# Patient Record
Sex: Male | Born: 1940
Health system: Southern US, Community
[De-identification: ages and names within clinical notes are randomized; demographics above are authoritative.]

## PROBLEM LIST (undated history)

## (undated) DIAGNOSIS — F419 Anxiety disorder, unspecified: Secondary | ICD-10-CM

## (undated) DIAGNOSIS — Z9889 Other specified postprocedural states: Secondary | ICD-10-CM

## (undated) DIAGNOSIS — C61 Malignant neoplasm of prostate: Secondary | ICD-10-CM

## (undated) DIAGNOSIS — Z8679 Personal history of other diseases of the circulatory system: Secondary | ICD-10-CM

## (undated) DIAGNOSIS — M1990 Unspecified osteoarthritis, unspecified site: Secondary | ICD-10-CM

## (undated) DIAGNOSIS — I509 Heart failure, unspecified: Secondary | ICD-10-CM

## (undated) DIAGNOSIS — E669 Obesity, unspecified: Secondary | ICD-10-CM

## (undated) DIAGNOSIS — I428 Other cardiomyopathies: Secondary | ICD-10-CM

## (undated) DIAGNOSIS — I2510 Atherosclerotic heart disease of native coronary artery without angina pectoris: Secondary | ICD-10-CM

## (undated) DIAGNOSIS — E785 Hyperlipidemia, unspecified: Secondary | ICD-10-CM

## (undated) DIAGNOSIS — I4891 Unspecified atrial fibrillation: Principal | ICD-10-CM

## (undated) DIAGNOSIS — R911 Solitary pulmonary nodule: Secondary | ICD-10-CM

## (undated) DIAGNOSIS — R0602 Shortness of breath: Secondary | ICD-10-CM

## (undated) DIAGNOSIS — I700 Atherosclerosis of aorta: Secondary | ICD-10-CM

## (undated) DIAGNOSIS — I499 Cardiac arrhythmia, unspecified: Secondary | ICD-10-CM

## (undated) DIAGNOSIS — I5042 Chronic combined systolic (congestive) and diastolic (congestive) heart failure: Secondary | ICD-10-CM

## (undated) DIAGNOSIS — I10 Essential (primary) hypertension: Secondary | ICD-10-CM

## (undated) DIAGNOSIS — K219 Gastro-esophageal reflux disease without esophagitis: Secondary | ICD-10-CM

## (undated) DIAGNOSIS — N189 Chronic kidney disease, unspecified: Secondary | ICD-10-CM

## (undated) HISTORY — DX: Unspecified atrial fibrillation: I48.91

## (undated) HISTORY — DX: Solitary pulmonary nodule: R91.1

## (undated) HISTORY — DX: Atherosclerosis of aorta: I70.0

## (undated) HISTORY — PX: MEDIAL PARTIAL KNEE REPLACEMENT: SHX5965

## (undated) HISTORY — DX: Other cardiomyopathies: I42.8

## (undated) HISTORY — DX: Hyperlipidemia, unspecified: E78.5

## (undated) HISTORY — DX: Atherosclerotic heart disease of native coronary artery without angina pectoris: I25.10

## (undated) HISTORY — DX: Chronic combined systolic (congestive) and diastolic (congestive) heart failure: I50.42

---

## 2003-02-11 ENCOUNTER — Encounter: Admission: RE | Admit: 2003-02-11 | Discharge: 2003-05-12 | Payer: Self-pay | Admitting: Family Medicine

## 2004-01-07 ENCOUNTER — Inpatient Hospital Stay (HOSPITAL_COMMUNITY): Admission: RE | Admit: 2004-01-07 | Discharge: 2004-01-09 | Payer: Self-pay | Admitting: Orthopedic Surgery

## 2007-01-27 ENCOUNTER — Emergency Department (HOSPITAL_COMMUNITY): Admission: EM | Admit: 2007-01-27 | Discharge: 2007-01-27 | Payer: Self-pay | Admitting: Emergency Medicine

## 2008-02-28 HISTORY — PX: TRANSURETHRAL RESECTION OF PROSTATE: SHX73

## 2008-03-10 ENCOUNTER — Encounter: Payer: Self-pay | Admitting: Urology

## 2008-03-11 ENCOUNTER — Inpatient Hospital Stay (HOSPITAL_COMMUNITY): Admission: RE | Admit: 2008-03-11 | Discharge: 2008-03-12 | Payer: Self-pay | Admitting: Urology

## 2008-05-30 ENCOUNTER — Emergency Department (HOSPITAL_COMMUNITY): Admission: EM | Admit: 2008-05-30 | Discharge: 2008-05-31 | Payer: Self-pay | Admitting: Emergency Medicine

## 2008-05-31 ENCOUNTER — Emergency Department (HOSPITAL_COMMUNITY): Admission: EM | Admit: 2008-05-31 | Discharge: 2008-05-31 | Payer: Self-pay | Admitting: Emergency Medicine

## 2010-06-08 LAB — URINALYSIS, ROUTINE W REFLEX MICROSCOPIC
Bilirubin Urine: NEGATIVE
Glucose, UA: NEGATIVE mg/dL
Ketones, ur: NEGATIVE mg/dL
Nitrite: NEGATIVE
Protein, ur: NEGATIVE mg/dL
Specific Gravity, Urine: 1.006 (ref 1.005–1.030)
Urobilinogen, UA: 0.2 mg/dL (ref 0.0–1.0)
pH: 6 (ref 5.0–8.0)

## 2010-06-08 LAB — URINE CULTURE
Colony Count: NO GROWTH
Culture: NO GROWTH

## 2010-06-08 LAB — URINE MICROSCOPIC-ADD ON

## 2010-06-13 LAB — BASIC METABOLIC PANEL
BUN: 10 mg/dL (ref 6–23)
BUN: 8 mg/dL (ref 6–23)
CO2: 30 mEq/L (ref 19–32)
CO2: 30 mEq/L (ref 19–32)
Calcium: 8.5 mg/dL (ref 8.4–10.5)
Calcium: 9.4 mg/dL (ref 8.4–10.5)
Chloride: 103 mEq/L (ref 96–112)
Chloride: 99 mEq/L (ref 96–112)
Creatinine, Ser: 0.9 mg/dL (ref 0.4–1.5)
Creatinine, Ser: 1.01 mg/dL (ref 0.4–1.5)
GFR calc Af Amer: >60 mL/min (ref >60)
GFR calc Af Amer: >60 mL/min (ref >60)
GFR calc non Af Amer: >60 mL/min (ref >60)
GFR calc non Af Amer: >60 mL/min (ref >60)
Glucose, Bld: 112 mg/dL — ABNORMAL HIGH (ref 70–99)
Glucose, Bld: 117 mg/dL — ABNORMAL HIGH (ref 70–99)
Potassium: 4.1 mEq/L (ref 3.5–5.1)
Potassium: 4.9 mEq/L (ref 3.5–5.1)
Sodium: 136 mEq/L (ref 135–145)
Sodium: 137 mEq/L (ref 135–145)

## 2010-06-13 LAB — HEMOGLOBIN AND HEMATOCRIT, BLOOD
HCT: 42.7 % (ref 39.0–52.0)
Hemoglobin: 14.9 g/dL (ref 13.0–17.0)

## 2010-07-12 NOTE — Op Note (Signed)
NAME:  Jose Snyder, Jose Snyder                  ACCOUNT NO.:  [id number]   MEDICAL RECORD NO.:  [id number]          PATIENT TYPE:  OIB   LOCATION:  1403                         FACILITY:  [facility]   PHYSICIAN:  Excell Seltzer. Annabell Howells, M.D.    DATE OF BIRTH:  24-Mar-1940   DATE OF PROCEDURE:  03/10/2008  DATE OF DISCHARGE:                               OPERATIVE REPORT   PROCEDURE:  Circumcision and transurethral resection of the prostate.   PREOPERATIVE DIAGNOSIS:  Phimosis and benign prostatic hypertrophy with  bladder obstruction.   POSTOPERATIVE DIAGNOSIS:  Phimosis and benign prostatic hypertrophy with  bladder obstruction.   SURGEON:  Dr. Bjorn Pippin.   ANESTHESIA:  Spinal.   SPECIMEN:  Prostate chips.   BLOOD LOSS:  500 mL.   DRAIN:  22-French three-way Foley catheter.   COMPLICATIONS:  None.   INDICATIONS:  Jose Snyder is a 70 year old white male with BPH with bladder  outlet obstruction who has failed medical therapy.  He also has severe  phimosis.  He is to undergo circumcision and TURP.   FINDINGS AND PROCEDURE:  The patient is given Cipro.  He was taken to  the operating room.  Spinal anesthetic was induced.  He was fitted with  PAS hose and placed in lithotomy position.  His perineum and genitalia  were prepped with Betadine solution.  He was draped in usual sterile  fashion.   A circumcision of was performed initially.  A dorsal slit was created  and the redundant wings of foreskin were excised.  Hemostasis was  achieved with a Bovie and the skin edges were reapproximated with  running 4-0 chromic locked in the quadrants.   Cystoscopy was performed with a 22-French scope.  Examination revealed a  normal urethra.  The external sphincter was intact.  The prostatic  urethra was elongated approximately 4 cm.  There was intravesical  extension.  Middle lobe was not clearly identified.  The bladder wall  had mild to moderate trabeculation.  No tumors or stones were noted.  Ureteral orifices  were unremarkable and well way from the bladder neck.   After completion of cystoscopy, the urethra was calibrated to 32-French  with Sissy Hoff sounds.  A 28-French continuous flow resectoscope sheath  with obturator was placed without difficulty.  This was fitted with  Wandra Scot handle 12 degrees lens and 26th loupe.  Resection of the  prostate was then performed beginning with the bladder neck where the  fibers were exposed from 5 to 7 o'clock.  The floor of the prostate was  then resected out to and alongside the verumontanum.  The left lobe of  the prostate was taken down from bladder neck to apex out to the  capsular fibers and the resection was then swept down to the floor.  This was then repeated on the right.  The chips were evacuated and some  residual apical and anterior tissue was then resected.  Once adequate  resection had been achieved and the bladder was evacuated free of chips,  the ureteral orifices were noted to be intact.  The prostatic  fossa was  inspected and no active bleeding was identified.  The scope was removed.  Pressure on the bladder produced an excellent stream.  A 22-French three-  way Foley catheter was inserted with the aid of a catheter guide.  The  balloon was filled with 30 mL sterile fluid.  The catheter was placed on  manual traction and irrigated by hand until clear.  It was then placed  to continuous irrigation and straight drainage.  Neosporin ointment was  applied to the circumcision site.  The patient had had some glanular  adhesions that require lysis during the procedure and had some raw areas  on the glans.  These were then covered by Xeroform gauze and a light  Kling dressing.  A penile block was then performed with 8 mL of 0.25%  Marcaine.  The patient was taken down from lithotomy position after the  drapes removed.  He was moved to the recovery room in stable condition.  There were no complications.      Excell Seltzer. Annabell Howells, M.D.   Electronically Signed     JJW/MEDQ  D:  03/10/2008  T:  03/10/2008  Job:  [id number]

## 2010-07-15 NOTE — Discharge Summary (Signed)
NAME:  Jose Snyder, Jose Snyder                  ACCOUNT NO.:  [id number]   MEDICAL RECORD NO.:  [id number]          PATIENT TYPE:  INP   LOCATION:  5018                         FACILITY:  [facility]   PHYSICIAN:  Loreta Ave, M.D. DATE OF BIRTH:  09/11/1940   DATE OF ADMISSION:  01/07/2004  DATE OF DISCHARGE:  01/09/2004                                 DISCHARGE SUMMARY   No dictation      RWC/MEDQ  D:  03/28/2004  T:  03/28/2004  Job:  [id number]

## 2010-07-15 NOTE — Op Note (Signed)
NAME:  Jose Snyder, Jose Snyder                  ACCOUNT NO.:  [id number]   MEDICAL RECORD NO.:  [id number]          PATIENT TYPE:  INP   LOCATION:  5018                         FACILITY:  [facility]   PHYSICIAN:  Loreta Ave, M.D. DATE OF BIRTH:  12-Feb-1941   DATE OF PROCEDURE:  01/07/2004  DATE OF DISCHARGE:                                 OPERATIVE REPORT   PREOPERATIVE DIAGNOSIS:  Avascular necrosis medial femoral condyle with  resultant degenerative arthritis, medial aspect, left knee, mild flexion  contracture and varus alignment.   POSTOPERATIVE DIAGNOSIS:  Avascular necrosis medial femoral condyle with  resultant degenerative arthritis, medial aspect, left knee, mild flexion  contracture and varus alignment with the other compartments free of  significant degenerative change.   OPERATIVE PROCEDURE:  Arthrotomy left knee followed by unicompartmental  replacement, medial compartment, Osteonix prosthesis, cemented large left  medial femoral component, cemented 8 mm medium tibial component, soft tissue  balancing with medial release.   SURGEON:  Loreta Ave, M.D.   ASSISTANT:  Genene Churn. Owens, P.A.-C.   ANESTHESIA:  General.   ESTIMATED BLOOD LOSS:  Minimal.   SPECIMENS:  Excised bone and soft tissue.   TOURNIQUET TIME:  2 hours.   DRAINS:  One Hemovac.   COMPLICATIONS:  None.   DRESSINGS:  Soft compressive with knee immobilizer.   PROCEDURE:  The patient was brought to the operating room and after adequate  anesthesia had been obtained, the left knee was examined.  Mild flexion  contracture, varus alignment, stable ligaments, full flexion.  Tourniquet  applied, prepped and draped in the usual sterile fashion.  Exsanguination  with elevation of Esmarch and tourniquet inflated to 350 mmHg.  An incision  from the top of the patella medial aspect down next to the tibial tubercle.  The skin and subcutaneous tissues were divided.  Medial parapatellar  arthrotomy.  Medial  capsule release.  Grade 4 changes with avascular  necrosis and collapsed medial femoral condyle.  Lateral compartment, lateral  meniscus, patellofemoral joint, all looked excellent.  We proceeded with  unicompartmental replacement.  Remnants of menisci were removed.  Medial  capsule release to balance the knee.  Tibial jig, extramedullary guide  placed, resecting 8 mm out medially.  Care taken not to under cut the tibial  eminence.  Sized for a medium component.  Femur was then addressed with the  appropriate femoral jigs.  Sized for a large component going anterior to the  tide mark.  Jigs put in place and the bed for the component prepared with  drills and burs.  When that was complete and all periarticular spurs were  removed, the knee was irrigated.  Posterior cut had been made in the femur  for the component and all posterior spurs were removed.  Trials were put in  place.  Large on the femur, medium on the tibia.  With the 8 mm tibial  insert, I had full extension, full flexion, nicely balanced knee, set at 5  degrees of valgus.  No lift off with flexion.  Tibia was marked for  placement and  reamed for the keel of the component.  Copious irrigation with  the pulse irrigating device.  Cement placed on all components which were  firmly seated.  Excess cement removed.  The knee was reduced.  Once the  cement hardened, the knee was re-examined.  Full extension, full flexion,  good stability, good tracking of all components.  The wound was irrigated.  Arthrotomy closed with #1 Vicryl, skin and subcutaneous tissue with Vicryl  and staples.  Margins of the wound and knee injected with Marcaine.  A  Hemovac had been placed before closure of the wound.  Sterile compressive  dressing applied.  Tourniquet deflated and removed.  Knee immobilizer  applied.  Anesthesia reversed.  Brought to the recovery room.  No  complications.      [name]   DFM/MEDQ  D:  01/07/2004  T:  01/07/2004  Job:   [id number]

## 2010-07-15 NOTE — Discharge Summary (Signed)
NAME:  Jose Snyder, Jose Snyder                  ACCOUNT NO.:  [id number]   MEDICAL RECORD NO.:  [id number]          PATIENT TYPE:  INP   LOCATION:  5018                         FACILITY:  [facility]   PHYSICIAN:  Loreta Ave, M.D. DATE OF BIRTH:  [birth year]   DATE OF ADMISSION:  01/07/2004  DATE OF DISCHARGE:  01/09/2004                                 DISCHARGE SUMMARY   DISCHARGE DIAGNOSES:  1.  Status post left medial knee unicompartmental arthroplasty for medial      compartment avascular necrosis/degenerative joint disease.  2.  Long-term use of anticoagulants.  3.  Hypertension.   LABORATORY DATA AND X-RAY FINDINGS:  Preadmission labs with WBC 6.9,  hemoglobin 15.5, hematocrit 43.7, platelets 345.  PT 13.0, INR 1.0, PTT 38.  Sodium 136, potassium 4.7, chloride 101, CO2 27, glucose 97, BUN 12,  creatinine 1.0, calcium 9.7.  Total protein 7.3, albumin 4.3, AST 23, ALT  28, Alk phos 91, total bilirubin 0.5.  UA negative.   HISTORY OF PRESENT ILLNESS:  This is a 70 year old, white male with a  history of left medial knee bone on bone changes with progressive worsening  of pain presenting to our office.  He had progressive worsening symptoms  with failed response to conservative treatment.  Significant decrease in his  daily activities due to the ongoing complaint.  Preop x-rays showed medial  compartment bone on bone changes with spurring of the lateral and  patellofemoral compartments.   HOSPITAL COURSE:  On January 07, 2004, the patient was taken to the [facility] Operating Room and a left, medial knee, unicompartmental arthroplasty  procedure performed.  Surgeon was Dr. Mckinley Jewel.  Anesthesia was  general.  EBL was minimal.  Tourniquet time was 2 hours.  One Hemovac drain  was placed.  There were no surgical or anesthesia complications.  The  patient was transferred to recovery in stable condition.  On January 08, 2004, the patient had good pain control.  No specific complaints  noted.  Temperature was 97.8, pulse 82, respirations 20, blood pressure 139/77.  Hemoglobin was 12.8, WBC 7.2 and glucose 126.  Dressing was clean, dry and  intact.  Hemovac drain was pulled.  PT consult was ordered.  Discontinued O2  and Foley.  Started pharmacy protocol on Coumadin.  On January 09, 2004,  the patient was doing well with no specific complaints.  Pain was well-  controlled.  Vital signs stable and afebrile.  Dressing was clean, dry and  intact.  The patient was doing well and ready for discharge home.   DISPOSITION:  Discharged to home.   DISCHARGE MEDICATIONS:  1.  Percocet one to two tablets p.o. q.4-6h. p.r.n. pain.  2.  Coumadin per pharmacy protocol.  3.  Resume pervious home medications.   CONDITION ON DISCHARGE:  Good and stable.   SPECIAL INSTRUCTIONS:  The patient will work with home health physical  therapy to improve the range of motion and strengthening.  These staples to  be removed 2 weeks postop.  Will continue Coumadin for DVT prophylaxis x3-4  weeks  postop.  Dressing changes p.r.n.   FOLLOW UP:  Will follow up in the office in 2 weeks for recheck.  Return  sooner if needed.      DFM/MEDQ  D:  04/04/2004  T:  04/04/2004  Job:  [id number]

## 2010-12-06 LAB — URINALYSIS, ROUTINE W REFLEX MICROSCOPIC
Bilirubin Urine: NEGATIVE
Glucose, UA: NEGATIVE
Hgb urine dipstick: NEGATIVE
Ketones, ur: NEGATIVE
Leukocytes, UA: NEGATIVE
Nitrite: NEGATIVE
Protein, ur: 30 — AB
Specific Gravity, Urine: 1.012
Urobilinogen, UA: 0.2
pH: 6.5

## 2010-12-06 LAB — URINE MICROSCOPIC-ADD ON

## 2011-07-19 DIAGNOSIS — C61 Malignant neoplasm of prostate: Secondary | ICD-10-CM | POA: Diagnosis not present

## 2011-07-26 DIAGNOSIS — C61 Malignant neoplasm of prostate: Secondary | ICD-10-CM | POA: Diagnosis not present

## 2011-07-26 DIAGNOSIS — N476 Balanoposthitis: Secondary | ICD-10-CM | POA: Diagnosis not present

## 2011-11-23 DIAGNOSIS — Z23 Encounter for immunization: Secondary | ICD-10-CM | POA: Diagnosis not present

## 2012-01-22 DIAGNOSIS — C61 Malignant neoplasm of prostate: Secondary | ICD-10-CM | POA: Diagnosis not present

## 2012-01-29 DIAGNOSIS — N476 Balanoposthitis: Secondary | ICD-10-CM | POA: Diagnosis not present

## 2012-01-29 DIAGNOSIS — C61 Malignant neoplasm of prostate: Secondary | ICD-10-CM | POA: Diagnosis not present

## 2012-07-29 DIAGNOSIS — C61 Malignant neoplasm of prostate: Secondary | ICD-10-CM | POA: Diagnosis not present

## 2012-08-05 DIAGNOSIS — C61 Malignant neoplasm of prostate: Secondary | ICD-10-CM | POA: Diagnosis not present

## 2012-08-05 DIAGNOSIS — R972 Elevated prostate specific antigen [PSA]: Secondary | ICD-10-CM | POA: Diagnosis not present

## 2012-08-05 DIAGNOSIS — N476 Balanoposthitis: Secondary | ICD-10-CM | POA: Diagnosis not present

## 2012-08-05 DIAGNOSIS — N529 Male erectile dysfunction, unspecified: Secondary | ICD-10-CM | POA: Diagnosis not present

## 2012-12-11 DIAGNOSIS — Z23 Encounter for immunization: Secondary | ICD-10-CM | POA: Diagnosis not present

## 2013-01-01 ENCOUNTER — Ambulatory Visit: Payer: Medicare Other | Admitting: Podiatry

## 2013-01-01 DIAGNOSIS — B351 Tinea unguium: Secondary | ICD-10-CM

## 2013-01-01 DIAGNOSIS — M79609 Pain in unspecified limb: Secondary | ICD-10-CM

## 2013-01-01 NOTE — Progress Notes (Signed)
Subjective:     Patient ID: Jose Snyder, male   DOB: [birth year], 72 y.o.   MRN: [id number]  HPI patient states I have a lot of nail disease they are crumbled thick and they bother me. I will do anything to try to get him better I have tried oral medicine in the past without success   Review of Systems  All other systems reviewed and are negative.       Objective:   Physical Exam  Nursing note and vitals reviewed. Constitutional: He is oriented to person, place, and time.  Cardiovascular: Intact distal pulses.   Musculoskeletal: Normal range of motion.  Neurological: He is oriented to person, place, and time.  Skin: Skin is warm.   patient is found to have thick crumbled nails 1-5 both feet with pain and deformity of the beds noted     Assessment:     Mycotic nail infection with pain 1-5 both feet    Plan:     H&P done in condition reviewed today I debrided the nailbeds and discussed topical treatment with formula 3 dispensed. Do not recommend oral therapy at the time patient will reappoint in 3 months

## 2013-01-01 NOTE — Progress Notes (Signed)
N-THICK, DISCOLORATION L-B/L TOENAILS D-3 YEARS O-SLOWLY C-WORSE A-N/A T-TRIM

## 2013-01-01 NOTE — Progress Notes (Signed)
  Subjective:    Patient ID: Jose Snyder, male    DOB: Sep 01, 1940, 72 y.o.   MRN: [id number]  HPI    Review of Systems  All other systems reviewed and are negative.       Objective:   Physical Exam        Assessment & Plan:

## 2013-01-01 NOTE — Patient Instructions (Signed)
Use topical once a day

## 2013-02-03 DIAGNOSIS — C61 Malignant neoplasm of prostate: Secondary | ICD-10-CM | POA: Diagnosis not present

## 2013-02-10 DIAGNOSIS — N529 Male erectile dysfunction, unspecified: Secondary | ICD-10-CM | POA: Diagnosis not present

## 2013-02-10 DIAGNOSIS — N476 Balanoposthitis: Secondary | ICD-10-CM | POA: Diagnosis not present

## 2013-02-10 DIAGNOSIS — C61 Malignant neoplasm of prostate: Secondary | ICD-10-CM | POA: Diagnosis not present

## 2013-02-22 ENCOUNTER — Emergency Department (HOSPITAL_BASED_OUTPATIENT_CLINIC_OR_DEPARTMENT_OTHER)
Admission: EM | Admit: 2013-02-22 | Discharge: 2013-02-22 | Disposition: A | Discharge status: Home / Self Care | Payer: Medicare Other | Attending: Emergency Medicine | Admitting: Emergency Medicine

## 2013-02-22 ENCOUNTER — Emergency Department (HOSPITAL_BASED_OUTPATIENT_CLINIC_OR_DEPARTMENT_OTHER): Payer: Medicare Other

## 2013-02-22 ENCOUNTER — Encounter (HOSPITAL_BASED_OUTPATIENT_CLINIC_OR_DEPARTMENT_OTHER): Payer: Self-pay | Admitting: Emergency Medicine

## 2013-02-22 DIAGNOSIS — R509 Fever, unspecified: Secondary | ICD-10-CM | POA: Insufficient documentation

## 2013-02-22 DIAGNOSIS — Z8546 Personal history of malignant neoplasm of prostate: Secondary | ICD-10-CM | POA: Insufficient documentation

## 2013-02-22 DIAGNOSIS — J019 Acute sinusitis, unspecified: Secondary | ICD-10-CM | POA: Diagnosis not present

## 2013-02-22 DIAGNOSIS — Z79899 Other long term (current) drug therapy: Secondary | ICD-10-CM | POA: Diagnosis not present

## 2013-02-22 DIAGNOSIS — I10 Essential (primary) hypertension: Secondary | ICD-10-CM | POA: Diagnosis not present

## 2013-02-22 DIAGNOSIS — J069 Acute upper respiratory infection, unspecified: Secondary | ICD-10-CM | POA: Diagnosis not present

## 2013-02-22 DIAGNOSIS — R6883 Chills (without fever): Secondary | ICD-10-CM | POA: Diagnosis not present

## 2013-02-22 DIAGNOSIS — R05 Cough: Secondary | ICD-10-CM | POA: Diagnosis not present

## 2013-02-22 HISTORY — DX: Essential (primary) hypertension: I10

## 2013-02-22 HISTORY — DX: Malignant neoplasm of prostate: C61

## 2013-02-22 MED ORDER — AMOXICILLIN 500 MG PO CAPS: 500.0000 mg | ORAL_CAPSULE | Freq: Three times a day (TID) | ORAL | Status: DC

## 2013-02-22 NOTE — ED Notes (Signed)
Patient here with cough, congestion, general aching and chills x 4 days. No shortness of breath, no extremity swelling.

## 2013-02-22 NOTE — ED Notes (Signed)
MD at bedside. 

## 2013-02-22 NOTE — ED Notes (Signed)
Patient transported to X-ray 

## 2013-02-22 NOTE — ED Provider Notes (Signed)
CSN: [id number]     Arrival date & time 02/22/13  0907 History   First MD Initiated Contact with Patient 02/22/13 [phone]     Chief Complaint  Patient presents with  . Nasal Congestion  . Cough   (Consider location/radiation/quality/duration/timing/severity/associated sxs/prior Treatment) HPI Comments: Patient is a 72 year old male with past medical history of hypertension. Presents today with a five-day history of chest congestion, cough, nasal congestion, and sinus pain. He has felt fevered intermittently. He denies chest pain or shortness of breath.  Patient is a 72 y.o. male presenting with cough. The history is provided by the patient.  Cough Cough characteristics:  Non-productive Severity:  Moderate Onset quality:  Gradual Duration:  5 days Timing:  Constant Progression:  Worsening Chronicity:  New Smoker: no   Relieved by:  Nothing Worsened by:  Nothing tried Ineffective treatments:  None tried   Past Medical History  Diagnosis Date  . Hypertension   . Prostate cancer    History reviewed. No pertinent past surgical history. No family history on file. History  Substance Use Topics  . Smoking status: Never Smoker   . Smokeless tobacco: Not on file  . Alcohol Use: Not on file    Review of Systems  Respiratory: Positive for cough.   All other systems reviewed and are negative.    Allergies  Review of patient's allergies indicates no known allergies.  Home Medications   Current Outpatient Rx  Name  Route  Sig  Dispense  Refill  . amLODipine (NORVASC) 10 MG tablet   Oral   Take 10 mg by mouth daily.         . flunisolide (NASALIDE) 25 MCG/ACT (0.025%) SOLN   Nasal   Place 2 sprays into the nose 2 (two) times daily.         . hydrochlorothiazide (MICROZIDE) 12.5 MG capsule   Oral   Take 12.5 mg by mouth daily.         Marland Kitchen ibuprofen (ADVIL,MOTRIN) 800 MG tablet   Oral   Take 800 mg by mouth every 8 (eight) hours as needed.         Marland Kitchen omeprazole  (PRILOSEC) 20 MG capsule   Oral   Take 20 mg by mouth daily.         Marland Kitchen zolpidem (AMBIEN) 10 MG tablet   Oral   Take 10 mg by mouth at bedtime as needed for sleep.         Marland Kitchen docusate sodium (COLACE) 100 MG capsule   Oral   Take 100 mg by mouth 2 (two) times daily.          BP 102/62  Pulse 108  Temp(Src) 97.5 F (36.4 C) (Oral)  Resp 18  Ht 6\' 2"  (1.88 m)  Wt 240 lb (108.863 kg)  BMI 30.80 kg/m2  SpO2 100% Physical Exam  Nursing note and vitals reviewed. Constitutional: He is oriented to person, place, and time. He appears well-developed and well-nourished. No distress.  HENT:  Head: Normocephalic and atraumatic.  Mouth/Throat: Oropharynx is clear and moist.  Bilateral TMs are clear.  Neck: Normal range of motion. Neck supple.  Cardiovascular: Normal rate, regular rhythm and normal heart sounds.   No murmur heard. Pulmonary/Chest: Effort normal and breath sounds normal. No respiratory distress. He has no wheezes.  Abdominal: Soft. Bowel sounds are normal.  Musculoskeletal: Normal range of motion. He exhibits no edema.  Lymphadenopathy:    He has no cervical adenopathy.  Neurological: He is alert  and oriented to person, place, and time.  Skin: Skin is warm and dry. He is not diaphoretic.    ED Course  Procedures (including critical care time) Labs Review Labs Reviewed - No data to display Imaging Review Dg Chest 2 View  02/22/2013   CLINICAL DATA:  Cough and chills  EXAM: CHEST  2 VIEW  COMPARISON:  January 04, 2004  FINDINGS: The lungs are clear. The heart size and pulmonary vascularity are normal. No adenopathy. There is mild degenerative change in the thoracic spine.  IMPRESSION: No edema or consolidation.   Electronically Signed   By: Bretta Bang M.D.   On: 02/22/2013 09:39    EKG Interpretation   None       MDM  No diagnosis found. Patient reports sinus congestion and is convinced he has a sinus infection. I discussed with him that this is  most likely viral in nature however he is confident he will require an antibiotic. I will prescribe amoxicillin and is to followup as needed for any problems. His chest x-ray is clear and shows no evidence for pneumonia.    Geoffery Lyons, MD 02/22/13 1001

## 2013-04-09 ENCOUNTER — Ambulatory Visit: Payer: Medicare Other | Admitting: Podiatry

## 2013-04-09 ENCOUNTER — Encounter: Payer: Self-pay | Admitting: Podiatry

## 2013-04-09 VITALS — BP 156/96 | HR 108 | Resp 16

## 2013-04-09 DIAGNOSIS — B351 Tinea unguium: Secondary | ICD-10-CM

## 2013-04-09 DIAGNOSIS — M79609 Pain in unspecified limb: Secondary | ICD-10-CM

## 2013-04-11 NOTE — Progress Notes (Signed)
Subjective:     Patient ID: Jose Snyder, male   DOB: [birth year], 73 y.o.   MRN: [id number]  HPI patient states nails are thick 1-5 both feet are responding well to topical antifungal. Still painful and he cannot cut   Review of Systems     Objective:   Physical Exam Mycotic nail infection with pain 1-5 both feet that are somewhat thinner than previous visit    Assessment:     Chronic mycotic infection with pain 1-5 both feet    Plan:     Continue formula 3 and debridement nailbeds 1-5 both feet with no bleeding noted

## 2013-05-12 DIAGNOSIS — I781 Nevus, non-neoplastic: Secondary | ICD-10-CM | POA: Diagnosis not present

## 2013-05-12 DIAGNOSIS — L570 Actinic keratosis: Secondary | ICD-10-CM | POA: Diagnosis not present

## 2013-05-12 DIAGNOSIS — L723 Sebaceous cyst: Secondary | ICD-10-CM | POA: Diagnosis not present

## 2013-06-25 ENCOUNTER — Emergency Department (HOSPITAL_BASED_OUTPATIENT_CLINIC_OR_DEPARTMENT_OTHER): Payer: Medicare Other

## 2013-06-25 ENCOUNTER — Inpatient Hospital Stay (HOSPITAL_BASED_OUTPATIENT_CLINIC_OR_DEPARTMENT_OTHER)
Admission: EM | Admit: 2013-06-25 | Discharge: 2013-07-01 | DRG: 286 | Disposition: A | Discharge status: Home / Self Care | Payer: Medicare Other | Attending: Cardiovascular Disease | Admitting: Cardiovascular Disease

## 2013-06-25 ENCOUNTER — Encounter (HOSPITAL_BASED_OUTPATIENT_CLINIC_OR_DEPARTMENT_OTHER): Payer: Self-pay | Admitting: Emergency Medicine

## 2013-06-25 DIAGNOSIS — K219 Gastro-esophageal reflux disease without esophagitis: Secondary | ICD-10-CM | POA: Diagnosis not present

## 2013-06-25 DIAGNOSIS — I429 Cardiomyopathy, unspecified: Secondary | ICD-10-CM

## 2013-06-25 DIAGNOSIS — I4891 Unspecified atrial fibrillation: Secondary | ICD-10-CM | POA: Diagnosis not present

## 2013-06-25 DIAGNOSIS — I428 Other cardiomyopathies: Secondary | ICD-10-CM | POA: Diagnosis not present

## 2013-06-25 DIAGNOSIS — I10 Essential (primary) hypertension: Secondary | ICD-10-CM | POA: Diagnosis present

## 2013-06-25 DIAGNOSIS — Z6831 Body mass index (BMI) 31.0-31.9, adult: Secondary | ICD-10-CM

## 2013-06-25 DIAGNOSIS — R079 Chest pain, unspecified: Secondary | ICD-10-CM

## 2013-06-25 DIAGNOSIS — N179 Acute kidney failure, unspecified: Secondary | ICD-10-CM | POA: Diagnosis present

## 2013-06-25 DIAGNOSIS — C61 Malignant neoplasm of prostate: Secondary | ICD-10-CM | POA: Diagnosis not present

## 2013-06-25 DIAGNOSIS — I5041 Acute combined systolic (congestive) and diastolic (congestive) heart failure: Secondary | ICD-10-CM | POA: Diagnosis not present

## 2013-06-25 DIAGNOSIS — R7309 Other abnormal glucose: Secondary | ICD-10-CM | POA: Diagnosis present

## 2013-06-25 DIAGNOSIS — E669 Obesity, unspecified: Secondary | ICD-10-CM | POA: Diagnosis not present

## 2013-06-25 DIAGNOSIS — R0609 Other forms of dyspnea: Secondary | ICD-10-CM

## 2013-06-25 DIAGNOSIS — I2510 Atherosclerotic heart disease of native coronary artery without angina pectoris: Secondary | ICD-10-CM | POA: Diagnosis present

## 2013-06-25 DIAGNOSIS — F411 Generalized anxiety disorder: Secondary | ICD-10-CM | POA: Diagnosis not present

## 2013-06-25 DIAGNOSIS — I509 Heart failure, unspecified: Secondary | ICD-10-CM | POA: Diagnosis present

## 2013-06-25 DIAGNOSIS — R0602 Shortness of breath: Secondary | ICD-10-CM | POA: Diagnosis not present

## 2013-06-25 DIAGNOSIS — I059 Rheumatic mitral valve disease, unspecified: Secondary | ICD-10-CM | POA: Diagnosis not present

## 2013-06-25 DIAGNOSIS — E876 Hypokalemia: Secondary | ICD-10-CM | POA: Diagnosis not present

## 2013-06-25 DIAGNOSIS — Z96659 Presence of unspecified artificial knee joint: Secondary | ICD-10-CM | POA: Diagnosis not present

## 2013-06-25 DIAGNOSIS — Z8546 Personal history of malignant neoplasm of prostate: Secondary | ICD-10-CM | POA: Diagnosis not present

## 2013-06-25 DIAGNOSIS — I5042 Chronic combined systolic (congestive) and diastolic (congestive) heart failure: Secondary | ICD-10-CM | POA: Diagnosis present

## 2013-06-25 DIAGNOSIS — R0600 Dyspnea, unspecified: Secondary | ICD-10-CM | POA: Diagnosis present

## 2013-06-25 HISTORY — DX: Obesity, unspecified: E66.9

## 2013-06-25 HISTORY — DX: Gastro-esophageal reflux disease without esophagitis: K21.9

## 2013-06-25 HISTORY — DX: Unspecified atrial fibrillation: I48.91

## 2013-06-25 LAB — CBC WITH DIFFERENTIAL/PLATELET
Basophils Absolute: 0 10*3/uL (ref 0.0–0.1)
Basophils Relative: 0 % (ref 0–1)
Eosinophils Absolute: 0 10*3/uL (ref 0.0–0.7)
Eosinophils Relative: 0 % (ref 0–5)
HCT: 44 % (ref 39.0–52.0)
Hemoglobin: 15.1 g/dL (ref 13.0–17.0)
Lymphocytes Relative: 10 % — ABNORMAL LOW (ref 12–46)
Lymphs Abs: 0.8 10*3/uL (ref 0.7–4.0)
MCH: 32.1 pg (ref 26.0–34.0)
MCHC: 34.3 g/dL (ref 30.0–36.0)
MCV: 93.4 fL (ref 78.0–100.0)
Monocytes Absolute: 0.3 10*3/uL (ref 0.1–1.0)
Monocytes Relative: 4 % (ref 3–12)
Neutro Abs: 7 10*3/uL (ref 1.7–7.7)
Neutrophils Relative %: 85 % — ABNORMAL HIGH (ref 43–77)
Platelets: 263 10*3/uL (ref 150–400)
RBC: 4.71 MIL/uL (ref 4.22–5.81)
RDW: 13.6 % (ref 11.5–15.5)
WBC: 8.2 10*3/uL (ref 4.0–10.5)

## 2013-06-25 LAB — TROPONIN I
Troponin I: <0.3 ng/mL (ref <0.30)
Troponin I: <0.3 ng/mL (ref <0.30)
Troponin I: <0.3 ng/mL (ref <0.30)

## 2013-06-25 LAB — MAGNESIUM: MAGNESIUM: 1.7 mg/dL (ref 1.5–2.5)

## 2013-06-25 LAB — URINALYSIS, ROUTINE W REFLEX MICROSCOPIC
Glucose, UA: NEGATIVE mg/dL
Hgb urine dipstick: NEGATIVE
Ketones, ur: NEGATIVE mg/dL
Leukocytes, UA: NEGATIVE
Nitrite: NEGATIVE
Protein, ur: NEGATIVE mg/dL
Specific Gravity, Urine: 1.025 (ref 1.005–1.030)
Urobilinogen, UA: 0.2 mg/dL (ref 0.0–1.0)
pH: 5 (ref 5.0–8.0)

## 2013-06-25 LAB — HEPARIN LEVEL (UNFRACTIONATED): Heparin Unfractionated: 0.1 IU/mL — ABNORMAL LOW (ref 0.30–0.70)

## 2013-06-25 LAB — PROTIME-INR
INR: 1.32 (ref 0.00–1.49)
Prothrombin Time: 16.1 seconds — ABNORMAL HIGH (ref 11.6–15.2)

## 2013-06-25 LAB — BASIC METABOLIC PANEL
BUN: 22 mg/dL (ref 6–23)
CO2: 24 mEq/L (ref 19–32)
Calcium: 9.5 mg/dL (ref 8.4–10.5)
Chloride: 98 mEq/L (ref 96–112)
Creatinine, Ser: 1.4 mg/dL — ABNORMAL HIGH (ref 0.50–1.35)
GFR calc Af Amer: 56 mL/min — ABNORMAL LOW (ref >90)
GFR calc non Af Amer: 49 mL/min — ABNORMAL LOW (ref >90)
Glucose, Bld: 211 mg/dL — ABNORMAL HIGH (ref 70–99)
Potassium: 4.3 mEq/L (ref 3.7–5.3)
Sodium: 136 mEq/L — ABNORMAL LOW (ref 137–147)

## 2013-06-25 LAB — TSH: TSH: 1.59 u[IU]/mL (ref 0.350–4.500)

## 2013-06-25 LAB — PRO B NATRIURETIC PEPTIDE
Pro B Natriuretic peptide (BNP): 2668 pg/mL — ABNORMAL HIGH (ref 0–125)
Pro B Natriuretic peptide (BNP): 2184 pg/mL — ABNORMAL HIGH (ref 0–125)

## 2013-06-25 MED ORDER — DILTIAZEM HCL 100 MG IV SOLR
10.0000 mg/h | INTRAVENOUS | Status: DC
  Administered 2013-06-25: 5 mg/h via INTRAVENOUS
  Administered 2013-06-25 – 2013-06-26 (×3): 10 mg/h via INTRAVENOUS
  Filled 2013-06-25 (×3): qty 100

## 2013-06-25 MED ORDER — ASPIRIN 81 MG PO CHEW
324.0000 mg | CHEWABLE_TABLET | Freq: Once | ORAL | Status: AC
  Administered 2013-06-25: 324 mg via ORAL

## 2013-06-25 MED ORDER — ASPIRIN 81 MG PO CHEW
CHEWABLE_TABLET | ORAL | Status: AC
  Administered 2013-06-25: 324 mg via ORAL
  Filled 2013-06-25: qty 4

## 2013-06-25 MED ORDER — HEPARIN (PORCINE) IN NACL 100-0.45 UNIT/ML-% IJ SOLN
1800.0000 [IU]/h | INTRAMUSCULAR | Status: DC
  Administered 2013-06-25: 1150 [IU]/h via INTRAVENOUS
  Administered 2013-06-26: 1600 [IU]/h via INTRAVENOUS
  Filled 2013-06-25 (×7): qty 250

## 2013-06-25 MED ORDER — ZOLPIDEM TARTRATE 5 MG PO TABS
5.0000 mg | ORAL_TABLET | Freq: Every evening | ORAL | Status: DC | PRN
  Administered 2013-06-25: 5 mg via ORAL
  Filled 2013-06-25: qty 1

## 2013-06-25 MED ORDER — DOCUSATE SODIUM 100 MG PO CAPS
100.0000 mg | ORAL_CAPSULE | Freq: Two times a day (BID) | ORAL | Status: DC
  Administered 2013-06-25 – 2013-07-01 (×11): 100 mg via ORAL
  Filled 2013-06-25 (×13): qty 1

## 2013-06-25 MED ORDER — PANTOPRAZOLE SODIUM 40 MG PO TBEC
40.0000 mg | DELAYED_RELEASE_TABLET | Freq: Every day | ORAL | Status: DC
  Administered 2013-06-26 – 2013-07-01 (×6): 40 mg via ORAL
  Filled 2013-06-25 (×6): qty 1

## 2013-06-25 MED ORDER — FLUTICASONE PROPIONATE 50 MCG/ACT NA SUSP
2.0000 | Freq: Every day | NASAL | Status: DC
  Administered 2013-06-25 – 2013-06-30 (×6): 2 via NASAL
  Filled 2013-06-25: qty 16

## 2013-06-25 MED ORDER — FLUNISOLIDE 25 MCG/ACT (0.025%) NA SOLN
2.0000 | Freq: Two times a day (BID) | NASAL | Status: DC
  Filled 2013-06-25: qty 25

## 2013-06-25 MED ORDER — NITROGLYCERIN 2 % TD OINT
TOPICAL_OINTMENT | TRANSDERMAL | Status: AC
  Administered 2013-06-25: 1 [in_us]
  Filled 2013-06-25: qty 1

## 2013-06-25 MED ORDER — REGADENOSON 0.4 MG/5ML IV SOLN
0.4000 mg | Freq: Once | INTRAVENOUS | Status: AC
  Administered 2013-06-26: 0.4 mg via INTRAVENOUS
  Filled 2013-06-25: qty 5

## 2013-06-25 MED ORDER — HEPARIN BOLUS VIA INFUSION
4000.0000 [IU] | Freq: Once | INTRAVENOUS | Status: AC
  Administered 2013-06-25: 4000 [IU] via INTRAVENOUS
  Filled 2013-06-25: qty 4000

## 2013-06-25 MED ORDER — AMLODIPINE BESYLATE 10 MG PO TABS: 10.0000 mg | ORAL_TABLET | Freq: Every day | ORAL | Status: DC

## 2013-06-25 MED ORDER — HYDROCHLOROTHIAZIDE 12.5 MG PO CAPS: 12.5000 mg | ORAL_CAPSULE | Freq: Every day | ORAL | Status: DC

## 2013-06-25 NOTE — H&P (Signed)
Patient ID: Jose Snyder MRN: [id number], DOB/AGE: 72-Jan-1942   Admit date: 06/25/2013   Primary Physician: Verline Lema, MD - at the [facility] Primary Cardiologist: New  Pt. Profile:  Jose Snyder is a 73 y.o. male with a history of hypertension, obesity, prostate cancer and no past cardiac history who presented to [place] today complaining of worsening DOE and chest tightness x 1 week. He was found to be in atrial fibrillation with RVR (HR 150s), started on a diltiazem drip, and transferred to [facility] for further care.  Jose Snyder is a very active man. He mows his lawn and does yard work routinely. He reports a one-week history of worsening dyspnea on exertion; on Saturday it got to the point that he couldn't go to his mailbox without stopping to catch his breath. He waited to be seen because he thought it was due to allergies and congestion. He has associated exertional chest discomfort which he describes as a tightness across his chest associated with SOB and relieved with rest. He has no radiation or diaphoresis. Currently he is chest pain-free.  He denies a history of heart disease or a family history of heart disease. He denies any palpitations, lightheadedness, n/v or lower extremity edema. He denies orthopnea but does report symptoms consistent with PND. He has never smoked and drinks occasionally. No history of bleeding. No previous TIAs or CVAs. No recent fevers, chills. He has lost 3 lbs in the last month.     Problem List  Past Medical History  Diagnosis Date  . Hypertension   . Prostate cancer     Past Surgical History  Procedure Laterality Date  . Medial partial knee replacement    . Transurethral resection of prostate       Allergies  No Known Allergies  Home Medications  Prior to Admission medications   Medication Sig Start Date End Date Taking? Authorizing Provider  amLODipine (NORVASC) 10 MG tablet Take 10 mg by mouth daily.    Historical Provider,  MD  amoxicillin (AMOXIL) 500 MG capsule Take 1 capsule (500 mg total) by mouth 3 (three) times daily. 02/22/13   Veryl Speak, MD  docusate sodium (COLACE) 100 MG capsule Take 100 mg by mouth 2 (two) times daily.    Historical Provider, MD  flunisolide (NASALIDE) 25 MCG/ACT (0.025%) SOLN Place 2 sprays into the nose 2 (two) times daily.    Historical Provider, MD  hydrochlorothiazide (MICROZIDE) 12.5 MG capsule Take 12.5 mg by mouth daily.    Historical Provider, MD  ibuprofen (ADVIL,MOTRIN) 800 MG tablet Take 800 mg by mouth every 8 (eight) hours as needed.    Historical Provider, MD  omeprazole (PRILOSEC) 20 MG capsule Take 20 mg by mouth daily.    Historical Provider, MD  zolpidem (AMBIEN) 10 MG tablet Take 10 mg by mouth at bedtime as needed for sleep.    Historical Provider, MD    Family History  No family history on file.  Social History  History   Social History  . Marital Status: Married    Spouse Name: N/A    Number of Children: N/A  . Years of Education: N/A   Occupational History  . Not on file.   Social History Main Topics  . Smoking status: Never Smoker   . Smokeless tobacco: Not on file  . Alcohol Use: 0.6 oz/week    1 Glasses of wine per week     Comment: 1 glass wine every other  day, more recently  . Drug Use: No  . Sexual Activity: Not on file   Other Topics Concern  . Not on file   Social History Narrative  . No narrative on file     Review of Systems General:  No chills, fever, night sweats or weight changes.  Cardiovascular:  No chest pain, dyspnea on exertion, edema, orthopnea, palpitations, paroxysmal nocturnal dyspnea. Dermatological: No rash, lesions/masses Respiratory: No cough, dyspnea Urologic: No hematuria, dysuria Abdominal:   No nausea, vomiting, diarrhea, bright red blood per rectum, melena, or hematemesis Neurologic:  No visual changes, wkns, changes in mental status. All other systems reviewed and are otherwise negative except as  noted above.  Physical Exam  Blood pressure 126/67, pulse 115, temperature 97.9 F (36.6 C), temperature source Oral, resp. rate 18, height 6\' 2"  (1.88 m), weight 247 lb (112.038 kg), SpO2 99.00%.  General: Pleasant, NAD Psych: Normal affect. Neuro: Alert and oriented X 3. Moves all extremities spontaneously. HEENT: Normal  Neck: Supple without bruits or JVD. Lungs:  Resp regular and unlabored, CTA. Heart: tachycardic irreg irreg no s3, s4, or murmurs. Abdomen: Soft, non-tender, non-distended, BS + x 4. protuberant Extremities: No clubbing, cyanosis or edema. DP/PT/Radials 2+ and equal bilaterally.  Labs   Recent Labs  06/25/13 0850  TROPONINI <0.30   Lab Results  Component Value Date   WBC 8.2 06/25/2013   HGB 15.1 06/25/2013   HCT 44.0 06/25/2013   MCV 93.4 06/25/2013   PLT 263 06/25/2013    Recent Labs Lab 06/25/13 0850  NA 136*  K 4.3  CL 98  CO2 24  BUN 22  CREATININE 1.40*  CALCIUM 9.5  GLUCOSE 211*    Radiology/Studies  Dg Chest Portable 1 View  06/25/2013   CLINICAL DATA:  Shortness of breath, fatigue.  EXAM: PORTABLE CHEST - 1 VIEW  COMPARISON:  02/22/2013  FINDINGS: Heart is upper limits normal in size. No confluent airspace opacities or effusions. No overt edema. No acute bony abnormality.  IMPRESSION: Borderline heart size.  No acute findings.     ECG  Atrial fibrillation with RVR HR 145  ASSESSMENT AND PLAN Jose Snyder is a 73 y.o. male with a history of hypertension, obesity, prostate cancer and no past cardiac history who presented to [place] today complaining of worsening DOE and chest tightness x 1 week. He was found to be in atrial fibrillation with RVR (HR 150s), started on a diltiazem drip, and transferred to [facility] for further care.  Atrial fibrillation with RVR- new onset. CHADvaSc score of 2 (HTN and age)  -- He was placed on a dilt gtt and nitro paste with symptomatic relief. On 5 will increase to 10 mg/hr. -- Heparin gtt -  will likely need long term anticoagulation -- ECHO to assess LV function pending -- TSH pending  -- HgA1c for risk stratification for anticoagulation -- Slightly elevated BNP- 2668, CXR clear  Chest pain- likely d/t atrial fibrillation but he has RFs for CAD.  --Troponin x1 negative, ECG with no acute ST or TW changes --Will admit to telemtry for observation -- Cycle Troponin and serial ECGs -- If rules out, NPO after midnight for tentative Lexiscan myovue in the morning for further risk stratification    AKI- creat 1.4, continue to monitor. Hold HCTZ  HTN- hold on Norvasc and HCTZ  Hyperglycemia- 211. Will check a HgA1c   Signed, Perry Mount, PA-C 06/25/2013, 12:32 PM  Pager [phone]  I have personally seen and  examined this patient with Perry Mount, PA-C. I agree with the assessment and plan as outlined above. New onset atrial fibrillation. He has likely been in atrial fib for last few weeks based on symptoms. He is not aware of an irregularity of his heart rhythm even now. Will continue Diltiazem drip (increase to 10mg /hour), start heparin, cycle cardiac markers. Echo later today or in am. He has also had chest pressure. Risk factors for CAD include age, HTN, obesity. Chest pain is likely due to atrial fib with RVR but will plan stress myoview in am to exclude ischemia. NPO at midnight.   Burnell Blanks 06/25/2013 1:29 PM

## 2013-06-25 NOTE — ED Notes (Signed)
MD at bedside. 

## 2013-06-25 NOTE — ED Provider Notes (Addendum)
CSN: [id number]     Arrival date & time 06/25/13  7782 History   First MD Initiated Contact with Patient 06/25/13 [phone]     Chief Complaint  Patient presents with  . Shortness of Breath     (Consider location/radiation/quality/duration/timing/severity/associated sxs/prior Treatment) HPI  This is a 73 year old male with a history of hypertension who presents with shortness of breath. Patient reports a one-week history of worsening dyspnea on exertion. He states that he has to sit down to catch his breath. He reports associated chest discomfort which he describes as a squeezing. Currently his chest discomfort is 2/10.  It is anterior and nonradiating.  Worse with ambulation. He denies a history of heart disease. He denies any palpitations. He denies any lower extremity swelling. He is not currently a smoker and has never smoked.  Denies history of atrial fibrillation.  Past Medical History  Diagnosis Date  . Hypertension   . Prostate cancer    Past Surgical History  Procedure Laterality Date  . Medial partial knee replacement    . Transurethral resection of prostate     No family history on file. History  Substance Use Topics  . Smoking status: Never Smoker   . Smokeless tobacco: Not on file  . Alcohol Use: 0.6 oz/week    1 Glasses of wine per week     Comment: 1 glass wine every other day, more recently    Review of Systems  Constitutional: Negative.  Negative for fever and chills.  Respiratory: Positive for chest tightness and shortness of breath. Negative for cough and wheezing.   Cardiovascular: Negative.  Negative for chest pain, palpitations and leg swelling.  Gastrointestinal: Negative.  Negative for nausea and abdominal pain.  Genitourinary: Negative.  Negative for dysuria.  Neurological: Negative for headaches.  All other systems reviewed and are negative.     Allergies  Review of patient's allergies indicates no known allergies.  Home Medications   Prior to  Admission medications   Medication Sig Start Date End Date Taking? Authorizing Provider  amLODipine (NORVASC) 10 MG tablet Take 10 mg by mouth daily.    Historical Provider, MD  amoxicillin (AMOXIL) 500 MG capsule Take 1 capsule (500 mg total) by mouth 3 (three) times daily. 02/22/13   Veryl Speak, MD  docusate sodium (COLACE) 100 MG capsule Take 100 mg by mouth 2 (two) times daily.    Historical Provider, MD  flunisolide (NASALIDE) 25 MCG/ACT (0.025%) SOLN Place 2 sprays into the nose 2 (two) times daily.    Historical Provider, MD  hydrochlorothiazide (MICROZIDE) 12.5 MG capsule Take 12.5 mg by mouth daily.    Historical Provider, MD  ibuprofen (ADVIL,MOTRIN) 800 MG tablet Take 800 mg by mouth every 8 (eight) hours as needed.    Historical Provider, MD  omeprazole (PRILOSEC) 20 MG capsule Take 20 mg by mouth daily.    Historical Provider, MD  zolpidem (AMBIEN) 10 MG tablet Take 10 mg by mouth at bedtime as needed for sleep.    Historical Provider, MD   BP 110/72  Pulse 117  Temp(Src) 97.8 F (36.6 C) (Oral)  Resp 20  Ht 6\' 2"  (1.88 m)  Wt 247 lb (112.038 kg)  BMI 31.70 kg/m2  SpO2 98% Physical Exam  Nursing note and vitals reviewed. Constitutional: He is oriented to person, place, and time. No distress.  HENT:  Head: Normocephalic and atraumatic.  Mucous membranes moist  Eyes: Pupils are equal, round, and reactive to light.  Neck: Neck supple. No  JVD present.  Cardiovascular: Normal heart sounds.   No murmur heard. Tachycardia, irregular rhythm  Pulmonary/Chest: Effort normal and breath sounds normal. No respiratory distress. He has no wheezes. He has no rales.  Abdominal: Soft. Bowel sounds are normal. There is no tenderness. There is no rebound.  Musculoskeletal: He exhibits no edema.  Lymphadenopathy:    He has no cervical adenopathy.  Neurological: He is alert and oriented to person, place, and time.  Skin: Skin is warm and dry.  Psychiatric: He has a normal mood and  affect.    ED Course  Procedures (including critical care time)  CRITICAL CARE Performed by: Merryl Hacker   Total critical care time: 35 min  Critical care time was exclusive of separately billable procedures and treating other patients.  Critical care was necessary to treat or prevent imminent or life-threatening deterioration.  Critical care was time spent personally by me on the following activities: development of treatment plan with patient and/or surrogate as well as nursing, discussions with consultants, evaluation of patient's response to treatment, examination of patient, obtaining history from patient or surrogate, ordering and performing treatments and interventions, ordering and review of laboratory studies, ordering and review of radiographic studies, pulse oximetry and re-evaluation of patient's condition.  Labs Review Labs Reviewed  CBC WITH DIFFERENTIAL - Abnormal; Notable for the following:    Neutrophils Relative % 85 (*)    Lymphocytes Relative 10 (*)    All other components within normal limits  BASIC METABOLIC PANEL - Abnormal; Notable for the following:    Sodium 136 (*)    Glucose, Bld 211 (*)    Creatinine, Ser 1.40 (*)    GFR calc non Af Amer 49 (*)    GFR calc Af Amer 56 (*)    All other components within normal limits  TROPONIN I  URINALYSIS, ROUTINE W REFLEX MICROSCOPIC  PRO B NATRIURETIC PEPTIDE    Imaging Review Dg Chest Portable 1 View  06/25/2013   CLINICAL DATA:  Shortness of breath, fatigue.  EXAM: PORTABLE CHEST - 1 VIEW  COMPARISON:  02/22/2013  FINDINGS: Heart is upper limits normal in size. No confluent airspace opacities or effusions. No overt edema. No acute bony abnormality.  IMPRESSION: Borderline heart size.  No acute findings.   Electronically Signed   By: Rolm Baptise M.D.   On: 06/25/2013 09:16     EKG Interpretation   Date/Time:  Wednesday June 25 2013 08:45:20 EDT Ventricular Rate:  145 PR Interval:    QRS Duration:  90 QT Interval:  320 QTC Calculation: 497 R Axis:   -131 Text Interpretation:  Atrial fibrillation with rapid ventricular response  Right superior axis deviation Pulmonary disease pattern Abnormal ECG Now  in atrial fibrillation when compared to prior Confirmed by HORTON  MD,  [place] ([id number]) on 06/25/2013 8:49:32 AM      MDM   Final diagnoses:  Atrial fibrillation with RVR    Patient presents with progressive dyspnea on exertion. Also reports squeezing chest pain. Risk factors for ACS including hypertension. Noted to be in atrial fibrillation with RVR with a rate of 150. Symptoms may be rate related. Patient was given aspirin. Nitroglycerin paste was placed and patient was started on a diltiazem drip. Chest x-ray shows borderline cardiomegaly. EKG is otherwise nonischemic. Troponin is negative. Discussed with Dr. Angelena Form.  Patient to be transferred to [facility] for further cardiology evaluation. On diltiazem drip, patient heart rate in the 110s. He is currently asymptomatic.  Merryl Hacker, MD 06/25/13 [place], MD 06/25/13 [phone]

## 2013-06-25 NOTE — ED Notes (Signed)
NTG paste removed- BP low.  EDP informed.  Pt reports breathing easier now.

## 2013-06-25 NOTE — ED Notes (Signed)
SHOB on exertion x 2 weeks and fatigue.

## 2013-06-25 NOTE — Progress Notes (Signed)
*  PRELIMINARY RESULTS* Echocardiogram 2D Echocardiogram has been performed.  Elvia Collum 06/25/2013, 4:18 PM

## 2013-06-26 ENCOUNTER — Inpatient Hospital Stay (HOSPITAL_COMMUNITY): Payer: Medicare Other

## 2013-06-26 DIAGNOSIS — R0609 Other forms of dyspnea: Secondary | ICD-10-CM

## 2013-06-26 DIAGNOSIS — R0989 Other specified symptoms and signs involving the circulatory and respiratory systems: Secondary | ICD-10-CM

## 2013-06-26 DIAGNOSIS — I5041 Acute combined systolic (congestive) and diastolic (congestive) heart failure: Secondary | ICD-10-CM

## 2013-06-26 DIAGNOSIS — I5042 Chronic combined systolic (congestive) and diastolic (congestive) heart failure: Secondary | ICD-10-CM | POA: Diagnosis present

## 2013-06-26 DIAGNOSIS — Z8546 Personal history of malignant neoplasm of prostate: Secondary | ICD-10-CM

## 2013-06-26 DIAGNOSIS — I428 Other cardiomyopathies: Secondary | ICD-10-CM

## 2013-06-26 DIAGNOSIS — R079 Chest pain, unspecified: Secondary | ICD-10-CM

## 2013-06-26 DIAGNOSIS — R0600 Dyspnea, unspecified: Secondary | ICD-10-CM | POA: Diagnosis present

## 2013-06-26 HISTORY — DX: Other cardiomyopathies: I42.8

## 2013-06-26 HISTORY — DX: Chronic combined systolic (congestive) and diastolic (congestive) heart failure: I50.42

## 2013-06-26 LAB — TROPONIN I

## 2013-06-26 LAB — BASIC METABOLIC PANEL
BUN: 23 mg/dL (ref 6–23)
CO2: 19 meq/L (ref 19–32)
CREATININE: 1.27 mg/dL (ref 0.50–1.35)
Calcium: 8.8 mg/dL (ref 8.4–10.5)
Chloride: 100 mEq/L (ref 96–112)
GFR calc non Af Amer: 55 mL/min — ABNORMAL LOW (ref >90)
GFR, EST AFRICAN AMERICAN: 63 mL/min — AB (ref >90)
Glucose, Bld: 134 mg/dL — ABNORMAL HIGH (ref 70–99)
POTASSIUM: 4 meq/L (ref 3.7–5.3)
SODIUM: 135 meq/L — AB (ref 137–147)

## 2013-06-26 LAB — LIPID PANEL
CHOL/HDL RATIO: 4.3 ratio
Cholesterol: 142 mg/dL (ref 0–200)
HDL: 33 mg/dL — AB (ref >39)
LDL CALC: 81 mg/dL (ref 0–99)
Triglycerides: 139 mg/dL (ref <150)
VLDL: 28 mg/dL (ref 0–40)

## 2013-06-26 LAB — PROTIME-INR
INR: 1.19 (ref 0.00–1.49)
PROTHROMBIN TIME: 14.8 s (ref 11.6–15.2)

## 2013-06-26 LAB — HEPARIN LEVEL (UNFRACTIONATED): HEPARIN UNFRACTIONATED: 0.28 [IU]/mL — AB (ref 0.30–0.70)

## 2013-06-26 LAB — CBC
HCT: 42 % (ref 39.0–52.0)
Hemoglobin: 14.5 g/dL (ref 13.0–17.0)
MCH: 31.7 pg (ref 26.0–34.0)
MCHC: 34.5 g/dL (ref 30.0–36.0)
MCV: 91.9 fL (ref 78.0–100.0)
PLATELETS: 226 10*3/uL (ref 150–400)
RBC: 4.57 MIL/uL (ref 4.22–5.81)
RDW: 13.8 % (ref 11.5–15.5)
WBC: 6.2 10*3/uL (ref 4.0–10.5)

## 2013-06-26 LAB — HEMOGLOBIN A1C
Hgb A1c MFr Bld: 6.2 % — ABNORMAL HIGH (ref <5.7)
Mean Plasma Glucose: 131 mg/dL — ABNORMAL HIGH (ref <117)

## 2013-06-26 MED ORDER — FUROSEMIDE 10 MG/ML IJ SOLN
40.0000 mg | Freq: Two times a day (BID) | INTRAMUSCULAR | Status: DC
  Administered 2013-06-26 – 2013-06-27 (×2): 40 mg via INTRAVENOUS
  Filled 2013-06-26 (×4): qty 4

## 2013-06-26 MED ORDER — AMIODARONE HCL IN DEXTROSE 360-4.14 MG/200ML-% IV SOLN
30.0000 mg/h | INTRAVENOUS | Status: DC
  Administered 2013-06-27 – 2013-06-30 (×7): 30 mg/h via INTRAVENOUS
  Filled 2013-06-26 (×23): qty 200

## 2013-06-26 MED ORDER — METOPROLOL TARTRATE 25 MG PO TABS
25.0000 mg | ORAL_TABLET | Freq: Two times a day (BID) | ORAL | Status: DC
  Administered 2013-06-26 – 2013-07-01 (×10): 25 mg via ORAL
  Filled 2013-06-26 (×12): qty 1

## 2013-06-26 MED ORDER — LISINOPRIL 2.5 MG PO TABS
2.5000 mg | ORAL_TABLET | Freq: Every day | ORAL | Status: DC
  Administered 2013-06-26 – 2013-06-28 (×3): 2.5 mg via ORAL
  Filled 2013-06-26 (×4): qty 1

## 2013-06-26 MED ORDER — AMIODARONE LOAD VIA INFUSION
150.0000 mg | Freq: Once | INTRAVENOUS | Status: AC
  Administered 2013-06-26: 150 mg via INTRAVENOUS
  Filled 2013-06-26 (×2): qty 83.34

## 2013-06-26 MED ORDER — TECHNETIUM TC 99M SESTAMIBI GENERIC - CARDIOLITE
10.0000 | Freq: Once | INTRAVENOUS | Status: AC | PRN
  Administered 2013-06-26: 10 via INTRAVENOUS

## 2013-06-26 MED ORDER — AMIODARONE HCL IN DEXTROSE 360-4.14 MG/200ML-% IV SOLN
60.0000 mg/h | INTRAVENOUS | Status: AC
  Administered 2013-06-26: 60 mg/h via INTRAVENOUS
  Filled 2013-06-26: qty 200

## 2013-06-26 MED ORDER — HEPARIN BOLUS VIA INFUSION
3000.0000 [IU] | Freq: Once | INTRAVENOUS | Status: AC
  Administered 2013-06-26: 3000 [IU] via INTRAVENOUS
  Filled 2013-06-26: qty 3000

## 2013-06-26 MED ORDER — TECHNETIUM TC 99M SESTAMIBI GENERIC - CARDIOLITE
30.0000 | Freq: Once | INTRAVENOUS | Status: AC | PRN
  Administered 2013-06-26: 30 via INTRAVENOUS

## 2013-06-26 MED ORDER — REGADENOSON 0.4 MG/5ML IV SOLN
INTRAVENOUS | Status: AC
  Filled 2013-06-26: qty 5

## 2013-06-26 NOTE — Care Management Note (Unsigned)
    Page 1 of 1   06/30/2013     3:36:40 PM CARE MANAGEMENT NOTE 06/30/2013  Patient:  Jose Snyder, Jose Snyder   Account Number:  [id number]  Date Initiated:  06/26/2013  Documentation initiated by:  Aries Kasa  Subjective/Objective Assessment:   Pt adm on 06/25/13 with Afib with RVR.  PTA, pt independent, lives alone.     Action/Plan:   Will follow for dc needs as pt progresses.   Anticipated DC Date:  07/01/2013   Anticipated DC Plan:  [place]  CM consult  Medication Assistance      Choice offered to / List presented to:             Status of service:  In process, will continue to follow Medicare Important Message given?  YES (If response is "NO", the following Medicare IM given date fields will be blank) Date Medicare IM given:  06/26/2013 Date Additional Medicare IM given:    Discharge Disposition:    Per UR Regulation:  Reviewed for med. necessity/level of care/duration of stay  If discussed at [place] of Stay Meetings, dates discussed:    Comments:  06/30/13 Ellan Lambert, RN, BSN [phone] Pt concerned about getting amiodarone and Elquis at dc, as he gets his meds from the [place].  Pt given 30 day free trial card for Eliquis, so will get 1st Rx of Eliquis for free at retail pharmacy. Will need separate Rx for this.  Pt states he will make an appt asap with his [place] PCP when he is discharged, and then she will write RX for all dc medications to be filled at [place].   Would recommend writing Rx for 10-14 day supply of amiodarone to be filled at retail pharmacy before pt sees his [place].  This way he will not have to pay for full 30 day RX.  Pt is agreeable to this plan.

## 2013-06-26 NOTE — Progress Notes (Signed)
ANTICOAGULATION CONSULT NOTE - Follow Up Consult  Pharmacy Consult for heparin Indication: chest pain/ACS and atrial fibrillation  Labs:  Recent Labs  06/25/13 0850 06/25/13 1540 06/25/13 2007 06/25/13 2336  HGB 15.1  --   --   --   HCT 44.0  --   --   --   PLT 263  --   --   --   LABPROT  --  16.1*  --   --   INR  --  1.32  --   --   HEPARINUNFRC  --   --   --  0.10*  CREATININE 1.40*  --   --   --   TROPONINI <0.30 <0.30 <0.30  --     Assessment: 73yo male subtherapeutic on heparin with initial dosing for CP and new Afib.  Goal of Therapy:  Heparin level 0.3-0.7 units/ml   Plan:  Will rebolus with heparin 3000 units and increase gtt by 4 units/kg/hr to 1600 units/hr and check level in 6hr.  Wynona Neat, PharmD, BCPS  06/26/2013,12:29 AM

## 2013-06-26 NOTE — Progress Notes (Addendum)
ANTICOAGULATION CONSULT NOTE - Follow Up Consult  Pharmacy Consult for heparin Indication: chest pain/ACS and atrial fibrillation  Labs:  Recent Labs  06/25/13 0850 06/25/13 1540 06/25/13 2007 06/25/13 2336 06/26/13 0050 06/26/13 0520 06/26/13 1035  HGB 15.1  --   --   --   --  14.5  --   HCT 44.0  --   --   --   --  42.0  --   PLT 263  --   --   --   --  226  --   LABPROT  --  16.1*  --   --   --  14.8  --   INR  --  1.32  --   --   --  1.19  --   HEPARINUNFRC  --   --   --  0.10*  --   --  0.28*  CREATININE 1.40*  --   --   --   --  1.27  --   TROPONINI <0.30 <0.30 <0.30  --  <0.30  --   --     Assessment: 73yom admitted with week Hx DOE, chest tightness.  He was fouond to have Afib RVR rate improved with diltiazem> changing to amiodarone.   ECHO EF 20%, He had stress test today showed inferior defect - plan to cath pt.   Currently on heparin drip 1600 uts/hr HL 0.28, CBC stable.    Goal of Therapy:  Heparin level 0.3-0.7 units/ml   Plan:  Increase heparin drip 1800 uts/hr  Daily HL, CBC  Bonnita Nasuti Pharm.D. CPP, BCPS Clinical Pharmacist [phone] 06/26/2013 2:51 PM

## 2013-06-26 NOTE — Progress Notes (Signed)
A very active 73 year old dominant with a history of obesity, hypertension, but no past history of coronary disease, or significant family history of coronary disease. He mows his lawn routinely. However he noted about week history of worsening exertional dyspnea to the point where he had to stop to catch his breath just walking to the mailbox. He noted some tightness across his chest no radiation. He was found to be nature fibrillation with RVR heart rates in the 150s, he was transferred to the [facility] from [facility].  Subjective:  Some chest tightness last night; says he feels worse since admission.  Objective:  Vital Signs in the last 24 hours: Temp:  [97.7 F (36.5 C)-98 F (36.7 C)] 97.7 F (36.5 C) (04/30 1334) Pulse Rate:  [81-136] 97 (04/30 1334) Resp:  [17-18] 18 (04/30 1334) BP: (116-129)/(76-95) 124/76 mmHg (04/30 1334) SpO2:  [93 %-100 %] 100 % (04/30 1334) Weight:  [248 lb 3.8 oz (112.6 kg)] 248 lb 3.8 oz (112.6 kg) (04/30 0500)  Intake/Output from previous day:  Intake/Output Summary (Last 24 hours) at 06/26/13 1443 Last data filed at 06/26/13 1230  Gross per 24 hour  Intake 437.15 ml  Output      0 ml  Net 437.15 ml    Physical Exam: General appearance: alert, cooperative, no distress and mildly obese Lungs: clear to auscultation bilaterally; normal effort Heart: irregularly irregular rhythm; rapid rate. No M./R./G. heard. Abdomen: Soft, and protuberant but nondistended; NABS Extremities: mild edema, Neuro: Grossly intact    Rate: 90-140  Rhythm: atrial fibrillation  Lab Results:  Recent Labs  06/25/13 0850 06/26/13 0520  WBC 8.2 6.2  HGB 15.1 14.5  PLT 263 226    Recent Labs  06/25/13 0850 06/26/13 0520  NA 136* 135*  K 4.3 4.0  CL 98 100  CO2 24 19  GLUCOSE 211* 134*  BUN 22 23  CREATININE 1.40* 1.27    Recent Labs  06/25/13 2007 06/26/13 0050  TROPONINI <0.30 <0.30    Recent Labs  06/26/13 0520  INR 1.19     Imaging: Imaging results have been reviewed  Cardiac Studies:  1. Myoview: Pending 2. 2-D echocardiogram: 06/25/2012  Normal LV size with severe dysfunction, EF 20-25% diffuse hypokinesis. Restrictive diastolic physiology  Moderate severely dilated left and right atrium with mildly dilated right ventricle.   Mild MR  Assessment/Plan:   Principal Problem:   Chest pain with moderate risk of acute coronary syndrome Active Problems:   Atrial fibrillation with rapid ventricular response   Acute combined systolic and diastolic heart failure, NYHA class 3; complicated by A. fib   Cardiomyopathy - EF 20-25% by echo   HTN (hypertension)   Dyspnea on exertion   History of prostate cancer   PLAN: Myoview today. ? TEE/CV in am.  Kerin Ransom PA-C [place] [phone] ,     I have seen and evaluated the patient this PM along with Kerin Ransom, PA-C. I agree with his findings, examination.  2D Echo read in interim -- very concerning:  EF 20-25% with global hypokinesis; also noted is significant restrictive physiology for diastolic compliance. Bilateral atria moderate-severely dilated. and mildly dilated right ventricle.  Myoview performed, not yet read. There does potentially appear to be some inferior defect. However despite that with significant reduction in EF as well as chest pain, he definitely need right left heart catheterization, would wait until he is either elevate her fibrillation or has better rate control. Probably Monday.  Was  not placed on Lasix. We'll start low-dose IV Lasix for diuresis based on his PND and orthopnea symptoms of weight gain.  Atrial fibrillation rate is borderline adequately controlled. Currently read as 97 but has been in the 120s..  With low EF, we'll convert from IV diltiazem to amiodarone. He is on IV heparin for anticoagulation. We'll continue with IV heparin until cardiac catheterization. If he continues in A. fib over the weekend, would potentially  consider cardioversion depending what the catheterization shows.  With blood pressures in the 120s, we'll initiate low-dose ACE inhibitor for after the reduction. Will also start low-dose beta blocker for rate control and CHF.  Leonie Man, M.D., M.S. Interventional Cardiologist  [place] Pager # [phone] 06/26/2013

## 2013-06-26 NOTE — Progress Notes (Signed)
Utilization review completed. Jamie Hafford, RN, BSN. 

## 2013-06-27 ENCOUNTER — Encounter (HOSPITAL_COMMUNITY)
Admission: EM | Disposition: A | Discharge status: Home / Self Care | Payer: Self-pay | Source: Home / Self Care | Attending: Cardiovascular Disease

## 2013-06-27 DIAGNOSIS — I2510 Atherosclerotic heart disease of native coronary artery without angina pectoris: Secondary | ICD-10-CM

## 2013-06-27 HISTORY — PX: LEFT AND RIGHT HEART CATHETERIZATION WITH CORONARY ANGIOGRAM: SHX5449

## 2013-06-27 HISTORY — PX: CARDIAC CATHETERIZATION: SHX172

## 2013-06-27 LAB — POCT I-STAT 3, VENOUS BLOOD GAS (G3P V)
ACID-BASE DEFICIT: 1 mmol/L (ref 0.0–2.0)
ACID-BASE DEFICIT: 2 mmol/L (ref 0.0–2.0)
Bicarbonate: 21.9 mEq/L (ref 20.0–24.0)
Bicarbonate: 23.5 mEq/L (ref 20.0–24.0)
O2 SAT: 62 %
O2 SAT: 91 %
TCO2: 25 mmol/L (ref 0–100)
TCO2: 23 mmol/L (ref 0–100)
pCO2, Ven: 34.3 mmHg — ABNORMAL LOW (ref 45.0–50.0)
pCO2, Ven: 37.4 mmHg — ABNORMAL LOW (ref 45.0–50.0)
pH, Ven: 7.406 — ABNORMAL HIGH (ref 7.250–7.300)
pH, Ven: 7.412 — ABNORMAL HIGH (ref 7.250–7.300)
pO2, Ven: 32 mmHg (ref 30.0–45.0)
pO2, Ven: 59 mmHg — ABNORMAL HIGH (ref 30.0–45.0)

## 2013-06-27 LAB — BASIC METABOLIC PANEL
BUN: 26 mg/dL — ABNORMAL HIGH (ref 6–23)
CALCIUM: 9.2 mg/dL (ref 8.4–10.5)
CO2: 23 meq/L (ref 19–32)
CREATININE: 1.52 mg/dL — AB (ref 0.50–1.35)
Chloride: 99 mEq/L (ref 96–112)
GFR calc Af Amer: 51 mL/min — ABNORMAL LOW (ref >90)
GFR, EST NON AFRICAN AMERICAN: 44 mL/min — AB (ref >90)
Glucose, Bld: 129 mg/dL — ABNORMAL HIGH (ref 70–99)
Potassium: 4.1 mEq/L (ref 3.7–5.3)
Sodium: 138 mEq/L (ref 137–147)

## 2013-06-27 LAB — HEPARIN LEVEL (UNFRACTIONATED): Heparin Unfractionated: 0.48 IU/mL (ref 0.30–0.70)

## 2013-06-27 LAB — POCT ACTIVATED CLOTTING TIME: ACTIVATED CLOTTING TIME: 171 s

## 2013-06-27 SURGERY — LEFT AND RIGHT HEART CATHETERIZATION WITH CORONARY ANGIOGRAM
Anesthesia: LOCAL

## 2013-06-27 MED ORDER — HEPARIN SODIUM (PORCINE) 1000 UNIT/ML IJ SOLN
INTRAMUSCULAR | Status: AC
  Filled 2013-06-27: qty 1

## 2013-06-27 MED ORDER — SODIUM CHLORIDE 0.9 % IJ SOLN: 3.0000 mL | INTRAMUSCULAR | Status: DC | PRN

## 2013-06-27 MED ORDER — APIXABAN 5 MG PO TABS
5.0000 mg | ORAL_TABLET | Freq: Two times a day (BID) | ORAL | Status: DC
  Administered 2013-06-27 – 2013-07-01 (×8): 5 mg via ORAL
  Filled 2013-06-27 (×9): qty 1

## 2013-06-27 MED ORDER — SODIUM CHLORIDE 0.9 % IV SOLN
INTRAVENOUS | Status: DC
  Administered 2013-06-27: 14:00:00 via INTRAVENOUS

## 2013-06-27 MED ORDER — DIAZEPAM 2 MG PO TABS
2.0000 mg | ORAL_TABLET | ORAL | Status: AC
  Administered 2013-06-27: 2 mg via ORAL
  Filled 2013-06-27: qty 1

## 2013-06-27 MED ORDER — SODIUM CHLORIDE 0.9 % IJ SOLN: 3.0000 mL | Freq: Two times a day (BID) | INTRAMUSCULAR | Status: DC

## 2013-06-27 MED ORDER — FUROSEMIDE 10 MG/ML IJ SOLN
40.0000 mg | Freq: Once | INTRAMUSCULAR | Status: AC
  Administered 2013-06-27: 40 mg via INTRAVENOUS
  Filled 2013-06-27: qty 4

## 2013-06-27 MED ORDER — FUROSEMIDE 10 MG/ML IJ SOLN
80.0000 mg | Freq: Two times a day (BID) | INTRAMUSCULAR | Status: DC
  Administered 2013-06-27 – 2013-06-29 (×4): 80 mg via INTRAVENOUS
  Filled 2013-06-27 (×5): qty 8

## 2013-06-27 MED ORDER — SODIUM CHLORIDE 0.9 % IV SOLN: 250.0000 mL | INTRAVENOUS | Status: DC | PRN

## 2013-06-27 MED ORDER — ALPRAZOLAM 0.5 MG PO TABS
0.5000 mg | ORAL_TABLET | Freq: Three times a day (TID) | ORAL | Status: DC | PRN
  Administered 2013-06-27 – 2013-06-30 (×5): 0.5 mg via ORAL
  Filled 2013-06-27 (×5): qty 1

## 2013-06-27 MED ORDER — HEPARIN (PORCINE) IN NACL 2-0.9 UNIT/ML-% IJ SOLN
INTRAMUSCULAR | Status: AC
  Filled 2013-06-27: qty 1500

## 2013-06-27 MED ORDER — NITROGLYCERIN 0.2 MG/ML ON CALL CATH LAB
INTRAVENOUS | Status: AC
  Filled 2013-06-27: qty 1

## 2013-06-27 MED ORDER — MIDAZOLAM HCL 2 MG/2ML IJ SOLN
INTRAMUSCULAR | Status: AC
  Filled 2013-06-27: qty 2

## 2013-06-27 MED ORDER — DIGOXIN 0.25 MG/ML IJ SOLN
0.5000 mg | Freq: Once | INTRAMUSCULAR | Status: AC
  Administered 2013-06-27: 0.5 mg via INTRAVENOUS
  Filled 2013-06-27: qty 2

## 2013-06-27 MED ORDER — LIDOCAINE HCL (PF) 1 % IJ SOLN
INTRAMUSCULAR | Status: AC
  Filled 2013-06-27: qty 30

## 2013-06-27 MED ORDER — ASPIRIN 81 MG PO CHEW
81.0000 mg | CHEWABLE_TABLET | ORAL | Status: AC
  Administered 2013-06-27: 81 mg via ORAL
  Filled 2013-06-27: qty 1

## 2013-06-27 MED ORDER — FENTANYL CITRATE 0.05 MG/ML IJ SOLN
INTRAMUSCULAR | Status: AC
  Filled 2013-06-27: qty 2

## 2013-06-27 MED ORDER — SODIUM CHLORIDE 0.9 % IV SOLN
INTRAVENOUS | Status: DC
  Administered 2013-06-27 – 2013-06-30 (×2): via INTRAVENOUS

## 2013-06-27 MED ORDER — VERAPAMIL HCL 2.5 MG/ML IV SOLN
INTRAVENOUS | Status: AC
  Filled 2013-06-27: qty 2

## 2013-06-27 MED ORDER — HEPARIN (PORCINE) IN NACL 100-0.45 UNIT/ML-% IJ SOLN
1800.0000 [IU]/h | INTRAMUSCULAR | Status: DC
  Filled 2013-06-27: qty 250

## 2013-06-27 NOTE — Progress Notes (Signed)
Patient ID: Jose Snyder, male   DOB: August 28, 1940, 73 y.o.   MRN: [id number]  A very active 73 year old dominant with a history of obesity, hypertension, but no past history of coronary disease, or significant family history of coronary disease. He mows his lawn routinely. However he noted about week history of worsening exertional dyspnea to the point where he had to stop to catch his breath just walking to the mailbox. He noted some tightness across his chest no radiation. He was found to be in atrial fibrillation with RVR- 150s, he was transferred to the [facility] from [facility]  Subjective:  Some chest tightness last night; says he feels worse since admission. Very difficult night, and sweats did not sleep well; anxious  Objective:  Vital Signs in the last 24 hours: Temp:  [97.5 F (36.4 C)-97.8 F (36.6 C)] 97.8 F (36.6 C) (05/01 0412) Pulse Rate:  [72-136] 93 (05/01 0412) Resp:  [18] 18 (05/01 0412) BP: (93-128)/(60-94) 93/67 mmHg (05/01 0412) SpO2:  [94 %-100 %] 94 % (05/01 0412) Weight:  [248 lb 10.9 oz (112.8 kg)] 248 lb 10.9 oz (112.8 kg) (05/01 0412)  Intake/Output from previous day:  Intake/Output Summary (Last 24 hours) at 06/27/13 0853 Last data filed at 06/27/13 0413  Gross per 24 hour  Intake    360 ml  Output    400 ml  Net    -40 ml    Physical Exam: General appearance: alert, cooperative, no distress and mildly obese Lungs: clear to auscultation bilaterally; normal effort Heart: irregularly irregular rhythm; rapid rate. No M./R./G. heard. Abdomen: Soft, and protuberant but nondistended; NABS Extremities: mild edema, Neuro: Grossly intact    Rate: 80-110  Rhythm: atrial fibrillation  Lab Results:  Recent Labs  06/25/13 0850 06/26/13 0520  WBC 8.2 6.2  HGB 15.1 14.5  PLT 263 226    Recent Labs  06/25/13 0850 06/26/13 0520  NA 136* 135*  K 4.3 4.0  CL 98 100  CO2 24 19  GLUCOSE 211* 134*  BUN 22 23  CREATININE 1.40* 1.27     Recent Labs  06/25/13 2007 06/26/13 0050  TROPONINI <0.30 <0.30    Recent Labs  06/26/13 0520  INR 1.19    Imaging: Imaging results have been reviewed  Cardiac Studies:  1. Myoview: Abnormal: Small apical perfusion defect consistent with scar. No reversible ischemia noted but EF was 31% with apical hypokinesis. 2. 2-D echocardiogram: 06/25/2012  Normal LV size with severe dysfunction, EF 20-25% diffuse hypokinesis. Restrictive diastolic physiology  Moderate severely dilated left and right atrium with mildly dilated right ventricle.   Mild MR  Assessment/Plan:   Principal Problem:   Chest pain with moderate risk of acute coronary syndrome Active Problems:   Atrial fibrillation with rapid ventricular response   Acute combined systolic and diastolic heart failure, NYHA class 3; complicated by A. fib   Cardiomyopathy - EF 20-25% by echo   HTN (hypertension)   Dyspnea on exertion   History of prostate cancer  With new onset cardiomyopathy, EF in the 20s to 30s depending on the study, with chest discomfort he would warrant cardiac catheterization.  Was not placed on Lasix. We'll start low-dose IV Lasix for diuresis based on his PND and orthopnea symptoms of weight gain.  Will increase his diuretic dosing to 80 twice a day and give additional dose now,   Atrial fibrillation rate is better controlled on amiodarone, however he still remains in A. Fib; will convert  to by mouth amiodarone tonight after 24-hour load.  Pending results of cardiac catheterization, would consider TEE guided cardioversion if he remains in A. fib over the weekend.  We'll try a loading dose of digoxin for additional rate control; he with his low EF he would potentially benefit from being on digoxin  Low-dose ACE inhibitor added last night. No room for additional titration based on blood pressures in the 90s.  As pressures tolerate would consider adding spironolactone for additional diuretic effect  or  Antianxiety. I will add when necessary Xanax. Premedicate for cath with Valium  Leonie Man, M.D., M.S. Interventional Cardiologist  [place] Pager # [phone] 06/27/2013

## 2013-06-27 NOTE — Interval H&P Note (Signed)
Cath Lab Visit (complete for each Cath Lab visit)  Clinical Evaluation Leading to the Procedure:   ACS: yes  Non-ACS:    Anginal Classification: CCS IV  Anti-ischemic medical therapy: Minimal Therapy (1 class of medications)  Non-Invasive Test Results: High risk findings due to low EF.  Prior CABG: No previous CABG      History and Physical Interval Note:  06/27/2013 2:57 PM  Jose Snyder  has presented today for surgery, with the diagnosis of cp  The various methods of treatment have been discussed with the patient and family. After consideration of risks, benefits and other options for treatment, the patient has consented to  Procedure(s): LEFT AND RIGHT HEART CATHETERIZATION WITH CORONARY ANGIOGRAM (N/A) as a surgical intervention .  The patient's history has been reviewed, patient examined, no change in status, stable for surgery.  I have reviewed the patient's chart and labs.  Questions were answered to the patient's satisfaction.     Jose Snyder

## 2013-06-27 NOTE — Progress Notes (Signed)
ANTICOAGULATION CONSULT NOTE - Initial Consult  Pharmacy Consult for apixaban Indication: atrial fibrillation  No Known Allergies  Patient Measurements: Height: 6\' 2"  (188 cm) Weight: 248 lb 10.9 oz (112.8 kg) IBW/kg (Calculated) : 82.2 Heparin Dosing Weight:   Vital Signs: Temp: 98 F (36.7 C) (05/01 1408) Temp src: Oral (05/01 1408) BP: 95/67 mmHg (05/01 1408) Pulse Rate: 85 (05/01 1408)  Labs:  Recent Labs  06/25/13 0850 06/25/13 1540 06/25/13 2007 06/25/13 2336 06/26/13 0050 06/26/13 0520 06/26/13 1035 06/27/13 0420 06/27/13 0930  HGB 15.1  --   --   --   --  14.5  --   --   --   HCT 44.0  --   --   --   --  42.0  --   --   --   PLT 263  --   --   --   --  226  --   --   --   LABPROT  --  16.1*  --   --   --  14.8  --   --   --   INR  --  1.32  --   --   --  1.19  --   --   --   HEPARINUNFRC  --   --   --  0.10*  --   --  0.28* 0.48  --   CREATININE 1.40*  --   --   --   --  1.27  --   --  1.52*  TROPONINI <0.30 <0.30 <0.30  --  <0.30  --   --   --   --     Estimated Creatinine Clearance: 58.7 ml/min (by C-G formula based on Cr of 1.52).   Medical History: Past Medical History  Diagnosis Date  . Hypertension   . Obesity   . Prostate cancer   . GERD (gastroesophageal reflux disease)     Medications:  Scheduled:  . docusate sodium  100 mg Oral BID  . fluticasone  2 spray Each Nare Daily  . furosemide  80 mg Intravenous BID  . lisinopril  2.5 mg Oral Daily  . metoprolol tartrate  25 mg Oral BID  . pantoprazole  40 mg Oral Daily  . sodium chloride  3 mL Intravenous Q12H   Infusions:  . [START ON 06/28/2013] sodium chloride 75 mL/hr at 06/27/13 1334  . sodium chloride    . amiodarone 30 mg/hr (06/27/13 0856)    Assessment: 73 yo male s/p cath will be started on apixaban.  Heparin is discontinued.  Wt 112.8 kg; SCr 1.52 and 73 yo. Goal of Therapy:   Monitor platelets by anticoagulation protocol: Yes   Plan:  1) Start apixaban 5mg  po bid at  2000 tonight 2) monitor CBC  Tsz-Yin Warren Lindahl 06/27/2013,4:05 PM

## 2013-06-27 NOTE — CV Procedure (Signed)
       PROCEDURE:  Left heart catheterization with selective coronary angiography, right heart catheterization.  INDICATIONS:  CHF  The risks, benefits, and details of the procedure were explained to the patient.  The patient verbalized understanding and wanted to proceed.  Informed written consent was obtained.  PROCEDURE TECHNIQUE:  After Xylocaine anesthesia a 53F slender sheath was placed in the right radial artery with a single anterior needle wall stick.   Right coronary angiography was done using a Judkins R4 guide catheter.  Left coronary angiography was done using a Judkins L3.5 guide catheter.  Left ventriculography was done using a pigtail catheter.  A TR band was used for hemostasis.   CONTRAST:  Total of 50 cc.  COMPLICATIONS:  None.    HEMODYNAMICS:  Aortic pressure was 108/73; LV pressure was 107/10; LVEDP 24.  There was no gradient between the left ventricle and aorta.  Right atrial pressure 16/17, mean gradient or pressure 15 mmHg. RV pressure 35/12, RVEDP 15 mmHg. PA pressure 39/24, mean PA pressure 29 mmHg. Pulmonic capillary wedge pressure 25/26, mean pulmonary capillary wedge pressure 24 mmHg. Pulmonary artery saturation, 62%. Aortic saturation 91%. Cardiac output 5.5 L per minute. Cardiac index 2.3.  ANGIOGRAPHIC DATA:   The left main coronary artery is widely patent.  The left anterior descending artery is a medium size vessel which does reach the apex. There is mild to moderate diffuse disease throughout the vessel. In the mid vessel, there is a focal 40% stenosis.  There is a large first diagonal. There is a focal 75% stenosis in the midportion of this diagonal.  The left circumflex artery is a large dominant vessel. In the mid vessel, there is a 50-60% stenosis. The first obtuse marginal is small. Is patent. Second obtuse marginal is medium size and appears widely patent. The third obtuse marginal is patent as well.  The right coronary artery is medium sized  proximally. It is a  nondominant vessel . There is a 50% lesion in the mid vessel.  The distal vessel is very small.  LEFT VENTRICULOGRAM:  Left ventricular angiogram was  not done due to renal insufficiency. By echocardiogram,  ejection fraction of 20%.  LVEDP was 24 mmHg.  IMPRESSIONS:  1. Normal left main coronary artery. 2. Mild to moderate disease in the  left anterior descending artery and its branches.  There is a 75% first diagonal lesion.  3. Mild to moderate disease in the  left circumflex artery and its branches. 4. Mild disease in the nondominant  right coronary artery. 5. LVEDP 24  mmHg.  Ejection fraction 20% by echocardiogram. 6.  Mean pulmonary capillary wedge pressure 24 mmHg. Pulmonary artery saturation, 62%. Aortic saturation 91%. Cardiac output 5.5 L per minute. Cardiac index 2.3.  RECOMMENDATION:  Cardiomyopathy is well out of proportion to his degree of coronary artery disease. No revascularization is indicated at this time.  Would reassess him clinically for ischemia after his LV function improves with medical therapy. Continue medical therapy for her LV systolic dysfunction. Will start anticoagulation at about 8:00 tonight for his atrial fibrillation. Per Dr. Ellyn Hack, plan will be for cardioversion on Monday.  Long-term followup with Dr. Angelena Form.

## 2013-06-27 NOTE — Progress Notes (Signed)
Pharmacy Note-Anticoagulation  Pharmacy Consult :  73 y.o. male is currently on Heparin for atrial fibrillation w/RVR .   Latest Labs : Hematology :  Recent Labs  06/25/13 0850 06/25/13 1540 06/25/13 2336 06/26/13 0520 06/26/13 1035 06/27/13 0420  HGB 15.1  --   --  14.5  --   --   HCT 44.0  --   --  42.0  --   --   PLT 263  --   --  226  --   --   LABPROT  --  16.1*  --  14.8  --   --   INR  --  1.32  --  1.19  --   --   HEPARINUNFRC  --   --  0.10*  --  0.28* 0.48  CREATININE 1.40*  --   --  1.27  --   --    Current Medication[s] Include: Infusion[s]: Infusions:  . amiodarone 30 mg/hr (06/27/13 0856)  . heparin 1,800 Units/hr (06/26/13 1511)    Assessment :    Heparin level 0.48 units/ml on 1800 units/hr.  No evidence of bleeding complications observed.  Goal :  Heparin level 0.3-0.7 units/ml.  Plan : 1. Heparin will be continued at the same rate.   The next Heparin Level due with AM labs. 2. Daily Heparin level, INR, CBC, and Monitor for bleeding complications.  Follow [place] counts.  Estelle June, Pharm.D. 06/27/2013  10:17 AM

## 2013-06-27 NOTE — Progress Notes (Signed)
TR band removed following protocol. No complications. No bleeding noted. Level 0. Dressing applied. VS stable. Roxan Hockey

## 2013-06-27 NOTE — H&P (View-Only) (Signed)
Patient ID: Jose Snyder, male   DOB: August 28, 1940, 73 y.o.   MRN: [id number]  A very active 73 year old dominant with a history of obesity, hypertension, but no past history of coronary disease, or significant family history of coronary disease. He mows his lawn routinely. However he noted about week history of worsening exertional dyspnea to the point where he had to stop to catch his breath just walking to the mailbox. He noted some tightness across his chest no radiation. He was found to be in atrial fibrillation with RVR- 150s, he was transferred to the [facility] from [facility]  Subjective:  Some chest tightness last night; says he feels worse since admission. Very difficult night, and sweats did not sleep well; anxious  Objective:  Vital Signs in the last 24 hours: Temp:  [97.5 F (36.4 C)-97.8 F (36.6 C)] 97.8 F (36.6 C) (05/01 0412) Pulse Rate:  [72-136] 93 (05/01 0412) Resp:  [18] 18 (05/01 0412) BP: (93-128)/(60-94) 93/67 mmHg (05/01 0412) SpO2:  [94 %-100 %] 94 % (05/01 0412) Weight:  [248 lb 10.9 oz (112.8 kg)] 248 lb 10.9 oz (112.8 kg) (05/01 0412)  Intake/Output from previous day:  Intake/Output Summary (Last 24 hours) at 06/27/13 0853 Last data filed at 06/27/13 0413  Gross per 24 hour  Intake    360 ml  Output    400 ml  Net    -40 ml    Physical Exam: General appearance: alert, cooperative, no distress and mildly obese Lungs: clear to auscultation bilaterally; normal effort Heart: irregularly irregular rhythm; rapid rate. No M./R./G. heard. Abdomen: Soft, and protuberant but nondistended; NABS Extremities: mild edema, Neuro: Grossly intact    Rate: 80-110  Rhythm: atrial fibrillation  Lab Results:  Recent Labs  06/25/13 0850 06/26/13 0520  WBC 8.2 6.2  HGB 15.1 14.5  PLT 263 226    Recent Labs  06/25/13 0850 06/26/13 0520  NA 136* 135*  K 4.3 4.0  CL 98 100  CO2 24 19  GLUCOSE 211* 134*  BUN 22 23  CREATININE 1.40* 1.27     Recent Labs  06/25/13 2007 06/26/13 0050  TROPONINI <0.30 <0.30    Recent Labs  06/26/13 0520  INR 1.19    Imaging: Imaging results have been reviewed  Cardiac Studies:  1. Myoview: Abnormal: Small apical perfusion defect consistent with scar. No reversible ischemia noted but EF was 31% with apical hypokinesis. 2. 2-D echocardiogram: 06/25/2012  Normal LV size with severe dysfunction, EF 20-25% diffuse hypokinesis. Restrictive diastolic physiology  Moderate severely dilated left and right atrium with mildly dilated right ventricle.   Mild MR  Assessment/Plan:   Principal Problem:   Chest pain with moderate risk of acute coronary syndrome Active Problems:   Atrial fibrillation with rapid ventricular response   Acute combined systolic and diastolic heart failure, NYHA class 3; complicated by A. fib   Cardiomyopathy - EF 20-25% by echo   HTN (hypertension)   Dyspnea on exertion   History of prostate cancer  With new onset cardiomyopathy, EF in the 20s to 30s depending on the study, with chest discomfort he would warrant cardiac catheterization.  Was not placed on Lasix. We'll start low-dose IV Lasix for diuresis based on his PND and orthopnea symptoms of weight gain.  Will increase his diuretic dosing to 80 twice a day and give additional dose now,   Atrial fibrillation rate is better controlled on amiodarone, however he still remains in A. Fib; will convert  to by mouth amiodarone tonight after 24-hour load.  Pending results of cardiac catheterization, would consider TEE guided cardioversion if he remains in A. fib over the weekend.  We'll try a loading dose of digoxin for additional rate control; he with his low EF he would potentially benefit from being on digoxin  Low-dose ACE inhibitor added last night. No room for additional titration based on blood pressures in the 90s.  As pressures tolerate would consider adding spironolactone for additional diuretic effect  or  Antianxiety. I will add when necessary Xanax. Premedicate for cath with Valium  Leonie Man, M.D., M.S. Interventional Cardiologist  [place] Pager # [phone] 06/27/2013

## 2013-06-28 DIAGNOSIS — I2510 Atherosclerotic heart disease of native coronary artery without angina pectoris: Secondary | ICD-10-CM

## 2013-06-28 HISTORY — DX: Atherosclerotic heart disease of native coronary artery without angina pectoris: I25.10

## 2013-06-28 LAB — BASIC METABOLIC PANEL
BUN: 27 mg/dL — AB (ref 6–23)
CALCIUM: 8.9 mg/dL (ref 8.4–10.5)
CO2: 21 meq/L (ref 19–32)
CREATININE: 1.57 mg/dL — AB (ref 0.50–1.35)
Chloride: 102 mEq/L (ref 96–112)
GFR calc Af Amer: 49 mL/min — ABNORMAL LOW (ref >90)
GFR, EST NON AFRICAN AMERICAN: 42 mL/min — AB (ref >90)
GLUCOSE: 109 mg/dL — AB (ref 70–99)
Potassium: 4.2 mEq/L (ref 3.7–5.3)
Sodium: 138 mEq/L (ref 137–147)

## 2013-06-28 MED ORDER — DIGOXIN 0.25 MG/ML IJ SOLN
0.1250 mg | Freq: Every day | INTRAMUSCULAR | Status: DC
  Administered 2013-06-28: 0.125 mg via INTRAVENOUS
  Filled 2013-06-28 (×2): qty 0.5

## 2013-06-28 NOTE — Progress Notes (Addendum)
SUBJECTIVE:  Feels SOB this am  OBJECTIVE:   Vitals:   Filed Vitals:   06/27/13 1730 06/27/13 1900 06/27/13 2132 06/28/13 0525  BP: 144/92 115/54 100/65 132/73  Pulse: 74 75 81 102  Temp:   97.2 F (36.2 C) 97.8 F (36.6 C)  TempSrc:   Oral Oral  Resp: 18  18 18   Height:      Weight:    242 lb 1 oz (109.8 kg)  SpO2:   97% 97%   I&O's:   Intake/Output Summary (Last 24 hours) at 06/28/13 0657 Last data filed at 06/28/13 3329  Gross per 24 hour  Intake  800.4 ml  Output   2000 ml  Net -1199.6 ml   TELEMETRY: Reviewed telemetry pt in atrial fibrillation with RVR:     PHYSICAL EXAM General: Well developed, well nourished, in no acute distress Head: Eyes PERRLA, No xanthomas.   Normal cephalic and atramatic  Lungs:   Crackles at bases bilaterally Heart:   Irregularly irregular tachyS1 S2 Pulses are 2+ & equal. Abdomen: Bowel sounds are positive, abdomen soft and non-tender without masses  Extremities:   No clubbing, cyanosis or edema.  DP +1 Neuro: Alert and oriented X 3. Psych:  Good affect, responds appropriately   LABS: Basic Metabolic Panel:  Recent Labs  06/25/13 0850 06/25/13 1540  06/27/13 0930 06/28/13 0412  NA 136*  --   < > 138 138  K 4.3  --   < > 4.1 4.2  CL 98  --   < > 99 102  CO2 24  --   < > 23 21  GLUCOSE 211*  --   < > 129* 109*  BUN 22  --   < > 26* 27*  CREATININE 1.40*  --   < > 1.52* 1.57*  CALCIUM 9.5  --   < > 9.2 8.9  MG  --  1.7  --   --   --   < > = values in this interval not displayed. Liver Function Tests: No results found for this basename: AST, ALT, ALKPHOS, BILITOT, PROT, ALBUMIN,  in the last 72 hours No results found for this basename: LIPASE, AMYLASE,  in the last 72 hours CBC:  Recent Labs  06/25/13 0850 06/26/13 0520  WBC 8.2 6.2  NEUTROABS 7.0  --   HGB 15.1 14.5  HCT 44.0 42.0  MCV 93.4 91.9  PLT 263 226   Cardiac Enzymes:  Recent Labs  06/25/13 1540 06/25/13 2007 06/26/13 0050  TROPONINI <0.30  <0.30 <0.30   BNP: No components found with this basename: POCBNP,  D-Dimer: No results found for this basename: DDIMER,  in the last 72 hours Hemoglobin A1C:  Recent Labs  06/25/13 1540  HGBA1C 6.2*   Fasting Lipid Panel:  Recent Labs  06/26/13 0520  CHOL 142  HDL 33*  LDLCALC 81  TRIG 139  CHOLHDL 4.3   Thyroid Function Tests:  Recent Labs  06/25/13 1540  TSH 1.590   Anemia Panel: No results found for this basename: VITAMINB12, FOLATE, FERRITIN, TIBC, IRON, RETICCTPCT,  in the last 72 hours Coag Panel:   Lab Results  Component Value Date   INR 1.19 06/26/2013   INR 1.32 06/25/2013    RADIOLOGY: Nm Myocar Single W/spect W/wall Motion And Ef  06/26/2013   EXAM: MYOCARDIAL IMAGING WITH SPECT (REST AND PHARMACOLOGIC-STRESS)  GATED LEFT VENTRICULAR WALL MOTION STUDY  LEFT VENTRICULAR EJECTION FRACTION  TECHNIQUE: Standard myocardial SPECT imaging was performed after  resting intravenous injection of 10 mCi Tc-98m sestamibi. Subsequently, intravenous infusion of Lexiscan was performed under the supervision of the Cardiology staff. At peak effect of the drug, 30 mCi Tc-88m sestamibi was injected intravenously and standard myocardial SPECT imaging was performed. Quantitative gated imaging was also performed to evaluate left ventricular wall motion, and estimate left ventricular ejection fraction.  COMPARISON:  None.  FINDINGS: The patient underwent Lexiscan Myoview stress test under the supervision of the [facility] heart care staff. The patient tolerated the procedure well. EKG at rest shows atrial fibrillation with controlled ventricular response. With stress his heart rate went to 140. The EKG at rest shows poor R-wave progression V1 through V3. With stress there are no new ischemic changes.  The quality of the images is satisfactory. Perfusion imaging shows a small fixed apical defect consistent with old scar. No reversible ischemia is seen. Left ventricular end diastolic volume is 263  ml. The end systolic volume is 98 mL. The left ventricular ejection fraction is 31%. Wall motion analysis shows apical hypokinesis  IMPRESSION: Abnormal Lexiscan Myoview stress test. Small apical perfusion defect on rest and stress images consistent with old scar. No reversible ischemia is seen. There is left ventricular dysfunction with ejection fraction 31% and apical hypokinesis.   Electronically Signed   By: Darlin Coco M.D.   On: 06/26/2013 14:33   Dg Chest Portable 1 View  06/25/2013   CLINICAL DATA:  Shortness of breath, fatigue.  EXAM: PORTABLE CHEST - 1 VIEW  COMPARISON:  02/22/2013  FINDINGS: Heart is upper limits normal in size. No confluent airspace opacities or effusions. No overt edema. No acute bony abnormality.  IMPRESSION: Borderline heart size.  No acute findings.   Electronically Signed   By: Rolm Baptise M.D.   On: 06/25/2013 09:16   Assessment/Plan:  Principal Problem:  Chest pain with moderate risk of acute coronary syndrome  Active Problems:  Atrial fibrillation with rapid ventricular response  Acute combined systolic and diastolic heart failure, NYHA class 3; complicated by A. fib - he is -1.1L out Cardiomyopathy - EF 20-25% by echo -EF 20% by cath - cath showed 75% first diagonal, mild to moderate disease of the left circ and LAD and nondominant RCA.  HTN (hypertension)  Dyspnea on exertion  History of prostate cancer   - Continue his diuretic dosing at 80 twice a day IV and follow renal function closely.  His creatinine is slowly trending up - Atrial fibrillation rate is better controlled on amiodarone, however he still remains in A. Fib with elevated HR. Continue Epixaban/Metoprolo and IV Amiol. TEE guided cardioversion Monday if he remains in A. fib over the weekend.  - As pressures tolerate would consider adding spironolactone for additional diuretic effect  - continue low dose ACEI and metoprolol for DCM as BP tolerates - add daily digoxin for better rate  control  [name], MD  06/28/2013  6:57 AM

## 2013-06-29 LAB — BASIC METABOLIC PANEL
BUN: 33 mg/dL — ABNORMAL HIGH (ref 6–23)
CHLORIDE: 97 meq/L (ref 96–112)
CO2: 28 mEq/L (ref 19–32)
Calcium: 9 mg/dL (ref 8.4–10.5)
Creatinine, Ser: 1.72 mg/dL — ABNORMAL HIGH (ref 0.50–1.35)
GFR calc non Af Amer: 38 mL/min — ABNORMAL LOW (ref >90)
GFR, EST AFRICAN AMERICAN: 44 mL/min — AB (ref >90)
Glucose, Bld: 118 mg/dL — ABNORMAL HIGH (ref 70–99)
POTASSIUM: 3.6 meq/L — AB (ref 3.7–5.3)
SODIUM: 138 meq/L (ref 137–147)

## 2013-06-29 MED ORDER — DIGOXIN 0.25 MG/ML IJ SOLN
0.1250 mg | Freq: Every day | INTRAMUSCULAR | Status: DC
  Filled 2013-06-29: qty 0.5

## 2013-06-29 MED ORDER — POTASSIUM CHLORIDE CRYS ER 20 MEQ PO TBCR
20.0000 meq | EXTENDED_RELEASE_TABLET | Freq: Every day | ORAL | Status: DC
  Administered 2013-06-29 – 2013-07-01 (×3): 20 meq via ORAL
  Filled 2013-06-29 (×3): qty 1

## 2013-06-29 MED ORDER — FUROSEMIDE 10 MG/ML IJ SOLN
40.0000 mg | Freq: Every day | INTRAMUSCULAR | Status: DC
Start: 2013-06-30 — End: 2013-07-01
  Administered 2013-06-30: 40 mg via INTRAVENOUS
  Filled 2013-06-29 (×2): qty 4

## 2013-06-29 MED ORDER — DIGOXIN 0.25 MG/ML IJ SOLN
0.1250 mg | Freq: Every day | INTRAMUSCULAR | Status: AC
  Administered 2013-06-29: 0.125 mg via INTRAVENOUS

## 2013-06-29 NOTE — Progress Notes (Signed)
SUBJECTIVE:  No complaints  OBJECTIVE:   Vitals:   Filed Vitals:   06/28/13 1100 06/28/13 1348 06/28/13 2026 06/29/13 0559  BP:  93/53 95/58 100/71  Pulse: 103 85 85 92  Temp:  98.3 F (36.8 C) 97.5 F (36.4 C) 97.6 F (36.4 C)  TempSrc:  Oral Oral Oral  Resp:  18 17   Height:      Weight:    240 lb 4.8 oz (109 kg)  SpO2:  99% 97% 99%   I&O's:   Intake/Output Summary (Last 24 hours) at 06/29/13 0738 Last data filed at 06/29/13 0600  Gross per 24 hour  Intake    960 ml  Output   3375 ml  Net  -2415 ml   TELEMETRY: Reviewed telemetry pt in atrial fibrillation with RVR in 110's:     PHYSICAL EXAM General: Well developed, well nourished, in no acute distress Head: Eyes PERRLA, No xanthomas.   Normal cephalic and atramatic  Lungs:   Clear bilaterally to auscultation and percussion. Heart:   Irregularly irregular and tachy  S1 S2 Pulses are 2+ & equal. Abdomen: Bowel sounds are positive, abdomen soft and non-tender without masse Extremities:   No clubbing, cyanosis or edema.  DP +1 Neuro: Alert and oriented X 3. Psych:  Good affect, responds appropriately   LABS: Basic Metabolic Panel:  Recent Labs  06/28/13 0412 06/29/13 0500  NA 138 138  K 4.2 3.6*  CL 102 97  CO2 21 28  GLUCOSE 109* 118*  BUN 27* 33*  CREATININE 1.57* 1.72*  CALCIUM 8.9 9.0   Liver Function Tests: No results found for this basename: AST, ALT, ALKPHOS, BILITOT, PROT, ALBUMIN,  in the last 72 hours No results found for this basename: LIPASE, AMYLASE,  in the last 72 hours CBC: No results found for this basename: WBC, NEUTROABS, HGB, HCT, MCV, PLT,  in the last 72 hours Cardiac Enzymes: No results found for this basename: CKTOTAL, CKMB, CKMBINDEX, TROPONINI,  in the last 72 hours BNP: No components found with this basename: POCBNP,  D-Dimer: No results found for this basename: DDIMER,  in the last 72 hours Hemoglobin A1C: No results found for this basename: HGBA1C,  in the last 72  hours Fasting Lipid Panel: No results found for this basename: CHOL, HDL, LDLCALC, TRIG, CHOLHDL, LDLDIRECT,  in the last 72 hours Thyroid Function Tests: No results found for this basename: TSH, T4TOTAL, FREET3, T3FREE, THYROIDAB,  in the last 72 hours Anemia Panel: No results found for this basename: VITAMINB12, FOLATE, FERRITIN, TIBC, IRON, RETICCTPCT,  in the last 72 hours Coag Panel:   Lab Results  Component Value Date   INR 1.19 06/26/2013   INR 1.32 06/25/2013    RADIOLOGY: Nm Myocar Single W/spect W/wall Motion And Ef  06/26/2013   EXAM: MYOCARDIAL IMAGING WITH SPECT (REST AND PHARMACOLOGIC-STRESS)  GATED LEFT VENTRICULAR WALL MOTION STUDY  LEFT VENTRICULAR EJECTION FRACTION  TECHNIQUE: Standard myocardial SPECT imaging was performed after resting intravenous injection of 10 mCi Tc-64m sestamibi. Subsequently, intravenous infusion of Lexiscan was performed under the supervision of the Cardiology staff. At peak effect of the drug, 30 mCi Tc-16m sestamibi was injected intravenously and standard myocardial SPECT imaging was performed. Quantitative gated imaging was also performed to evaluate left ventricular wall motion, and estimate left ventricular ejection fraction.  COMPARISON:  None.  FINDINGS: The patient underwent Lexiscan Myoview stress test under the supervision of the [facility] heart care staff. The patient tolerated the procedure well. EKG at  rest shows atrial fibrillation with controlled ventricular response. With stress his heart rate went to 140. The EKG at rest shows poor R-wave progression V1 through V3. With stress there are no new ischemic changes.  The quality of the images is satisfactory. Perfusion imaging shows a small fixed apical defect consistent with old scar. No reversible ischemia is seen. Left ventricular end diastolic volume is 341 ml. The end systolic volume is 98 mL. The left ventricular ejection fraction is 31%. Wall motion analysis shows apical hypokinesis  IMPRESSION:  Abnormal Lexiscan Myoview stress test. Small apical perfusion defect on rest and stress images consistent with old scar. No reversible ischemia is seen. There is left ventricular dysfunction with ejection fraction 31% and apical hypokinesis.   Electronically Signed   By: Darlin Coco M.D.   On: 06/26/2013 14:33   Dg Chest Portable 1 View  06/25/2013   CLINICAL DATA:  Shortness of breath, fatigue.  EXAM: PORTABLE CHEST - 1 VIEW  COMPARISON:  02/22/2013  FINDINGS: Heart is upper limits normal in size. No confluent airspace opacities or effusions. No overt edema. No acute bony abnormality.  IMPRESSION: Borderline heart size.  No acute findings.   Electronically Signed   By: Rolm Baptise M.D.   On: 06/25/2013 09:16   Assessment/Plan:  Principal Problem:  Chest pain with moderate risk of acute coronary syndrome   Active Problems:  Atrial fibrillation with rapid ventricular response. Atrial fibrillation rate is better controlled on amiodarone, however he still remains in A. Fib with elevated HR.  Dig added yesterday for better rate control.   Continue Epixaban/Metoprolol and IV Amil. TEE guided cardioversion Monday     Acute combined systolic and diastolic heart failure, NYHA class 3; complicated by A. fib - he is net neg 3L on I&O's and lungs are clear with no LE edema.  He is down 2 pounds from yesterday.  His creatinine is starting to increase so will decrease lasix to 40mg  IV daily.  Continue low dose metoprolol for DCM and rate control.  Will hold ACEI for now due to borderline low BP  Cardiomyopathy - EF 20-25% by echo -EF 20% by cath - cath showed 75% first diagonal, mild to moderate disease of the left circ and LAD and nondominant RCA.   HTN (hypertension)   Hypokalemia - replete   [name], MD  06/29/2013  7:38 AM

## 2013-06-29 NOTE — Discharge Instructions (Signed)
Information on my medicine - ELIQUIS® (apixaban) ° °This medication education was reviewed with me or my healthcare representative as part of my discharge preparation.  The pharmacist that spoke with me during my hospital stay was: °Why was Eliquis® prescribed for you? °Eliquis® was prescribed for you to reduce the risk of a blood clot forming that can cause a stroke if you have a medical condition called atrial fibrillation (a type of irregular heartbeat). ° °What do You need to know about Eliquis® ? °Take your Eliquis® 5mg TWICE DAILY - one tablet in the morning and one tablet in the evening with or without food. If you have difficulty swallowing the tablet whole please discuss with your pharmacist how to take the medication safely. ° °Take Eliquis® exactly as prescribed by your doctor and DO NOT stop taking Eliquis® without talking to the doctor who prescribed the medication.  Stopping may increase your risk of developing a stroke.  Refill your prescription before you run out. ° °After discharge, you should have regular check-up appointments with your healthcare provider that is prescribing your Eliquis®.  In the future your dose may need to be changed if your kidney function or weight changes by a significant amount or as you get older. ° °What do you do if you miss a dose? °If you miss a dose, take it as soon as you remember on the same day and resume taking twice daily.  Do not take more than one dose of ELIQUIS at the same time to make up a missed dose. ° °Important Safety Information °A possible side effect of Eliquis® is bleeding. You should call your healthcare provider right away if you experience any of the following: °  Bleeding from an injury or your nose that does not stop. °  Unusual colored urine (red or dark brown) or unusual colored stools (red or black). °  Unusual bruising for unknown reasons. °  A serious fall or if you hit your head (even if there is no bleeding). ° °Some medicines may  interact with Eliquis® and might increase your risk of bleeding or clotting while on Eliquis®. To help avoid this, consult your healthcare provider or pharmacist prior to using any new prescription or non-prescription medications, including herbals, vitamins, non-steroidal anti-inflammatory drugs (NSAIDs) and supplements. ° °This website has more information on Eliquis® (apixaban): www.Eliquis.com. ° °

## 2013-06-30 ENCOUNTER — Encounter (HOSPITAL_COMMUNITY): Payer: Self-pay | Admitting: *Deleted

## 2013-06-30 ENCOUNTER — Encounter (HOSPITAL_COMMUNITY): Payer: Medicare Other | Admitting: Anesthesiology

## 2013-06-30 ENCOUNTER — Inpatient Hospital Stay (HOSPITAL_COMMUNITY): Payer: Medicare Other | Admitting: Anesthesiology

## 2013-06-30 ENCOUNTER — Encounter (HOSPITAL_COMMUNITY)
Admission: EM | Disposition: A | Discharge status: Home / Self Care | Payer: Self-pay | Source: Home / Self Care | Attending: Cardiovascular Disease

## 2013-06-30 DIAGNOSIS — I059 Rheumatic mitral valve disease, unspecified: Secondary | ICD-10-CM

## 2013-06-30 DIAGNOSIS — I4891 Unspecified atrial fibrillation: Secondary | ICD-10-CM

## 2013-06-30 HISTORY — PX: TEE WITHOUT CARDIOVERSION: SHX5443

## 2013-06-30 HISTORY — PX: CARDIOVERSION: SHX1299

## 2013-06-30 LAB — BASIC METABOLIC PANEL
BUN: 33 mg/dL — ABNORMAL HIGH (ref 6–23)
CALCIUM: 9.1 mg/dL (ref 8.4–10.5)
CHLORIDE: 99 meq/L (ref 96–112)
CO2: 27 meq/L (ref 19–32)
CREATININE: 1.43 mg/dL — AB (ref 0.50–1.35)
GFR calc non Af Amer: 47 mL/min — ABNORMAL LOW (ref >90)
GFR, EST AFRICAN AMERICAN: 55 mL/min — AB (ref >90)
Glucose, Bld: 124 mg/dL — ABNORMAL HIGH (ref 70–99)
Potassium: 3.8 mEq/L (ref 3.7–5.3)
SODIUM: 139 meq/L (ref 137–147)

## 2013-06-30 SURGERY — ECHOCARDIOGRAM, TRANSESOPHAGEAL
Anesthesia: General

## 2013-06-30 SURGERY — ECHOCARDIOGRAM, TRANSESOPHAGEAL
Anesthesia: Moderate Sedation

## 2013-06-30 MED ORDER — FUROSEMIDE 40 MG PO TABS
40.0000 mg | ORAL_TABLET | Freq: Every day | ORAL | Status: DC
Start: 2013-07-01 — End: 2013-07-01
  Administered 2013-07-01: 40 mg via ORAL
  Filled 2013-06-30: qty 1

## 2013-06-30 MED ORDER — LIDOCAINE HCL (CARDIAC) 20 MG/ML IV SOLN
INTRAVENOUS | Status: DC | PRN
  Administered 2013-06-30: 40 mg via INTRAVENOUS
  Administered 2013-06-30: 80 mg via INTRAVENOUS

## 2013-06-30 MED ORDER — FENTANYL CITRATE 0.05 MG/ML IJ SOLN
INTRAMUSCULAR | Status: AC
  Filled 2013-06-30: qty 2

## 2013-06-30 MED ORDER — LACTATED RINGERS IV SOLN
INTRAVENOUS | Status: DC | PRN
  Administered 2013-06-30: 14:00:00 via INTRAVENOUS

## 2013-06-30 MED ORDER — PROPOFOL 10 MG/ML IV BOLUS
INTRAVENOUS | Status: DC | PRN
  Administered 2013-06-30 (×3): 20 mg via INTRAVENOUS
  Administered 2013-06-30: 40 mg via INTRAVENOUS
  Administered 2013-06-30: 20 mg via INTRAVENOUS

## 2013-06-30 MED ORDER — LIDOCAINE VISCOUS 2 % MT SOLN
OROMUCOSAL | Status: AC
  Filled 2013-06-30: qty 15

## 2013-06-30 MED ORDER — MIDAZOLAM HCL 5 MG/ML IJ SOLN
INTRAMUSCULAR | Status: AC
  Filled 2013-06-30: qty 2

## 2013-06-30 MED ORDER — SODIUM CHLORIDE 0.9 % IV SOLN: INTRAVENOUS | Status: DC

## 2013-06-30 NOTE — CV Procedure (Signed)
       PROCEDURE NOTE  Procedure:  Transesophageal echocardiogram/DCCV Operator:  Fransico Him, MD Indications:atrial fibrillation IV Meds: Propofol 140mg  IV, Lidocaine 80mg  IV Complications:patient has some problems with cough and increased respiratory secretions on initial intubation and probe removed and patient placed on his left side and reintubated with TEE probe without difficulty.    Results:   Severe LV dysfunction EF 25% Mildly dilated RV which is poorly visualized Severely dilated RA with spontaneous echo contrast Severely dilated LA with spontaneous echo contrast Normal small LAA with no evidence of thrombus.  Emptying velocity was reduced. Normal trileaflet AV with trivial AR Normal MV with mild MR Normal PV Normal TV Normal interatrial septum by colorflow doppler Normal thoracic and ascending aorta  After TEE confirmed no presence of Intracardic thrombus that patient was sedated further with propofol and a 150 watt synchronized biphasic shock was delivered which converted the patient to NSR.  With in a minute that patient converted back to atrial fibrillation. A second shock was delivered at 200 watts which again converted the patient to NSR but then reverted back to atrial fibrillation.  Assessment: 1.  Severe LV dysfunction 2.  Severely dilated RA/LA with no evidence of thrombus by TEE 3.  Initial successful DCCV to NSR but reverted back to atrial fibrillation after both shocks at 150watts and 200watts.  Plan: 1.  Discussed with Dr. Tamala Julian and recommend 3 week load on PO amio prior to attempting repeat DCCV.  The patient was transferred back to their room in stable condition.

## 2013-06-30 NOTE — Anesthesia Postprocedure Evaluation (Signed)
  Anesthesia Post-op Note  Patient: Jose Snyder  Procedure(s) Performed: Procedure(s): TRANSESOPHAGEAL ECHOCARDIOGRAM (TEE)  (N/A) CARDIOVERSION (N/A)  Patient Location: Endoscopy Unit  Anesthesia Type:General  Level of Consciousness: awake, alert , oriented and patient cooperative  Airway and Oxygen Therapy: Patient Spontanous Breathing and Patient connected to nasal cannula oxygen  Post-op Pain: none  Post-op Assessment: Post-op Vital signs reviewed, Patient's Cardiovascular Status Stable, Respiratory Function Stable, Patent Airway and No signs of Nausea or vomiting  Post-op Vital Signs: Reviewed and stable  Last Vitals:  Filed Vitals:   06/30/13 1344  BP: 132/87  Pulse: 100  Temp: 36.8 C  Resp: 18    Complications: No apparent anesthesia complications

## 2013-06-30 NOTE — Progress Notes (Signed)
Echocardiogram Echocardiogram Transesophageal has been performed.  Jose Snyder Jose Snyder 06/30/2013, 3:06 PM

## 2013-06-30 NOTE — Transfer of Care (Signed)
Immediate Anesthesia Transfer of Care Note  Patient: Jose Snyder  Procedure(s) Performed: Procedure(s): TRANSESOPHAGEAL ECHOCARDIOGRAM (TEE)  (N/A) CARDIOVERSION (N/A)  Patient Location: Endoscopy Unit  Anesthesia Type:General  Level of Consciousness: awake, alert , oriented and patient cooperative  Airway & Oxygen Therapy: Patient Spontanous Breathing and Patient connected to nasal cannula oxygen  Post-op Assessment: Report given to PACU RN, Post -op Vital signs reviewed and stable and Patient moving all extremities X 4  Post vital signs: Reviewed and stable  Complications: No apparent anesthesia complications

## 2013-06-30 NOTE — Anesthesia Postprocedure Evaluation (Signed)
  Anesthesia Post-op Note  Patient: Jose Snyder  Procedure(s) Performed: Procedure(s): TRANSESOPHAGEAL ECHOCARDIOGRAM (TEE)  (N/A) CARDIOVERSION (N/A)  Patient Location: PACU and Endoscopy Unit  Anesthesia Type:General  Level of Consciousness: awake  Airway and Oxygen Therapy: Patient Spontanous Breathing  Post-op Pain: mild  Post-op Assessment: Post-op Vital signs reviewed, Patient's Cardiovascular Status Stable, Respiratory Function Stable, Patent Airway, No signs of Nausea or vomiting and Pain level controlled  Post-op Vital Signs: Reviewed and stable  Last Vitals:  Filed Vitals:   06/30/13 1505  BP: 106/69  Pulse: 89  Temp:   Resp: 21    Complications: No apparent anesthesia complications

## 2013-06-30 NOTE — Interval H&P Note (Signed)
History and Physical Interval Note:  06/30/2013 2:18 PM  Jose Snyder  has presented today for surgery, with the diagnosis of A FIB   The various methods of treatment have been discussed with the patient and family. After consideration of risks, benefits and other options for treatment, the patient has consented to  Procedure(s): TRANSESOPHAGEAL ECHOCARDIOGRAM (TEE)  (N/A) CARDIOVERSION (N/A) as a surgical intervention .  The patient's history has been reviewed, patient examined, no change in status, stable for surgery.  I have reviewed the patient's chart and labs.  Questions were answered to the patient's satisfaction.     [name]

## 2013-06-30 NOTE — Progress Notes (Addendum)
Awaiting cardioversion. He is much better than on admission. He will be switched to oral diuretics starting in AM. He will be switched to oral amio this PM or in AM and is a potential discharge in next 1-2 days.

## 2013-06-30 NOTE — Anesthesia Preprocedure Evaluation (Addendum)
Anesthesia Evaluation  Patient identified by MRN, date of birth, ID band Patient awake    Reviewed: Allergy & Precautions, H&P , NPO status , Patient's Chart, lab work & pertinent test results  Airway Mallampati: II TM Distance: >3 FB Neck ROM: Full    Dental  (+) Edentulous Upper, Poor Dentition, Dental Advidsory Given   Pulmonary shortness of breath,  + rhonchi         Cardiovascular hypertension, On Medications + CAD + dysrhythmias Atrial Fibrillation Rhythm:Irregular Rate:Normal  EF 20%   Neuro/Psych    GI/Hepatic GERD-  ,  Endo/Other    Renal/GU      Musculoskeletal   Abdominal   Peds  Hematology   Anesthesia Other Findings Prostate ca  Reproductive/Obstetrics                          Anesthesia Physical Anesthesia Plan  ASA: III  Anesthesia Plan: General   Post-op Pain Management:    Induction: Intravenous  Airway Management Planned: Mask  Additional Equipment:   Intra-op Plan:   Post-operative Plan:   Informed Consent: I have reviewed the patients History and Physical, chart, labs and discussed the procedure including the risks, benefits and alternatives for the proposed anesthesia with the patient or authorized representative who has indicated his/her understanding and acceptance.   Dental Advisory Given  Plan Discussed with: CRNA and Surgeon  Anesthesia Plan Comments:        Anesthesia Quick Evaluation

## 2013-06-30 NOTE — Progress Notes (Signed)
Patient Name: Jose Snyder Date of Encounter: 06/30/2013     Principal Problem:   Chest pain with moderate risk of acute coronary syndrome Active Problems:   Atrial fibrillation with rapid ventricular response   HTN (hypertension)   Dyspnea on exertion   History of prostate cancer   Acute combined systolic and diastolic heart failure, NYHA class 3; complicated by A. fib   Cardiomyopathy - EF 20-25% by echo   CAD (coronary artery disease), native coronary artery    SUBJECTIVE  Feeling great. Hungry, awaiting DCCV today.   CURRENT MEDS . apixaban  5 mg Oral BID  . [START ON 07/01/2013] digoxin  0.125 mg Intravenous Daily  . docusate sodium  100 mg Oral BID  . fluticasone  2 spray Each Nare Daily  . furosemide  40 mg Intravenous Daily  . metoprolol tartrate  25 mg Oral BID  . pantoprazole  40 mg Oral Daily  . potassium chloride  20 mEq Oral Daily    OBJECTIVE  Filed Vitals:   06/29/13 1014 06/29/13 1316 06/29/13 2121 06/30/13 0520  BP: 122/70 110/80 111/64 140/84  Pulse:  70 77 77  Temp:  98 F (36.7 C) 97.7 F (36.5 C) 97.7 F (36.5 C)  TempSrc:  Oral Oral Oral  Resp:  18  18  Height:      Weight:    242 lb 8.1 oz (110 kg)  SpO2:  97%  96%    Intake/Output Summary (Last 24 hours) at 06/30/13 0846 Last data filed at 06/30/13 0646  Gross per 24 hour  Intake 1130.86 ml  Output   1550 ml  Net -419.14 ml   Filed Weights   06/28/13 0525 06/29/13 0559 06/30/13 0520  Weight: 242 lb 1 oz (109.8 kg) 240 lb 4.8 oz (109 kg) 242 lb 8.1 oz (110 kg)    PHYSICAL EXAM  General: Well developed, well nourished, in no acute distress  Head: Eyes PERRLA, No xanthomas. Normal cephalic and atramatic  Lungs: Clear bilaterally to auscultation and percussion.  Heart: Irregularly irregular and tachy S1 S2 Pulses are 2+ & equal.  Abdomen: Bowel sounds are positive, abdomen soft and non-tender without masse  Extremities: No clubbing, cyanosis or edema. DP +1  Neuro: Alert and  oriented X 3.  Psych: Good affect, responds appropriately   Accessory Clinical Findings  Basic Metabolic Panel  Recent Labs  06/29/13 0500 06/30/13 0544  NA 138 139  K 3.6* 3.8  CL 97 99  CO2 28 27  GLUCOSE 118* 124*  BUN 33* 33*  CREATININE 1.72* 1.43*  CALCIUM 9.0 9.1    TELE  afib with controlled rate in 80s  Radiology/Studies  Nm Myocar Single W/spect W/wall Motion And Ef  06/26/2013   EXAM: MYOCARDIAL IMAGING WITH SPECT (REST AND PHARMACOLOGIC-STRESS)  GATED LEFT VENTRICULAR WALL MOTION STUDY  LEFT VENTRICULAR EJECTION FRACTION  TECHNIQUE: Standard myocardial SPECT imaging was performed after resting intravenous injection of 10 mCi Tc-21m sestamibi. Subsequently, intravenous infusion of Lexiscan was performed under the supervision of the Cardiology staff. At peak effect of the drug, 30 mCi Tc-63m sestamibi was injected intravenously and standard myocardial SPECT imaging was performed. Quantitative gated imaging was also performed to evaluate left ventricular wall motion, and estimate left ventricular ejection fraction.  COMPARISON:  None.  FINDINGS: The patient underwent Lexiscan Myoview stress test under the supervision of the [facility] heart care staff. The patient tolerated the procedure well. EKG at rest shows atrial fibrillation with controlled ventricular response. With  stress his heart rate went to 140. The EKG at rest shows poor R-wave progression V1 through V3. With stress there are no new ischemic changes.  The quality of the images is satisfactory. Perfusion imaging shows a small fixed apical defect consistent with old scar. No reversible ischemia is seen. Left ventricular end diastolic volume is 564 ml. The end systolic volume is 98 mL. The left ventricular ejection fraction is 31%. Wall motion analysis shows apical hypokinesis  IMPRESSION: Abnormal Lexiscan Myoview stress test. Small apical perfusion defect on rest and stress images consistent with old scar. No reversible  ischemia is seen. There is left ventricular dysfunction with ejection fraction 31% and apical hypokinesis.     Dg Chest Portable 1 View  06/25/2013   CLINICAL DATA:  Shortness of breath, fatigue.  EXAM: PORTABLE CHEST - 1 VIEW  COMPARISON:  02/22/2013  FINDINGS: Heart is upper limits normal in size. No confluent airspace opacities or effusions. No overt edema. No acute bony abnormality.  IMPRESSION: Borderline heart size.  No acute findings.      PROCEDURE: Left heart catheterization with selective coronary angiography, right heart catheterization.  INDICATIONS: CHF  The risks, benefits, and details of the procedure were explained to the patient. The patient verbalized understanding and wanted to proceed. Informed written consent was obtained.  PROCEDURE TECHNIQUE: After Xylocaine anesthesia a 59F slender sheath was placed in the right radial artery with a single anterior needle wall stick. Right coronary angiography was done using a Judkins R4 guide catheter. Left coronary angiography was done using a Judkins L3.5 guide catheter. Left ventriculography was done using a pigtail catheter. A TR band was used for hemostasis.  CONTRAST: Total of 50 cc.  COMPLICATIONS: None.  HEMODYNAMICS: Aortic pressure was 108/73; LV pressure was 107/10; LVEDP 24. There was no gradient between the left ventricle and aorta. Right atrial pressure 16/17, mean gradient or pressure 15 mmHg. RV pressure 35/12, RVEDP 15 mmHg. PA pressure 39/24, mean PA pressure 29 mmHg. Pulmonic capillary wedge pressure 25/26, mean pulmonary capillary wedge pressure 24 mmHg. Pulmonary artery saturation, 62%. Aortic saturation 91%. Cardiac output 5.5 L per minute. Cardiac index 2.3.  ANGIOGRAPHIC DATA: The left main coronary artery is widely patent.  The left anterior descending artery is a medium size vessel which does reach the apex. There is mild to moderate diffuse disease throughout the vessel. In the mid vessel, there is a focal 40%  stenosis. There is a large first diagonal. There is a focal 75% stenosis in the midportion of this diagonal.  The left circumflex artery is a large dominant vessel. In the mid vessel, there is a 50-60% stenosis. The first obtuse marginal is small. Is patent. Second obtuse marginal is medium size and appears widely patent. The third obtuse marginal is patent as well.  The right coronary artery is medium sized proximally. It is a nondominant vessel . There is a 50% lesion in the mid vessel. The distal vessel is very small.  LEFT VENTRICULOGRAM: Left ventricular angiogram was not done due to renal insufficiency. By echocardiogram, ejection fraction of 20%. LVEDP was 24 mmHg.  IMPRESSIONS:  1. Normal left main coronary artery. 2. Mild to moderate disease in the left anterior descending artery and its branches. There is a 75% first diagonal lesion.  3. Mild to moderate disease in the left circumflex artery and its branches. 4. Mild disease in the nondominant right coronary artery. 5. LVEDP 24 mmHg. Ejection fraction 20% by echocardiogram. 6. Mean pulmonary capillary wedge  pressure 24 mmHg. Pulmonary artery saturation, 62%. Aortic saturation 91%. Cardiac output 5.5 L per minute. Cardiac index 2.3.  RECOMMENDATION: Cardiomyopathy is well out of proportion to his degree of coronary artery disease. No revascularization is indicated at this time. Would reassess him clinically for ischemia after his LV function improves with medical therapy. Continue medical therapy for her LV systolic dysfunction. Will start anticoagulation at about 8:00 tonight for his atrial fibrillation. Per Dr. Ellyn Hack, plan will be for cardioversion on Monday. Long-term followup with Dr. Angelena Form.   2D ECHO: 06/25/2013 LV EF: 20% - 25% ------------------------------------------------------------ Study Conclusions - Left ventricle: The cavity size was normal. Wall thickness was normal. Systolic function was severely reduced.  The estimated ejection fraction was in the range of 20% to 25%. Diffuse hypokinesis. Doppler parameters are consistent with restrictive physiology, indicative of decreased left ventricular diastolic compliance and/or increased left atrial pressure. - Mitral valve: Mild regurgitation. - Left atrium: The atrium was moderately to severely dilated. - Right ventricle: The cavity size was mildly dilated. Systolic function was mildly reduced. - Right atrium: The atrium was moderately to severely dilated.   ASSESSMENT AND PLAN  Rainn Bullinger is a 73 y.o. male with a history of hypertension, obesity, prostate cancer and no past cardiac history who presented to [place] on 06/25/13 complaining of worsening DOE and chest tightness x 1 week. He was found to be in atrial fibrillation with RVR (HR 150s), started on a diltiazem drip, and transferred to [facility] for further care.  Atrial fibrillation with RVR. -- Atrial fibrillation rate is better controlled on amiodarone and digoxin. HR is 80s. He is feeling much improved. -- Continue Apixaban/Metoprolol and IV Amio. TEE guided cardioversion for today.  Acute combined systolic and diastolic heart failure- complicated by A. Fib. ECHO 06/25/13 with EF 20-25% and diffuse hypokinesis. Restrictive physiology on doppler. MR, LA dilation, RV dilation and RA dilation. -- Neg 4 L, weight reported as up 2lbs.  -- His creatinine is started to increase and lasix decreaed to 40mg  IV qd yesterday. Creat improved 1.72-->1.43. -- Continue low dose metoprolol for DCM and rate control.  -- ACEI held yesterday d/t to borderline low BP. BP now normal. Consider adding back.   Dilated Cardiomyopathy - EF 20-25% by echo -EF 20% by cath - cath showed 75% first diagonal, mild to moderate disease of the left circ and LAD and nondominant RCA.   HTN (hypertension) - ACE held yesterday d/t soft BPs. Normal now. Consider adding back in the setting of severe LV  dysfunction.  Hypokalemia - resolved. Continue to monitor   Signed, Perry Mount PA-C  Pager [phone]

## 2013-07-01 ENCOUNTER — Encounter (HOSPITAL_COMMUNITY): Payer: Self-pay | Admitting: Cardiology

## 2013-07-01 LAB — BASIC METABOLIC PANEL
BUN: 28 mg/dL — ABNORMAL HIGH (ref 6–23)
CALCIUM: 8.9 mg/dL (ref 8.4–10.5)
CO2: 23 meq/L (ref 19–32)
Chloride: 99 mEq/L (ref 96–112)
Creatinine, Ser: 1.34 mg/dL (ref 0.50–1.35)
GFR calc non Af Amer: 51 mL/min — ABNORMAL LOW (ref >90)
GFR, EST AFRICAN AMERICAN: 59 mL/min — AB (ref >90)
Glucose, Bld: 141 mg/dL — ABNORMAL HIGH (ref 70–99)
Potassium: 4.2 mEq/L (ref 3.7–5.3)
SODIUM: 136 meq/L — AB (ref 137–147)

## 2013-07-01 MED ORDER — AMIODARONE HCL 200 MG PO TABS
400.0000 mg | ORAL_TABLET | Freq: Two times a day (BID) | ORAL | Status: DC
  Administered 2013-07-01: 400 mg via ORAL
  Filled 2013-07-01 (×2): qty 2

## 2013-07-01 MED ORDER — DIGOXIN 125 MCG PO TABS
0.1250 mg | ORAL_TABLET | Freq: Every day | ORAL | Status: DC
  Administered 2013-07-01: 0.125 mg via ORAL
  Filled 2013-07-01: qty 1

## 2013-07-01 MED ORDER — AMIODARONE HCL 400 MG PO TABS: ORAL_TABLET | ORAL | Status: DC

## 2013-07-01 MED ORDER — FUROSEMIDE 40 MG PO TABS: 40.0000 mg | ORAL_TABLET | Freq: Every day | ORAL | Status: DC

## 2013-07-01 MED ORDER — APIXABAN 5 MG PO TABS: 5.0000 mg | ORAL_TABLET | Freq: Two times a day (BID) | ORAL | Status: DC

## 2013-07-01 MED ORDER — POTASSIUM CHLORIDE CRYS ER 20 MEQ PO TBCR: 20.0000 meq | EXTENDED_RELEASE_TABLET | Freq: Every day | ORAL | Status: DC

## 2013-07-01 MED ORDER — METOPROLOL TARTRATE 25 MG PO TABS: 25.0000 mg | ORAL_TABLET | Freq: Two times a day (BID) | ORAL | Status: DC

## 2013-07-01 NOTE — Progress Notes (Addendum)
The patient had failed electrical cardioversion and will continue amio load as OP. Needs f/u with Dr. Julianne Handler in 1 week. Amio 400 mg BID x 5 days then 200 mg BID thereafter. Digoxin stopped. Lasix, beta blocker and potassium will be continued. I did med recon for discharge. Patient wants 2 copies of d/c summary at discharge.

## 2013-07-01 NOTE — Discharge Summary (Signed)
Physician Discharge Summary  Patient ID: Jose Snyder MRN: [id number] DOB/AGE: 07-18-1940 73 y.o.  Admit date: 06/25/2013 Discharge date: 07/01/2013  Admission Diagnoses:  Atrial Fibrillation w/ RVR  Discharge Diagnoses:  Principal Problem:   Chest pain with moderate risk of acute coronary syndrome Active Problems:   Atrial fibrillation with rapid ventricular response   HTN (hypertension)   Dyspnea on exertion   History of prostate cancer   Acute combined systolic and diastolic heart failure, NYHA class 3; complicated by A. Fib - discharge weight 2 41 lbs. 14 oz.   Cardiomyopathy - EF 20-25% by echo   CAD (coronary artery disease), native coronary artery   Discharged Condition: Good  Hospital Course: Jose Snyder is a 73 y.o. male with a history of hypertension, obesity, prostate cancer and no past cardiac history who presented to [place] today complaining of worsening DOE and chest tightness x 1 week. He was found to be in atrial fibrillation with RVR (HR 150s), started on a diltiazem drip, and transferred to [facility] for further care.  His chest pain was managed medically with aspirin, heparin, and a beta blocker. He was not on a statin. His cardiac enzymes were negative for MI. He had a stress test that was abnormal. It showed a small apical perfusion defect consistent with old scar but no reversible ischemia. His EF was 31% with apical hypokinesis. An echocardiogram was performed that showed an EF of 20%. Because of the abnormal stress test and left ventricular dysfunction, cardiac catheterization was scheduled. This was performed on 05/01, results below. He had nonobstructive coronary artery disease and his mean pulmonary capillary wedge pressure was 24. The films were reviewed and he was felt to have left ventricular dysfunction out of proportion to his coronary artery disease. Medical therapy was recommended.  He was volume overloaded on admission, and diuresed with IV  Lasix. The Lasix was up-titrated to 80 mg IV twice a day for better diuresis. He lost 7 pounds during his hospital stay and his discharge weight is 241 pounds, 14 ounces. His volume status and renal function was monitored closely during his hospital stay and his potassium was supplemented as well.  He was initially on IV Cardizem for rate control of his atrial fibrillation. It was felt that he would do better in sinus rhythm, and he was scheduled for a TEE cardioversion which was performed on 05/04. There was no LA a thrombus on TEE but the cardioversion was unsuccessful. He was started on amiodarone and it was recommended that he load with amiodarone for 3 weeks prior to attempting cardioversion again. He was started on Eliquis for anticoagulation and is tolerating this well.  On 05/05, Jose Snyder was seen by Dr. Tamala Julian. All data were reviewed. No further inpatient workup is indicated and Jose Snyder is considered stable for discharge, with close outpatient followup arranged.   Consults: None  Significant Diagnostic Studies:   [facility] 4/29 LEFT VENTRICULOGRAM: Left ventricular angiogram was not done due to renal insufficiency. By echocardiogram, ejection fraction of 20%. LVEDP was 24 mmHg.  IMPRESSIONS:  1. Normal left main coronary artery. 2. Mild to moderate disease in the left anterior descending artery and its branches. There is a 75% first diagonal lesion.  3. Mild to moderate disease in the left circumflex artery and its branches. 4. Mild disease in the nondominant right coronary artery. 5. LVEDP 24 mmHg. Ejection fraction 20% by echocardiogram. 6. Mean pulmonary capillary wedge pressure 24 mmHg. Pulmonary artery saturation, 62%.  Aortic saturation 91%. Cardiac output 5.5 L per minute. Cardiac index 2.3.  RECOMMENDATION: Cardiomyopathy is well out of proportion to his degree of coronary artery disease. No revascularization is indicated at this time. Would reassess him clinically for ischemia after his  LV function improves with medical therapy. Continue medical therapy for her LV systolic dysfunction. Will start anticoagulation at about 8:00 tonight for his atrial fibrillation. Per Dr. Ellyn Hack, plan will be for cardioversion on Monday. Long-term followup with Dr. Angelena Form.  TEE/DCCV: 06/30/2013 Results:  Severe LV dysfunction EF 25%  Mildly dilated RV which is poorly visualized  Severely dilated RA with spontaneous echo contrast  Severely dilated LA with spontaneous echo contrast  Normal small LAA with no evidence of thrombus. Emptying velocity was reduced.  Normal trileaflet AV with trivial AR  Normal MV with mild MR  Normal PV  Normal TV  Normal interatrial septum by colorflow doppler  Normal thoracic and ascending aorta  After TEE confirmed no presence of Intracardic thrombus that patient was sedated further with propofol and a 150 watt synchronized biphasic shock was delivered which converted the patient to NSR. With in a minute that patient converted back to atrial fibrillation. A second shock was delivered at 200 watts which again converted the patient to NSR but then reverted back to atrial fibrillation.  Assessment:  1. Severe LV dysfunction  2. Severely dilated RA/LA with no evidence of thrombus by TEE 3. Initial successful DCCV to NSR but reverted back to atrial fibrillation after both shocks at 150watts and 200watts.  Plan:  1. Discussed with Dr. Tamala Julian and recommend 3 week load on PO amio prior to attempting repeat DCCV.  Treatments: See Hospital Course  Discharge Exam: Blood pressure 104/79, pulse 103, temperature 97.8 F (36.6 C), temperature source Oral, resp. rate 18, height 6\' 2"  (1.88 m), weight 241 lb 14.4 oz (109.725 kg), SpO2 98.00%.  Disposition: 01-Home or Self Care       Future Appointments Provider Department Dept Phone   07/09/2013 9:45 AM Wallene Huh, [place] [facility] [place]   07/15/2013 12:10 PM Liliane Shi, PA-C [place] Office [phone]       Medication List    STOP taking these medications       amLODipine 10 MG tablet  Commonly known as:  NORVASC     ibuprofen 800 MG tablet  Commonly known as:  ADVIL,MOTRIN     lisinopril-hydrochlorothiazide 10-12.5 MG per tablet  Commonly known as:  PRINZIDE,ZESTORETIC      TAKE these medications       amiodarone 400 MG tablet  Commonly known as:  PACERONE  Take 2 tablets twice daily for 5 days then 1 tablets twice daily thereafter.     apixaban 5 MG Tabs tablet  Commonly known as:  ELIQUIS  Take 1 tablet (5 mg total) by mouth 2 (two) times daily.     docusate sodium 100 MG capsule  Commonly known as:  COLACE  Take 100 mg by mouth 2 (two) times daily as needed for mild constipation.     furosemide 40 MG tablet  Commonly known as:  LASIX  Take 1 tablet (40 mg total) by mouth daily.     metoprolol tartrate 25 MG tablet  Commonly known as:  LOPRESSOR  Take 1 tablet (25 mg total) by mouth 2 (two) times daily.     omeprazole 20 MG capsule  Commonly known as:  PRILOSEC  Take 20 mg by  mouth daily.     potassium chloride SA 20 MEQ tablet  Commonly known as:  K-DUR,KLOR-CON  Take 1 tablet (20 mEq total) by mouth daily.     Vitamin D 2000 UNITS tablet  Take 2,000 Units by mouth daily.     zolpidem 10 MG tablet  Commonly known as:  AMBIEN  Take 10 mg by mouth at bedtime.       Follow-up Information   Follow up with Richardson Dopp, PA-C On 07/15/2013. (12:00 pm)    Specialty:  Physician Assistant   Contact information:   [address] [place] [address] [phone]      TIME SPENT ON DISCHARGE, INCLUDING PHYSICIAN TIME: >30 MINUTES  Signed: Lonn Georgia 07/01/2013, 5:30 PM

## 2013-07-01 NOTE — Progress Notes (Signed)
Discharge education and prescriptions given to and reviewed with pt, pt stated understanding and medication specifics were reviewed carefully, IV and tele removed, pt getting dressed and awaiting ride Rickard Rhymes, RN

## 2013-07-01 NOTE — Progress Notes (Signed)
Subjective: No complaints. Ready to go home.   Objective: Vital signs in last 24 hours: Temp:  [97.8 F (36.6 C)-98.3 F (36.8 C)] 97.8 F (36.6 C) (05/05 0341) Pulse Rate:  [83-109] 109 (05/05 0823) Resp:  [18-21] 18 (05/05 0341) BP: (104-132)/(65-87) 104/79 mmHg (05/05 0823) SpO2:  [97 %-99 %] 98 % (05/05 0341) Weight:  [241 lb 14.4 oz (109.725 kg)] 241 lb 14.4 oz (109.725 kg) (05/05 0500) Last BM Date: 06/30/13  Intake/Output from previous day: 05/04 0701 - 05/05 0700 In: 500 [I.V.:500] Out: 775 [Urine:775] Intake/Output this shift: Total I/O In: -  Out: 150 [Urine:150]  Medications Current Facility-Administered Medications  Medication Dose Route Frequency Provider Last Rate Last Dose  . 0.9 %  sodium chloride infusion   Intravenous Continuous Jettie Booze, MD 10 mL/hr at 06/30/13 2004    . 0.9 %  sodium chloride infusion   Intravenous Continuous [name], MD      . ALPRAZolam Duanne Moron) tablet 0.5 mg  0.5 mg Oral TID PRN Leonie Man, MD   0.5 mg at 06/30/13 2101  . amiodarone IV infusion  30 mg/hr Intravenous Continuous Leonie Man, MD 16.7 mL/hr at 06/30/13 2132 30 mg/hr at 06/30/13 2132  . apixaban (ELIQUIS) tablet 5 mg  5 mg Oral BID Burnell Blanks, MD   5 mg at 06/30/13 2101  . digoxin (LANOXIN) 0.25 MG/ML injection 0.125 mg  0.125 mg Intravenous Daily Burnell Blanks, MD      . docusate sodium (COLACE) capsule 100 mg  100 mg Oral BID Perry Mount, PA-C   100 mg at 06/30/13 2101  . fluticasone (FLONASE) 50 MCG/ACT nasal spray 2 spray  2 spray Each Nare Daily Burnell Blanks, MD   2 spray at 06/30/13 1015  . furosemide (LASIX) injection 40 mg  40 mg Intravenous Daily [name], MD   40 mg at 06/30/13 1015  . furosemide (LASIX) tablet 40 mg  40 mg Oral Daily Belva Crome III, MD      . metoprolol tartrate (LOPRESSOR) tablet 25 mg  25 mg Oral BID Leonie Man, MD   25 mg at 06/30/13 2132  . pantoprazole (PROTONIX) EC  tablet 40 mg  40 mg Oral Daily Perry Mount, PA-C   40 mg at 06/30/13 1014  . potassium chloride SA (K-DUR,KLOR-CON) CR tablet 20 mEq  20 mEq Oral Daily [name], MD   20 mEq at 06/30/13 1014  . zolpidem (AMBIEN) tablet 5 mg  5 mg Oral QHS PRN Perry Mount, PA-C   5 mg at 06/25/13 2348    PE: General appearance: alert, cooperative and no distress Lungs: clear to auscultation bilaterally Heart: irregularly irregular rhythm Extremities: no LEE Pulses: 2+ and symmetric Skin: warm and dry Neurologic: Grossly normal  Lab Results:  No results found for this basename: WBC, HGB, HCT, PLT,  in the last 72 hours BMET  Recent Labs  06/29/13 0500 06/30/13 0544 07/01/13 0530  NA 138 139 136*  K 3.6* 3.8 4.2  CL 97 99 99  CO2 28 27 23   GLUCOSE 118* 124* 141*  BUN 33* 33* 28*  CREATININE 1.72* 1.43* 1.34  CALCIUM 9.0 9.1 8.9     Assessment/Plan  Principal Problem:   Chest pain with moderate risk of acute coronary syndrome Active Problems:   Atrial fibrillation with rapid ventricular response   HTN (hypertension)   Dyspnea on exertion   History of prostate cancer  Acute combined systolic and diastolic heart failure, NYHA class 3; complicated by A. fib   Cardiomyopathy - EF 20-25% by echo   CAD (coronary artery disease), native coronary artery  1. Atrial Fibrillation -  Initial successful DCCV to NSR but reverted back to atrial fibrillation after both shocks at 150watts and 200watts. Still in Afib, but ventricular rate is better controlled, in the 80s, after addition of amiodarone. Continue Amiodarone load with conversion to PO today and plan to repeat DCCV in several weeks.   2. Chronic Oral Anticoagulation - Continue Eliquis BID  3. Cardiomyopathy - EF 20-30%. LHC revealed mild to moderate LAD, left circumflex and RCA. However, cardiomyopathy is well out of proportion to his degree of CAD. No revascularization was indicated. Continue medical therapy. Lasix, Metoprolol,  digoxin. As BP improves, he will benefit from an ACE/ARB   LOS: 6 days    Kalyn Hofstra M. Rosita Fire, PA-C 07/01/2013 8:44 AM

## 2013-07-03 NOTE — Discharge Summary (Signed)
The patient had failed electrical cardioversion and will continue amio load as OP. Needs f/u with Dr. Julianne Handler in 1 week. Amio 400 mg BID x 5 days then 200 mg BID thereafter. Digoxin stopped. Lasix, beta blocker and potassium will be continued. I did med recon for discharge. Patient wants 2 copies of d/c summary at discharge. The note as written in his accurate.

## 2013-07-09 ENCOUNTER — Encounter: Payer: Self-pay | Admitting: Podiatry

## 2013-07-09 ENCOUNTER — Ambulatory Visit (INDEPENDENT_AMBULATORY_CARE_PROVIDER_SITE_OTHER): Payer: Medicare Other | Admitting: Podiatry

## 2013-07-09 VITALS — BP 108/68 | HR 83 | Resp 16

## 2013-07-09 DIAGNOSIS — B351 Tinea unguium: Secondary | ICD-10-CM | POA: Diagnosis not present

## 2013-07-09 DIAGNOSIS — M79609 Pain in unspecified limb: Secondary | ICD-10-CM | POA: Diagnosis not present

## 2013-07-09 NOTE — Progress Notes (Signed)
Subjective:     Patient ID: Jose Snyder, male   DOB: [birth year], 73 y.o.   MRN: [id number]  HPI patient has thick painful nail bed 1-5 both feet he cannot cut   Review of Systems     Objective:   Physical Exam Neurovascular status intact history changes with thick dystrophic nailbeds b1 to 5 both feet and yellow in appearance and painful    Assessment:     Mycotic nail infection with pain 1-5 both feet    Plan:     Debridement painful nail bed 1-5 both feet with no bleeding noted

## 2013-07-10 ENCOUNTER — Encounter: Payer: Self-pay | Admitting: *Deleted

## 2013-07-15 ENCOUNTER — Ambulatory Visit (INDEPENDENT_AMBULATORY_CARE_PROVIDER_SITE_OTHER): Payer: Medicare Other | Admitting: Physician Assistant

## 2013-07-15 ENCOUNTER — Telehealth: Payer: Self-pay | Admitting: *Deleted

## 2013-07-15 ENCOUNTER — Encounter: Payer: Self-pay | Admitting: Physician Assistant

## 2013-07-15 VITALS — BP 100/60 | HR 87 | Ht 74.0 in | Wt 245.0 lb

## 2013-07-15 DIAGNOSIS — I4891 Unspecified atrial fibrillation: Secondary | ICD-10-CM

## 2013-07-15 DIAGNOSIS — I5042 Chronic combined systolic (congestive) and diastolic (congestive) heart failure: Secondary | ICD-10-CM | POA: Diagnosis not present

## 2013-07-15 DIAGNOSIS — I2510 Atherosclerotic heart disease of native coronary artery without angina pectoris: Secondary | ICD-10-CM

## 2013-07-15 DIAGNOSIS — E785 Hyperlipidemia, unspecified: Secondary | ICD-10-CM | POA: Insufficient documentation

## 2013-07-15 DIAGNOSIS — I428 Other cardiomyopathies: Secondary | ICD-10-CM | POA: Diagnosis not present

## 2013-07-15 DIAGNOSIS — I10 Essential (primary) hypertension: Secondary | ICD-10-CM

## 2013-07-15 HISTORY — DX: Hyperlipidemia, unspecified: E78.5

## 2013-07-15 LAB — BASIC METABOLIC PANEL
BUN: 21 mg/dL (ref 6–23)
CO2: 26 mEq/L (ref 19–32)
Calcium: 9.3 mg/dL (ref 8.4–10.5)
Chloride: 99 mEq/L (ref 96–112)
Creatinine, Ser: 1.7 mg/dL — ABNORMAL HIGH (ref 0.4–1.5)
GFR: 42.01 mL/min — AB (ref >60.00)
Glucose, Bld: 101 mg/dL — ABNORMAL HIGH (ref 70–99)
POTASSIUM: 4.8 meq/L (ref 3.5–5.1)
SODIUM: 135 meq/L (ref 135–145)

## 2013-07-15 MED ORDER — AMIODARONE HCL 200 MG PO TABS: 200.0000 mg | ORAL_TABLET | Freq: Two times a day (BID) | ORAL | Status: DC

## 2013-07-15 MED ORDER — ATORVASTATIN CALCIUM 20 MG PO TABS: 20.0000 mg | ORAL_TABLET | Freq: Every day | ORAL | Status: DC

## 2013-07-15 NOTE — Progress Notes (Signed)
[place], [place], [place]  [address] Phone: [phone] Fax:  [phone]  Date:  07/15/2013   ID:  Jose Snyder, DOB 04-08-1940, MRN [id number]  PCP:  Verline Lema, MD  Cardiologist:  Dr. Lauree Chandler => Dr. Kirk Ruths     History of Present Illness: Jose Snyder is a 73 y.o. male with a hx of HTN, obesity, prostate CA.  He was admitted 4/29-5/5 with AFib with RVR.  CEs were negative.  Inpatient Myoview was abnormal with low EF and apical scar.  Echo confirmed EF 20-25%.  LHC demonstrated mod non-obs CAD.  Most significant lesion was a mid D1 with 75% stenosis.  DCM was felt to be out of proportion to CAD (NICM).  He was diuresed with IV Lasix.  He was placed on amiodarone, metoprolol, Apixaban.  He underwent TEE-DCCV on 06/30/13.  This was unsuccessful.  Therefore, it was felt that he should remain on Amiodarone for 3 weeks before re-attempt at [place].  He returns for follow up.  He is feeling much better. He describes NYHA class 2b symptoms. He denies orthopnea, PND or edema. He denies chest discomfort. He denies syncope.   Studies:  - LHC (06/27/13):  Mid LAD 40%, mid D1 75%, mid CFX 50-60%, mid RCA 50%, EF 20%, LVEDP 24 mmHg.  - Echo (06/25/13):  EF 20-25%, diff HK, restrictive physio, mild MR, mod to severe LAE, mild RVE, mildly reduced RVSF, mod to severe RAE.    - Nuclear (06/26/13):  Apical scar, no ischemia, EF 31%, apical HK    Recent Labs: 06/25/2013: Pro B Natriuretic peptide (BNP) 2184.0*; TSH 1.590  06/26/2013: HDL Cholesterol by NMR 33*; Hemoglobin 14.5; LDL (calc) 81  07/01/2013: Creatinine 1.34; Potassium 4.2   Wt Readings from Last 3 Encounters:  07/15/13 245 lb (111.131 kg)  07/01/13 241 lb 14.4 oz (109.725 kg)  07/01/13 241 lb 14.4 oz (109.725 kg)     Past Medical History  Diagnosis Date  . Hypertension   . Obesity   . Prostate cancer   . GERD (gastroesophageal reflux disease)   . Chronic combined systolic and diastolic heart failure  0/10/3816    a.  Echo (06/25/13):  EF 20-25%, diff HK, restrictive physio, mild MR, mod to severe LAE, mild RVE, mildly reduced RVSF, mod to severe RAE  . NICM (nonischemic cardiomyopathy) with EF 20-25% 06/26/2013  . Atrial Fibrillation  06/25/2013  . Dyslipidemia 07/15/2013  . CAD (coronary artery disease), native coronary artery 06/28/2013    a. LHC (06/27/13):  Mid LAD 40%, mid D1 75%, mid CFX 50-60%, mid RCA 50%, EF 20%, LVEDP 24 mmHg.    Current Outpatient Prescriptions  Medication Sig Dispense Refill  . amiodarone (PACERONE) 400 MG tablet Take 2 tablets twice daily for 5 days then 1 tablets twice daily thereafter.  66 tablet  10  . apixaban (ELIQUIS) 5 MG TABS tablet Take 1 tablet (5 mg total) by mouth 2 (two) times daily.  60 tablet  11  . Cholecalciferol (VITAMIN D) 2000 UNITS tablet Take 2,000 Units by mouth daily.      Marland Kitchen docusate sodium (COLACE) 100 MG capsule Take 100 mg by mouth 2 (two) times daily as needed for mild constipation.       . furosemide (LASIX) 40 MG tablet Take 1 tablet (40 mg total) by mouth daily.  30 tablet  11  . metoprolol tartrate (LOPRESSOR) 25 MG tablet Take 1 tablet (25 mg total) by mouth 2 (two)  times daily.  60 tablet  11  . omeprazole (PRILOSEC) 20 MG capsule Take 20 mg by mouth daily.      . potassium chloride SA (K-DUR,KLOR-CON) 20 MEQ tablet Take 1 tablet (20 mEq total) by mouth daily.  30 tablet  11  . zolpidem (AMBIEN) 10 MG tablet Take 10 mg by mouth at bedtime.        No current facility-administered medications for this visit.    Allergies:   Review of patient's allergies indicates no known allergies.   Social History:  The patient  reports that he has never smoked. He has never used smokeless tobacco. He reports that he drinks about 8.4 ounces of alcohol per week. He reports that he does not use illicit drugs.   Family History:  The patient's family history is not on file.   ROS:  Please see the history of present illness.      All other systems  reviewed and negative.   PHYSICAL EXAM: VS:  BP 100/60  Pulse 87  Ht 6\' 2"  (1.88 m)  Wt 245 lb (111.131 kg)  BMI 31.44 kg/m2 Well nourished, well developed, in no acute distress HEENT: normal Neck: no JVD Cardiac:  normal S1, S2; irregularly irregular rhythm; no murmur Lungs:  clear to auscultation bilaterally, no wheezing, rhonchi or rales Abd: soft, nontender, no hepatomegaly Ext: no edemaright wrist without hematoma or mass  Skin: warm and dry Neuro:  CNs 2-12 intact, no focal abnormalities noted  EKG:  Coarse atrial fibrillation versus atrial flutter, HR 87     ASSESSMENT AND PLAN:  1. Atrial Fibrillation: He remains in atrial fibrillation with controlled ventricular response. It has been about 2 weeks since he was discharged from the hospital. I have recommended that he remain on Eliquis and his current dose of amiodarone for at least 2 more weeks. We will plan on follow up at that time. If he remains in atrial fibrillation, proceed with repeat cardioversion.  CHADS2-VASc=4.  He will require long-term anticoagulation.  He does have some daytime hypersomnolence. He lives alone, therefore he is not sure if he snores. He likely has sleep apnea. I will arrange a sleep study. 2. Chronic combined systolic and diastolic heart failure: Volume stable. Continue current therapy. Check a basic metabolic panel today. 3. NICM (nonischemic cardiomyopathy) with EF 20-25%: Continue beta blocker. Blood pressure is too soft to add ACE inhibitor at this time. He likely has a tachycardia induced cardiomyopathy. His ejection fraction will need to be reassessed after restoration of NSR. 4. CAD (coronary artery disease), native coronary artery: No angina. He is not on aspirin as he is on Eliquis. Start statin. Lipitor 20 mg daily. 5. HTN (hypertension): Controlled. 6. Dyslipidemia: Start Lipitor 20 mg daily. Check lipids and LFTs in 8 weeks. 7. Disposition: He prefers to follow up in [facility]. I will  arrange follow up with Dr. Stanford Breed in 2-3 weeks.   Signed, Richardson Dopp, PA-C  07/15/2013 12:20 PM

## 2013-07-15 NOTE — Patient Instructions (Signed)
Your physician has recommended you make the following change in your medication: START LIPITOR 20 MG DAILY; RX SENT IN TODAY  LAB WORK TODAY; BMET ONLY  YOU WILL NEED A FASTING CHOLESTEROL PANEL TO BE DONE IN 8 WEEKS; [facility] WOULD LIKE TO [facility] PANEL THERE; IF SO PLEASE HAVE RESULTS FAXED TO [place], [place]  Your physician has recommended that you have a sleep study. This test records several body functions during sleep, including: brain activity, eye movement, oxygen and carbon dioxide blood levels, heart rate and rhythm, breathing rate and rhythm, the flow of air through your mouth and nose, snoring, body muscle movements, and chest and belly movement.  Your physician recommends that you schedule a follow-up appointment in: 2-3 [place] DR. CRENSHAW IN THE [place] OFFICE

## 2013-07-15 NOTE — Telephone Encounter (Signed)
lmptcb to go over lab results and recommendations. I will try again tomorrow.

## 2013-07-16 NOTE — Telephone Encounter (Signed)
pt notified about his lab results. Pt verbalized understanding. Pt asked if he could have his repeat BMET in [place] at [facility]. I said that would be fine. Bmet 07/25/13. Rx faxed over to [place] at the [facility] fax # [phone] with instructions to fax results results to [place], [place].

## 2013-07-16 NOTE — Telephone Encounter (Signed)
Follow Up ° °Pt returned call for results. Please call  °

## 2013-07-22 ENCOUNTER — Telehealth: Payer: Self-pay | Admitting: *Deleted

## 2013-07-22 DIAGNOSIS — I4891 Unspecified atrial fibrillation: Secondary | ICD-10-CM

## 2013-07-22 MED ORDER — AMIODARONE HCL 200 MG PO TABS: 200.0000 mg | ORAL_TABLET | Freq: Every day | ORAL | Status: DC

## 2013-07-22 NOTE — Telephone Encounter (Signed)
Spoke with pt, he reports upset stomach and diarrhea. Advised pt to decrease amiodarone to 200 mg once daily Patient voiced understanding

## 2013-07-25 ENCOUNTER — Telehealth: Payer: Self-pay | Admitting: *Deleted

## 2013-07-25 NOTE — Telephone Encounter (Addendum)
Spoke with pt, he cont to have bouts of diarrhea after reducing the amiodarone to 200 mg once daily. He is also having a lot of fatigue, by 6 pm he is exhausted.  He feels it is related to his meds. He does get a little SOB with exertion but it is greatly improved. He has been doing research and is really interested in ablation if that is something he can get. Will forward for dr Stanford Breed review

## 2013-07-25 NOTE — Telephone Encounter (Signed)
Spoke with pt, Aware of dr crenshaw's recommendations.  °

## 2013-07-25 NOTE — Telephone Encounter (Signed)
FU as scheduled Jose Snyder

## 2013-07-30 ENCOUNTER — Encounter: Payer: Self-pay | Admitting: *Deleted

## 2013-07-30 ENCOUNTER — Other Ambulatory Visit: Payer: Self-pay | Admitting: Cardiology

## 2013-07-30 ENCOUNTER — Encounter: Payer: Self-pay | Admitting: Cardiology

## 2013-07-30 ENCOUNTER — Ambulatory Visit (INDEPENDENT_AMBULATORY_CARE_PROVIDER_SITE_OTHER): Payer: Medicare Other | Admitting: Cardiology

## 2013-07-30 VITALS — BP 130/62 | HR 128 | Ht 74.0 in | Wt 237.1 lb

## 2013-07-30 DIAGNOSIS — I5042 Chronic combined systolic (congestive) and diastolic (congestive) heart failure: Secondary | ICD-10-CM | POA: Diagnosis not present

## 2013-07-30 DIAGNOSIS — I4891 Unspecified atrial fibrillation: Secondary | ICD-10-CM

## 2013-07-30 DIAGNOSIS — I428 Other cardiomyopathies: Secondary | ICD-10-CM | POA: Diagnosis not present

## 2013-07-30 DIAGNOSIS — I10 Essential (primary) hypertension: Secondary | ICD-10-CM | POA: Diagnosis not present

## 2013-07-30 DIAGNOSIS — I2510 Atherosclerotic heart disease of native coronary artery without angina pectoris: Secondary | ICD-10-CM | POA: Diagnosis not present

## 2013-07-30 DIAGNOSIS — I4892 Unspecified atrial flutter: Secondary | ICD-10-CM

## 2013-07-30 LAB — CBC
HEMATOCRIT: 41.4 % (ref 39.0–52.0)
HEMOGLOBIN: 13.9 g/dL (ref 13.0–17.0)
MCH: 29.7 pg (ref 26.0–34.0)
MCHC: 33.6 g/dL (ref 30.0–36.0)
MCV: 88.5 fL (ref 78.0–100.0)
Platelets: 355 10*3/uL (ref 150–400)
RBC: 4.68 MIL/uL (ref 4.22–5.81)
RDW: 14 % (ref 11.5–15.5)
WBC: 8.8 10*3/uL (ref 4.0–10.5)

## 2013-07-30 LAB — BASIC METABOLIC PANEL WITH GFR
BUN: 24 mg/dL — ABNORMAL HIGH (ref 6–23)
CHLORIDE: 101 meq/L (ref 96–112)
CO2: 24 meq/L (ref 19–32)
CREATININE: 1.4 mg/dL — AB (ref 0.50–1.35)
Calcium: 9.1 mg/dL (ref 8.4–10.5)
GFR, Est African American: 58 mL/min — ABNORMAL LOW
GFR, Est Non African American: 50 mL/min — ABNORMAL LOW
Glucose, Bld: 172 mg/dL — ABNORMAL HIGH (ref 70–99)
POTASSIUM: 5.1 meq/L (ref 3.5–5.3)
SODIUM: 136 meq/L (ref 135–145)

## 2013-07-30 NOTE — Patient Instructions (Signed)
Your physician recommends that you schedule a follow-up appointment in: [place] recommends that YOU HAVE LAB WORK TODAY  Your physician has recommended that you have a Cardioversion (DCCV). Electrical Cardioversion uses a jolt of electricity to your heart either through paddles or wired patches attached to your chest. This is a controlled, usually prescheduled, procedure. Defibrillation is done under light anesthesia in the hospital, and you usually go home the day of the procedure. This is done to get your heart back into a normal rhythm. You are not awake for the procedure. Please see the instruction sheet given to you today.

## 2013-07-30 NOTE — Assessment & Plan Note (Signed)
Previous electrocardiograms reviewed and noted to be atrial fibrillation. He is now in atrial flutter. He is extremely symptomatic with dyspnea on exertion and fatigue. He did not hold sinus rhythm with previous cardioversion and amiodarone was initiated. He is having some side effects including nausea and tremor. His dose was decreased to 200 mg daily and his symptoms are somewhat improved. Plan to continue amiodarone at present dose. Continue metoprolol. Continue apixaban. Plan to proceed with cardioversion tomorrow as he is very symptomatic. Hopefully he will hold sinus rhythm. If not we will need to consider referral to Dr. Rayann Heman for ablation. If he continues to have nausea and tremor despite lower dose of amiodarone we may need to discontinue this medication. He is also having fatigue which is most likely related to his atrial fibrillation. If this persists despite reestablishing sinus rhythm we will need to consider discontinuing beta blocker.

## 2013-07-30 NOTE — Assessment & Plan Note (Signed)
Continue statin. Not on aspirinGiven need for anti-coagulation.

## 2013-07-30 NOTE — Assessment & Plan Note (Signed)
Cardiomyopathy is most likely tachycardia mediated. Proceed with cardioversion.Continue beta blocker for now. I will consider adding an ACE inhibitor later but recent creatinine 1.7 and we will need to make sure his renal function is stable first. Check hemoglobin and renal function.

## 2013-07-30 NOTE — Progress Notes (Signed)
HPI: FU atrial fibrillation. He was admitted 4/15 with AFib with RVR. CEs were negative. Inpatient Myoview was abnormal with low EF and apical scar. Echo confirmed EF 20-25%. LHC demonstrated mod non-obs CAD. Most significant lesion was a mid D1 with 75% stenosis. DCM was felt to be out of proportion to CAD (NICM). He was diuresed with IV Lasix. He was placed on amiodarone, metoprolol, Apixaban. He underwent TEE-DCCV on 06/30/13. This was unsuccessful. Therefore, it was felt that he should remain on Amiodarone for 3 weeks before re-attempt at [place]. Studies:  - LHC (06/27/13): Mid LAD 40%, mid D1 75%, mid CFX 50-60%, mid RCA 50%, EF 20%, LVEDP 24 mmHg.  - Echo (06/25/13): EF 20-25%, diff HK, restrictive physio, mild MR, mod to severe LAE, mild RVE, mildly reduced RVSF, mod to severe RAE.  - Nuclear (06/26/13): Apical scar, no ischemia, EF 31%, apical HK  Since last seen, He does have continued dyspnea on exertion but no orthopnea, PND, pedal edema, palpitations, syncope or exertional chest pain. He does describe nausea which has improved with lower dose of amiodarone. He also has a mild tremor.     Current Outpatient Prescriptions  Medication Sig Dispense Refill  . amiodarone (PACERONE) 200 MG tablet Take 1 tablet (200 mg total) by mouth daily.      Marland Kitchen apixaban (ELIQUIS) 5 MG TABS tablet Take 1 tablet (5 mg total) by mouth 2 (two) times daily.  60 tablet  11  . docusate sodium (COLACE) 100 MG capsule Take 100 mg by mouth 2 (two) times daily as needed for mild constipation.       . furosemide (LASIX) 40 MG tablet Take 1 tablet (40 mg total) by mouth daily.  30 tablet  11  . metoprolol tartrate (LOPRESSOR) 25 MG tablet Take 1 tablet (25 mg total) by mouth 2 (two) times daily.  60 tablet  11  . omeprazole (PRILOSEC) 20 MG capsule Take 20 mg by mouth daily.      . potassium chloride SA (K-DUR,KLOR-CON) 20 MEQ tablet Take 1 tablet (20 mEq total) by mouth daily.  30 tablet  11  . zolpidem (AMBIEN) 10  MG tablet Take 10 mg by mouth as needed.       Marland Kitchen atorvastatin (LIPITOR) 20 MG tablet Take 1 tablet (20 mg total) by mouth daily.  30 tablet  11  . Cholecalciferol (VITAMIN D) 2000 UNITS tablet Take 2,000 Units by mouth daily.       No current facility-administered medications for this visit.     Past Medical History  Diagnosis Date  . Hypertension   . Obesity   . Prostate cancer   . GERD (gastroesophageal reflux disease)   . Chronic combined systolic and diastolic heart failure 05/12/4006    a.  Echo (06/25/13):  EF 20-25%, diff HK, restrictive physio, mild MR, mod to severe LAE, mild RVE, mildly reduced RVSF, mod to severe RAE  . NICM (nonischemic cardiomyopathy) with EF 20-25% 06/26/2013  . Atrial Fibrillation  06/25/2013  . Dyslipidemia 07/15/2013  . CAD (coronary artery disease), native coronary artery 06/28/2013    a. LHC (06/27/13):  Mid LAD 40%, mid D1 75%, mid CFX 50-60%, mid RCA 50%, EF 20%, LVEDP 24 mmHg.    Past Surgical History  Procedure Laterality Date  . Medial partial knee replacement Left 1990's?  . Transurethral resection of prostate  2010  . Tee without cardioversion N/A 06/30/2013    Procedure: TRANSESOPHAGEAL ECHOCARDIOGRAM (TEE) ;  Surgeon:  [name], MD;  Location: [facility];  Service: Cardiovascular;  Laterality: N/A;  . Cardioversion N/A 06/30/2013    Procedure: CARDIOVERSION;  Surgeon: [name], MD;  Location: [facility];  Service: Cardiovascular;  Laterality: N/A;    History   Social History  . Marital Status: Married    Spouse Name: N/A    Number of Children: N/A  . Years of Education: N/A   Occupational History  . Not on file.   Social History Main Topics  . Smoking status: Never Smoker   . Smokeless tobacco: Never Used     Comment: 06/25/2013 "smoked very light; years and years ago"  . Alcohol Use: 8.4 oz/week    7 Glasses of wine, 7 Shots of liquor per week  . Drug Use: No  . Sexual Activity: Not Currently   Other Topics Concern    . Not on file   Social History Narrative   He lives in [place]. He is widowed. Wife passed of breast cancer last year. No children.     ROS: no fevers or chills, productive cough, hemoptysis, dysphasia, odynophagia, melena, hematochezia, dysuria, hematuria, rash, seizure activity, orthopnea, PND, pedal edema, claudication. Remaining systems are negative.  Physical Exam: Well-developed well-nourished in no acute distress.  Skin is warm and dry.  HEENT is normal.  Neck is supple.  Chest is clear to auscultation with normal expansion.  Cardiovascular exam is Tachycardic and irregular. Abdominal exam nontender or distended. No masses palpated. Extremities show no edema. neuro grossly intact  ECG Atrial flutter at a rate of 128. Cannot rule out prior septal infarct. Right axis deviation.

## 2013-07-30 NOTE — Assessment & Plan Note (Signed)
Continue present dose of Lasix. Check potassium and renal function. 

## 2013-07-30 NOTE — Assessment & Plan Note (Signed)
Blood pressure controlled. Continue present medications. 

## 2013-07-31 ENCOUNTER — Ambulatory Visit (HOSPITAL_COMMUNITY)
Admission: RE | Admit: 2013-07-31 | Discharge: 2013-07-31 | Disposition: A | Discharge status: Home / Self Care | Payer: Medicare Other | Source: Ambulatory Visit | Attending: Cardiovascular Disease | Admitting: Cardiovascular Disease

## 2013-07-31 ENCOUNTER — Encounter (HOSPITAL_COMMUNITY)
Admission: RE | Disposition: A | Discharge status: Home / Self Care | Payer: Medicare Other | Source: Ambulatory Visit | Attending: Cardiovascular Disease

## 2013-07-31 ENCOUNTER — Encounter (HOSPITAL_COMMUNITY): Payer: Medicare Other | Admitting: Certified Registered"

## 2013-07-31 ENCOUNTER — Ambulatory Visit (HOSPITAL_COMMUNITY): Payer: Medicare Other | Admitting: Certified Registered"

## 2013-07-31 ENCOUNTER — Encounter (HOSPITAL_COMMUNITY): Payer: Self-pay | Admitting: *Deleted

## 2013-07-31 DIAGNOSIS — I2510 Atherosclerotic heart disease of native coronary artery without angina pectoris: Secondary | ICD-10-CM | POA: Insufficient documentation

## 2013-07-31 DIAGNOSIS — I428 Other cardiomyopathies: Secondary | ICD-10-CM | POA: Insufficient documentation

## 2013-07-31 DIAGNOSIS — I509 Heart failure, unspecified: Secondary | ICD-10-CM | POA: Insufficient documentation

## 2013-07-31 DIAGNOSIS — I4892 Unspecified atrial flutter: Secondary | ICD-10-CM

## 2013-07-31 DIAGNOSIS — E669 Obesity, unspecified: Secondary | ICD-10-CM | POA: Insufficient documentation

## 2013-07-31 DIAGNOSIS — I10 Essential (primary) hypertension: Secondary | ICD-10-CM | POA: Diagnosis not present

## 2013-07-31 DIAGNOSIS — Z79899 Other long term (current) drug therapy: Secondary | ICD-10-CM | POA: Diagnosis not present

## 2013-07-31 DIAGNOSIS — K219 Gastro-esophageal reflux disease without esophagitis: Secondary | ICD-10-CM | POA: Diagnosis not present

## 2013-07-31 DIAGNOSIS — I5042 Chronic combined systolic (congestive) and diastolic (congestive) heart failure: Secondary | ICD-10-CM | POA: Insufficient documentation

## 2013-07-31 DIAGNOSIS — E785 Hyperlipidemia, unspecified: Secondary | ICD-10-CM | POA: Insufficient documentation

## 2013-07-31 DIAGNOSIS — I4891 Unspecified atrial fibrillation: Secondary | ICD-10-CM | POA: Diagnosis not present

## 2013-07-31 DIAGNOSIS — Z8546 Personal history of malignant neoplasm of prostate: Secondary | ICD-10-CM | POA: Diagnosis not present

## 2013-07-31 HISTORY — PX: CARDIOVERSION: SHX1299

## 2013-07-31 SURGERY — CARDIOVERSION
Anesthesia: Monitor Anesthesia Care

## 2013-07-31 MED ORDER — SODIUM CHLORIDE 0.9 % IV SOLN
250.0000 mL | INTRAVENOUS | Status: DC
  Administered 2013-07-31: 250 mL via INTRAVENOUS

## 2013-07-31 MED ORDER — SODIUM CHLORIDE 0.9 % IJ SOLN: 3.0000 mL | Freq: Two times a day (BID) | INTRAMUSCULAR | Status: DC

## 2013-07-31 MED ORDER — PROPOFOL 10 MG/ML IV BOLUS
INTRAVENOUS | Status: DC | PRN
  Administered 2013-07-31: 50 mg via INTRAVENOUS
  Administered 2013-07-31: 40 mg via INTRAVENOUS

## 2013-07-31 MED ORDER — SODIUM CHLORIDE 0.9 % IJ SOLN: 3.0000 mL | INTRAMUSCULAR | Status: DC | PRN

## 2013-07-31 NOTE — Anesthesia Postprocedure Evaluation (Signed)
Anesthesia Post Note  Patient: Jose Snyder  Procedure(s) Performed: Procedure(s) (LRB): CARDIOVERSION (N/A)  Anesthesia type: General  Patient location: PACU  Post pain: Pain level controlled and Adequate analgesia  Post assessment: Post-op Vital signs reviewed, Patient's Cardiovascular Status Stable, Respiratory Function Stable, Patent Airway and Pain level controlled  Last Vitals:  Filed Vitals:   07/31/13 1130  BP: 101/71  Pulse: 96  Temp:   Resp: 28    Post vital signs: Reviewed and stable  Level of consciousness: awake, alert  and oriented  Complications: No apparent anesthesia complications

## 2013-07-31 NOTE — H&P (View-Only) (Signed)
HPI: FU atrial fibrillation. He was admitted 4/15 with AFib with RVR. CEs were negative. Inpatient Myoview was abnormal with low EF and apical scar. Echo confirmed EF 20-25%. LHC demonstrated mod non-obs CAD. Most significant lesion was a mid D1 with 75% stenosis. DCM was felt to be out of proportion to CAD (NICM). He was diuresed with IV Lasix. He was placed on amiodarone, metoprolol, Apixaban. He underwent TEE-DCCV on 06/30/13. This was unsuccessful. Therefore, it was felt that he should remain on Amiodarone for 3 weeks before re-attempt at [place]. Studies:  - LHC (06/27/13): Mid LAD 40%, mid D1 75%, mid CFX 50-60%, mid RCA 50%, EF 20%, LVEDP 24 mmHg.  - Echo (06/25/13): EF 20-25%, diff HK, restrictive physio, mild MR, mod to severe LAE, mild RVE, mildly reduced RVSF, mod to severe RAE.  - Nuclear (06/26/13): Apical scar, no ischemia, EF 31%, apical HK  Since last seen, He does have continued dyspnea on exertion but no orthopnea, PND, pedal edema, palpitations, syncope or exertional chest pain. He does describe nausea which has improved with lower dose of amiodarone. He also has a mild tremor.     Current Outpatient Prescriptions  Medication Sig Dispense Refill  . amiodarone (PACERONE) 200 MG tablet Take 1 tablet (200 mg total) by mouth daily.      Marland Kitchen apixaban (ELIQUIS) 5 MG TABS tablet Take 1 tablet (5 mg total) by mouth 2 (two) times daily.  60 tablet  11  . docusate sodium (COLACE) 100 MG capsule Take 100 mg by mouth 2 (two) times daily as needed for mild constipation.       . furosemide (LASIX) 40 MG tablet Take 1 tablet (40 mg total) by mouth daily.  30 tablet  11  . metoprolol tartrate (LOPRESSOR) 25 MG tablet Take 1 tablet (25 mg total) by mouth 2 (two) times daily.  60 tablet  11  . omeprazole (PRILOSEC) 20 MG capsule Take 20 mg by mouth daily.      . potassium chloride SA (K-DUR,KLOR-CON) 20 MEQ tablet Take 1 tablet (20 mEq total) by mouth daily.  30 tablet  11  . zolpidem (AMBIEN) 10  MG tablet Take 10 mg by mouth as needed.       Marland Kitchen atorvastatin (LIPITOR) 20 MG tablet Take 1 tablet (20 mg total) by mouth daily.  30 tablet  11  . Cholecalciferol (VITAMIN D) 2000 UNITS tablet Take 2,000 Units by mouth daily.       No current facility-administered medications for this visit.     Past Medical History  Diagnosis Date  . Hypertension   . Obesity   . Prostate cancer   . GERD (gastroesophageal reflux disease)   . Chronic combined systolic and diastolic heart failure 08/16/5091    a.  Echo (06/25/13):  EF 20-25%, diff HK, restrictive physio, mild MR, mod to severe LAE, mild RVE, mildly reduced RVSF, mod to severe RAE  . NICM (nonischemic cardiomyopathy) with EF 20-25% 06/26/2013  . Atrial Fibrillation  06/25/2013  . Dyslipidemia 07/15/2013  . CAD (coronary artery disease), native coronary artery 06/28/2013    a. LHC (06/27/13):  Mid LAD 40%, mid D1 75%, mid CFX 50-60%, mid RCA 50%, EF 20%, LVEDP 24 mmHg.    Past Surgical History  Procedure Laterality Date  . Medial partial knee replacement Left 1990's?  . Transurethral resection of prostate  2010  . Tee without cardioversion N/A 06/30/2013    Procedure: TRANSESOPHAGEAL ECHOCARDIOGRAM (TEE) ;  Surgeon:  [name], MD;  Location: [facility];  Service: Cardiovascular;  Laterality: N/A;  . Cardioversion N/A 06/30/2013    Procedure: CARDIOVERSION;  Surgeon: [name], MD;  Location: [facility];  Service: Cardiovascular;  Laterality: N/A;    History   Social History  . Marital Status: Married    Spouse Name: N/A    Number of Children: N/A  . Years of Education: N/A   Occupational History  . Not on file.   Social History Main Topics  . Smoking status: Never Smoker   . Smokeless tobacco: Never Used     Comment: 06/25/2013 "smoked very light; years and years ago"  . Alcohol Use: 8.4 oz/week    7 Glasses of wine, 7 Shots of liquor per week  . Drug Use: No  . Sexual Activity: Not Currently   Other Topics Concern    . Not on file   Social History Narrative   He lives in [place]. He is widowed. Wife passed of breast cancer last year. No children.     ROS: no fevers or chills, productive cough, hemoptysis, dysphasia, odynophagia, melena, hematochezia, dysuria, hematuria, rash, seizure activity, orthopnea, PND, pedal edema, claudication. Remaining systems are negative.  Physical Exam: Well-developed well-nourished in no acute distress.  Skin is warm and dry.  HEENT is normal.  Neck is supple.  Chest is clear to auscultation with normal expansion.  Cardiovascular exam is Tachycardic and irregular. Abdominal exam nontender or distended. No masses palpated. Extremities show no edema. neuro grossly intact  ECG Atrial flutter at a rate of 128. Cannot rule out prior septal infarct. Right axis deviation.

## 2013-07-31 NOTE — Transfer of Care (Signed)
Immediate Anesthesia Transfer of Care Note  Patient: Jose Snyder  Procedure(s) Performed: Procedure(s): CARDIOVERSION (N/A)  Patient Location: Short Stay  Anesthesia Type:General  Level of Consciousness: awake, alert  and oriented  Airway & Oxygen Therapy: Patient Spontanous Breathing and Patient connected to nasal cannula oxygen  Post-op Assessment: Report given to PACU RN, Post -op Vital signs reviewed and stable and Patient moving all extremities X 4  Post vital signs: Reviewed and stable  Complications: No apparent anesthesia complications

## 2013-07-31 NOTE — Discharge Instructions (Signed)
Electrical Cardioversion °Electrical cardioversion is the delivery of a jolt of electricity to change the rhythm of the heart. Sticky patches or metal paddles are placed on the chest to deliver the electricity from a device. This is done to restore a normal rhythm. A rhythm that is too fast or not regular keeps the heart from pumping well. °Electrical cardioversion is done in an emergency if:  °· There is low or no blood pressure as a result of the heart rhythm.   °· Normal rhythm must be restored as fast as possible to protect the brain and heart from further damage.   °· It may save a life. °Cardioversion may be done for heart rhythms that are not immediately life-threatening, such as atrial fibrillation or flutter, in which:  °· The heart is beating too fast or is not regular.   °· Medicine to change the rhythm has not worked.   °· It is safe to wait in order to allow time for preparation. °· Symptoms of the abnormal rhythm are bothersome. °· The risk of stroke and other serious complications can be reduced. °LET YOUR CAREGIVER KNOW ABOUT:  °· All medicines you are taking, including vitamins, herbs, eye drops, creams, and over-the-counter medicines.   °· Previous problems you or members of your family have had with the use of anesthetics.   °· Any blood disorders you have.   °· Previous surgeries you have had.   °· Medical conditions you have. °RISKS AND COMPLICATIONS  °Generally, this is a safe procedure. However, as with any procedure, complications can occur. Possible complications include:  °· Breathing problems related to the anesthetic used. °· Cardiac arrest This risk is rare. °· A blood clot that breaks free and travels to other parts of your body. This could cause a stroke or other problems. The risk of this is lowered by use of blood thinning medicine (anticoagulant) prior to the procedure. °BEFORE THE PROCEDURE  °· You may have tests to detect blood clots in your heart and evaluate heart  function.  °· You may start taking anticoagulants so your blood does not clot as easily.   °· Medicines may be given to help stabilize your heart rate and rhythm. °PROCEDURE °· You will be given medicine through an IV tube to reduce discomfort and make you sleepy (sedative).   °· An electrical shock will be delivered. °AFTER THE PROCEDURE °Your heart rhythm will be watched to make sure it does not change. You may be able to go home within a few hours.  °Document Released: 02/03/2002 Document Revised: 12/04/2012 Document Reviewed: 08/28/2012 °Exit[employer]® Patient Information ©2014 [employer]. ° °

## 2013-07-31 NOTE — Interval H&P Note (Signed)
History and Physical Interval Note:  07/31/2013 10:41 AM  Jose Snyder  has presented today for surgery, with the diagnosis of AFIB  The various methods of treatment have been discussed with the patient and family. After consideration of risks, benefits and other options for treatment, the patient has consented to  Procedure(s): CARDIOVERSION (N/A) as a surgical intervention .  The patient's history has been reviewed, patient examined, no change in status, stable for surgery.  I have reviewed the patient's chart and labs.  Questions were answered to the patient's satisfaction.     Wonda Cheng Anthonia Monger

## 2013-07-31 NOTE — CV Procedure (Signed)
    Cardioversion Note  Marte Celani [id number] 1940/12/23  Procedure: DC Cardioversion Indications: atrial fib  Procedure Details Consent: Obtained Time Out: Verified patient identification, verified procedure, site/side was marked, verified correct patient position, special equipment/implants available, Radiology Safety Procedures followed,  medications/allergies/relevent history reviewed, required imaging and test results available.  Performed  The patient has been on adequate anticoagulation.  The patient received IV Propofol 90mg   for sedation.  Synchronous cardioversion was performed at 120 joules.  The cardioversion was successful.    Complications: No apparent complications Patient did tolerate procedure well.   Thayer Headings, Brooke Bonito., MD, [facility] 07/31/2013, 10:48 AM

## 2013-07-31 NOTE — Anesthesia Preprocedure Evaluation (Addendum)
Anesthesia Evaluation  Patient identified by MRN, date of birth, ID band Patient awake    Reviewed: Allergy & Precautions, H&P , NPO status , Patient's Chart, lab work & pertinent test results  Airway Mallampati: II TM Distance: >3 FB Neck ROM: full    Dental  (+) Edentulous Upper   Pulmonary shortness of breath,          Cardiovascular hypertension, + CAD and +CHF + dysrhythmias Atrial Fibrillation     Neuro/Psych    GI/Hepatic GERD-  ,  Endo/Other  obese  Renal/GU Renal InsufficiencyRenal disease     Musculoskeletal   Abdominal   Peds  Hematology   Anesthesia Other Findings   Reproductive/Obstetrics                         Anesthesia Physical Anesthesia Plan  ASA: III  Anesthesia Plan: MAC   Post-op Pain Management:    Induction: Intravenous  Airway Management Planned: Mask  Additional Equipment:   Intra-op Plan:   Post-operative Plan:   Informed Consent: I have reviewed the patients History and Physical, chart, labs and discussed the procedure including the risks, benefits and alternatives for the proposed anesthesia with the patient or authorized representative who has indicated his/her understanding and acceptance.     Plan Discussed with: CRNA, Anesthesiologist and Surgeon  Anesthesia Plan Comments:         Anesthesia Quick Evaluation

## 2013-08-01 ENCOUNTER — Encounter (HOSPITAL_COMMUNITY): Payer: Self-pay | Admitting: Cardiovascular Disease

## 2013-08-13 ENCOUNTER — Encounter: Payer: Self-pay | Admitting: Cardiology

## 2013-08-13 ENCOUNTER — Ambulatory Visit (INDEPENDENT_AMBULATORY_CARE_PROVIDER_SITE_OTHER): Payer: Medicare Other | Admitting: Cardiology

## 2013-08-13 ENCOUNTER — Other Ambulatory Visit: Payer: Self-pay | Admitting: *Deleted

## 2013-08-13 VITALS — BP 130/70 | HR 72 | Ht 74.0 in | Wt 227.1 lb

## 2013-08-13 DIAGNOSIS — I4891 Unspecified atrial fibrillation: Secondary | ICD-10-CM | POA: Diagnosis not present

## 2013-08-13 DIAGNOSIS — I2510 Atherosclerotic heart disease of native coronary artery without angina pectoris: Secondary | ICD-10-CM

## 2013-08-13 DIAGNOSIS — E7800 Pure hypercholesterolemia, unspecified: Secondary | ICD-10-CM | POA: Diagnosis not present

## 2013-08-13 DIAGNOSIS — E785 Hyperlipidemia, unspecified: Secondary | ICD-10-CM

## 2013-08-13 MED ORDER — METOPROLOL SUCCINATE ER 25 MG PO TB24: 25.0000 mg | ORAL_TABLET | Freq: Every day | ORAL | Status: DC

## 2013-08-13 NOTE — Progress Notes (Signed)
HPI: FU atrial fibrillation. He was admitted 4/15 with AFib with RVR. CEs were negative. Inpatient Myoview was abnormal with low EF and apical scar. Echo confirmed EF 20-25%. LHC demonstrated mod non-obs CAD. Most significant lesion was a mid D1 with 75% stenosis. DCM was felt to be out of proportion to CAD (NICM). He was diuresed with IV Lasix. He was placed on amiodarone, metoprolol, Apixaban. He underwent TEE-DCCV on 06/30/13. This was unsuccessful. Therefore, it was felt that he should remain on Amiodarone for 3 weeks before re-attempt at [place]. Subsequently had successful DCCV 07/31/13.  Studies:  - LHC (06/27/13): Mid LAD 40%, mid D1 75%, mid CFX 50-60%, mid RCA 50%, EF 20%, LVEDP 24 mmHg.  - Echo (06/25/13): EF 20-25%, diff HK, restrictive physio, mild MR, mod to severe LAE, mild RVE, mildly reduced RVSF, mod to severe RAE.  - Nuclear (06/26/13): Apical scar, no ischemia, EF 31%, apical HK  Since his DCCV, He feels much better. His dyspnea on exertion has resolved. His energy has improved. No chest pain, orthopnea, syncope. He continues to have mild nausea. History murmur has resolved.  Current Outpatient Prescriptions  Medication Sig Dispense Refill  . amiodarone (PACERONE) 200 MG tablet Take 1 tablet (200 mg total) by mouth daily.      Marland Kitchen apixaban (ELIQUIS) 5 MG TABS tablet Take 1 tablet (5 mg total) by mouth 2 (two) times daily.  60 tablet  11  . atorvastatin (LIPITOR) 20 MG tablet Take 1 tablet (20 mg total) by mouth daily.  30 tablet  11  . Cholecalciferol (VITAMIN D) 2000 UNITS tablet Take 2,000 Units by mouth daily.      . furosemide (LASIX) 40 MG tablet Take 1 tablet (40 mg total) by mouth daily.  30 tablet  11  . metoprolol tartrate (LOPRESSOR) 25 MG tablet Take 1 tablet (25 mg total) by mouth 2 (two) times daily.  60 tablet  11  . omeprazole (PRILOSEC) 20 MG capsule Take 20 mg by mouth daily.      . potassium chloride SA (K-DUR,KLOR-CON) 20 MEQ tablet Take 1 tablet (20 mEq total) by  mouth daily.  30 tablet  11  . zolpidem (AMBIEN) 10 MG tablet Take 10 mg by mouth as needed for sleep.        No current facility-administered medications for this visit.     Past Medical History  Diagnosis Date  . Hypertension   . Obesity   . Prostate cancer   . GERD (gastroesophageal reflux disease)   . Chronic combined systolic and diastolic heart failure 6/64/4034    a.  Echo (06/25/13):  EF 20-25%, diff HK, restrictive physio, mild MR, mod to severe LAE, mild RVE, mildly reduced RVSF, mod to severe RAE  . NICM (nonischemic cardiomyopathy) with EF 20-25% 06/26/2013  . Atrial Fibrillation  06/25/2013  . Dyslipidemia 07/15/2013  . CAD (coronary artery disease), native coronary artery 06/28/2013    a. LHC (06/27/13):  Mid LAD 40%, mid D1 75%, mid CFX 50-60%, mid RCA 50%, EF 20%, LVEDP 24 mmHg.    Past Surgical History  Procedure Laterality Date  . Medial partial knee replacement Left 1990's?  . Transurethral resection of prostate  2010  . Tee without cardioversion N/A 06/30/2013    Procedure: TRANSESOPHAGEAL ECHOCARDIOGRAM (TEE) ;  Surgeon: [name], MD;  Location: [facility];  Service: Cardiovascular;  Laterality: N/A;  . Cardioversion N/A 06/30/2013    Procedure: CARDIOVERSION;  Surgeon: [name], MD;  Location: [facility];  Service: Cardiovascular;  Laterality: N/A;  . Cardioversion N/A 07/31/2013    Procedure: CARDIOVERSION;  Surgeon: Thayer Headings, MD;  Location: [facility];  Service: Cardiovascular;  Laterality: N/A;    History   Social History  . Marital Status: Married    Spouse Name: N/A    Number of Children: N/A  . Years of Education: N/A   Occupational History  . Not on file.   Social History Main Topics  . Smoking status: Never Smoker   . Smokeless tobacco: Never Used     Comment: 06/25/2013 "smoked very light; years and years ago"  . Alcohol Use: 8.4 oz/week    7 Glasses of wine, 7 Shots of liquor per week  . Drug Use: No  . Sexual Activity: Not  Currently   Other Topics Concern  . Not on file   Social History Narrative   He lives in [place]. He is widowed. Wife passed of breast cancer last year. No children.     ROS: no fevers or chills, productive cough, hemoptysis, dysphasia, odynophagia, melena, hematochezia, dysuria, hematuria, rash, seizure activity, orthopnea, PND, pedal edema, claudication. Remaining systems are negative.  Physical Exam: Well-developed well-nourished in no acute distress.  Skin is warm and dry.  HEENT is normal.  Neck is supple.  Chest is clear to auscultation with normal expansion.  Cardiovascular exam is regular rate and rhythm.  Abdominal exam nontender or distended. No masses palpated. Extremities show no edema. neuro grossly intact  ECGSinus rhythm at a rate of 72. First-degree AV block. Cannot rule out prior septal infarct. Prolonged QT interval. Right axis deviation.

## 2013-08-13 NOTE — Assessment & Plan Note (Signed)
Begin Lipitor 20 mg daily. Check lipids and liver in 6 weeks.

## 2013-08-13 NOTE — Assessment & Plan Note (Signed)
Blood pressure controlled. Note he is having some fatigue. I will discontinue metoprolol and begin Toprol 25 mg each bedtime to see if this will improve his fatigue during the day.

## 2013-08-13 NOTE — Assessment & Plan Note (Signed)
Patient remains in sinus rhythm. Continue amiodarone for now. He is having some nausea and I wonder whether this may be related to amiodarone. If it persists we will consider discontinuing realizing his risk of recurrent atrial fibrillation will be increased. Continue anticoagulation. In 4 weeks we will check TSH and liver functions.

## 2013-08-13 NOTE — Patient Instructions (Signed)
Your physician recommends that you schedule a follow-up appointment in: 3 MONTHS WITH DR CRENSHAW  STOP METOPROLOL TARTRATE  START METOPROLOL SUCC ER 25 MG ONCE DAILY AT BEDTIME  Your physician has requested that you have an echocardiogram. Echocardiography is a painless test that uses sound waves to create images of your heart. It provides your doctor with information about the size and shape of your heart and how well your heart's chambers and valves are working. This procedure takes approximately one hour. There are no restrictions for this procedure.IN [place] physician recommends that you return for lab work in: [place]  LIPITOR= DO NOT EAT PRIOR TO LAB WORK

## 2013-08-13 NOTE — Assessment & Plan Note (Signed)
Add statin. No aspirin given need for anticoagulation.

## 2013-08-13 NOTE — Assessment & Plan Note (Signed)
Euvolemic on examination.Continue present dose of Lasix. 

## 2013-08-13 NOTE — Assessment & Plan Note (Signed)
This is felt most likely tachycardia mediated. Plan to continue beta blocker. I have not added an ACE inhibitor because of his renal insufficiency and the fact that he feels he is having multiple side effects from medications. I will plan to repeat his echocardiogram in 8 weeks. Hopefully his LV function will have normalized following restoration of sinus rhythm. If not we will add an ACE inhibitor at that point.

## 2013-09-08 ENCOUNTER — Telehealth: Payer: Self-pay | Admitting: Cardiology

## 2013-09-08 NOTE — Telephone Encounter (Signed)
States his heart is out of rhythm for @ a week now.  Heart rates running low 90's to 104.  He increased his Lasix to 40mg  bid on Thursday due to mild wheezing, coughing arms & abdominal swelling - no complaints of SOB.  States he has felt better since increasing the Lasix.  Appointment scheduled with Cecilie Kicks, NP 7/27 for follow-up.  Message sent to Dr. Stanford Breed for advise on meds.

## 2013-09-08 NOTE — Telephone Encounter (Signed)
Ablation may not be possible given severe biatrial enlargement; discuss alternative therapies with Dr Rayann Heman. Jose Snyder

## 2013-09-08 NOTE — Telephone Encounter (Signed)
New Message  Pt called states that his heart has gone out of rythem again. Requests a call back from the nurse.

## 2013-09-08 NOTE — Telephone Encounter (Signed)
Will most likely need to see [name] for consideration of ablation Jose Snyder

## 2013-09-08 NOTE — Telephone Encounter (Signed)
Per Dr. Stanford Breed set up with Dr. Rayann Heman for possible Ablation.

## 2013-09-22 ENCOUNTER — Ambulatory Visit (INDEPENDENT_AMBULATORY_CARE_PROVIDER_SITE_OTHER): Payer: Medicare Other | Admitting: Internal Medicine

## 2013-09-22 ENCOUNTER — Ambulatory Visit: Payer: Medicare Other | Admitting: Cardiology

## 2013-09-22 ENCOUNTER — Encounter: Payer: Self-pay | Admitting: Internal Medicine

## 2013-09-22 VITALS — BP 126/82 | HR 108 | Ht 74.0 in | Wt 240.0 lb

## 2013-09-22 DIAGNOSIS — I4891 Unspecified atrial fibrillation: Secondary | ICD-10-CM

## 2013-09-22 DIAGNOSIS — I2510 Atherosclerotic heart disease of native coronary artery without angina pectoris: Secondary | ICD-10-CM

## 2013-09-22 DIAGNOSIS — E7800 Pure hypercholesterolemia, unspecified: Secondary | ICD-10-CM

## 2013-09-22 DIAGNOSIS — I428 Other cardiomyopathies: Secondary | ICD-10-CM

## 2013-09-22 DIAGNOSIS — I5042 Chronic combined systolic (congestive) and diastolic (congestive) heart failure: Secondary | ICD-10-CM | POA: Diagnosis not present

## 2013-09-22 DIAGNOSIS — I10 Essential (primary) hypertension: Secondary | ICD-10-CM

## 2013-09-22 MED ORDER — METOPROLOL SUCCINATE ER 50 MG PO TB24: 50.0000 mg | ORAL_TABLET | Freq: Every day | ORAL | Status: DC

## 2013-09-22 NOTE — Progress Notes (Signed)
Primary Care Physician: Verline Lema, MD Primary Cardiologist: Dr. Kirk Ruths Primary Electrophysiologist: Dr. Thompson Grayer Referring Physician: Dr. Vern Claude Pavey is a 73 y.o. male with a h/o persistent atrial fibrillation who presents for consultation in the [facility].  The patient was initially diagnosed with atrial fibrillation 4/15 after presenting with symptoms of dyspnea and found to be in new onset afib with RVR associated with  heart failure symptoms with an EF of 20-25% . Pt was diuresed with improvement of symptoms and underwent LHC with mod obstructive disease not requiring intervention, felt to be out of proportion to his cardiomyopathy. First attempt at cardioversion was unsuccessful. He was then loaded on amiodarone and successfully underwent DCCV 3 weeks later, but maintined SR for only 2-3 weeks.. He has had nausea with amiodarone, improved with dose reduction, but has new hand tremors. He believes he may be going in and out of AFIB as he has noticed some of his dyspnea has returned. He is pending a sleep study. He has lost 20 lbs recently.  Today, he denies symptoms of palpitations, chest pain, shortness of breath, orthopnea, PND, lower extremity edema, dizziness, presyncope, syncope, snoring, daytime somnolence, bleeding, or neurologic sequela. The patient is tolerating medications without difficulties and is otherwise without complaint today.    Atrial Fibrillation Risk Factors:  he does have symptoms or diagnosis of sleep apnea. he  Is pending sleep study  he does not have a history of rheumatic fever.  he does have a history of alcohol use.  he has a BMI of Body mass index is 30.8 kg/(m^2).Marland Kitchen  LA size: 48   Atrial Fibrillation Management history:  Previous antiarrhythmic drugs: amiodarone  Previous cardioversions: 4/15 and 6/4  Previous ablations: none  CHADS2VASC score: at least 4.  Anticoagulation  history: on apixaban.   Past Medical History  Diagnosis Date  . Hypertension   . Obesity   . Prostate cancer   . GERD (gastroesophageal reflux disease)   . Chronic combined systolic and diastolic heart failure 5/62/1308    a.  Echo (06/25/13):  EF 20-25%, diff HK, restrictive physio, mild MR, mod to severe LAE, mild RVE, mildly reduced RVSF, mod to severe RAE  . NICM (nonischemic cardiomyopathy) with EF 20-25% 06/26/2013  . Atrial Fibrillation  06/25/2013  . Dyslipidemia 07/15/2013  . CAD (coronary artery disease), native coronary artery 06/28/2013    a. LHC (06/27/13):  Mid LAD 40%, mid D1 75%, mid CFX 50-60%, mid RCA 50%, EF 20%, LVEDP 24 mmHg.   Past Surgical History  Procedure Laterality Date  . Medial partial knee replacement Left 1990's?  . Transurethral resection of prostate  2010  . Tee without cardioversion N/A 06/30/2013    Procedure: TRANSESOPHAGEAL ECHOCARDIOGRAM (TEE) ;  Surgeon: [name], MD;  Location: [facility];  Service: Cardiovascular;  Laterality: N/A;  . Cardioversion N/A 06/30/2013    Procedure: CARDIOVERSION;  Surgeon: [name], MD;  Location: [place] [facility];  Service: Cardiovascular;  Laterality: N/A;  . Cardioversion N/A 07/31/2013    Procedure: CARDIOVERSION;  Surgeon: Thayer Headings, MD;  Location[facility] ENDOSCOPY;  Service: Cardiovascular;  Laterality: N/A;    Current Outpatient Prescriptions  Medication Sig Dispense Refill  . amiodarone (PACERONE) 200 MG tablet Take 1 tablet (200 mg total) by mouth daily.      Marland Kitchen apixaban (ELIQUIS) 5 MG TABS tablet Take 1 tablet (5 mg total) by mouth 2 (two) times daily.  60 tablet  11  . atorvastatin (LIPITOR) 20 MG tablet Take 1 tablet (20 mg total) by mouth daily.  30 tablet  11  . Cholecalciferol (VITAMIN D) 2000 UNITS tablet Take 2,000 Units by mouth daily.      . furosemide (LASIX) 40 MG tablet Take 1 tablet (40 mg total) by mouth daily.  30 tablet  11  . metoprolol succinate (TOPROL XL) 25 MG 24 hr tablet Take 1  tablet (25 mg total) by mouth daily.  30 tablet  12  . omeprazole (PRILOSEC) 20 MG capsule Take 20 mg by mouth daily.      . potassium chloride SA (K-DUR,KLOR-CON) 20 MEQ tablet Take 1 tablet (20 mEq total) by mouth daily.  30 tablet  11  . zolpidem (AMBIEN) 10 MG tablet Take 10 mg by mouth as needed for sleep.        No current facility-administered medications for this visit.    No Known Allergies  History   Social History  . Marital Status: Married    Spouse Name: N/A    Number of Children: N/A  . Years of Education: N/A   Occupational History  . Not on file.   Social History Main Topics  . Smoking status: Never Smoker   . Smokeless tobacco: Never Used     Comment: 06/25/2013 "smoked very light; years and years ago"  . Alcohol Use: 8.4 oz/week    7 Glasses of wine, 7 Shots of liquor per week  . Drug Use: No  . Sexual Activity: Not Currently   Other Topics Concern  . Not on file   Social History Narrative   He lives in [place]. He is widowed. Wife passed of breast cancer last year. No children.     No family history on file. The patient does not  have a history of early familial atrial fibrillation or other arrhythmias.  ROS- All systems are reviewed and negative except as per the HPI above.  Physical Exam: BP 126/82, HR 108   GEN- The patient is well appearing, alert and oriented x 3 today.   Head- normocephalic, atraumatic Eyes-  Sclera clear, conjunctiva pink Ears- hearing intact Oropharynx- clear Neck- supple, no JVP Lymph- no cervical lymphadenopathy Lungs- Clear to ausculation bilaterally, normal work of breathing Heart- Rapid, irregular rate and rhythm, no murmurs, rubs or gallops, PMI not laterally displaced GI- soft, NT, ND, + BS Extremities- no clubbing, cyanosis, or edema MS- no significant deformity or atrophy Skin- no rash or lesion Psych- euthymic mood, full affect Neuro- strength and sensation are intact  EKG Coarse AFIB at 108 bpm.  Right axis deviation, cannot r/o old septal MI. Echo  EF 20-25% with diffuse hypokinesis. Left atrium at 48, moderately to severely enlarged. Rt atrium also mod to sev dilated with rt ventricle mildly dilated. Epic records are reviewed at length today  Assessment and Plan:  1. Atrial fibrillation The patient has symptomatic  persistent atrial fibrillation.  The patients CHAD2VASC score is  At least 4.  he is  appropriately anticoagulated at this time. The patient is not adequately rate controlled with Metoprolol. Antiarrhythmic therapy to date includes amiodarone not maintaining SR.  The patients left atrial size is 49 +mm.  Additional echo findings include EF 20-25%. A long discussion with the patient was had today regarding therapeutic strategies.  Extensive discussion of lifestyle modification including weight loss, continuing  with plans for sleep study, and limiting alcohol, treating BP and cholesterol to goal.  Presently, our recommendations include :  1.Therapeutic strategies for afib including medicine and ablation were discussed in detail with the patient today. Risk, benefits, and alternatives to EP study and radiofrequency ablation for afib were also discussed in detail today.  Patient does not appear a likely candidate for PVI, due to atrial size and  failure to maintain NSR,  despite   cardioversion x2 and antiarrhythmics. He may be a better candidate for maze procedure. He will be scheduled to see Dr. Roxy Manns to further discuss this procedure.  2. Mobesity As above, lifestyle modification was discussed at length including regular exercise and weight reduction.  3. Obstructive sleep apnea The importance of adequate treatment of sleep apnea was discussed today in order to improve our ability to maintain sinus rhythm long term. Encouraged to follow through with scheduled study.  4. Continue with amiodarone for now, but increase metoprolol succinate to 50 mg a day for better rate  control.  5. Follow up in AF clinic in 4 weeks.     Roderic Palau NP  Nurse Practitioner, [facility] 09/22/2013 8:37 AM  I have seen, examined the patient, and reviewed the above assessment and plan with Roderic Palau NP.  Changes to above are made where necessary.   Will refer to Dr Roxy Manns for consideration of a surgical maze procedure.  Co Sign: Thompson Grayer, MD 09/23/2013 8:02 PM

## 2013-09-22 NOTE — Patient Instructions (Addendum)
You have been referred to Dr Roxy Manns   Your physician has recommended you make the following change in your medication:  1) Increase Metoprolol to 50mg  daily   Your physician recommends that you schedule a follow-up appointment in: 4 weeks with Roderic Palau, NP/ Dr Rayann Heman

## 2013-09-24 ENCOUNTER — Institutional Professional Consult (permissible substitution) (INDEPENDENT_AMBULATORY_CARE_PROVIDER_SITE_OTHER): Payer: Medicare Other | Admitting: Thoracic Surgery (Cardiothoracic Vascular Surgery)

## 2013-09-24 ENCOUNTER — Encounter: Payer: Self-pay | Admitting: Thoracic Surgery (Cardiothoracic Vascular Surgery)

## 2013-09-24 ENCOUNTER — Other Ambulatory Visit: Payer: Self-pay | Admitting: *Deleted

## 2013-09-24 VITALS — BP 124/75 | HR 60 | Resp 20 | Ht 74.0 in | Wt 233.0 lb

## 2013-09-24 DIAGNOSIS — I2510 Atherosclerotic heart disease of native coronary artery without angina pectoris: Secondary | ICD-10-CM | POA: Diagnosis not present

## 2013-09-24 DIAGNOSIS — I4891 Unspecified atrial fibrillation: Secondary | ICD-10-CM

## 2013-09-24 DIAGNOSIS — I482 Chronic atrial fibrillation, unspecified: Secondary | ICD-10-CM | POA: Insufficient documentation

## 2013-09-24 NOTE — Progress Notes (Signed)
[address]       [place] [address]             [facility] REPORT  Referring Provider is Thompson Grayer, MD Primary Cardiologist is Stanford Breed Denice Bors, MD PCP is Verline Lema, MD  Chief Complaint  Patient presents with  . Atrial Fibrillation    Surgical eval for possible MAZE procedure    HPI:  Patient is a 73 year old retired white male from [employer] with history of hypertension, nonischemic cardiomyopathy, chronic combined systolic and diastolic congestive heart failure, obesity who is been referred for possible surgical treatment of recurrent persistent atrial fibrillation. The patient states that he first began to experience symptoms of exertional shortness of breath some time last year. He reportedly had some pulmonary function tests performed at the [facility] medical clinic in [place] last fall, and subsequently symptoms were attributed to seasonal allergies. Beginning this spring the patient began to experience significant progression of symptoms of exertional shortness of breath. Ultimately he was hospitalized in April when he was diagnosed with rapid atrial fibrillation and acute exacerbation of chronic combined systolic and diastolic congestive heart failure.  Transthoracic echocardiogram performed at that time demonstrated ejection fraction 20-25%. Diagnostic cardiac catheterization was performed notable for moderate nonobstructive coronary artery disease. The patient's nonischemic cardiomyopathy was felt most likely to be related to tachycardia. He underwent DC cardioversion during that admission but promptly returned into atrial fibrillation. He was loaded with amiodarone and anticoagulated using Eliquis.  Since hospital discharge she has been seen in followup on several occasions by Dr. Stanford Breed, and he underwent repeat DC cardioversion 07/31/2013.  He initially did well and remained in sinus rhythm when he was  seen in followup on June 17. However, the patient subsequently has developed recurrent persistent atrial fibrillation. He was referred to Dr. Rayann Heman to consider EP studay and radiofrequency Afib ablation, Please felt to be relatively poor candidate because of enlarged left atrial size and other comorbid medical problems. The patient has been referred for surgical consultation to consider elective Maze procedure.  The patient is a widower and lives alone in [place]. 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 He has remained physically active all of his life. He enjoys doing yard work and has several other hobbies. He complains of recent progression of symptoms of dyspnea with exertion and progressive fatigue.  He states that this really began to get worse in early April. He reports that he does feel better currently than he did at that time he was hospitalized in April, but he still complains of shortness of breath with moderately strenuous exertion. This does not limit his ordinary day-to-day activities around the house, but it limits his lifestyle considerably. He has some palpitations. He has not had any chest discomfort either with activity or at rest. He denies any history of resting shortness of breath, PND, orthopnea, or lower extremity edema.   Past Medical History  Diagnosis Date  . Hypertension   . Obesity   . Prostate cancer   . GERD (gastroesophageal reflux disease)   . Chronic combined systolic and diastolic heart failure 9/92/4268    a.  Echo (06/25/13):  EF 20-25%, diff HK, restrictive physio, mild MR, mod to severe LAE, mild RVE, mildly reduced RVSF, mod to severe RAE  . NICM (nonischemic cardiomyopathy) with EF 20-25% 06/26/2013  .  Atrial Fibrillation  06/25/2013  . Dyslipidemia 07/15/2013  . CAD (coronary artery disease), native coronary artery 06/28/2013    a. LHC (06/27/13):  Mid LAD 40%, mid D1  75%, mid CFX 50-60%, mid RCA 50%, EF 20%, LVEDP 24 mmHg.    Past Surgical History  Procedure Laterality Date  . Medial partial knee replacement Left 1990's?  . Transurethral resection of prostate  2010  . Tee without cardioversion N/A 06/30/2013    Procedure: TRANSESOPHAGEAL ECHOCARDIOGRAM (TEE) ;  Surgeon: [name], MD;  Location: [facility];  Service: Cardiovascular;  Laterality: N/A;  . Cardioversion N/A 06/30/2013    Procedure: CARDIOVERSION;  Surgeon: [name], MD;  Location: [facility];  Service: Cardiovascular;  Laterality: N/A;  . Cardioversion N/A 07/31/2013    Procedure: CARDIOVERSION;  Surgeon: Thayer Headings, MD;  Location: [facility];  Service: Cardiovascular;  Laterality: N/A;    History reviewed. No pertinent family history.  History   Social History  . Marital Status: Married    Spouse Name: N/A    Number of Children: N/A  . Years of Education: N/A   Occupational History  . Not on file.   Social History Main Topics  . Smoking status: Never Smoker   . Smokeless tobacco: Never Used     Comment: 06/25/2013 "smoked very light; years and years ago"  . Alcohol Use: 8.4 oz/week    7 Glasses of wine, 7 Shots of liquor per week  . Drug Use: No  . Sexual Activity: Not Currently   Other Topics Concern  . Not on file   Social History Narrative   He lives in [place]. He is widowed. Wife passed of breast cancer last year. No children.     Current Outpatient Prescriptions  Medication Sig Dispense Refill  . amiodarone (PACERONE) 200 MG tablet Take 1 tablet (200 mg total) by mouth daily.      Marland Kitchen apixaban (ELIQUIS) 5 MG TABS tablet Take 1 tablet (5 mg total) by mouth 2 (two) times daily.  60 tablet  11  . atorvastatin (LIPITOR) 20 MG tablet Take 1 tablet (20 mg total) by mouth daily.  30 tablet  11  . Cholecalciferol (VITAMIN D) 2000 UNITS tablet Take 2,000 Units by mouth daily.      . furosemide (LASIX) 40 MG tablet Take 1 tablet (40 mg total) by mouth  daily.  30 tablet  11  . lisinopril (PRINIVIL,ZESTRIL) 10 MG tablet Take 5 mg by mouth daily.      . metoprolol succinate (TOPROL XL) 50 MG 24 hr tablet Take 1 tablet (50 mg total) by mouth daily.  30 tablet  12  . omeprazole (PRILOSEC) 20 MG capsule Take 20 mg by mouth daily.      . potassium chloride SA (K-DUR,KLOR-CON) 20 MEQ tablet Take 1 tablet (20 mEq total) by mouth daily.  30 tablet  11  . temazepam (RESTORIL) 15 MG capsule Take 15 mg by mouth at bedtime.       No current facility-administered medications for this visit.    No Known Allergies    Review of Systems:   General:  normal appetite, decreased energy, no weight gain, no weight loss, no fever  Cardiac:  no chest pain with exertion, no chest pain at rest, + SOB with moderate exertion, no resting SOB, no PND, no orthopnea, + palpitations, + arrhythmia, + atrial fibrillation, no LE edema, no dizzy spells, no syncope  Respiratory:  + exertional shortness of breath, no  home oxygen, no productive cough, no dry cough, no bronchitis, no wheezing, no hemoptysis, no asthma, no pain with inspiration or cough, no sleep apnea, no CPAP at night  GI:   no difficulty swallowing, no reflux, no frequent heartburn, no hiatal hernia, no abdominal pain, no constipation, no diarrhea, no hematochezia, no hematemesis, no melena  GU:   no dysuria,  no frequency, no urinary tract infection, no hematuria, no enlarged prostate, no kidney stones, no kidney disease  Vascular:  no pain suggestive of claudication, no pain in feet, no leg cramps, no varicose veins, no DVT, no non-healing foot ulcer  Neuro:   no stroke, no TIA's, no seizures, no headaches, no temporary blindness one eye,  no slurred speech, no peripheral neuropathy, no chronic pain, no instability of gait, no memory/cognitive dysfunction  Musculoskeletal: Very mild arthritis in right hand, no joint swelling, no myalgias, no difficulty walking, normal mobility   Skin:   no rash, no itching, no  skin infections, no pressure sores or ulcerations  Psych:   no anxiety, no depression, no nervousness, no unusual recent stress  Eyes:   no blurry vision, no floaters, no recent vision changes, does not wear glasses or contacts  ENT:   no hearing loss, no loose or painful teeth, + upper dentures, last saw dentist several years ago  Hematologic:   easy bruising, no abnormal bleeding, no clotting disorder, no frequent epistaxis  Endocrine:  no diabetes, does not check CBG's at home     Physical Exam:   BP 124/75  Pulse 60  Resp 20  Ht 6\' 2"  (1.88 m)  Wt 105.688 kg (233 lb)  BMI 29.90 kg/m2  SpO2 98%  General:  Moderately obese but o/w well-appearing  HEENT:  Unremarkable   Neck:   no JVD, no bruits, no adenopathy   Chest:   clear to auscultation, symmetrical breath sounds, no wheezes, no rhonchi   CV:   Irregular rate and rhythm, no murmur   Abdomen:  soft, non-tender, no masses   Extremities:  warm, well-perfused, pulses palpable, no LE edema  Rectal/GU  Deferred  Neuro:   Grossly non-focal and symmetrical throughout  Skin:   Clean and dry, no rashes, no breakdown   Diagnostic Tests:  Transthoracic Echocardiography  Patient: Jose Snyder, Jose Snyder MR #: [id number] Study Date: 06/25/2013 Gender: M Age: 64 Height: 188cm Weight: 112.3kg BSA: 2.44m^2 Pt. Status: Room: 2W05C  SONOGRAPHER Leavy Cella Areta Haber PERFORMING [facility], Inpatient cc:  ------------------------------------------------------------ LV EF: 20% - 25%  ------------------------------------------------------------ Indications: Atrial flutter 427.32.  ------------------------------------------------------------ History: PMH: Atrial fibrillation. Atrial flutter. Angina pectoris. Risk factors: Hypertension.  ------------------------------------------------------------ Study Conclusions  - Left ventricle: The cavity size was normal. Wall thickness was normal. Systolic function was severely  reduced. The estimated ejection fraction was in the range of 20% to 25%. Diffuse hypokinesis. Doppler parameters are consistent with restrictive physiology, indicative of decreased left ventricular diastolic compliance and/or increased left atrial pressure. - Mitral valve: Mild regurgitation. - Left atrium: The atrium was moderately to severely dilated. - Right ventricle: The cavity size was mildly dilated. Systolic function was mildly reduced. - Right atrium: The atrium was moderately to severely dilated. Echocardiography. M-mode, complete 2D, spectral Doppler, and color Doppler. Height: Height: 188cm. Height: 74in. Weight: Weight: 112.3kg. Weight: 247lb. Body mass index: BMI: 31.8kg/m^2. Body surface area: BSA: 2.64m^2. Blood pressure: 126/67. Patient status: Inpatient.  ------------------------------------------------------------  ------------------------------------------------------------ Left ventricle: The cavity size was normal. Wall thickness was normal. Systolic function was severely reduced.  The estimated ejection fraction was in the range of 20% to 25%. Diffuse hypokinesis. Doppler parameters are consistent with restrictive physiology, indicative of decreased left ventricular diastolic compliance and/or increased left atrial pressure.  ------------------------------------------------------------ Aortic valve: Structurally normal valve. Cusp separation was normal. Doppler: Transvalvular velocity was within the normal range. There was no stenosis. No regurgitation.  ------------------------------------------------------------ Aorta: Aortic root: The aortic root was normal in size. Ascending aorta: The ascending aorta was mildly dilated.  ------------------------------------------------------------ Mitral valve: Structurally normal valve. Leaflet separation was normal. Doppler: Transvalvular velocity was within the normal range. There was no evidence for  stenosis. Mild regurgitation. Peak gradient: 73mm Hg (D).  ------------------------------------------------------------ Left atrium: The atrium was moderately to severely dilated.  ------------------------------------------------------------ Right ventricle: The cavity size was mildly dilated. Systolic function was mildly reduced.  ------------------------------------------------------------ Tricuspid valve: Structurally normal valve. Leaflet separation was normal. Doppler: Transvalvular velocity was within the normal range. No regurgitation.  ------------------------------------------------------------ Right atrium: The atrium was moderately to severely dilated.  ------------------------------------------------------------ Pericardium: There was no pericardial effusion.  ------------------------------------------------------------ Systemic veins: Inferior vena cava: The vessel was dilated; the respirophasic diameter changes were blunted (< 50%); findings are consistent with elevated central venous pressure.  ------------------------------------------------------------  2D measurements Normal Doppler measurements Normal Left ventricle Mitral valve LVID ED, 46.6 mm 43-52 Peak E vel 102 cm/s ------ chord, Deceleration 130 ms 150-23 PLAX time 0 LVID ES, 37.4 mm 23-38 Peak 4 mm ------ chord, gradient, D Hg PLAX FS, chord, 20 % >29 PLAX LVPW, ED 12 mm ------ IVS/LVPW 0.9 <1.3 ratio, ED Ventricular septum IVS, ED 10.8 mm ------ Aorta Root diam, 38 mm ------ ED Left atrium AP dim 48 mm ------ AP dim 1.96 cm/m^2 <2.2 index  ------------------------------------------------------------ Prepared and Electronically Authenticated by  [employer], Mihai 2015-04-29T18:02:[id number]    Transesophageal Echocardiography  Patient: Arsh, Feutz MR #: [id number] Study Date: 06/30/2013 Gender: M Age: 71 Height: 188cm Weight: 110kg BSA: 2.24m^2 Pt. Status: Room:  0J62E  Alyse Low, MD PERFORMING Fransico Him, MD REFERRING Fransico Him, MD SONOGRAPHER Florentina Jenny, RDCS ADMITTING Lauree Chandler ATTENDING Lauree Chandler cc:  ------------------------------------------------------------ LV EF: 20% - 25%  ------------------------------------------------------------ Indications: Atrial fibrillation - 427.31.  ------------------------------------------------------------ Study Conclusions  - Left ventricle: Systolic function was severely reduced. The estimated ejection fraction was in the range of 20% to 25%. Diffuse hypokinesis. - Aortic valve: No evidence of vegetation. Trivial regurgitation. - Mitral valve: No evidence of vegetation. Mild regurgitation. - Left atrium: The atrium was severely dilated. No evidence of thrombus in the atrial cavity or appendage. There was moderatecontinuous spontaneous echo contrast ("smoke") in the cavity. The appendage was well visualized, morphologically a left appendage, and of normal size. Emptying velocity was reduced. - Right ventricle: The cavity size was mildly dilated. Wall thickness was normal. - Right atrium: The atrium was severely dilated. No evidence of thrombus in the atrial cavity or appendage. There was severecontinuous spontaneous echo contrast ("smoke") in the cavity. - Atrial septum: No defect or patent foramen ovale was identified. - Tricuspid valve: No evidence of vegetation. - Pulmonic valve: No evidence of vegetation. Transesophageal echocardiography. 2D and color Doppler. Height: Height: 188cm. Height: 74in. Weight: Weight: 110kg. Weight: 242lb. Body mass index: BMI: 31.1kg/m^2. Body surface area: BSA: 2.84m^2. Blood pressure: 132/87. Patient status: Inpatient. Location: Endoscopy.  ------------------------------------------------------------  ------------------------------------------------------------ Left ventricle: Systolic function was severely  reduced. The estimated ejection fraction was in the range of 20% to 25%. Diffuse hypokinesis.  ------------------------------------------------------------ Aortic valve: Structurally normal valve. Trileaflet; normal thickness leaflets. Cusp  separation was normal. No evidence of vegetation. Doppler: Trivial regurgitation.  ------------------------------------------------------------ Aorta: The aorta was normal, not dilated, and non-diseased. There was no atheroma. There was no evidence for dissection. Aortic root: The aortic root was not dilated. Ascending aorta: The ascending aorta was normal in size. Aortic arch: The aortic arch was normal in size. Descending aorta: The descending aorta was normal in size.  ------------------------------------------------------------ Mitral valve: Structurally normal valve. Leaflet separation was normal. No evidence of vegetation. Doppler: Mild regurgitation.  ------------------------------------------------------------ Left atrium: The atrium was severely dilated. No evidence of thrombus in the atrial cavity or appendage. There was moderatecontinuous spontaneous echo contrast ("smoke") in the cavity. The appendage was well visualized, morphologically a left appendage, and of normal size. Emptying velocity was reduced.  ------------------------------------------------------------ Atrial septum: No defect or patent foramen ovale was identified.  ------------------------------------------------------------ Right ventricle: Poorly visualized. The cavity size was mildly dilated. Wall thickness was normal. Systolic function was normal.  ------------------------------------------------------------ Pulmonic valve: Structurally normal valve. Cusp separation was normal. No evidence of vegetation.  ------------------------------------------------------------ Tricuspid valve: Poorly visualized. Structurally normal valve. Leaflet separation was  normal. No evidence of vegetation. Doppler: No regurgitation.  ------------------------------------------------------------ Pulmonary artery: The main pulmonary artery was normal-sized.  ------------------------------------------------------------ Right atrium: The atrium was severely dilated. No evidence of thrombus in the atrial cavity or appendage. There was severecontinuous spontaneous echo contrast ("smoke") in the cavity. The appendage was morphologically a right appendage.  ------------------------------------------------------------ Pericardium: The pericardium was normal in appearance. There was no pericardial effusion.  ------------------------------------------------------------ Post procedure conclusions Ascending Aorta:  - The aorta was normal, not dilated, and non-diseased.  ------------------------------------------------------------ Prepared and Electronically Authenticated by  Fransico Him, MD 2015-05-04T17:16:20     CARDIAC CATHETERIZATION  PROCEDURE: Left heart catheterization with selective coronary angiography, right heart catheterization.  INDICATIONS: CHF  The risks, benefits, and details of the procedure were explained to the patient. The patient verbalized understanding and wanted to proceed. Informed written consent was obtained.  PROCEDURE TECHNIQUE: After Xylocaine anesthesia a 61F slender sheath was placed in the right radial artery with a single anterior needle wall stick. Right coronary angiography was done using a Judkins R4 guide catheter. Left coronary angiography was done using a Judkins L3.5 guide catheter. Left ventriculography was done using a pigtail catheter. A TR band was used for hemostasis.  CONTRAST: Total of 50 cc.  COMPLICATIONS: None.  HEMODYNAMICS: Aortic pressure was 108/73; LV pressure was 107/10; LVEDP 24. There was no gradient between the left ventricle and aorta. Right atrial pressure 16/17, mean gradient or pressure 15 mmHg.  RV pressure 35/12, RVEDP 15 mmHg. PA pressure 39/24, mean PA pressure 29 mmHg. Pulmonic capillary wedge pressure 25/26, mean pulmonary capillary wedge pressure 24 mmHg. Pulmonary artery saturation, 62%. Aortic saturation 91%. Cardiac output 5.5 L per minute. Cardiac index 2.3.  ANGIOGRAPHIC DATA: The left main coronary artery is widely patent.  The left anterior descending artery is a medium size vessel which does reach the apex. There is mild to moderate diffuse disease throughout the vessel. In the mid vessel, there is a focal 40% stenosis. There is a large first diagonal. There is a focal 75% stenosis in the midportion of this diagonal.  The left circumflex artery is a large dominant vessel. In the mid vessel, there is a 50-60% stenosis. The first obtuse marginal is small. Is patent. Second obtuse marginal is medium size and appears widely patent. The third obtuse marginal is patent as well.  The right coronary artery is medium sized proximally. It  is a nondominant vessel . There is a 50% lesion in the mid vessel. The distal vessel is very small.  LEFT VENTRICULOGRAM: Left ventricular angiogram was not done due to renal insufficiency. By echocardiogram, ejection fraction of 20%. LVEDP was 24 mmHg.  IMPRESSIONS:  1. Normal left main coronary artery. 2. Mild to moderate disease in the left anterior descending artery and its branches. There is a 75% first diagonal lesion.  3. Mild to moderate disease in the left circumflex artery and its branches. 4. Mild disease in the nondominant right coronary artery. 5. LVEDP 24 mmHg. Ejection fraction 20% by echocardiogram. 6. Mean pulmonary capillary wedge pressure 24 mmHg. Pulmonary artery saturation, 62%. Aortic saturation 91%. Cardiac output 5.5 L per minute. Cardiac index 2.3.  RECOMMENDATION: Cardiomyopathy is well out of proportion to his degree of coronary artery disease. No revascularization is indicated at this time. Would reassess him clinically for  ischemia after his LV function improves with medical therapy. Continue medical therapy for her LV systolic dysfunction. Will start anticoagulation at about 8:00 tonight for his atrial fibrillation. Per Dr. Ellyn Hack, plan will be for cardioversion on Monday. Long-term followup with Dr. Angelena Form.     Impression:  The patient has recurrent persistent atrial fibrillation which has failed DC cardioversion on 2 occasions, most recently on amiodarone therapy. He remains symptomatic with complaints of exertional shortness of breath, fatigue, and palpitations. At the time of his original presentation in April transthoracic echocardiogram demonstrated significant left ventricular systolic dysfunction with ejection fraction estimated 20-25%. Catheterization was notable for moderate nonobstructive coronary artery disease, and the patient's cardiomyopathy has been attributed to tachycardia. Followup transthoracic echocardiogram has been scheduled for next month. Although the patient reports feeling better than he did at that time he was hospitalized last April, he continues to complain of significant exertional shortness of breath and fatigue. I have personally reviewed the patient's previous transthoracic and transesophageal echocardiograms, as well as his diagnostic cardiac catheterization. I agree that the long-term success of an attempt at catheter-based radiofrequency ablation for atrial fibrillation might be somewhat attenuated by the presence of significant left atrial enlargement.  Other options include long-term medical therapy with anticoagulation and rate control versus surgical maze procedure.   Plan:  I discussed treatment options at length with the patient in the office today.  We discussed prognosis with conventional medical therapy as well as the potential long-term benefits of surgical maze procedure including an estimated 75-80% likelihood of freedom from symptomatic atrial fibrillation  postoperatively. The patient understands that this would not likely improve his long-term survival, but it may decrease his long-term risk of stroke.  In addition, successful maze procedure offers the possibility that he might be able to eventually be able to stop long-term anticoagulation therapy.  Risks associated with a surgical maze procedure were discussed at length including a variety of alternative surgical approaches. All of his questions been addressed. At this point I recommend continued medical therapy in the short-term with plans to have the patient return in 4-6 weeks after a followup echocardiogram has been performed to reassess whether or not left ventricular function has recovered.  If the patient remains symptomatic despite showing evidence of improved left ventricular systolic function on medical therapy, it may be reasonable to consider proceeding with elective Maze procedure.  During the interval period time we will additionally obtained formal pulmonary function tests.   I spent in excess of 90 minutes during the conduct of this office consultation and >50% of this time involved  direct face-to-face encounter with the patient for counseling and/or coordination of their care.   Valentina Gu. Roxy Manns, MD 09/24/2013 5:55 PM

## 2013-09-29 DIAGNOSIS — C61 Malignant neoplasm of prostate: Secondary | ICD-10-CM | POA: Diagnosis not present

## 2013-10-06 DIAGNOSIS — C61 Malignant neoplasm of prostate: Secondary | ICD-10-CM | POA: Diagnosis not present

## 2013-10-06 DIAGNOSIS — N476 Balanoposthitis: Secondary | ICD-10-CM | POA: Diagnosis not present

## 2013-10-06 DIAGNOSIS — N529 Male erectile dysfunction, unspecified: Secondary | ICD-10-CM | POA: Diagnosis not present

## 2013-10-08 ENCOUNTER — Ambulatory Visit (HOSPITAL_COMMUNITY)
Admission: RE | Admit: 2013-10-08 | Discharge: 2013-10-08 | Disposition: A | Discharge status: Home / Self Care | Payer: Medicare Other | Source: Ambulatory Visit | Attending: Thoracic Surgery (Cardiothoracic Vascular Surgery) | Admitting: Thoracic Surgery (Cardiothoracic Vascular Surgery)

## 2013-10-08 ENCOUNTER — Ambulatory Visit (HOSPITAL_BASED_OUTPATIENT_CLINIC_OR_DEPARTMENT_OTHER): Payer: Medicare Other | Admitting: Radiology

## 2013-10-08 DIAGNOSIS — R079 Chest pain, unspecified: Secondary | ICD-10-CM | POA: Insufficient documentation

## 2013-10-08 DIAGNOSIS — I4891 Unspecified atrial fibrillation: Secondary | ICD-10-CM

## 2013-10-08 DIAGNOSIS — E785 Hyperlipidemia, unspecified: Secondary | ICD-10-CM | POA: Insufficient documentation

## 2013-10-08 DIAGNOSIS — R05 Cough: Secondary | ICD-10-CM | POA: Insufficient documentation

## 2013-10-08 DIAGNOSIS — E7800 Pure hypercholesterolemia, unspecified: Secondary | ICD-10-CM

## 2013-10-08 DIAGNOSIS — R0609 Other forms of dyspnea: Secondary | ICD-10-CM | POA: Diagnosis not present

## 2013-10-08 DIAGNOSIS — R0989 Other specified symptoms and signs involving the circulatory and respiratory systems: Principal | ICD-10-CM | POA: Insufficient documentation

## 2013-10-08 DIAGNOSIS — R062 Wheezing: Secondary | ICD-10-CM | POA: Diagnosis not present

## 2013-10-08 DIAGNOSIS — I10 Essential (primary) hypertension: Secondary | ICD-10-CM | POA: Insufficient documentation

## 2013-10-08 DIAGNOSIS — Z87891 Personal history of nicotine dependence: Secondary | ICD-10-CM | POA: Insufficient documentation

## 2013-10-08 DIAGNOSIS — I482 Chronic atrial fibrillation, unspecified: Secondary | ICD-10-CM

## 2013-10-08 DIAGNOSIS — R059 Cough, unspecified: Secondary | ICD-10-CM | POA: Insufficient documentation

## 2013-10-08 DIAGNOSIS — I428 Other cardiomyopathies: Secondary | ICD-10-CM | POA: Insufficient documentation

## 2013-10-08 DIAGNOSIS — Z8546 Personal history of malignant neoplasm of prostate: Secondary | ICD-10-CM | POA: Insufficient documentation

## 2013-10-08 MED ORDER — ALBUTEROL SULFATE (2.5 MG/3ML) 0.083% IN NEBU
2.5000 mg | INHALATION_SOLUTION | Freq: Once | RESPIRATORY_TRACT | Status: AC
  Administered 2013-10-08: 2.5 mg via RESPIRATORY_TRACT

## 2013-10-08 NOTE — Progress Notes (Signed)
Echocardiogram performed.  

## 2013-10-09 LAB — PULMONARY FUNCTION TEST
DL/VA % pred: 68 %
DL/VA: 3.31 ml/min/mmHg/L
DLCO cor % pred: 46 %
DLCO cor: 17.59 ml/min/mmHg
DLCO unc % pred: 46 %
DLCO unc: 17.59 ml/min/mmHg
FEF 25-75 Post: 3.05 L/sec
FEF 25-75 Pre: 3.32 L/sec
FEF2575-%Change-Post: -8 %
FEF2575-%Pred-Post: 111 %
FEF2575-%Pred-Pre: 121 %
FEV1-%Change-Post: 0 %
FEV1-%Pred-Post: 73 %
FEV1-%Pred-Pre: 74 %
FEV1-Post: 2.72 L
FEV1-Pre: 2.74 L
FEV1FVC-%Change-Post: -3 %
FEV1FVC-%Pred-Pre: 113 %
FEV6-%Change-Post: 4 %
FEV6-%Pred-Post: 71 %
FEV6-%Pred-Pre: 68 %
FEV6-Post: 3.39 L
FEV6-Pre: 3.25 L
FEV6FVC-%Pred-Post: 105 %
FEV6FVC-%Pred-Pre: 105 %
FVC-%Change-Post: 2 %
FVC-%Pred-Post: 67 %
FVC-%Pred-Pre: 65 %
FVC-Post: 3.39 L
FVC-Pre: 3.3 L
Post FEV1/FVC ratio: 80 %
Post FEV6/FVC ratio: 100 %
Pre FEV1/FVC ratio: 83 %
Pre FEV6/FVC Ratio: 100 %
RV % pred: 72 %
RV: 1.98 L
TLC % pred: 70 %
TLC: 5.56 L

## 2013-10-13 ENCOUNTER — Ambulatory Visit (INDEPENDENT_AMBULATORY_CARE_PROVIDER_SITE_OTHER): Payer: Medicare Other | Admitting: Thoracic Surgery (Cardiothoracic Vascular Surgery)

## 2013-10-13 ENCOUNTER — Other Ambulatory Visit: Payer: Self-pay

## 2013-10-13 ENCOUNTER — Ambulatory Visit (INDEPENDENT_AMBULATORY_CARE_PROVIDER_SITE_OTHER): Payer: Medicare Other | Admitting: Podiatry

## 2013-10-13 ENCOUNTER — Other Ambulatory Visit: Payer: Self-pay | Admitting: *Deleted

## 2013-10-13 ENCOUNTER — Encounter: Payer: Self-pay | Admitting: Thoracic Surgery (Cardiothoracic Vascular Surgery)

## 2013-10-13 VITALS — BP 136/88 | HR 75 | Ht 74.0 in | Wt 233.0 lb

## 2013-10-13 DIAGNOSIS — B351 Tinea unguium: Secondary | ICD-10-CM

## 2013-10-13 DIAGNOSIS — I5042 Chronic combined systolic (congestive) and diastolic (congestive) heart failure: Secondary | ICD-10-CM | POA: Diagnosis not present

## 2013-10-13 DIAGNOSIS — I4891 Unspecified atrial fibrillation: Secondary | ICD-10-CM

## 2013-10-13 DIAGNOSIS — M79609 Pain in unspecified limb: Secondary | ICD-10-CM | POA: Diagnosis not present

## 2013-10-13 DIAGNOSIS — I482 Chronic atrial fibrillation, unspecified: Secondary | ICD-10-CM

## 2013-10-13 DIAGNOSIS — M79673 Pain in unspecified foot: Secondary | ICD-10-CM

## 2013-10-13 DIAGNOSIS — I428 Other cardiomyopathies: Secondary | ICD-10-CM | POA: Diagnosis not present

## 2013-10-13 DIAGNOSIS — R0609 Other forms of dyspnea: Secondary | ICD-10-CM | POA: Diagnosis not present

## 2013-10-13 DIAGNOSIS — R5383 Other fatigue: Secondary | ICD-10-CM | POA: Diagnosis not present

## 2013-10-13 DIAGNOSIS — I4819 Other persistent atrial fibrillation: Secondary | ICD-10-CM

## 2013-10-13 DIAGNOSIS — I2510 Atherosclerotic heart disease of native coronary artery without angina pectoris: Secondary | ICD-10-CM | POA: Diagnosis not present

## 2013-10-13 DIAGNOSIS — R5381 Other malaise: Secondary | ICD-10-CM | POA: Diagnosis not present

## 2013-10-13 DIAGNOSIS — Z8546 Personal history of malignant neoplasm of prostate: Secondary | ICD-10-CM

## 2013-10-13 LAB — BASIC METABOLIC PANEL
BUN: 23 mg/dL (ref 6–23)
CHLORIDE: 104 meq/L (ref 96–112)
CO2: 25 mEq/L (ref 19–32)
Calcium: 9.7 mg/dL (ref 8.4–10.5)
Creat: 1.3 mg/dL (ref 0.50–1.35)
Glucose, Bld: 108 mg/dL — ABNORMAL HIGH (ref 70–99)
Potassium: 4.5 mEq/L (ref 3.5–5.3)
SODIUM: 142 meq/L (ref 135–145)

## 2013-10-13 NOTE — Patient Instructions (Addendum)
Stop taking Eliquis (apixaban - blood thinner) after you take it on August 26th in anticipation of surgery September 2nd.  Continue taking all other medications without change through the day before surgery.

## 2013-10-13 NOTE — Progress Notes (Signed)
   Subjective:    Patient ID: Jose Snyder, male    DOB: 1940/12/31, 73 y.o.   MRN: [id number]  HPI  Pt presents for nail debridment   Review of Systems     Objective:   Physical Exam        Assessment & Plan:

## 2013-10-13 NOTE — Progress Notes (Signed)
Subjective:     Patient ID: Jose Snyder, male   DOB: 11/20/1940, 73 y.o.   MRN: [id number]  HPI patient presents with thick brittle nails that he cannot cut   Review of Systems     Objective:   Physical Exam Neurovascular status intact with muscle strength adequate and thick yellow brittle nailbeds 1-5 of both feet    Assessment:     Mycotic nail infection is with pain 1-5 both feet    Plan:     Debris painful nailbeds 1-5 both feet with no iatrogenic bleeding noted      

## 2013-10-13 NOTE — Progress Notes (Signed)
[address]       [place] [address]             [phone]     CARDIOTHORACIC SURGERY OFFICE NOTE  Referring Provider is Thompson Grayer, MD Primary Cardiologist is Lelon Perla, MD PCP is Verline Lema, MD   HPI:  Patient returns to the office today for followup of symptomatic chronic persistent atrial fibrillation with chronic combined systolic and diastolic congestive heart failure and nonischemic cardiomyopathy. He was originally seen in consultation on 09/24/2013. Since then he underwent a followup echocardiogram and pulmonary function tests. Over the past month he reports no significant change. He continues to experience significant exertional shortness of breath and fatigue. He has palpitations and occasional dizzy spells without syncope. His review of systems is unchanged.   Current Outpatient Prescriptions  Medication Sig Dispense Refill  . amiodarone (PACERONE) 200 MG tablet Take 1 tablet (200 mg total) by mouth daily.      Marland Kitchen apixaban (ELIQUIS) 5 MG TABS tablet Take 1 tablet (5 mg total) by mouth 2 (two) times daily.  60 tablet  11  . atorvastatin (LIPITOR) 20 MG tablet Take 1 tablet (20 mg total) by mouth daily.  30 tablet  11  . Cholecalciferol (VITAMIN D) 2000 UNITS tablet Take 2,000 Units by mouth daily.      . furosemide (LASIX) 40 MG tablet Take 1 tablet (40 mg total) by mouth daily.  30 tablet  11  . lisinopril (PRINIVIL,ZESTRIL) 10 MG tablet Take 5 mg by mouth daily.      . metoprolol succinate (TOPROL XL) 50 MG 24 hr tablet Take 1 tablet (50 mg total) by mouth daily.  30 tablet  12  . omeprazole (PRILOSEC) 20 MG capsule Take 20 mg by mouth daily.      . potassium chloride SA (K-DUR,KLOR-CON) 20 MEQ tablet Take 1 tablet (20 mEq total) by mouth daily.  30 tablet  11  . temazepam (RESTORIL) 15 MG capsule Take 15 mg by mouth at bedtime.       No current facility-administered medications for this visit.      Physical Exam:   BP  136/88  Pulse 75  Ht 6\' 2"  (1.88 m)  Wt 233 lb (105.688 kg)  BMI 29.90 kg/m2  SpO2 97%  General:  Moderately obese but well-appearing  Chest:   Clear to auscultation  CV:   Irregular rate and rhythm without murmur  Incisions:  n/a  Abdomen:  Soft and nontender  Extremities:  Warm and well-perfused  Diagnostic Tests:  Transthoracic Echocardiography  Patient: Jose Snyder, Jose Snyder MR #: [id number] Study Date: 10/08/2013 Gender: M Age: 72 Height: 188 cm Weight: 104.3 kg BSA: 2.35 m^2 Pt. Status: Room:  ATTENDING Jenkins Rouge, M.D. [facility] SONOGRAPHER Cindy Hazy, RDCS PERFORMING [facility], Outpatient  cc:  ------------------------------------------------------------------- LV EF: 25%  ------------------------------------------------------------------- Indications: Atrial fibrillation - 427.31.  ------------------------------------------------------------------- History: PMH: Chronic Atrial Fibrillaiton. Nonischemic Cardiomyopathy. Chest Pain. History of Prostate Cancer. Risk factors: Hypertension. Dyslipidemia.  ------------------------------------------------------------------- Study Conclusions  - Left ventricle: The cavity size was moderately dilated. The estimated ejection fraction was 25%. Diffuse hypokinesis. - Mitral valve: There was mild regurgitation. - Left atrium: The atrium was severely dilated. - Atrial septum: No defect or patent foramen ovale was identified.  Transthoracic echocardiography. M-mode, complete 2D, spectral Doppler, and color Doppler. Birthdate: Patient birthdate: February 19, 1941. Age: Patient is 73 yr old. Sex: Gender: male. BMI: 29.5 kg/m^2. Blood pressure:  126/82 Patient status: Outpatient. Study date: Study date: 10/08/2013. Study time: 09:42 AM. Location: [facility]  3  -------------------------------------------------------------------  ------------------------------------------------------------------- Left ventricle: The cavity size was moderately dilated. The estimated ejection fraction was 25%. Diffuse hypokinesis.  ------------------------------------------------------------------- Aortic valve: Mildly calcified leaflets. Doppler: There was no stenosis.  ------------------------------------------------------------------- Aorta: The aorta was normal, not dilated, and non-diseased.  ------------------------------------------------------------------- Mitral valve: Doppler: There was mild regurgitation. Peak gradient (D): 6 mm Hg.  ------------------------------------------------------------------- Left atrium: The atrium was severely dilated.  ------------------------------------------------------------------- Atrial septum: No defect or patent foramen ovale was identified.  ------------------------------------------------------------------- Right ventricle: The cavity size was normal. Wall thickness was normal. Systolic function was normal.  ------------------------------------------------------------------- Pulmonic valve: Structurally normal valve. Cusp separation was normal. Doppler: Transvalvular velocity was within the normal range. There was trivial regurgitation.  ------------------------------------------------------------------- Tricuspid valve: Doppler: There was trivial regurgitation.  ------------------------------------------------------------------- Right atrium: The atrium was normal in size.  ------------------------------------------------------------------- Pericardium: The pericardium was normal in appearance.  ------------------------------------------------------------------- Post procedure conclusions Ascending Aorta:  - The aorta was normal, not dilated, and  non-diseased.  ------------------------------------------------------------------- Measurements  Left ventricle Value Reference LV ID, ED, PLAX chordal 52 mm 43 - 52 LV ID, ES, PLAX chordal 38 mm 23 - 38 LV fx shortening, PLAX chordal (L) 27 % >=29 LV PW thickness, ED 10 mm --------- IVS/LV PW ratio, ED 1 <=1.3 Stroke volume, 2D 43 ml --------- Stroke volume/bsa, 2D 18 ml/m^2 ---------  Ventricular septum Value Reference IVS thickness, ED 10 mm ---------  LVOT Value Reference LVOT ID, S 23 mm --------- LVOT area 4.15 cm^2 --------- LVOT VTI, S 10.3 cm ---------  Aorta Value Reference Aortic root ID, ED 37 mm ---------  Left atrium Value Reference LA ID, A-P, ES 51 mm --------- LA ID/bsa, A-P 2.17 cm/m^2 <=2.2  Mitral valve Value Reference Mitral E-wave peak velocity 126 cm/s --------- Mitral deceleration time (L) 120 ms 150 - 230 Mitral peak gradient, D 6 mm Hg ---------  Tricuspid valve Value Reference Tricuspid regurg peak velocity 215 cm/s --------- Tricuspid peak RV-RA gradient 18 mm Hg --------- Tricuspid maximal regurg velocity, 215 cm/s --------- PISA  Right ventricle Value Reference RV s&', lateral, S 10 cm/s ---------  Legend: (L) and (H) mark values outside specified reference range.  ------------------------------------------------------------------- Prepared and Electronically Authenticated by  Jenkins Rouge, M.D. 2015-08-12T12:32:52    Pulmonary Function Tests  Baseline      Post-bronchodilator  FVC  3.30 L  (65% predicted) FVC  3.39 L  (67% predicted) FEV1  2.74 L  (74% predicted) FEV1  2.72 L  (73% predicted) FEF25-75 3.32 L  (121% predicted) FEF25-75 3.05 L  (111% predicted)  TLC  5.56 L  (70% predicted) RV  1.98 L  (72% predicted) DLCO  46% predicted      Impression:  The patient has recurrent persistent atrial fibrillation which has failed DC cardioversion on 2 occasions, most recently on amiodarone therapy. He remains  symptomatic with complaints of exertional shortness of breath, fatigue, and palpitations.  Followup echocardiogram demonstrates no significant change in left ventricular function with moderate to severe global systolic dysfunction and ejection fraction estimated 25%.  Options include long-term medical therapy with anticoagulation rate control versus an attempt at catheter-based radiofrequency ablation for atrial fibrillation versus surgical maze procedure.   Plan:  I have again reviewed options at length with the patient in the office today. He hopes to proceed with minimally invasive Maze procedure in the near future as the most potentially definitive means to treat  his atrial fibrillation. All of his questions have been addressed. We will arrange for CT angiogram of the chest abdomen and pelvis to evaluate the potential feasibility of peripheral access for cannulation for surgery. This will require administration of IV contrast. Renal function will need to be checked prior to CT angiogram. The patient will return for followup on Monday, 10/27/2013 with tentative plans to proceed with surgery on Wednesday, 10/29/2013. The patient has been instructed to stop taking Eliquis one week before surgery.  Under the circumstances I do not feel that the bridging him with Lovenox or heparin are necessary.  I spent in excess of 15 minutes during the conduct of this office consultation and >50% of this time involved direct face-to-face encounter with the patient for counseling and/or coordination of their care.  Valentina Gu. Roxy Manns, MD 10/13/2013 11:23 AM

## 2013-10-13 NOTE — Progress Notes (Signed)
Subjective:     Patient ID: Jose Snyder, male   DOB: August 27, 1940, 73 y.o.   MRN: [id number]  HPI   Review of Systems     Objective:   Physical Exam     Assessment:         Plan:

## 2013-10-16 ENCOUNTER — Encounter (HOSPITAL_COMMUNITY): Payer: Self-pay | Admitting: Pharmacy Technician

## 2013-10-20 ENCOUNTER — Encounter: Payer: Self-pay | Admitting: Internal Medicine

## 2013-10-20 ENCOUNTER — Ambulatory Visit (INDEPENDENT_AMBULATORY_CARE_PROVIDER_SITE_OTHER): Payer: Medicare Other | Admitting: Internal Medicine

## 2013-10-20 VITALS — BP 120/87 | HR 99 | Ht 74.0 in | Wt 245.6 lb

## 2013-10-20 DIAGNOSIS — I428 Other cardiomyopathies: Secondary | ICD-10-CM | POA: Diagnosis not present

## 2013-10-20 DIAGNOSIS — I5042 Chronic combined systolic (congestive) and diastolic (congestive) heart failure: Secondary | ICD-10-CM | POA: Diagnosis not present

## 2013-10-20 DIAGNOSIS — I4891 Unspecified atrial fibrillation: Secondary | ICD-10-CM | POA: Diagnosis not present

## 2013-10-20 DIAGNOSIS — I10 Essential (primary) hypertension: Secondary | ICD-10-CM

## 2013-10-20 DIAGNOSIS — I2510 Atherosclerotic heart disease of native coronary artery without angina pectoris: Secondary | ICD-10-CM

## 2013-10-20 NOTE — Progress Notes (Signed)
Primary Care Physician: Verline Lema, MD Primary Cardiologist: Dr. Kirk Ruths Primary Electrophysiologist: Dr. Thompson Grayer Referring Physician: Dr. Vern Claude Pozo is a 73 y.o. male with a h/o persistent atrial fibrillation who presents for follow up in the  [facility].  The patient was initially diagnosed with atrial fibrillation 4/15 after presenting with symptoms of dyspnea and found to be in new onset afib with RVR associated with  heart failure symptoms with an EF of 20-25% . Pt was diuresed with improvement of symptoms and underwent LHC with mod obstructive disease not requiring intervention, felt to be out of proportion to his cardiomyopathy. First attempt at cardioversion was unsuccessful. He was then loaded on amiodarone and successfully underwent DCCV 3 weeks later, but maintined SR for only 2-3 weeks.. He has had nausea with amiodarone, improved with dose reduction, but has noticed a few hand tremors. He was referred to Dr. Roxy Manns for a maze procedure, thought not to be an optimal pt for PVI. He is scheduled to have this done 10/29/13. He also feels like he is retaining more fluid with his weight up 6-7 pounds with more exertional dyspnea. Lasix last increased 2-3 weeks ago with  40 mg daily  increased to 40 bid. Despite this, weight continues to go up. Denies PND/Orthopnea. No issues with apixaban but will hold drug on the 26th in preparation for maze.    Atrial Fibrillation Risk Factors:  he does have symptoms or diagnosis of sleep apnea. he  Is pending sleep study  he does not have a history of rheumatic fever.  he does have a history of alcohol use.  he has a BMI of Body mass index is 31.52 kg/(m^2).Marland Kitchen  LA size: 48   Atrial Fibrillation Management history:  Previous antiarrhythmic drugs: amiodarone  Previous cardioversions: 4/15 and 6/4  Previous ablations: none  CHADS2VASC score: at least 4.  Anticoagulation history: on  apixaban.   Past Medical History  Diagnosis Date  . Hypertension   . Obesity   . Prostate cancer   . GERD (gastroesophageal reflux disease)   . Chronic combined systolic and diastolic heart failure 1/93/7902    a.  Echo (06/25/13):  EF 20-25%, diff HK, restrictive physio, mild MR, mod to severe LAE, mild RVE, mildly reduced RVSF, mod to severe RAE  . NICM (nonischemic cardiomyopathy) with EF 20-25% 06/26/2013  . Atrial Fibrillation  06/25/2013  . Dyslipidemia 07/15/2013  . CAD (coronary artery disease), native coronary artery 06/28/2013    a. LHC (06/27/13):  Mid LAD 40%, mid D1 75%, mid CFX 50-60%, mid RCA 50%, EF 20%, LVEDP 24 mmHg.   Past Surgical History  Procedure Laterality Date  . Medial partial knee replacement Left 1990's?  . Transurethral resection of prostate  2010  . Tee without cardioversion N/A 06/30/2013    Procedure: TRANSESOPHAGEAL ECHOCARDIOGRAM (TEE) ;  Surgeon: [name], MD;  Location: [facility];  Service: Cardiovascular;  Laterality: N/A;  . Cardioversion N/A 06/30/2013    Procedure: CARDIOVERSION;  Surgeon: [name], MD;  Location: [facility];  Service: Cardiovascular;  Laterality: N/A;  . Cardioversion N/A 07/31/2013    Procedure: CARDIOVERSION;  Surgeon: Thayer Headings, MD;  Location: [facility] ENDOSCOPY;  Service: Cardiovascular;  Laterality: N/A;    Current Outpatient Prescriptions  Medication Sig Dispense Refill  . amiodarone (PACERONE) 200 MG tablet Take 1 tablet (200 mg total) by mouth daily.      Marland Kitchen apixaban (ELIQUIS) 5 MG  TABS tablet Take 1 tablet (5 mg total) by mouth 2 (two) times daily.  60 tablet  11  . atorvastatin (LIPITOR) 20 MG tablet Take 1 tablet (20 mg total) by mouth daily.  30 tablet  11  . Cholecalciferol (VITAMIN D) 2000 UNITS tablet Take 2,000 Units by mouth daily.      . furosemide (LASIX) 40 MG tablet Take 40 mg by mouth 2 (two) times daily.      Marland Kitchen lisinopril (PRINIVIL,ZESTRIL) 10 MG tablet Take 5 mg by mouth daily.      . Melatonin 10  MG CAPS Take 10 mg by mouth at bedtime as needed (sleep).      . metoprolol succinate (TOPROL XL) 50 MG 24 hr tablet Take 1 tablet (50 mg total) by mouth daily.  30 tablet  12  . omeprazole (PRILOSEC) 20 MG capsule Take 20 mg by mouth daily.      . potassium chloride SA (K-DUR,KLOR-CON) 20 MEQ tablet Take 1 tablet (20 mEq total) by mouth daily.  30 tablet  11  . temazepam (RESTORIL) 15 MG capsule Take 15 mg by mouth at bedtime as needed.        No current facility-administered medications for this visit.    No Known Allergies  History   Social History  . Marital Status: Widowed    Spouse Name: N/A    Number of Children: N/A  . Years of Education: N/A   Occupational History  . Not on file.   Social History Main Topics  . Smoking status: Never Smoker   . Smokeless tobacco: Never Used     Comment: 06/25/2013 "smoked very light; years and years ago"  . Alcohol Use: 8.4 oz/week    7 Glasses of wine, 7 Shots of liquor per week  . Drug Use: No  . Sexual Activity: Not Currently   Other Topics Concern  . Not on file   Social History Narrative   He lives in [place]. He is widowed. Wife passed of breast cancer last year. No children.     No family history on file. The patient does not  have a history of early familial atrial fibrillation or other arrhythmias.  ROS- All systems are reviewed and negative except as per the HPI above.  Physical Exam: BP 120/87 HR 99 irreg Wt 245 lbs., 111 kg   GEN- The patient is well appearing, alert and oriented x 3 today.   Head- normocephalic, atraumatic Eyes-  Sclera clear, conjunctiva pink Ears- hearing intact Oropharynx- clear Neck- supple, no JVP Lymph- no cervical lymphadenopathy Lungs- Clear to ausculation bilaterally, normal work of breathing Heart- Irregular rate and rhythm, no murmurs, rubs or gallops, PMI not laterally displaced GI- soft, NT, ND, + BS Extremities- no clubbing, cyanosis, or edema MS- no significant  deformity or atrophy Skin- no rash or lesion Psych- euthymic mood, full affect Neuro- strength and sensation are intact  EKG Aflutter atypical at 94 bpm.  Echo  EF 20-25% with diffuse hypokinesis. Left atrium at 48, moderately to severely enlarged. Rt atrium also mod to sev dilated with rt ventricle mildly dilated.   Assessment and Plan:  1. Atrial fibrillation The patient has symptomatic  persistent atrial fibrillation.  The patients CHAD2VASC score is  At least 4.  he is  appropriately anticoagulated at this time. The patient is adequately rate controlled with Metoprolol. Antiarrhythmic therapy to date includes amiodarone not maintaining SR.  The patients left atrial size is 49 +mm.  Additional echo  findings include EF 20-25%.  Not an optimal pt for PVI. Evaluated for maze procedure and is pending 9-2.   Presently, our recommendations include :  1.Patient does not appear a likely candidate for PVI, due to atrial size and  failure to maintain NSR,  despite   cardioversion x2 and antiarrhythmics. He has been evaluated for maze procedure which is pending with Dr. Roxy Manns 9/2.  2. Mobesity As above, lifestyle modification was discussed at length including regular exercise and weight reduction.  3. Obstructive sleep apnea The importance of adequate treatment of sleep apnea was discussed today in order to improve our ability to maintain sinus rhythm long term. Encouraged to follow through with scheduled study.  4. Continue with amiodarone for now as well as metoprolol succinate  50 mg a day for  rate control.  5. Acute on chronic systolic heart failure with recent fluid weight gain and increased exertional dyspnea. Increase lasix to 80 mg bid x 3 days as well as potassium 20 mg bid x 3 days only then resume usual dose.  5. Follow up in AF clinic in 3 months.     Roderic Palau NP  Nurse Practitioner, [facility] 10/20/2013 3:09 PM  I have seen,  examined the patient, and reviewed the above assessment and plan with Roderic Palau NP.  Changes to above are made where necessary.  MAZE planned.  We will see 3 months post procedure in the AF clinic  Co Sign:Kennie Snedden MDMD 10/20/2013 3:09 PM

## 2013-10-20 NOTE — Patient Instructions (Signed)
Your physician recommends that you schedule a follow-up appointment in: 3 months with Roderic Palau, NP in [place] clinic   Your physician has recommended you make the following change in your medication:  1) Increase your Furosemide to 80mg  twice daily for 3 days then back to normal dose 2) Increase Potassium to twice daily for 3 days then back to normal dose

## 2013-10-22 ENCOUNTER — Ambulatory Visit
Admission: RE | Admit: 2013-10-22 | Discharge: 2013-10-22 | Disposition: A | Discharge status: Home / Self Care | Payer: Medicare Other | Source: Ambulatory Visit | Attending: Thoracic Surgery (Cardiothoracic Vascular Surgery) | Admitting: Thoracic Surgery (Cardiothoracic Vascular Surgery)

## 2013-10-22 DIAGNOSIS — I700 Atherosclerosis of aorta: Secondary | ICD-10-CM

## 2013-10-22 DIAGNOSIS — I719 Aortic aneurysm of unspecified site, without rupture: Secondary | ICD-10-CM | POA: Insufficient documentation

## 2013-10-22 DIAGNOSIS — Z8546 Personal history of malignant neoplasm of prostate: Secondary | ICD-10-CM

## 2013-10-22 DIAGNOSIS — I4891 Unspecified atrial fibrillation: Secondary | ICD-10-CM

## 2013-10-22 HISTORY — DX: Aortic aneurysm of unspecified site, without rupture: I71.9

## 2013-10-22 MED ORDER — IOHEXOL 350 MG/ML SOLN
80.0000 mL | Freq: Once | INTRAVENOUS | Status: AC | PRN
  Administered 2013-10-22: 80 mL via INTRAVENOUS

## 2013-10-27 ENCOUNTER — Ambulatory Visit (HOSPITAL_COMMUNITY)
Admission: RE | Admit: 2013-10-27 | Discharge: 2013-10-27 | Disposition: A | Discharge status: Home / Self Care | Payer: Medicare Other | Source: Ambulatory Visit | Attending: Thoracic Surgery (Cardiothoracic Vascular Surgery) | Admitting: Thoracic Surgery (Cardiothoracic Vascular Surgery)

## 2013-10-27 ENCOUNTER — Encounter (HOSPITAL_COMMUNITY)
Admission: RE | Admit: 2013-10-27 | Discharge: 2013-10-27 | Disposition: A | Discharge status: Home / Self Care | Payer: Medicare Other | Source: Ambulatory Visit | Attending: Thoracic Surgery (Cardiothoracic Vascular Surgery) | Admitting: Thoracic Surgery (Cardiothoracic Vascular Surgery)

## 2013-10-27 ENCOUNTER — Ambulatory Visit (INDEPENDENT_AMBULATORY_CARE_PROVIDER_SITE_OTHER): Payer: Medicare Other | Admitting: Thoracic Surgery (Cardiothoracic Vascular Surgery)

## 2013-10-27 ENCOUNTER — Encounter: Payer: Self-pay | Admitting: Thoracic Surgery (Cardiothoracic Vascular Surgery)

## 2013-10-27 ENCOUNTER — Encounter (HOSPITAL_COMMUNITY): Payer: Self-pay

## 2013-10-27 VITALS — BP 123/78 | HR 83 | Ht 74.0 in | Wt 245.0 lb

## 2013-10-27 VITALS — BP 114/85 | HR 106 | Temp 98.3°F | Resp 18 | Wt 241.4 lb

## 2013-10-27 DIAGNOSIS — I2510 Atherosclerotic heart disease of native coronary artery without angina pectoris: Secondary | ICD-10-CM

## 2013-10-27 DIAGNOSIS — I428 Other cardiomyopathies: Secondary | ICD-10-CM | POA: Insufficient documentation

## 2013-10-27 DIAGNOSIS — I700 Atherosclerosis of aorta: Secondary | ICD-10-CM

## 2013-10-27 DIAGNOSIS — I719 Aortic aneurysm of unspecified site, without rupture: Secondary | ICD-10-CM

## 2013-10-27 DIAGNOSIS — I5042 Chronic combined systolic (congestive) and diastolic (congestive) heart failure: Secondary | ICD-10-CM | POA: Insufficient documentation

## 2013-10-27 DIAGNOSIS — E785 Hyperlipidemia, unspecified: Secondary | ICD-10-CM | POA: Insufficient documentation

## 2013-10-27 DIAGNOSIS — I4819 Other persistent atrial fibrillation: Secondary | ICD-10-CM

## 2013-10-27 DIAGNOSIS — I4891 Unspecified atrial fibrillation: Secondary | ICD-10-CM | POA: Diagnosis not present

## 2013-10-27 DIAGNOSIS — Z01810 Encounter for preprocedural cardiovascular examination: Secondary | ICD-10-CM | POA: Insufficient documentation

## 2013-10-27 DIAGNOSIS — I509 Heart failure, unspecified: Secondary | ICD-10-CM | POA: Insufficient documentation

## 2013-10-27 DIAGNOSIS — I6529 Occlusion and stenosis of unspecified carotid artery: Secondary | ICD-10-CM | POA: Insufficient documentation

## 2013-10-27 DIAGNOSIS — I129 Hypertensive chronic kidney disease with stage 1 through stage 4 chronic kidney disease, or unspecified chronic kidney disease: Secondary | ICD-10-CM | POA: Insufficient documentation

## 2013-10-27 DIAGNOSIS — N189 Chronic kidney disease, unspecified: Secondary | ICD-10-CM | POA: Insufficient documentation

## 2013-10-27 DIAGNOSIS — Z01818 Encounter for other preprocedural examination: Secondary | ICD-10-CM | POA: Diagnosis not present

## 2013-10-27 DIAGNOSIS — I658 Occlusion and stenosis of other precerebral arteries: Secondary | ICD-10-CM | POA: Insufficient documentation

## 2013-10-27 HISTORY — DX: Shortness of breath: R06.02

## 2013-10-27 HISTORY — DX: Heart failure, unspecified: I50.9

## 2013-10-27 HISTORY — DX: Cardiac arrhythmia, unspecified: I49.9

## 2013-10-27 HISTORY — DX: Anxiety disorder, unspecified: F41.9

## 2013-10-27 HISTORY — DX: Unspecified osteoarthritis, unspecified site: M19.90

## 2013-10-27 LAB — COMPREHENSIVE METABOLIC PANEL
ALBUMIN: 4.1 g/dL (ref 3.5–5.2)
ALK PHOS: 83 U/L (ref 39–117)
ALT: 15 U/L (ref 0–53)
AST: 16 U/L (ref 0–37)
Anion gap: 19 — ABNORMAL HIGH (ref 5–15)
BUN: 28 mg/dL — ABNORMAL HIGH (ref 6–23)
CHLORIDE: 101 meq/L (ref 96–112)
CO2: 18 mEq/L — ABNORMAL LOW (ref 19–32)
Calcium: 9.4 mg/dL (ref 8.4–10.5)
Creatinine, Ser: 1.64 mg/dL — ABNORMAL HIGH (ref 0.50–1.35)
GFR calc Af Amer: 47 mL/min — ABNORMAL LOW (ref >90)
GFR, EST NON AFRICAN AMERICAN: 40 mL/min — AB (ref >90)
GLUCOSE: 134 mg/dL — AB (ref 70–99)
POTASSIUM: 4.9 meq/L (ref 3.7–5.3)
Sodium: 138 mEq/L (ref 137–147)
Total Bilirubin: 0.4 mg/dL (ref 0.3–1.2)
Total Protein: 7.7 g/dL (ref 6.0–8.3)

## 2013-10-27 LAB — CBC
HEMATOCRIT: 45.4 % (ref 39.0–52.0)
HEMOGLOBIN: 15.1 g/dL (ref 13.0–17.0)
MCH: 30.9 pg (ref 26.0–34.0)
MCHC: 33.3 g/dL (ref 30.0–36.0)
MCV: 92.8 fL (ref 78.0–100.0)
Platelets: 266 10*3/uL (ref 150–400)
RBC: 4.89 MIL/uL (ref 4.22–5.81)
RDW: 15.8 % — AB (ref 11.5–15.5)
WBC: 11.4 10*3/uL — AB (ref 4.0–10.5)

## 2013-10-27 LAB — BLOOD GAS, ARTERIAL
ACID-BASE DEFICIT: 3.3 mmol/L — AB (ref 0.0–2.0)
BICARBONATE: 20.8 meq/L (ref 20.0–24.0)
Drawn by: 206361
FIO2: 0.21 %
O2 Saturation: 98.1 %
PCO2 ART: 34.7 mmHg — AB (ref 35.0–45.0)
Patient temperature: 98.6
TCO2: 21.8 mmol/L (ref 0–100)
pH, Arterial: 7.395 (ref 7.350–7.450)
pO2, Arterial: 102 mmHg — ABNORMAL HIGH (ref 80.0–100.0)

## 2013-10-27 LAB — URINALYSIS, ROUTINE W REFLEX MICROSCOPIC
Bilirubin Urine: NEGATIVE
Glucose, UA: NEGATIVE mg/dL
Hgb urine dipstick: NEGATIVE
KETONES UR: NEGATIVE mg/dL
LEUKOCYTES UA: NEGATIVE
NITRITE: NEGATIVE
PROTEIN: NEGATIVE mg/dL
Specific Gravity, Urine: 1.018 (ref 1.005–1.030)
Urobilinogen, UA: 0.2 mg/dL (ref 0.0–1.0)
pH: 5 (ref 5.0–8.0)

## 2013-10-27 LAB — HEMOGLOBIN A1C
Hgb A1c MFr Bld: 6.3 % — ABNORMAL HIGH (ref <5.7)
Mean Plasma Glucose: 134 mg/dL — ABNORMAL HIGH (ref <117)

## 2013-10-27 LAB — PROTIME-INR
INR: 1.1 (ref 0.00–1.49)
Prothrombin Time: 14.2 seconds (ref 11.6–15.2)

## 2013-10-27 LAB — TYPE AND SCREEN
ABO/RH(D): A POS
Antibody Screen: NEGATIVE

## 2013-10-27 LAB — APTT: aPTT: 31 seconds (ref 24–37)

## 2013-10-27 LAB — ABO/RH: ABO/RH(D): A POS

## 2013-10-27 LAB — SURGICAL PCR SCREEN
MRSA, PCR: POSITIVE — AB
Staphylococcus aureus: POSITIVE — AB

## 2013-10-27 NOTE — Pre-Procedure Instructions (Signed)
Jose Snyder  10/27/2013   Your procedure is scheduled on: Wednesday, October 29, 2013   Report to [facility] Admitting at 6:30 AM.  Call this number if you have problems the morning of surgery: [phone]   Remember:   Do not eat food or drink liquids after midnight Tuesday, October 28, 2013   Take these medicines the morning of surgery with A SIP OF WATER: amiodarone (PACERONE),  metoprolol succinate (TOPROL XL), omeprazole (PRILOSEC). Stop taking Multivitamins and herbal medications (Melatonin ). Do not take any NSAIDs ie: Ibuprofen, Advil, Naproxen or etc.  Do not wear jewelry, make-up or nail polish.  Do not wear lotions, powders, or perfumes. You may  not wear deodorant.  Do not shave 48 hours prior to surgery. Men may shave face and neck.  Do not bring valuables to the hospital.  [facility] is not responsible for any belongings or valuables.               Contacts, dentures or bridgework may not be worn into surgery.  Leave suitcase in the car. After surgery it may be brought to your room.  For patients admitted to the hospital, discharge time is determined by your treatment team.               Patients discharged the day of surgery will not be allowed to drive home.  Name and phone number of your driver: /w family  Special Instructions: Shower using CHG the night before surgery and the morning of surgery.   Please read over the following fact sheets that you were given: Pain Booklet, Coughing and Deep Breathing, Blood Transfusion Information, Open Heart Packet, MRSA Information and Surgical Site Infection Prevention

## 2013-10-27 NOTE — Progress Notes (Signed)
Pre-op Cardiac Surgery  Carotid Findings:  Findings suggest 1-39% internal carotid artery stenosis bilaterally. Vertebral arteries are patent with antegrade flow.  Upper Extremity Right Left  Brachial Pressures 100-Triphasic 103-Triphasic  Radial Waveforms Triphasic Triphasic  Ulnar Waveforms Triphasic Triphasic  Palmar Arch (Allen's Test) Signal is unaffected with radial compression, obliterates with ulnar compression. Signal obliterates with radial compression, is unaffected with ulnar compression.   10/27/2013 3:27 PM Maudry Mayhew, RVT, RDCS, [place], [place]

## 2013-10-27 NOTE — Progress Notes (Signed)
[address]       [place] [address]             [phone]     CARDIOTHORACIC SURGERY OFFICE NOTE  Referring Provider is Thompson Grayer, MD PCP is Verline Lema, MD   HPI:  Patient returns to the office today for followup of symptomatic chronic persistent atrial fibrillation with chronic combined systolic and diastolic congestive heart failure and nonischemic cardiomyopathy. He was originally seen in consultation on 09/24/2013 and he was seen more recently on 10/13/2013 at which time we made plans to proceed with maze procedure later this week.  Since then he has been seen in followup by Dr. Rayann Heman who adjusted his diuretic dose up a bit. Since then he has continued to do well, and he states that his breathing has improved with increased dose of diuretic. He reports no new problems or complaints and he is looking forward to proceeding with surgery as previously planned.  He stopped taking Xarelto last week in anticipation of surgery.  He is accompanied to the office today by his niece who also has been made his medical power of attorney.    Current Outpatient Prescriptions  Medication Sig Dispense Refill  . amiodarone (PACERONE) 200 MG tablet Take 1 tablet (200 mg total) by mouth daily.      Marland Kitchen apixaban (ELIQUIS) 5 MG TABS tablet Take 1 tablet (5 mg total) by mouth 2 (two) times daily.  60 tablet  11  . atorvastatin (LIPITOR) 20 MG tablet Take 1 tablet (20 mg total) by mouth daily.  30 tablet  11  . Cholecalciferol (VITAMIN D) 2000 UNITS tablet Take 2,000 Units by mouth daily.      . furosemide (LASIX) 40 MG tablet Take 40 mg by mouth 2 (two) times daily.      Marland Kitchen lisinopril (PRINIVIL,ZESTRIL) 10 MG tablet Take 5 mg by mouth daily.      . Melatonin 10 MG CAPS Take 10 mg by mouth at bedtime as needed (sleep).      . metoprolol succinate (TOPROL XL) 50 MG 24 hr tablet Take 1 tablet (50 mg total) by mouth daily.  30 tablet  12  . omeprazole (PRILOSEC) 20 MG capsule  Take 20 mg by mouth daily.      . potassium chloride SA (K-DUR,KLOR-CON) 20 MEQ tablet Take 1 tablet (20 mEq total) by mouth daily.  30 tablet  11  . temazepam (RESTORIL) 15 MG capsule Take 15 mg by mouth at bedtime as needed.        No current facility-administered medications for this visit.      Physical Exam:   BP 123/78  Pulse 83  Ht 6\' 2"  (1.88 m)  Wt 245 lb (111.131 kg)  BMI 31.44 kg/m2  SpO2 98%  General:  Well-appearing  Chest:   Clear  CV:   Irregular rate and rhythm  Incisions:  n/a  Abdomen:  Soft and nontender  Extremities:  Warm and well-perfused  Diagnostic Tests:  CT ANGIOGRAPHY CHEST, ABDOMEN AND PELVIS  TECHNIQUE:  Multidetector CT imaging through the chest, abdomen and pelvis was  performed using the standard protocol during bolus administration of  intravenous contrast. Multiplanar reconstructed images and MIPs were  obtained and reviewed to evaluate the vascular anatomy.  CONTRAST: 51mL OMNIPAQUE IOHEXOL 350 MG/ML SOLN  COMPARISON: None.  FINDINGS:  CTA CHEST FINDINGS  The lungs are well aerated bilaterally and demonstrate emphysematous  changes in both lungs  particularly in the apices. A small  right-sided pleural effusion is noted. No focal infiltrate or  pneumothorax is seen.  The thoracic inlet is within normal limits. The origins of the  brachiocephalic vessels are unremarkable. A truncus anomaly is  noted. Mild aortic calcifications are noted. No evidence of  aneurysmal dilatation is seen. Some irregular aortic plaque is noted  in the descending aorta. A small area of intimal irregularity is  noted in the descending aorta (image 85 of series 4) which may  represent a small penetrating ulcer or focal dissection. This  measures approximately 9 mm in greatest dimension. No significant  hilar or mediastinal adenopathy is noted. The pulmonary artery as  visualized is within normal limits.  The left atrium is well visualized without filling  defect. A left  atrial appendage is noted. Normal superior and inferior pulmonary  venous anatomy is noted bilaterally. The left main coronary artery  courses mildly posteriorly prior to its more normal course behind  the main pulmonary outflow trunk. Moderate coronary calcifications  are noted particularly in the left anterior descending coronary  artery. Degenerative changes of the thoracic spine are seen.  Review of the MIP images confirms the above findings.  CTA ABDOMEN AND PELVIS FINDINGS  Vascular: The abdominal aorta demonstrates diffuse mild  calcification without evidence of aneurysmal dilatation. Mild  calcifications are noted in the iliac vessels although no focal  aneurysmal dilatation or focal areas of stenosis are identified. The  inferior mesenteric artery, superior mesenteric artery and celiac  axis are widely patent. Dual renal arteries are noted on the right.  Three renal arteries are noted on the left.  Nonvascular:  The liver, spleen, adrenal glands, gallbladder and pancreas are  within normal limits. A few small accessory splenules are noted. The  kidneys demonstrate a normal enhancement pattern bilaterally. No  renal calculi or urinary tract obstructive changes are seen. Mild  diverticular change of the colon is noted. The appendix is well  visualized and within normal limits. The bladder is incompletely  distended. No prostatic enlargement is seen. No pelvic sidewall mass  or adenopathy is seen.  Review of the MIP images confirms the above findings.  IMPRESSION:  Irregular atherosclerotic plaque in the descending thoracic aorta  with an area of irregularity which may represent a small focal  dissection or penetrating ulcer.  No thoracic aortic aneurysm is identified. Normal pulmonary venous  anatomy is noted.  Multiple renal arteries bilaterally. The visceral vessels are  otherwise within normal limits.  No evidence of abdominal aortic aneurysm. Mild  atherosclerotic  calcifications are seen although no focal stenoses are noted.  No other focal abnormality is noted.  Electronically Signed  By: Inez Catalina M.D.  On: 10/22/2013 11:17     Impression:  The patient has recurrent persistent atrial fibrillation which has failed DC cardioversion on 2 occasions, most recently on amiodarone therapy. He remains symptomatic with complaints of exertional shortness of breath, fatigue, and palpitations. Followup echocardiogram demonstrates no significant change in left ventricular function with moderate to severe global systolic dysfunction and ejection fraction estimated 25%. Options include long-term medical therapy with anticoagulation rate control versus an attempt at catheter-based radiofrequency ablation for atrial fibrillation versus surgical maze procedure.  CT angiography of the aorta demonstrates mild atherosclerotic plaque with a small penetrating ulcer but no significant flow limiting disease or other complicating features which might preclude the use of femoral cannulation for surgery.   Plan:  I have again reviewed options at length  with the patient and his niece in the office today. We discussed prognosis with conventional medical therapy as well as the potential long-term benefits of surgical maze procedure including an estimated 75-80% likelihood of freedom from symptomatic atrial fibrillation postoperatively. The patient understands that this would not likely improve his long-term survival, but it may improve him symptomatically to some degree and decrease his long-term risk of stroke.  They understand and accept all potential risks of surgery including but not limited to risk of death, stroke or other neurologic complication, myocardial infarction, congestive heart failure, respiratory failure, renal failure, bleeding requiring transfusion and/or reexploration, bradycardia requiring permanent pacemaker placement, infection or other wound  complications, pneumonia, pleural and/or pericardial effusion, pulmonary embolus, aortic dissection or other major vascular complication, or delayed complications related to maze procedure including but not limited to the late recurrence of atrial fibrillation or atrial flutter.  Specific risks potentially related to the minimally-invasive approach were discussed at length, including but not limited to risk of conversion to full or partial sternotomy, aortic dissection or other major vascular complication, unilateral acute lung injury or pulmonary edema, phrenic nerve dysfunction or paralysis, rib fracture, chronic pain, lung hernia, or lymphocele.  All of their questions have been answered.    I spent in excess of 15 minutes during the conduct of this office consultation and >50% of this time involved direct face-to-face encounter with the patient for counseling and/or coordination of their care.   Valentina Gu. Roxy Manns, MD 10/27/2013 12:58 PM

## 2013-10-27 NOTE — Patient Instructions (Signed)
Nothing to eat or drink after midnight the night before surgery.  On the morning of surgery take only Prilosec and Toprol XL with a sip of water

## 2013-10-28 MED ORDER — METOPROLOL TARTRATE 12.5 MG HALF TABLET: 12.5000 mg | ORAL_TABLET | Freq: Once | ORAL | Status: DC

## 2013-10-28 MED ORDER — NITROGLYCERIN IN D5W 200-5 MCG/ML-% IV SOLN
2.0000 ug/min | INTRAVENOUS | Status: AC
  Administered 2013-10-29: 5 ug/min via INTRAVENOUS
  Filled 2013-10-28: qty 250

## 2013-10-28 MED ORDER — NOREPINEPHRINE BITARTRATE 1 MG/ML IV SOLN
0.0000 ug/min | INTRAVENOUS | Status: DC
  Filled 2013-10-28: qty 8

## 2013-10-28 MED ORDER — DEXTROSE 5 % IV SOLN
1.5000 g | INTRAVENOUS | Status: AC
  Administered 2013-10-29: .75 g via INTRAVENOUS
  Administered 2013-10-29: 1.5 g via INTRAVENOUS
  Filled 2013-10-28: qty 1.5

## 2013-10-28 MED ORDER — SODIUM CHLORIDE 0.9 % IV SOLN
INTRAVENOUS | Status: AC
  Administered 2013-10-29: 1 [IU]/h via INTRAVENOUS
  Filled 2013-10-28: qty 2.5

## 2013-10-28 MED ORDER — POTASSIUM CHLORIDE 2 MEQ/ML IV SOLN
80.0000 meq | INTRAVENOUS | Status: DC
  Filled 2013-10-28: qty 40

## 2013-10-28 MED ORDER — DEXMEDETOMIDINE HCL IN NACL 400 MCG/100ML IV SOLN
0.1000 ug/kg/h | INTRAVENOUS | Status: AC
  Administered 2013-10-29: 0.3 ug/kg/h via INTRAVENOUS
  Filled 2013-10-28: qty 100

## 2013-10-28 MED ORDER — EPINEPHRINE HCL 1 MG/ML IJ SOLN
0.5000 ug/min | INTRAVENOUS | Status: AC
  Administered 2013-10-29: 3 ug/min via INTRAVENOUS
  Filled 2013-10-28: qty 4

## 2013-10-28 MED ORDER — VANCOMYCIN HCL 10 G IV SOLR
1250.0000 mg | INTRAVENOUS | Status: AC
  Administered 2013-10-29: 1250 mg via INTRAVENOUS
  Filled 2013-10-28: qty 1250

## 2013-10-28 MED ORDER — MAGNESIUM SULFATE 50 % IJ SOLN
40.0000 meq | INTRAMUSCULAR | Status: DC
  Filled 2013-10-28: qty 10

## 2013-10-28 MED ORDER — DOPAMINE-DEXTROSE 3.2-5 MG/ML-% IV SOLN
2.0000 ug/kg/min | INTRAVENOUS | Status: AC
  Administered 2013-10-29: 3 ug/kg/min via INTRAVENOUS
  Filled 2013-10-28: qty 250

## 2013-10-28 MED ORDER — SODIUM CHLORIDE 0.9 % IV SOLN
INTRAVENOUS | Status: DC
  Filled 2013-10-28: qty 30

## 2013-10-28 MED ORDER — PHENYLEPHRINE HCL 10 MG/ML IJ SOLN
30.0000 ug/min | INTRAMUSCULAR | Status: DC
  Filled 2013-10-28: qty 2

## 2013-10-28 NOTE — H&P (Addendum)
[address]       [place] [address]             [phone]          CARDIOTHORACIC SURGERY HISTORY AND PHYSICAL EXAM  Referring Provider is Thompson Grayer, MD Primary Cardiologist is Stanford Breed, Denice Bors, MD PCP is Verline Lema, MD    Chief Complaint   Patient presents with   .  Atrial Fibrillation       Surgical eval for possible MAZE procedure     HPI:  Patient is a 73 year old retired white male from [employer] with history of hypertension, nonischemic cardiomyopathy, chronic combined systolic and diastolic congestive heart failure, obesity who is been referred for possible surgical treatment of recurrent persistent atrial fibrillation. The patient states that he first began to experience symptoms of exertional shortness of breath some time last year. He reportedly had some pulmonary function tests performed at the [facility] medical clinic in [place] last fall, and subsequently symptoms were attributed to seasonal allergies. Beginning this spring the patient began to experience significant progression of symptoms of exertional shortness of breath. Ultimately he was hospitalized in April when he was diagnosed with rapid atrial fibrillation and acute exacerbation of chronic combined systolic and diastolic congestive heart failure.  Transthoracic echocardiogram performed at that time demonstrated ejection fraction 20-25%. Diagnostic cardiac catheterization was performed notable for moderate nonobstructive coronary artery disease. The patient's nonischemic cardiomyopathy was felt most likely to be related to tachycardia. He underwent DC cardioversion during that admission but promptly returned into atrial fibrillation. He was loaded with amiodarone and anticoagulated using Eliquis.  Since hospital discharge she has been seen in followup on several occasions by Dr. Stanford Breed, and he underwent repeat DC cardioversion 07/31/2013.  He initially did well and remained in sinus  rhythm when he was seen in followup on June 17. However, the patient subsequently has developed recurrent persistent atrial fibrillation. He was referred to Dr. Rayann Heman to consider EP studay and radiofrequency Afib ablation, Please felt to be relatively poor candidate because of enlarged left atrial size and other comorbid medical problems. The patient was referred for surgical consultation to consider elective Maze procedure.  He was originally seen in consultation on 09/24/2013 and he was seen more recently on 10/13/2013 at which time we made plans to proceed with maze procedure later this week.  Since then he has been seen in followup by Dr. Rayann Heman who adjusted his diuretic dose up a bit. Since then he has continued to do well, and he states that his breathing has improved with increased dose of diuretic. He reports no new problems or complaints and he is looking forward to proceeding with surgery as previously planned.  He stopped taking Xarelto last week in anticipation of surgery.  He is accompanied to the office today by his niece who also has been made his medical power of attorney.  The patient is a widower and lives alone in [place]. 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 He has remained physically active all of his life. He enjoys doing yard work and has several other hobbies. He complains of recent progression of symptoms of dyspnea with exertion and progressive fatigue.  He states that this really began to get worse in early April. He reports that he does feel better currently than he did at that time he was hospitalized in  April, but he still complains of shortness of breath with moderately strenuous exertion. This does not limit his ordinary day-to-day activities around the house, but it limits his lifestyle considerably. He has some palpitations. He has not had any chest discomfort either with activity or  at rest. He denies any history of resting shortness of breath, PND, orthopnea, or lower extremity edema.       Past Medical History  Diagnosis Date  . Hypertension   . Obesity   . GERD (gastroesophageal reflux disease)   . Chronic combined systolic and diastolic heart failure 03/17/1476    a.  Echo (06/25/13):  EF 20-25%, diff HK, restrictive physio, mild MR, mod to severe LAE, mild RVE, mildly reduced RVSF, mod to severe RAE  . NICM (nonischemic cardiomyopathy) with EF 20-25% 06/26/2013  . Atrial Fibrillation  06/25/2013  . Dyslipidemia 07/15/2013  . CAD (coronary artery disease), native coronary artery 06/28/2013    a. LHC (06/27/13):  Mid LAD 40%, mid D1 75%, mid CFX 50-60%, mid RCA 50%, EF 20%, LVEDP 24 mmHg.  Marland Kitchen Penetrating atherosclerotic ulcer of aorta 10/22/2013    Small penetrating ulcer of descending thoracic aorta discovered on routine CTA  . Shortness of breath     seen by Crenshaw, & low energy   . CHF (congestive heart failure)   . Dysrhythmia     a-fib  . Anxiety   . Arthritis     R hand- OA  . Prostate cancer     watchful waiting, no treatment so far    Past Surgical History  Procedure Laterality Date  . Medial partial knee replacement Left 1990's?  . Transurethral resection of prostate  2010  . Tee without cardioversion N/A 06/30/2013    Procedure: TRANSESOPHAGEAL ECHOCARDIOGRAM (TEE) ;  Surgeon: [name], MD;  Location: [facility];  Service: Cardiovascular;  Laterality: N/A;  . Cardioversion N/A 06/30/2013    Procedure: CARDIOVERSION;  Surgeon: [name], MD;  Location: [facility];  Service: Cardiovascular;  Laterality: N/A;  . Cardioversion N/A 07/31/2013    Procedure: CARDIOVERSION;  Surgeon: Thayer Headings, MD;  Location: [facility] ENDOSCOPY;  Service: Cardiovascular;  Laterality: N/A;  . Cardiac catheterization  06/2013    No family history on file.  Social History History  Substance Use Topics  . Smoking status: Never Smoker   . Smokeless tobacco: Never  Used     Comment: 06/25/2013 "smoked very light; years and years ago"  . Alcohol Use: 8.4 oz/week    7 Glasses of wine, 7 Shots of liquor per week    Prior to Admission medications   Medication Sig Start Date End Date Taking? Authorizing Provider  apixaban (ELIQUIS) 5 MG TABS tablet Take 1 tablet (5 mg total) by mouth 2 (two) times daily. 07/01/13  Yes Belva Crome III, MD  Cholecalciferol (VITAMIN D) 2000 UNITS tablet Take 2,000 Units by mouth daily.   Yes Historical Provider, MD  lisinopril (PRINIVIL,ZESTRIL) 10 MG tablet Take 5 mg by mouth daily with breakfast.    Yes Historical Provider, MD  Melatonin 10 MG CAPS Take 10 mg by mouth at bedtime as needed (sleep).   Yes Historical Provider, MD  omeprazole (PRILOSEC) 20 MG capsule Take 20 mg by mouth daily.   Yes Historical Provider, MD  potassium chloride SA (K-DUR,KLOR-CON) 20 MEQ tablet Take 1 tablet (20 mEq total) by mouth daily. 07/01/13  Yes Belva Crome III, MD  temazepam (RESTORIL) 15 MG capsule Take 15 mg by mouth  at bedtime as needed.    Yes Historical Provider, MD  amiodarone (PACERONE) 200 MG tablet Take 200 mg by mouth daily with supper. 07/22/13   Lelon Perla, MD  atorvastatin (LIPITOR) 20 MG tablet Take 20 mg by mouth daily with breakfast. 07/15/13   Liliane Shi, PA-C  furosemide (LASIX) 40 MG tablet Take 40 mg by mouth daily with breakfast. If swelling in wrist takes an extra dose 07/01/13   Belva Crome III, MD  metoprolol succinate (TOPROL-XL) 50 MG 24 hr tablet Take 25 mg by mouth 2 (two) times daily with breakfast and lunch. 09/22/13   Thompson Grayer, MD    No Known Allergies    Review of Systems:              General:                      normal appetite, decreased energy, no weight gain, no weight loss, no fever             Cardiac:                      no chest pain with exertion, no chest pain at rest, + SOB with moderate exertion, no resting SOB, no PND, no orthopnea, + palpitations, + arrhythmia, + atrial  fibrillation, no LE edema, no dizzy spells, no syncope             Respiratory:                + exertional shortness of breath, no home oxygen, no productive cough, no dry cough, no bronchitis, no wheezing, no hemoptysis, no asthma, no pain with inspiration or cough, no sleep apnea, no CPAP at night             GI:                                no difficulty swallowing, no reflux, no frequent heartburn, no hiatal hernia, no abdominal pain, no constipation, no diarrhea, no hematochezia, no hematemesis, no melena             GU:                              no dysuria,  no frequency, no urinary tract infection, no hematuria, no enlarged prostate, no kidney stones, no kidney disease             Vascular:                     no pain suggestive of claudication, no pain in feet, no leg cramps, no varicose veins, no DVT, no non-healing foot ulcer             Neuro:                         no stroke, no TIA's, no seizures, no headaches, no temporary blindness one eye,  no slurred speech, no peripheral neuropathy, no chronic pain, no instability of gait, no memory/cognitive dysfunction             Musculoskeletal:         Very mild arthritis in right hand, no joint swelling, no myalgias, no difficulty walking, normal mobility  Skin:                            no rash, no itching, no skin infections, no pressure sores or ulcerations             Psych:                         no anxiety, no depression, no nervousness, no unusual recent stress             Eyes:                           no blurry vision, no floaters, no recent vision changes, does not wear glasses or contacts             ENT:                            no hearing loss, no loose or painful teeth, + upper dentures, last saw dentist several years ago             Hematologic:                easy bruising, no abnormal bleeding, no clotting disorder, no frequent epistaxis             Endocrine:                   no diabetes, does not check  CBG's at home                           Physical Exam:              BP 124/75  Pulse 60  Resp 20  Ht 6\' 2"  (1.88 m)  Wt 105.688 kg (233 lb)  BMI 29.90 kg/m2  SpO2 98%             General:                      Moderately obese but o/w well-appearing             HEENT:                       Unremarkable               Neck:                           no JVD, no bruits, no adenopathy               Chest:                         clear to auscultation, symmetrical breath sounds, no wheezes, no rhonchi               CV:                              Irregular rate and rhythm, no murmur               Abdomen:  soft, non-tender, no masses               Extremities:                 warm, well-perfused, pulses palpable, no LE edema             Rectal/GU                   Deferred             Neuro:                         Grossly non-focal and symmetrical throughout             Skin:                            Clean and dry, no rashes, no breakdown   Diagnostic Tests:  Transthoracic Echocardiography  Patient: Dametri, Ozburn MR #: [id number] Study Date: 06/25/2013 Gender: M Age: 32 Height: 188cm Weight: 112.3kg BSA: 2.97m^2 Pt. Status: Room: 2W05C  SONOGRAPHER Leavy Cella Areta Haber PERFORMING [facility], Inpatient cc:  ------------------------------------------------------------ LV EF: 20% - 25%  ------------------------------------------------------------ Indications: Atrial flutter 427.32.  ------------------------------------------------------------ History: PMH: Atrial fibrillation. Atrial flutter. Angina pectoris. Risk factors: Hypertension.  ------------------------------------------------------------ Study Conclusions  - Left ventricle: The cavity size was normal. Wall thickness was normal. Systolic function was severely reduced. The estimated ejection fraction was in the range of 20% to 25%. Diffuse hypokinesis. Doppler parameters  are consistent with restrictive physiology, indicative of decreased left ventricular diastolic compliance and/or increased left atrial pressure. - Mitral valve: Mild regurgitation. - Left atrium: The atrium was moderately to severely dilated. - Right ventricle: The cavity size was mildly dilated. Systolic function was mildly reduced. - Right atrium: The atrium was moderately to severely dilated. Echocardiography. M-mode, complete 2D, spectral Doppler, and color Doppler. Height: Height: 188cm. Height: 74in. Weight: Weight: 112.3kg. Weight: 247lb. Body mass index: BMI: 31.8kg/m^2. Body surface area: BSA: 2.88m^2. Blood pressure: 126/67. Patient status: Inpatient.  ------------------------------------------------------------  ------------------------------------------------------------ Left ventricle: The cavity size was normal. Wall thickness was normal. Systolic function was severely reduced. The estimated ejection fraction was in the range of 20% to 25%. Diffuse hypokinesis. Doppler parameters are consistent with restrictive physiology, indicative of decreased left ventricular diastolic compliance and/or increased left atrial pressure.  ------------------------------------------------------------ Aortic valve: Structurally normal valve. Cusp separation was normal. Doppler: Transvalvular velocity was within the normal range. There was no stenosis. No regurgitation.  ------------------------------------------------------------ Aorta: Aortic root: The aortic root was normal in size. Ascending aorta: The ascending aorta was mildly dilated.  ------------------------------------------------------------ Mitral valve: Structurally normal valve. Leaflet separation was normal. Doppler: Transvalvular velocity was within the normal range. There was no evidence for stenosis. Mild regurgitation. Peak gradient: 29mm Hg  (D).  ------------------------------------------------------------ Left atrium: The atrium was moderately to severely dilated.  ------------------------------------------------------------ Right ventricle: The cavity size was mildly dilated. Systolic function was mildly reduced.  ------------------------------------------------------------ Tricuspid valve: Structurally normal valve. Leaflet separation was normal. Doppler: Transvalvular velocity was within the normal range. No regurgitation.  ------------------------------------------------------------ Right atrium: The atrium was moderately to severely dilated.  ------------------------------------------------------------ Pericardium: There was no pericardial effusion.  ------------------------------------------------------------ Systemic veins: Inferior vena cava: The vessel was dilated; the respirophasic diameter changes were blunted (< 50%); findings are consistent with elevated central venous pressure.  ------------------------------------------------------------  2D measurements Normal Doppler measurements Normal Left ventricle Mitral valve LVID ED,  46.6 mm 43-52 Peak E vel 102 cm/s ------ chord, Deceleration 130 ms 150-23 PLAX time 0 LVID ES, 37.4 mm 23-38 Peak 4 mm ------ chord, gradient, D Hg PLAX FS, chord, 20 % >29 PLAX LVPW, ED 12 mm ------ IVS/LVPW 0.9 <1.3 ratio, ED Ventricular septum IVS, ED 10.8 mm ------ Aorta Root diam, 38 mm ------ ED Left atrium AP dim 48 mm ------ AP dim 1.96 cm/m^2 <2.2 index  ------------------------------------------------------------ Prepared and Electronically Authenticated by  [employer], Mihai 2015-04-29T18:02:[id number]    Transesophageal Echocardiography  Patient: Oswin, Griffith MR #: [id number] Study Date: 06/30/2013 Gender: M Age: 62 Height: 188cm Weight: 110kg BSA: 2.49m^2 Pt. Status: Room: 7E93Y  Alyse Low, MD PERFORMING Fransico Him,  MD REFERRING Fransico Him, MD SONOGRAPHER Florentina Jenny, RDCS ADMITTING Lauree Chandler ATTENDING Lauree Chandler cc:  ------------------------------------------------------------ LV EF: 20% - 25%  ------------------------------------------------------------ Indications: Atrial fibrillation - 427.31.  ------------------------------------------------------------ Study Conclusions  - Left ventricle: Systolic function was severely reduced. The estimated ejection fraction was in the range of 20% to 25%. Diffuse hypokinesis. - Aortic valve: No evidence of vegetation. Trivial regurgitation. - Mitral valve: No evidence of vegetation. Mild regurgitation. - Left atrium: The atrium was severely dilated. No evidence of thrombus in the atrial cavity or appendage. There was moderatecontinuous spontaneous echo contrast ("smoke") in the cavity. The appendage was well visualized, morphologically a left appendage, and of normal size. Emptying velocity was reduced. - Right ventricle: The cavity size was mildly dilated. Wall thickness was normal. - Right atrium: The atrium was severely dilated. No evidence of thrombus in the atrial cavity or appendage. There was severecontinuous spontaneous echo contrast ("smoke") in the cavity. - Atrial septum: No defect or patent foramen ovale was identified. - Tricuspid valve: No evidence of vegetation. - Pulmonic valve: No evidence of vegetation. Transesophageal echocardiography. 2D and color Doppler. Height: Height: 188cm. Height: 74in. Weight: Weight: 110kg. Weight: 242lb. Body mass index: BMI: 31.1kg/m^2. Body surface area: BSA: 2.26m^2. Blood pressure: 132/87. Patient status: Inpatient. Location: Endoscopy.  ------------------------------------------------------------  ------------------------------------------------------------ Left ventricle: Systolic function was severely reduced. The estimated ejection fraction was in the range  of 20% to 25%. Diffuse hypokinesis.  ------------------------------------------------------------ Aortic valve: Structurally normal valve. Trileaflet; normal thickness leaflets. Cusp separation was normal. No evidence of vegetation. Doppler: Trivial regurgitation.  ------------------------------------------------------------ Aorta: The aorta was normal, not dilated, and non-diseased. There was no atheroma. There was no evidence for dissection. Aortic root: The aortic root was not dilated. Ascending aorta: The ascending aorta was normal in size. Aortic arch: The aortic arch was normal in size. Descending aorta: The descending aorta was normal in size.  ------------------------------------------------------------ Mitral valve: Structurally normal valve. Leaflet separation was normal. No evidence of vegetation. Doppler: Mild regurgitation.  ------------------------------------------------------------ Left atrium: The atrium was severely dilated. No evidence of thrombus in the atrial cavity or appendage. There was moderatecontinuous spontaneous echo contrast ("smoke") in the cavity. The appendage was well visualized, morphologically a left appendage, and of normal size. Emptying velocity was reduced.  ------------------------------------------------------------ Atrial septum: No defect or patent foramen ovale was identified.  ------------------------------------------------------------ Right ventricle: Poorly visualized. The cavity size was mildly dilated. Wall thickness was normal. Systolic function was normal.  ------------------------------------------------------------ Pulmonic valve: Structurally normal valve. Cusp separation was normal. No evidence of vegetation.  ------------------------------------------------------------ Tricuspid valve: Poorly visualized. Structurally normal valve. Leaflet separation was normal. No evidence of vegetation. Doppler: No  regurgitation.  ------------------------------------------------------------ Pulmonary artery: The main pulmonary artery was normal-sized.  ------------------------------------------------------------ Right atrium: The atrium was severely dilated. No evidence  of thrombus in the atrial cavity or appendage. There was severecontinuous spontaneous echo contrast ("smoke") in the cavity. The appendage was morphologically a right appendage.  ------------------------------------------------------------ Pericardium: The pericardium was normal in appearance. There was no pericardial effusion.  ------------------------------------------------------------ Post procedure conclusions Ascending Aorta:  - The aorta was normal, not dilated, and non-diseased.  ------------------------------------------------------------ Prepared and Electronically Authenticated by  Fransico Him, MD 2015-05-04T17:16:20     CARDIAC CATHETERIZATION  PROCEDURE: Left heart catheterization with selective coronary angiography, right heart catheterization.   INDICATIONS: CHF   The risks, benefits, and details of the procedure were explained to the patient. The patient verbalized understanding and wanted to proceed. Informed written consent was obtained.   PROCEDURE TECHNIQUE: After Xylocaine anesthesia a 66F slender sheath was placed in the right radial artery with a single anterior needle wall stick. Right coronary angiography was done using a Judkins R4 guide catheter. Left coronary angiography was done using a Judkins L3.5 guide catheter. Left ventriculography was done using a pigtail catheter. A TR band was used for hemostasis.   CONTRAST: Total of 50 cc.   COMPLICATIONS: None.   HEMODYNAMICS: Aortic pressure was 108/73; LV pressure was 107/10; LVEDP 24. There was no gradient between the left ventricle and aorta. Right atrial pressure 16/17, mean gradient or pressure 15 mmHg. RV pressure 35/12, RVEDP 15 mmHg. PA  pressure 39/24, mean PA pressure 29 mmHg. Pulmonic capillary wedge pressure 25/26, mean pulmonary capillary wedge pressure 24 mmHg. Pulmonary artery saturation, 62%. Aortic saturation 91%. Cardiac output 5.5 L per minute. Cardiac index 2.3.   ANGIOGRAPHIC DATA: The left main coronary artery is widely patent.  The left anterior descending artery is a medium size vessel which does reach the apex. There is mild to moderate diffuse disease throughout the vessel. In the mid vessel, there is a focal 40% stenosis. There is a large first diagonal. There is a focal 75% stenosis in the midportion of this diagonal.   The left circumflex artery is a large dominant vessel. In the mid vessel, there is a 50-60% stenosis. The first obtuse marginal is small. Is patent. Second obtuse marginal is medium size and appears widely patent. The third obtuse marginal is patent as well.   The right coronary artery is medium sized proximally. It is a nondominant vessel . There is a 50% lesion in the mid vessel. The distal vessel is very small.   LEFT VENTRICULOGRAM: Left ventricular angiogram was not done due to renal insufficiency. By echocardiogram, ejection fraction of 20%. LVEDP was 24 mmHg.   IMPRESSIONS:   1. Normal left main coronary artery. 2. Mild to moderate disease in the left anterior descending artery and its branches. There is a 75% first diagonal lesion.   3. Mild to moderate disease in the left circumflex artery and its branches. 4. Mild disease in the nondominant right coronary artery. 5. LVEDP 24 mmHg. Ejection fraction 20% by echocardiogram. 6. Mean pulmonary capillary wedge pressure 24 mmHg. Pulmonary artery saturation, 62%. Aortic saturation 91%. Cardiac output 5.5 L per minute. Cardiac index 2.3.   RECOMMENDATION: Cardiomyopathy is well out of proportion to his degree of coronary artery disease. No revascularization is indicated at this time. Would reassess him clinically for ischemia after his LV function  improves with medical therapy. Continue medical therapy for her LV systolic dysfunction. Will start anticoagulation at about 8:00 tonight for his atrial fibrillation. Per Dr. Ellyn Hack, plan will be for cardioversion on Monday. Long-term followup with Dr. Angelena Form.     Transthoracic  Echocardiography  Patient: Cahlil, Sattar MR #: [id number] Study Date: 10/08/2013 Gender: M Age: 55 Height: 188 cm Weight: 104.3 kg BSA: 2.35 m^2 Pt. Status: Room:  ATTENDING Jenkins Rouge, M.D. Morrill Crenshaw SONOGRAPHER Cindy Hazy, RDCS PERFORMING [facility], Outpatient  cc:  ------------------------------------------------------------------- LV EF: 25%  ------------------------------------------------------------------- Indications: Atrial fibrillation - 427.31.  ------------------------------------------------------------------- History: PMH: Chronic Atrial Fibrillaiton. Nonischemic Cardiomyopathy. Chest Pain. History of Prostate Cancer. Risk factors: Hypertension. Dyslipidemia.  ------------------------------------------------------------------- Study Conclusions  - Left ventricle: The cavity size was moderately dilated. The estimated ejection fraction was 25%. Diffuse hypokinesis. - Mitral valve: There was mild regurgitation. - Left atrium: The atrium was severely dilated. - Atrial septum: No defect or patent foramen ovale was identified.  Transthoracic echocardiography. M-mode, complete 2D, spectral Doppler, and color Doppler. Birthdate: Patient birthdate: 07-20-1940. Age: Patient is 73 yr old. Sex: Gender: male. BMI: 29.5 kg/m^2. Blood pressure: 126/82 Patient status: Outpatient. Study date: Study date: 10/08/2013. Study time: 09:42 AM. Location: [facility]  -------------------------------------------------------------------  ------------------------------------------------------------------- Left ventricle: The cavity size was moderately  dilated. The estimated ejection fraction was 25%. Diffuse hypokinesis.  ------------------------------------------------------------------- Aortic valve: Mildly calcified leaflets. Doppler: There was no stenosis.  ------------------------------------------------------------------- Aorta: The aorta was normal, not dilated, and non-diseased.  ------------------------------------------------------------------- Mitral valve: Doppler: There was mild regurgitation. Peak gradient (D): 6 mm Hg.  ------------------------------------------------------------------- Left atrium: The atrium was severely dilated.  ------------------------------------------------------------------- Atrial septum: No defect or patent foramen ovale was identified.  ------------------------------------------------------------------- Right ventricle: The cavity size was normal. Wall thickness was normal. Systolic function was normal.  ------------------------------------------------------------------- Pulmonic valve: Structurally normal valve. Cusp separation was normal. Doppler: Transvalvular velocity was within the normal range. There was trivial regurgitation.  ------------------------------------------------------------------- Tricuspid valve: Doppler: There was trivial regurgitation.  ------------------------------------------------------------------- Right atrium: The atrium was normal in size.  ------------------------------------------------------------------- Pericardium: The pericardium was normal in appearance.  ------------------------------------------------------------------- Post procedure conclusions Ascending Aorta:  - The aorta was normal, not dilated, and non-diseased.  ------------------------------------------------------------------- Measurements  Left ventricle Value Reference LV ID, ED, PLAX chordal 52 mm 43 - 52 LV ID, ES, PLAX chordal 38 mm 23 - 38 LV fx shortening, PLAX  chordal (L) 27 % >=29 LV PW thickness, ED 10 mm --------- IVS/LV PW ratio, ED 1 <=1.3 Stroke volume, 2D 43 ml --------- Stroke volume/bsa, 2D 18 ml/m^2 ---------  Ventricular septum Value Reference IVS thickness, ED 10 mm ---------  LVOT Value Reference LVOT ID, S 23 mm --------- LVOT area 4.15 cm^2 --------- LVOT VTI, S 10.3 cm ---------  Aorta Value Reference Aortic root ID, ED 37 mm ---------  Left atrium Value Reference LA ID, A-P, ES 51 mm --------- LA ID/bsa, A-P 2.17 cm/m^2 <=2.2  Mitral valve Value Reference Mitral E-wave peak velocity 126 cm/s --------- Mitral deceleration time (L) 120 ms 150 - 230 Mitral peak gradient, D 6 mm Hg ---------  Tricuspid valve Value Reference Tricuspid regurg peak velocity 215 cm/s --------- Tricuspid peak RV-RA gradient 18 mm Hg --------- Tricuspid maximal regurg velocity, 215 cm/s --------- PISA  Right ventricle Value Reference RV s&', lateral, S 10 cm/s ---------  Legend: (L) and (H) mark values outside specified reference range.  ------------------------------------------------------------------- Prepared and Electronically Authenticated by  Jenkins Rouge, M.D. 2015-08-12T12:32:52   CT ANGIOGRAPHY CHEST, ABDOMEN AND PELVIS   TECHNIQUE:   Multidetector CT imaging through the chest, abdomen and pelvis was   performed using the standard protocol during bolus administration of   intravenous contrast. Multiplanar reconstructed images and MIPs were  obtained and reviewed to evaluate the vascular anatomy.   CONTRAST: 53mL OMNIPAQUE IOHEXOL 350 MG/ML SOLN   COMPARISON: None.   FINDINGS:   CTA CHEST FINDINGS   The lungs are well aerated bilaterally and demonstrate emphysematous   changes in both lungs particularly in the apices. A small   right-sided pleural effusion is noted. No focal infiltrate or   pneumothorax is seen.   The thoracic inlet is within normal limits. The origins of the   brachiocephalic vessels are  unremarkable. A truncus anomaly is   noted. Mild aortic calcifications are noted. No evidence of   aneurysmal dilatation is seen. Some irregular aortic plaque is noted   in the descending aorta. A small area of intimal irregularity is   noted in the descending aorta (image 85 of series 4) which may   represent a small penetrating ulcer or focal dissection. This   measures approximately 9 mm in greatest dimension. No significant   hilar or mediastinal adenopathy is noted. The pulmonary artery as   visualized is within normal limits.   The left atrium is well visualized without filling defect. A left   atrial appendage is noted. Normal superior and inferior pulmonary   venous anatomy is noted bilaterally. The left main coronary artery   courses mildly posteriorly prior to its more normal course behind   the main pulmonary outflow trunk. Moderate coronary calcifications   are noted particularly in the left anterior descending coronary   artery. Degenerative changes of the thoracic spine are seen.   Review of the MIP images confirms the above findings.   CTA ABDOMEN AND PELVIS FINDINGS   Vascular: The abdominal aorta demonstrates diffuse mild   calcification without evidence of aneurysmal dilatation. Mild   calcifications are noted in the iliac vessels although no focal   aneurysmal dilatation or focal areas of stenosis are identified. The   inferior mesenteric artery, superior mesenteric artery and celiac   axis are widely patent. Dual renal arteries are noted on the right.   Three renal arteries are noted on the left.   Nonvascular:   The liver, spleen, adrenal glands, gallbladder and pancreas are   within normal limits. A few small accessory splenules are noted. The   kidneys demonstrate a normal enhancement pattern bilaterally. No   renal calculi or urinary tract obstructive changes are seen. Mild   diverticular change of the colon is noted. The appendix is well   visualized and within  normal limits. The bladder is incompletely   distended. No prostatic enlargement is seen. No pelvic sidewall mass   or adenopathy is seen.   Review of the MIP images confirms the above findings.   IMPRESSION:   Irregular atherosclerotic plaque in the descending thoracic aorta   with an area of irregularity which may represent a small focal   dissection or penetrating ulcer.   No thoracic aortic aneurysm is identified. Normal pulmonary venous   anatomy is noted.   Multiple renal arteries bilaterally. The visceral vessels are   otherwise within normal limits.   No evidence of abdominal aortic aneurysm. Mild atherosclerotic   calcifications are seen although no focal stenoses are noted.   No other focal abnormality is noted.   Electronically Signed   By: Inez Catalina M.D.   On: 10/22/2013 11:17      Pulmonary Function Tests  Baseline  Post-bronchodilator  FVC                 3.30 L  (65% predicted)          FVC                 3.39 L  (67% predicted) FEV1               2.74 L  (74% predicted)          FEV1               2.72 L  (73% predicted) FEF25-75        3.32 L  (121% predicted)        FEF25-75        3.05 L  (111% predicted)  TLC                 5.56 L  (70% predicted) RV                   1.98 L  (72% predicted) DLCO              46% predicted     Impression:  The patient has recurrent persistent atrial fibrillation which has failed DC cardioversion on 2 occasions, most recently on amiodarone therapy. He remains symptomatic with complaints of exertional shortness of breath, fatigue, and palpitations. At the time of his original presentation in April transthoracic echocardiogram demonstrated significant left ventricular systolic dysfunction with ejection fraction estimated 20-25%. Catheterization was notable for moderate nonobstructive coronary artery disease, and the patient's cardiomyopathy has been  attributed to tachycardia.  Followup echocardiogram performed recently demonstrates no significant change in left ventricular function with moderate to severe global systolic dysfunction and ejection fraction estimated 25%.  Although the patient reports feeling better than he did at that time he was hospitalized last April, he continues to complain of significant exertional shortness of breath and fatigue.  Options include long-term medical therapy with anticoagulation and rate control versus an attempt at catheter-based radiofrequency ablation for atrial fibrillation versus surgical maze procedure.  I have personally reviewed the patient's transthoracic and transesophageal echocardiograms, as well as his diagnostic cardiac catheterization. I agree that the long-term success of an attempt at catheter-based radiofrequency ablation for atrial fibrillation might be somewhat attenuated by the presence of significant left atrial enlargement.  Other options include long-term medical therapy with anticoagulation and rate control versus surgical maze procedure.  CT angiography of the aorta demonstrates mild atherosclerotic plaque with a small penetrating ulcer but no significant flow limiting disease nor other complicating features which might preclude the use of femoral cannulation for surgery.    Plan:  I have again reviewed options at length with the patient and his niece in the office today. We discussed prognosis with conventional medical therapy as well as the potential long-term benefits of surgical maze procedure including an estimated 75-80% likelihood of freedom from symptomatic atrial fibrillation postoperatively. The patient understands that this would not likely improve his long-term survival, but it may improve him symptomatically to some degree and decrease his long-term risk of stroke.  They understand and accept all potential risks of surgery including but not limited to risk of death, stroke or other  neurologic complication, myocardial infarction, congestive heart failure, respiratory failure, renal failure, bleeding requiring transfusion and/or reexploration, bradycardia requiring permanent pacemaker placement, infection or other wound complications, pneumonia, pleural and/or pericardial effusion, pulmonary embolus, aortic dissection or other major vascular complication, or  delayed complications related to maze procedure including but not limited to the late recurrence of atrial fibrillation or atrial flutter.  Specific risks potentially related to the minimally-invasive approach were discussed at length, including but not limited to risk of conversion to full or partial sternotomy, aortic dissection or other major vascular complication, unilateral acute lung injury or pulmonary edema, phrenic nerve dysfunction or paralysis, rib fracture, chronic pain, lung hernia, or lymphocele.  All of their questions have been answered.    Valentina Gu. Roxy Manns, MD 10/27/2013 12:58 PM

## 2013-10-28 NOTE — Progress Notes (Signed)
Anesthesia Chart Review: Patient is a 73 year old male scheduled for minimally invasive Maze procedure on 10/29/13 by Dr. Roxy Manns.  History includes persistent afib s/p cardioversion 07/31/13 and 06/30/13, non-smoker, HTN, chronic combined CHF, NICM, dyslipidemia, CAD (moderate non-obstructive by 06/2013 cath), SOB, anxiety, arthritis, prostate cancer, TURP.  09/2013 CTA showed an irregular atherosclerotic plaque in the descending thoracic aorta with an area of irregularity which may represent a small focal dissection or penetrating ulcer. Labs over the past few months suggest he also has a degree of chronic renal insufficiency. BMI is consistent with mild obesity. Primary cardiologist is Dr. Kirk Ruths.  EP cardiologist is Dr. Thompson Grayer.  PCP is Dr. Dell Ponto.  Notes indicate that Dr. Roxy Manns asked patient to hold Eliquis for one week prior to surgery.  EKG on 10/19/13 showed: a-flutter, v rate of 94 bpm, RAD. HR was 106 bpm at PAT.  It was not rechecked.  He is on amiodarone and Toprol which he has been instructed to take on the morning of surgery.  Echo on 10/08/13 showed:  - Left ventricle: The cavity size was moderately dilated. The estimated ejection fraction was 25%. Diffuse hypokinesis. - Mitral valve: There was mild regurgitation. - Left atrium: The atrium was severely dilated. - Atrial septum: No defect or patent foramen ovale was identified.  Cardiac cath on 06/27/13 showed:  1. Normal left main coronary artery. 2. Mild to moderate disease in the left anterior descending artery and its branches. 40% mid LAD. There is a 75% first diagonal lesion.  3. Mild to moderate disease in the left circumflex artery and its branches. 50-60% mid CX.  4. Mild disease in the nondominant right coronary artery. 50% mid RCA.  5. LVEDP 24 mmHg. Ejection fraction 20% by echocardiogram. 6. Mean pulmonary capillary wedge pressure 24 mmHg. Pulmonary artery saturation, 62%. Aortic saturation 91%. Cardiac output 5.5 L  per minute. Cardiac index 2.3. RECOMMENDATION: Cardiomyopathy is well out of proportion to his degree of coronary artery disease. No revascularization is indicated at this time. Would reassess him clinically for ischemia after his LV function improves with medical therapy. Continue medical therapy for her LV systolic dysfunction.   Carotid duplex on 10/27/13 showed: Findings suggest 1-39% internal carotid artery stenosis bilaterally. Vertebral arteries are patent with antegrade flow.  CXR on 10/27/13 showed: No active cardiopulmonary disease.  Chest CTA on 10/22/13 showed: IMPRESSION: Irregular atherosclerotic plaque in the descending thoracic aorta with an area of irregularity which may represent a small focal dissection or penetrating ulcer. No thoracic aortic aneurysm is identified. Normal pulmonary venous anatomy is noted.  Multiple renal arteries bilaterally. The visceral vessels are otherwise within normal limits. No evidence of abdominal aortic aneurysm. Mild atherosclerotic calcifications are seen although no focal stenoses are noted. No other focal abnormality is noted.  Pulmonary Function Tests 10/08/2013 Baseline Post-bronchodilator  FVC 3.30 L (65% predicted) FVC 3.39 L (67% predicted)  FEV1 2.74 L (74% predicted) FEV1 2.72 L (73% predicted)  FEF25-75 3.32 L (121% predicted) FEF25-75 3.05 L (111% predicted)  TLC 5.56 L (70% predicted)  RV 1.98 L (72% predicted)  DLCO 46% predicted  Preoperative PFTs and labs noted.  Cr 1.64 (previously 1.34 -1.7 since 06/27/13), so appear to be within his baseline. Labs are marked as reviewed in Epic by Dr. Roxy Manns.  Further evaluation by Dr. Roxy Manns and his assigned anesthesiologist on the day of surgery.  If no acute changes then I would anticipate that he could proceed.  Dr. Roxy Manns can  follow renal function post-opereratively.  George Hugh [facility] Stay [facility]/Anesthesiology Phone [phone] 10/28/2013 1:28 PM

## 2013-10-29 ENCOUNTER — Encounter (HOSPITAL_COMMUNITY)
Admission: RE | Disposition: A | Discharge status: Skilled Nursing Facility | Payer: Medicare Other | Source: Ambulatory Visit | Attending: Thoracic Surgery (Cardiothoracic Vascular Surgery)

## 2013-10-29 ENCOUNTER — Inpatient Hospital Stay (HOSPITAL_COMMUNITY): Payer: Medicare Other | Admitting: Anesthesiology

## 2013-10-29 ENCOUNTER — Encounter (HOSPITAL_COMMUNITY): Payer: Medicare Other | Admitting: Anesthesiology

## 2013-10-29 ENCOUNTER — Encounter (HOSPITAL_COMMUNITY): Payer: Self-pay | Admitting: Anesthesiology

## 2013-10-29 ENCOUNTER — Inpatient Hospital Stay (HOSPITAL_COMMUNITY)
Admission: RE | Admit: 2013-10-29 | Discharge: 2013-11-04 | DRG: 229 | Disposition: A | Discharge status: Skilled Nursing Facility | Payer: Medicare Other | Source: Ambulatory Visit | Attending: Thoracic Surgery (Cardiothoracic Vascular Surgery) | Admitting: Thoracic Surgery (Cardiothoracic Vascular Surgery)

## 2013-10-29 ENCOUNTER — Inpatient Hospital Stay (HOSPITAL_COMMUNITY): Payer: Medicare Other

## 2013-10-29 DIAGNOSIS — J9819 Other pulmonary collapse: Secondary | ICD-10-CM | POA: Diagnosis not present

## 2013-10-29 DIAGNOSIS — F1010 Alcohol abuse, uncomplicated: Secondary | ICD-10-CM | POA: Diagnosis present

## 2013-10-29 DIAGNOSIS — Z8679 Personal history of other diseases of the circulatory system: Secondary | ICD-10-CM

## 2013-10-29 DIAGNOSIS — I10 Essential (primary) hypertension: Secondary | ICD-10-CM | POA: Diagnosis present

## 2013-10-29 DIAGNOSIS — K219 Gastro-esophageal reflux disease without esophagitis: Secondary | ICD-10-CM | POA: Diagnosis present

## 2013-10-29 DIAGNOSIS — E872 Acidosis, unspecified: Secondary | ICD-10-CM | POA: Diagnosis present

## 2013-10-29 DIAGNOSIS — R0602 Shortness of breath: Secondary | ICD-10-CM | POA: Diagnosis present

## 2013-10-29 DIAGNOSIS — J988 Other specified respiratory disorders: Secondary | ICD-10-CM | POA: Diagnosis not present

## 2013-10-29 DIAGNOSIS — I428 Other cardiomyopathies: Secondary | ICD-10-CM | POA: Diagnosis present

## 2013-10-29 DIAGNOSIS — I4891 Unspecified atrial fibrillation: Secondary | ICD-10-CM | POA: Diagnosis not present

## 2013-10-29 DIAGNOSIS — E785 Hyperlipidemia, unspecified: Secondary | ICD-10-CM | POA: Diagnosis present

## 2013-10-29 DIAGNOSIS — R091 Pleurisy: Secondary | ICD-10-CM | POA: Diagnosis not present

## 2013-10-29 DIAGNOSIS — Z7901 Long term (current) use of anticoagulants: Secondary | ICD-10-CM

## 2013-10-29 DIAGNOSIS — I5042 Chronic combined systolic (congestive) and diastolic (congestive) heart failure: Secondary | ICD-10-CM | POA: Diagnosis not present

## 2013-10-29 DIAGNOSIS — I129 Hypertensive chronic kidney disease with stage 1 through stage 4 chronic kidney disease, or unspecified chronic kidney disease: Secondary | ICD-10-CM | POA: Diagnosis present

## 2013-10-29 DIAGNOSIS — Z96659 Presence of unspecified artificial knee joint: Secondary | ICD-10-CM | POA: Diagnosis not present

## 2013-10-29 DIAGNOSIS — Z4682 Encounter for fitting and adjustment of non-vascular catheter: Secondary | ICD-10-CM | POA: Diagnosis not present

## 2013-10-29 DIAGNOSIS — I509 Heart failure, unspecified: Secondary | ICD-10-CM | POA: Diagnosis not present

## 2013-10-29 DIAGNOSIS — C61 Malignant neoplasm of prostate: Secondary | ICD-10-CM | POA: Diagnosis present

## 2013-10-29 DIAGNOSIS — Z8546 Personal history of malignant neoplasm of prostate: Secondary | ICD-10-CM | POA: Diagnosis not present

## 2013-10-29 DIAGNOSIS — F449 Dissociative and conversion disorder, unspecified: Secondary | ICD-10-CM | POA: Diagnosis not present

## 2013-10-29 DIAGNOSIS — J90 Pleural effusion, not elsewhere classified: Secondary | ICD-10-CM | POA: Diagnosis not present

## 2013-10-29 DIAGNOSIS — Z79899 Other long term (current) drug therapy: Secondary | ICD-10-CM | POA: Diagnosis not present

## 2013-10-29 DIAGNOSIS — N189 Chronic kidney disease, unspecified: Secondary | ICD-10-CM | POA: Diagnosis not present

## 2013-10-29 DIAGNOSIS — I4819 Other persistent atrial fibrillation: Secondary | ICD-10-CM

## 2013-10-29 DIAGNOSIS — I4892 Unspecified atrial flutter: Secondary | ICD-10-CM | POA: Diagnosis not present

## 2013-10-29 DIAGNOSIS — I482 Chronic atrial fibrillation, unspecified: Secondary | ICD-10-CM | POA: Diagnosis present

## 2013-10-29 DIAGNOSIS — I5189 Other ill-defined heart diseases: Secondary | ICD-10-CM | POA: Diagnosis present

## 2013-10-29 DIAGNOSIS — D62 Acute posthemorrhagic anemia: Secondary | ICD-10-CM | POA: Diagnosis not present

## 2013-10-29 DIAGNOSIS — Z9889 Other specified postprocedural states: Secondary | ICD-10-CM

## 2013-10-29 DIAGNOSIS — R0609 Other forms of dyspnea: Secondary | ICD-10-CM | POA: Diagnosis present

## 2013-10-29 DIAGNOSIS — I2510 Atherosclerotic heart disease of native coronary artery without angina pectoris: Secondary | ICD-10-CM | POA: Diagnosis present

## 2013-10-29 DIAGNOSIS — Z9911 Dependence on respirator [ventilator] status: Secondary | ICD-10-CM | POA: Diagnosis not present

## 2013-10-29 DIAGNOSIS — Z6831 Body mass index (BMI) 31.0-31.9, adult: Secondary | ICD-10-CM | POA: Diagnosis not present

## 2013-10-29 DIAGNOSIS — R5381 Other malaise: Secondary | ICD-10-CM | POA: Diagnosis not present

## 2013-10-29 DIAGNOSIS — R0600 Dyspnea, unspecified: Secondary | ICD-10-CM | POA: Diagnosis present

## 2013-10-29 DIAGNOSIS — E669 Obesity, unspecified: Secondary | ICD-10-CM | POA: Diagnosis present

## 2013-10-29 DIAGNOSIS — M6281 Muscle weakness (generalized): Secondary | ICD-10-CM | POA: Diagnosis not present

## 2013-10-29 HISTORY — DX: Personal history of other diseases of the circulatory system: Z86.79

## 2013-10-29 HISTORY — PX: INTRAOPERATIVE TRANSESOPHAGEAL ECHOCARDIOGRAM: SHX5062

## 2013-10-29 HISTORY — DX: Other specified postprocedural states: Z98.890

## 2013-10-29 HISTORY — PX: MINIMALLY INVASIVE MAZE PROCEDURE: SHX6244

## 2013-10-29 HISTORY — PX: CLIPPING OF ATRIAL APPENDAGE: SHX5773

## 2013-10-29 HISTORY — DX: Chronic kidney disease, unspecified: N18.9

## 2013-10-29 LAB — POCT I-STAT 3, ART BLOOD GAS (G3+)
ACID-BASE DEFICIT: 2 mmol/L (ref 0.0–2.0)
ACID-BASE DEFICIT: 3 mmol/L — AB (ref 0.0–2.0)
ACID-BASE DEFICIT: 3 mmol/L — AB (ref 0.0–2.0)
ACID-BASE DEFICIT: 3 mmol/L — AB (ref 0.0–2.0)
Acid-base deficit: 2 mmol/L (ref 0.0–2.0)
BICARBONATE: 23.9 meq/L (ref 20.0–24.0)
BICARBONATE: 22.9 meq/L (ref 20.0–24.0)
Bicarbonate: 23.6 mEq/L (ref 20.0–24.0)
Bicarbonate: 24.1 mEq/L — ABNORMAL HIGH (ref 20.0–24.0)
Bicarbonate: 21.7 mEq/L (ref 20.0–24.0)
O2 SAT: 99 %
O2 Saturation: 100 %
O2 Saturation: 92 %
O2 Saturation: 95 %
O2 Saturation: 89 %
PCO2 ART: 45 mmHg (ref 35.0–45.0)
PCO2 ART: 51 mmHg — AB (ref 35.0–45.0)
PO2 ART: 325 mmHg — AB (ref 80.0–100.0)
PO2 ART: 70 mmHg — AB (ref 80.0–100.0)
Patient temperature: 36.3
Patient temperature: 36.8
TCO2: 25 mmol/L (ref 0–100)
TCO2: 25 mmol/L (ref 0–100)
TCO2: 26 mmol/L (ref 0–100)
TCO2: 24 mmol/L (ref 0–100)
TCO2: 23 mmol/L (ref 0–100)
pCO2 arterial: 44.3 mmHg (ref 35.0–45.0)
pCO2 arterial: 41.8 mmHg (ref 35.0–45.0)
pCO2 arterial: 37.3 mmHg (ref 35.0–45.0)
pH, Arterial: 7.334 — ABNORMAL LOW (ref 7.350–7.450)
pH, Arterial: 7.334 — ABNORMAL LOW (ref 7.350–7.450)
pH, Arterial: 7.28 — ABNORMAL LOW (ref 7.350–7.450)
pH, Arterial: 7.342 — ABNORMAL LOW (ref 7.350–7.450)
pH, Arterial: 7.372 (ref 7.350–7.450)
pO2, Arterial: 125 mmHg — ABNORMAL HIGH (ref 80.0–100.0)
pO2, Arterial: 75 mmHg — ABNORMAL LOW (ref 80.0–100.0)
pO2, Arterial: 57 mmHg — ABNORMAL LOW (ref 80.0–100.0)

## 2013-10-29 LAB — POCT I-STAT, CHEM 8
BUN: 26 mg/dL — ABNORMAL HIGH (ref 6–23)
BUN: 23 mg/dL (ref 6–23)
BUN: 25 mg/dL — ABNORMAL HIGH (ref 6–23)
BUN: 26 mg/dL — AB (ref 6–23)
BUN: 25 mg/dL — ABNORMAL HIGH (ref 6–23)
BUN: 20 mg/dL (ref 6–23)
CALCIUM ION: 1.24 mmol/L (ref 1.13–1.30)
CALCIUM ION: 1.13 mmol/L (ref 1.13–1.30)
CALCIUM ION: 1.19 mmol/L (ref 1.13–1.30)
CALCIUM ION: 1.21 mmol/L (ref 1.13–1.30)
CHLORIDE: 104 meq/L (ref 96–112)
CHLORIDE: 107 meq/L (ref 96–112)
CREATININE: 1.4 mg/dL — AB (ref 0.50–1.35)
CREATININE: 1.2 mg/dL (ref 0.50–1.35)
Calcium, Ion: 1.12 mmol/L — ABNORMAL LOW (ref 1.13–1.30)
Calcium, Ion: 1.22 mmol/L (ref 1.13–1.30)
Chloride: 106 mEq/L (ref 96–112)
Chloride: 106 mEq/L (ref 96–112)
Chloride: 105 mEq/L (ref 96–112)
Chloride: 106 mEq/L (ref 96–112)
Creatinine, Ser: 1.3 mg/dL (ref 0.50–1.35)
Creatinine, Ser: 1.4 mg/dL — ABNORMAL HIGH (ref 0.50–1.35)
Creatinine, Ser: 1.3 mg/dL (ref 0.50–1.35)
Creatinine, Ser: 1.2 mg/dL (ref 0.50–1.35)
GLUCOSE: 153 mg/dL — AB (ref 70–99)
GLUCOSE: 164 mg/dL — AB (ref 70–99)
Glucose, Bld: 142 mg/dL — ABNORMAL HIGH (ref 70–99)
Glucose, Bld: 111 mg/dL — ABNORMAL HIGH (ref 70–99)
Glucose, Bld: 121 mg/dL — ABNORMAL HIGH (ref 70–99)
Glucose, Bld: 105 mg/dL — ABNORMAL HIGH (ref 70–99)
HCT: 33 % — ABNORMAL LOW (ref 39.0–52.0)
HCT: 26 % — ABNORMAL LOW (ref 39.0–52.0)
HCT: 32 % — ABNORMAL LOW (ref 39.0–52.0)
HCT: 38 % — ABNORMAL LOW (ref 39.0–52.0)
HEMATOCRIT: 39 % (ref 39.0–52.0)
HEMATOCRIT: 39 % (ref 39.0–52.0)
HEMOGLOBIN: 13.3 g/dL (ref 13.0–17.0)
HEMOGLOBIN: 10.9 g/dL — AB (ref 13.0–17.0)
HEMOGLOBIN: 12.9 g/dL — AB (ref 13.0–17.0)
Hemoglobin: 11.2 g/dL — ABNORMAL LOW (ref 13.0–17.0)
Hemoglobin: 13.3 g/dL (ref 13.0–17.0)
Hemoglobin: 8.8 g/dL — ABNORMAL LOW (ref 13.0–17.0)
POTASSIUM: 4 meq/L (ref 3.7–5.3)
Potassium: 4.5 mEq/L (ref 3.7–5.3)
Potassium: 3.9 mEq/L (ref 3.7–5.3)
Potassium: 5.4 mEq/L — ABNORMAL HIGH (ref 3.7–5.3)
Potassium: 4.7 mEq/L (ref 3.7–5.3)
Potassium: 4.2 mEq/L (ref 3.7–5.3)
SODIUM: 137 meq/L (ref 137–147)
Sodium: 138 mEq/L (ref 137–147)
Sodium: 139 mEq/L (ref 137–147)
Sodium: 139 mEq/L (ref 137–147)
Sodium: 135 mEq/L — ABNORMAL LOW (ref 137–147)
Sodium: 140 mEq/L (ref 137–147)
TCO2: 24 mmol/L (ref 0–100)
TCO2: 22 mmol/L (ref 0–100)
TCO2: 23 mmol/L (ref 0–100)
TCO2: 25 mmol/L (ref 0–100)
TCO2: 23 mmol/L (ref 0–100)
TCO2: 19 mmol/L (ref 0–100)

## 2013-10-29 LAB — CBC
HCT: 41.3 % (ref 39.0–52.0)
HEMATOCRIT: 37 % — AB (ref 39.0–52.0)
Hemoglobin: 13.8 g/dL (ref 13.0–17.0)
Hemoglobin: 12.5 g/dL — ABNORMAL LOW (ref 13.0–17.0)
MCH: 31.5 pg (ref 26.0–34.0)
MCH: 31.3 pg (ref 26.0–34.0)
MCHC: 33.4 g/dL (ref 30.0–36.0)
MCHC: 33.8 g/dL (ref 30.0–36.0)
MCV: 94.3 fL (ref 78.0–100.0)
MCV: 92.5 fL (ref 78.0–100.0)
Platelets: 185 10*3/uL (ref 150–400)
Platelets: 131 10*3/uL — ABNORMAL LOW (ref 150–400)
RBC: 4.38 MIL/uL (ref 4.22–5.81)
RBC: 4 MIL/uL — ABNORMAL LOW (ref 4.22–5.81)
RDW: 15.5 % (ref 11.5–15.5)
RDW: 15.2 % (ref 11.5–15.5)
WBC: 18.6 10*3/uL — ABNORMAL HIGH (ref 4.0–10.5)
WBC: 8.6 10*3/uL (ref 4.0–10.5)

## 2013-10-29 LAB — POCT I-STAT 4, (NA,K, GLUC, HGB,HCT)
Glucose, Bld: 110 mg/dL — ABNORMAL HIGH (ref 70–99)
HEMATOCRIT: 42 % (ref 39.0–52.0)
Hemoglobin: 14.3 g/dL (ref 13.0–17.0)
Potassium: 4.6 mEq/L (ref 3.7–5.3)
SODIUM: 139 meq/L (ref 137–147)

## 2013-10-29 LAB — PROTIME-INR
INR: 1.5 — AB (ref 0.00–1.49)
Prothrombin Time: 18.1 seconds — ABNORMAL HIGH (ref 11.6–15.2)

## 2013-10-29 LAB — MAGNESIUM: Magnesium: 2.6 mg/dL — ABNORMAL HIGH (ref 1.5–2.5)

## 2013-10-29 LAB — APTT: aPTT: 32 seconds (ref 24–37)

## 2013-10-29 LAB — CREATININE, SERUM
CREATININE: 1.11 mg/dL (ref 0.50–1.35)
GFR calc Af Amer: 75 mL/min — ABNORMAL LOW (ref >90)
GFR calc non Af Amer: 64 mL/min — ABNORMAL LOW (ref >90)

## 2013-10-29 LAB — PLATELET COUNT: Platelets: 142 10*3/uL — ABNORMAL LOW (ref 150–400)

## 2013-10-29 LAB — HEMOGLOBIN AND HEMATOCRIT, BLOOD
HEMATOCRIT: 28.4 % — AB (ref 39.0–52.0)
HEMOGLOBIN: 9.4 g/dL — AB (ref 13.0–17.0)

## 2013-10-29 SURGERY — MAZE PROCEDURE, CARDIAC, MINIMALLY INVASIVE
Anesthesia: General | Site: Chest

## 2013-10-29 MED ORDER — LACTATED RINGERS IV SOLN
INTRAVENOUS | Status: DC | PRN
  Administered 2013-10-29: 08:00:00 via INTRAVENOUS

## 2013-10-29 MED ORDER — PHENYLEPHRINE HCL 10 MG/ML IJ SOLN
0.0000 ug/min | INTRAVENOUS | Status: DC
  Filled 2013-10-29: qty 2

## 2013-10-29 MED ORDER — DEXMEDETOMIDINE HCL IN NACL 200 MCG/50ML IV SOLN
0.1000 ug/kg/h | INTRAVENOUS | Status: DC
  Administered 2013-10-29 – 2013-10-30 (×5): 0.7 ug/kg/h via INTRAVENOUS
  Filled 2013-10-29 (×5): qty 50

## 2013-10-29 MED ORDER — 0.9 % SODIUM CHLORIDE (POUR BTL) OPTIME
TOPICAL | Status: DC | PRN
  Administered 2013-10-29: 6000 mL

## 2013-10-29 MED ORDER — METOPROLOL TARTRATE 25 MG/10 ML ORAL SUSPENSION
12.5000 mg | Freq: Two times a day (BID) | ORAL | Status: DC
  Administered 2013-10-29: 12.5 mg
  Filled 2013-10-29 (×3): qty 5

## 2013-10-29 MED ORDER — FAMOTIDINE IN NACL 20-0.9 MG/50ML-% IV SOLN
20.0000 mg | Freq: Two times a day (BID) | INTRAVENOUS | Status: AC
  Administered 2013-10-29 (×2): 20 mg via INTRAVENOUS
  Filled 2013-10-29: qty 50

## 2013-10-29 MED ORDER — SUCCINYLCHOLINE CHLORIDE 20 MG/ML IJ SOLN
INTRAMUSCULAR | Status: DC | PRN
  Administered 2013-10-29: 100 mg via INTRAVENOUS

## 2013-10-29 MED ORDER — ACETAMINOPHEN 500 MG PO TABS
1000.0000 mg | ORAL_TABLET | Freq: Four times a day (QID) | ORAL | Status: AC
  Administered 2013-10-30 – 2013-11-03 (×15): 1000 mg via ORAL
  Filled 2013-10-29 (×19): qty 2

## 2013-10-29 MED ORDER — PHENYLEPHRINE HCL 10 MG/ML IJ SOLN
10.0000 mg | INTRAVENOUS | Status: DC | PRN
  Administered 2013-10-29: 50 ug/min via INTRAVENOUS
  Administered 2013-10-29: 30 ug/min via INTRAVENOUS

## 2013-10-29 MED ORDER — VANCOMYCIN HCL 1000 MG IV SOLR
INTRAVENOUS | Status: DC | PRN
  Administered 2013-10-29: 12:00:00

## 2013-10-29 MED ORDER — ACETAMINOPHEN 160 MG/5ML PO SOLN
1000.0000 mg | Freq: Four times a day (QID) | ORAL | Status: DC
  Administered 2013-10-30 (×2): 1000 mg
  Filled 2013-10-29 (×2): qty 40.6

## 2013-10-29 MED ORDER — FENTANYL CITRATE 0.05 MG/ML IJ SOLN
INTRAMUSCULAR | Status: AC
  Filled 2013-10-29: qty 5

## 2013-10-29 MED ORDER — SODIUM CHLORIDE 0.9 % IV SOLN: INTRAVENOUS | Status: AC

## 2013-10-29 MED ORDER — ROCURONIUM BROMIDE 100 MG/10ML IV SOLN
INTRAVENOUS | Status: DC | PRN
  Administered 2013-10-29: 30 mg via INTRAVENOUS
  Administered 2013-10-29: 50 mg via INTRAVENOUS
  Administered 2013-10-29 (×2): 10 mg via INTRAVENOUS

## 2013-10-29 MED ORDER — MIDAZOLAM HCL 2 MG/2ML IJ SOLN
INTRAMUSCULAR | Status: AC
  Administered 2013-10-29: 2 mg via INTRAVENOUS
  Filled 2013-10-29: qty 2

## 2013-10-29 MED ORDER — SODIUM CHLORIDE 0.9 % IJ SOLN
3.0000 mL | Freq: Two times a day (BID) | INTRAMUSCULAR | Status: DC
  Administered 2013-10-30 – 2013-11-02 (×5): 3 mL via INTRAVENOUS

## 2013-10-29 MED ORDER — VANCOMYCIN HCL 1000 MG IV SOLR
INTRAVENOUS | Status: DC
  Filled 2013-10-29: qty 1000

## 2013-10-29 MED ORDER — SODIUM CHLORIDE 0.9 % IV SOLN
Freq: Once | INTRAVENOUS | Status: AC
  Administered 2013-10-29: 16:00:00 via INTRAVENOUS

## 2013-10-29 MED ORDER — ALBUMIN HUMAN 5 % IV SOLN
250.0000 mL | INTRAVENOUS | Status: AC | PRN
  Administered 2013-10-29 (×2): 250 mL via INTRAVENOUS

## 2013-10-29 MED ORDER — DOCUSATE SODIUM 100 MG PO CAPS
200.0000 mg | ORAL_CAPSULE | Freq: Every day | ORAL | Status: DC
  Administered 2013-10-30 – 2013-11-04 (×5): 200 mg via ORAL
  Filled 2013-10-29 (×6): qty 2

## 2013-10-29 MED ORDER — MILRINONE IN DEXTROSE 20 MG/100ML IV SOLN
0.0000 ug/kg/min | INTRAVENOUS | Status: DC
  Administered 2013-10-29 – 2013-10-30 (×3): 0.3 ug/kg/min via INTRAVENOUS
  Administered 2013-10-31: 0.2 ug/kg/min via INTRAVENOUS
  Filled 2013-10-29 (×5): qty 100

## 2013-10-29 MED ORDER — SODIUM CHLORIDE 0.9 % IV SOLN
INTRAVENOUS | Status: DC | PRN
  Administered 2013-10-29: 14:00:00 via INTRAVENOUS

## 2013-10-29 MED ORDER — PROTAMINE SULFATE 10 MG/ML IV SOLN
INTRAVENOUS | Status: AC
  Filled 2013-10-29: qty 5

## 2013-10-29 MED ORDER — MILRINONE IN DEXTROSE 20 MG/100ML IV SOLN
INTRAVENOUS | Status: DC | PRN
  Administered 2013-10-29: .3 ug/kg/min via INTRAVENOUS

## 2013-10-29 MED ORDER — SODIUM CHLORIDE 0.9 % IV SOLN: 250.0000 mL | INTRAVENOUS | Status: DC

## 2013-10-29 MED ORDER — MIDAZOLAM HCL 5 MG/5ML IJ SOLN
INTRAMUSCULAR | Status: DC | PRN
  Administered 2013-10-29 (×4): 1 mg via INTRAVENOUS
  Administered 2013-10-29 (×4): 2 mg via INTRAVENOUS

## 2013-10-29 MED ORDER — BISACODYL 10 MG RE SUPP: 10.0000 mg | Freq: Every day | RECTAL | Status: DC

## 2013-10-29 MED ORDER — METOPROLOL TARTRATE 1 MG/ML IV SOLN: 2.5000 mg | INTRAVENOUS | Status: DC | PRN

## 2013-10-29 MED ORDER — MORPHINE SULFATE 2 MG/ML IJ SOLN
1.0000 mg | INTRAMUSCULAR | Status: AC | PRN
  Administered 2013-10-30: 4 mg via INTRAVENOUS
  Filled 2013-10-29: qty 2
  Filled 2013-10-29: qty 1

## 2013-10-29 MED ORDER — METOPROLOL TARTRATE 12.5 MG HALF TABLET
12.5000 mg | ORAL_TABLET | Freq: Two times a day (BID) | ORAL | Status: DC
  Filled 2013-10-29 (×3): qty 1

## 2013-10-29 MED ORDER — ONDANSETRON HCL 4 MG/2ML IJ SOLN: 4.0000 mg | Freq: Four times a day (QID) | INTRAMUSCULAR | Status: DC | PRN

## 2013-10-29 MED ORDER — ARTIFICIAL TEARS OP OINT
TOPICAL_OINTMENT | OPHTHALMIC | Status: DC | PRN
  Administered 2013-10-29: 1 via OPHTHALMIC

## 2013-10-29 MED ORDER — FENTANYL CITRATE 0.05 MG/ML IJ SOLN
INTRAMUSCULAR | Status: DC | PRN
  Administered 2013-10-29: 150 ug via INTRAVENOUS
  Administered 2013-10-29 (×3): 100 ug via INTRAVENOUS
  Administered 2013-10-29 (×2): 50 ug via INTRAVENOUS
  Administered 2013-10-29 (×2): 150 ug via INTRAVENOUS
  Administered 2013-10-29 (×2): 100 ug via INTRAVENOUS
  Administered 2013-10-29: 150 ug via INTRAVENOUS
  Administered 2013-10-29: 50 ug via INTRAVENOUS
  Administered 2013-10-29 (×2): 150 ug via INTRAVENOUS
  Administered 2013-10-29 (×2): 50 ug via INTRAVENOUS
  Administered 2013-10-29: 100 ug via INTRAVENOUS

## 2013-10-29 MED ORDER — POTASSIUM CHLORIDE 10 MEQ/50ML IV SOLN: 10.0000 meq | INTRAVENOUS | Status: AC

## 2013-10-29 MED ORDER — MORPHINE SULFATE 2 MG/ML IJ SOLN
2.0000 mg | INTRAMUSCULAR | Status: DC | PRN
  Administered 2013-10-29: 2 mg via INTRAVENOUS
  Administered 2013-10-30: 4 mg via INTRAVENOUS
  Filled 2013-10-29: qty 2

## 2013-10-29 MED ORDER — SODIUM CHLORIDE 0.9 % IV SOLN
INTRAVENOUS | Status: DC
  Administered 2013-10-29: 16:00:00 via INTRAVENOUS

## 2013-10-29 MED ORDER — ASPIRIN EC 325 MG PO TBEC
325.0000 mg | DELAYED_RELEASE_TABLET | Freq: Every day | ORAL | Status: DC
  Administered 2013-10-30 – 2013-11-04 (×6): 325 mg via ORAL
  Filled 2013-10-29 (×6): qty 1

## 2013-10-29 MED ORDER — HEPARIN SODIUM (PORCINE) 1000 UNIT/ML IJ SOLN
INTRAMUSCULAR | Status: AC
  Filled 2013-10-29: qty 1

## 2013-10-29 MED ORDER — PROPOFOL 10 MG/ML IV BOLUS
INTRAVENOUS | Status: AC
  Filled 2013-10-29: qty 20

## 2013-10-29 MED ORDER — HEPARIN SODIUM (PORCINE) 1000 UNIT/ML IJ SOLN
INTRAMUSCULAR | Status: DC | PRN
  Administered 2013-10-29: 40000 [IU] via INTRAVENOUS

## 2013-10-29 MED ORDER — ASPIRIN 81 MG PO CHEW: 324.0000 mg | CHEWABLE_TABLET | Freq: Every day | ORAL | Status: DC

## 2013-10-29 MED ORDER — MIDAZOLAM HCL 2 MG/2ML IJ SOLN
INTRAMUSCULAR | Status: AC
  Filled 2013-10-29: qty 2

## 2013-10-29 MED ORDER — PANTOPRAZOLE SODIUM 40 MG PO TBEC
40.0000 mg | DELAYED_RELEASE_TABLET | Freq: Every day | ORAL | Status: DC
Start: 2013-10-31 — End: 2013-11-04
  Administered 2013-10-31 – 2013-11-04 (×5): 40 mg via ORAL
  Filled 2013-10-29 (×5): qty 1

## 2013-10-29 MED ORDER — VANCOMYCIN HCL IN DEXTROSE 1-5 GM/200ML-% IV SOLN
1000.0000 mg | Freq: Once | INTRAVENOUS | Status: AC
  Administered 2013-10-29: 1000 mg via INTRAVENOUS
  Filled 2013-10-29: qty 200

## 2013-10-29 MED ORDER — LACTATED RINGERS IV SOLN: INTRAVENOUS | Status: DC

## 2013-10-29 MED ORDER — MILRINONE IN DEXTROSE 20 MG/100ML IV SOLN
0.3750 ug/kg/min | INTRAVENOUS | Status: DC
  Filled 2013-10-29: qty 100

## 2013-10-29 MED ORDER — SODIUM CHLORIDE 0.9 % IV SOLN
INTRAVENOUS | Status: DC
  Administered 2013-10-29: 1.3 [IU]/h via INTRAVENOUS
  Filled 2013-10-29: qty 2.5

## 2013-10-29 MED ORDER — MAGNESIUM SULFATE 4000MG/100ML IJ SOLN
4.0000 g | Freq: Once | INTRAMUSCULAR | Status: AC
  Administered 2013-10-29: 4 g via INTRAVENOUS
  Filled 2013-10-29: qty 100

## 2013-10-29 MED ORDER — INSULIN REGULAR BOLUS VIA INFUSION
0.0000 [IU] | Freq: Three times a day (TID) | INTRAVENOUS | Status: DC
  Filled 2013-10-29: qty 10

## 2013-10-29 MED ORDER — BISACODYL 5 MG PO TBEC
10.0000 mg | DELAYED_RELEASE_TABLET | Freq: Every day | ORAL | Status: DC
  Administered 2013-10-30 – 2013-11-02 (×3): 10 mg via ORAL
  Filled 2013-10-29 (×3): qty 2

## 2013-10-29 MED ORDER — PROPOFOL 10 MG/ML IV BOLUS
INTRAVENOUS | Status: DC | PRN
  Administered 2013-10-29: 40 mg via INTRAVENOUS
  Administered 2013-10-29: 30 mg via INTRAVENOUS
  Administered 2013-10-29: 20 mg via INTRAVENOUS

## 2013-10-29 MED ORDER — ACETAMINOPHEN 650 MG RE SUPP
650.0000 mg | Freq: Once | RECTAL | Status: AC
  Administered 2013-10-29: 650 mg via RECTAL

## 2013-10-29 MED ORDER — DEXMEDETOMIDINE HCL IN NACL 400 MCG/100ML IV SOLN
0.1000 ug/kg/h | INTRAVENOUS | Status: DC
  Filled 2013-10-29: qty 100

## 2013-10-29 MED ORDER — ACETAMINOPHEN 160 MG/5ML PO SOLN: 650.0000 mg | Freq: Once | ORAL | Status: AC

## 2013-10-29 MED ORDER — CHLORHEXIDINE GLUCONATE 4 % EX LIQD
30.0000 mL | CUTANEOUS | Status: DC
  Filled 2013-10-29: qty 30

## 2013-10-29 MED ORDER — DEXTROSE 5 % IV SOLN
750.0000 mg | INTRAVENOUS | Status: DC
  Filled 2013-10-29: qty 750

## 2013-10-29 MED ORDER — DOPAMINE-DEXTROSE 3.2-5 MG/ML-% IV SOLN: 0.0000 ug/kg/min | INTRAVENOUS | Status: DC

## 2013-10-29 MED ORDER — PROTAMINE SULFATE 10 MG/ML IV SOLN
INTRAVENOUS | Status: AC
  Filled 2013-10-29: qty 25

## 2013-10-29 MED ORDER — NITROGLYCERIN IN D5W 200-5 MCG/ML-% IV SOLN: 0.0000 ug/min | INTRAVENOUS | Status: DC

## 2013-10-29 MED ORDER — SODIUM CHLORIDE 0.45 % IV SOLN
INTRAVENOUS | Status: DC
  Administered 2013-10-29: 16:00:00 via INTRAVENOUS

## 2013-10-29 MED ORDER — MIDAZOLAM HCL 10 MG/2ML IJ SOLN
INTRAMUSCULAR | Status: AC
  Filled 2013-10-29: qty 2

## 2013-10-29 MED ORDER — PROTAMINE SULFATE 10 MG/ML IV SOLN
INTRAVENOUS | Status: DC | PRN
  Administered 2013-10-29 (×4): 50 mg via INTRAVENOUS
  Administered 2013-10-29: 25 mg via INTRAVENOUS
  Administered 2013-10-29: 50 mg via INTRAVENOUS
  Administered 2013-10-29: 25 mg via INTRAVENOUS

## 2013-10-29 MED ORDER — SODIUM CHLORIDE 0.9 % IJ SOLN
3.0000 mL | INTRAMUSCULAR | Status: DC | PRN
  Administered 2013-10-29: 18:00:00 via INTRAVENOUS

## 2013-10-29 MED ORDER — VECURONIUM BROMIDE 10 MG IV SOLR
INTRAVENOUS | Status: DC | PRN
  Administered 2013-10-29 (×3): 3 mg via INTRAVENOUS

## 2013-10-29 MED ORDER — DEXTROSE 5 % IV SOLN
1.5000 g | Freq: Two times a day (BID) | INTRAVENOUS | Status: AC
  Administered 2013-10-29 – 2013-10-31 (×4): 1.5 g via INTRAVENOUS
  Filled 2013-10-29 (×4): qty 1.5

## 2013-10-29 MED ORDER — OXYCODONE HCL 5 MG PO TABS
5.0000 mg | ORAL_TABLET | ORAL | Status: DC | PRN
  Administered 2013-10-30 – 2013-11-03 (×11): 10 mg via ORAL
  Filled 2013-10-29 (×11): qty 2

## 2013-10-29 MED ORDER — MIDAZOLAM HCL 2 MG/2ML IJ SOLN
2.0000 mg | INTRAMUSCULAR | Status: DC | PRN
  Administered 2013-10-29 – 2013-10-30 (×4): 2 mg via INTRAVENOUS
  Filled 2013-10-29 (×3): qty 2

## 2013-10-29 MED ORDER — SODIUM CHLORIDE 0.9 % IR SOLN
Status: DC | PRN
  Administered 2013-10-29: 10:00:00

## 2013-10-29 MED ORDER — LACTATED RINGERS IV SOLN: 500.0000 mL | Freq: Once | INTRAVENOUS | Status: AC | PRN

## 2013-10-29 SURGICAL SUPPLY — 103 items
ADAPTER CARDIO PERF ANTE/RETRO (ADAPTER) ×4 IMPLANT
ADH SKN CLS APL DERMABOND .7 (GAUZE/BANDAGES/DRESSINGS) ×3
ADPR PRFSN 84XANTGRD RTRGD (ADAPTER) ×3
APL SKNCLS STERI-STRIP NONHPOA (GAUZE/BANDAGES/DRESSINGS) ×3
BAG DECANTER FOR FLEXI CONT (MISCELLANEOUS) ×4 IMPLANT
BENZOIN TINCTURE PRP APPL 2/3 (GAUZE/BANDAGES/DRESSINGS) ×4 IMPLANT
BLADE SURG 11 STRL SS (BLADE) ×4 IMPLANT
CANISTER SUCTION 2500CC (MISCELLANEOUS) ×8 IMPLANT
CANNULA FEM VENOUS REMOTE 22FR (CANNULA) ×1 IMPLANT
CANNULA FEMORAL ART 14 SM (MISCELLANEOUS) ×4 IMPLANT
CANNULA GUNDRY RCSP 15FR (MISCELLANEOUS) ×4 IMPLANT
CANNULA OPTISITE PERFUSION 16F (CANNULA) IMPLANT
CANNULA OPTISITE PERFUSION 18F (CANNULA) ×1 IMPLANT
CARDIOBLATE CARDIAC ABLATION (MISCELLANEOUS) ×4
CLAMP ISOLATOR SYNERGY LG (MISCELLANEOUS) ×1 IMPLANT
CONN ST 1/4X3/8  BEN (MISCELLANEOUS) ×2
CONN ST 1/4X3/8 BEN (MISCELLANEOUS) ×6 IMPLANT
COVER MAYO STAND STRL (DRAPES) ×4 IMPLANT
COVER PROBE W GEL 5X96 (DRAPES) ×1 IMPLANT
COVER SURGICAL LIGHT HANDLE (MISCELLANEOUS) ×4 IMPLANT
CRADLE DONUT ADULT HEAD (MISCELLANEOUS) ×4 IMPLANT
DERMABOND ADVANCED (GAUZE/BANDAGES/DRESSINGS) ×1
DERMABOND ADVANCED .7 DNX12 (GAUZE/BANDAGES/DRESSINGS) ×6 IMPLANT
DEVICE CARDIOBLATE CARDIAC ABL (MISCELLANEOUS) IMPLANT
DEVICE TROCAR PUNCTURE CLOSURE (ENDOMECHANICALS) ×4 IMPLANT
DRAIN CHANNEL 28F RND 3/8 FF (WOUND CARE) ×8 IMPLANT
DRAPE BILATERAL SPLIT (DRAPES) ×4 IMPLANT
DRAPE C-ARM 42X72 X-RAY (DRAPES) ×4 IMPLANT
DRAPE CV SPLIT W-CLR ANES SCRN (DRAPES) ×4 IMPLANT
DRAPE INCISE IOBAN 66X45 STRL (DRAPES) ×9 IMPLANT
DRAPE SLUSH/WARMER DISC (DRAPES) ×4 IMPLANT
DRSG COVADERM 4X6 (GAUZE/BANDAGES/DRESSINGS) ×1 IMPLANT
DRSG COVADERM 4X8 (GAUZE/BANDAGES/DRESSINGS) ×4 IMPLANT
ELECT BLADE 6.5 EXT (BLADE) ×4 IMPLANT
ELECT REM PT RETURN 9FT ADLT (ELECTROSURGICAL) ×8
ELECTRODE REM PT RTRN 9FT ADLT (ELECTROSURGICAL) ×6 IMPLANT
FEMORAL VENOUS CANN RAP (CANNULA) IMPLANT
GAUZE SPONGE 4X4 12PLY STRL (GAUZE/BANDAGES/DRESSINGS) ×4 IMPLANT
GLOVE ORTHO TXT STRL SZ7.5 (GLOVE) ×12 IMPLANT
GOWN STRL REUS W/ TWL LRG LVL3 (GOWN DISPOSABLE) ×12 IMPLANT
GOWN STRL REUS W/TWL LRG LVL3 (GOWN DISPOSABLE) ×16
GUIDEWIRE ANG ZIPWIRE 038X150 (WIRE) ×4 IMPLANT
INSERT CONFORM CROSS CLAMP 66M (MISCELLANEOUS) IMPLANT
INSERT CONFORM CROSS CLAMP 86M (MISCELLANEOUS) IMPLANT
KIT BASIN OR (CUSTOM PROCEDURE TRAY) ×4 IMPLANT
KIT DILATOR VASC 18G NDL (KITS) ×4 IMPLANT
KIT DRAINAGE VACCUM ASSIST (KITS) ×1 IMPLANT
KIT ROOM TURNOVER OR (KITS) ×4 IMPLANT
KIT SUCTION CATH 14FR (SUCTIONS) ×4 IMPLANT
LEAD PACING MYOCARDI (MISCELLANEOUS) ×4 IMPLANT
LINE VENT (MISCELLANEOUS) ×1 IMPLANT
MEDTRONIC 18 FR. ONE-PIECE ARTERIAL CANNULA ×1 IMPLANT
NDL AORTIC ROOT 14G 7F (CATHETERS) ×3 IMPLANT
NEEDLE AORTIC ROOT 14G 7F (CATHETERS) ×4 IMPLANT
NS IRRIG 1000ML POUR BTL (IV SOLUTION) ×21 IMPLANT
PACK OPEN HEART (CUSTOM PROCEDURE TRAY) ×4 IMPLANT
PAD ARMBOARD 7.5X6 YLW CONV (MISCELLANEOUS) ×8 IMPLANT
PAD ELECT DEFIB RADIOL ZOLL (MISCELLANEOUS) ×4 IMPLANT
PATCH CORMATRIX 4CMX7CM (Prosthesis & Implant Heart) ×1 IMPLANT
PROBE CRYO2-ABLATION MALLABLE (MISCELLANEOUS) ×2 IMPLANT
RETRACTOR TRL SOFT TISSUE LG (INSTRUMENTS) IMPLANT
RETRACTOR TRM SOFT TISSUE 7.5 (INSTRUMENTS) ×1 IMPLANT
SET CANNULATION TOURNIQUET (MISCELLANEOUS) ×4 IMPLANT
SET CARDIOPLEGIA MPS 5001102 (MISCELLANEOUS) ×1 IMPLANT
SET IRRIG TUBING LAPAROSCOPIC (IRRIGATION / IRRIGATOR) ×4 IMPLANT
SOLUTION ANTI FOG 6CC (MISCELLANEOUS) ×4 IMPLANT
SPONGE GAUZE 4X4 12PLY STER LF (GAUZE/BANDAGES/DRESSINGS) ×2 IMPLANT
SUCKER WEIGHTED FLEX (MISCELLANEOUS) ×8 IMPLANT
SUT BONE WAX W31G (SUTURE) ×4 IMPLANT
SUT E-PACK MINIMALLY INVASIVE (SUTURE) ×4 IMPLANT
SUT ETHIBOND (SUTURE) IMPLANT
SUT ETHIBOND 2 0 SH (SUTURE) IMPLANT
SUT ETHIBOND 2 0 V4 (SUTURE) IMPLANT
SUT ETHIBOND 2 0V4 GREEN (SUTURE) IMPLANT
SUT ETHIBOND 2-0 RB-1 WHT (SUTURE) IMPLANT
SUT ETHIBOND 4 0 TF (SUTURE) IMPLANT
SUT ETHIBOND 5 0 C 1 30 (SUTURE) IMPLANT
SUT ETHIBOND NAB MH 2-0 36IN (SUTURE) IMPLANT
SUT ETHIBOND X763 2 0 SH 1 (SUTURE) ×4 IMPLANT
SUT GORETEX 6.0 TH-9 30 IN (SUTURE) IMPLANT
SUT GORETEX CV 4 TH 22 36 (SUTURE) ×4 IMPLANT
SUT GORETEX CV-5THC-13 36IN (SUTURE) IMPLANT
SUT GORETEX CV4 TH-18 (SUTURE) ×8 IMPLANT
SUT GORETEX TH-18 36 INCH (SUTURE) IMPLANT
SUT PROLENE 3 0 SH1 36 (SUTURE) ×9 IMPLANT
SUT PROLENE 4 0 RB 1 (SUTURE) ×16
SUT PROLENE 4-0 RB1 18X2 ARM (SUTURE) IMPLANT
SUT PROLENE 6 0 C 1 30 (SUTURE) ×2 IMPLANT
SUT VIC AB 3-0 SH 8-18 (SUTURE) ×1 IMPLANT
SYRINGE 10CC LL (SYRINGE) ×4 IMPLANT
SYS ARTICLIP LAA EXCLUSION 145 (Clip) ×1 IMPLANT
SYS ATRICLIP LAA EXCLUSION 45 (CLIP) IMPLANT
SYSTEM SAHARA CHEST DRAIN ATS (WOUND CARE) ×8 IMPLANT
TAPE CLOTH SURG 4X10 WHT LF (GAUZE/BANDAGES/DRESSINGS) ×1 IMPLANT
TOWEL OR 17X24 6PK STRL BLUE (TOWEL DISPOSABLE) ×8 IMPLANT
TOWEL OR 17X26 10 PK STRL BLUE (TOWEL DISPOSABLE) ×8 IMPLANT
TRAY FOLEY IC TEMP SENS 16FR (CATHETERS) ×4 IMPLANT
TROCAR XCEL BLADELESS 5X75MML (TROCAR) ×4 IMPLANT
TROCAR XCEL NON-BLD 11X100MML (ENDOMECHANICALS) ×8 IMPLANT
TUNNELER SHEATH ON-Q 11GX8 DSP (PAIN MANAGEMENT) IMPLANT
UNDERPAD 30X30 INCONTINENT (UNDERPADS AND DIAPERS) ×4 IMPLANT
WATER STERILE IRR 1000ML POUR (IV SOLUTION) ×8 IMPLANT
WIRE BENTSON .035X145CM (WIRE) ×4 IMPLANT

## 2013-10-29 NOTE — Transfer of Care (Signed)
Immediate Anesthesia Transfer of Care Note  Patient: Jose Snyder  Procedure(s) Performed: Procedure(s): MINIMALLY INVASIVE MAZE PROCEDURE (N/A) INTRAOPERATIVE TRANSESOPHAGEAL ECHOCARDIOGRAM (N/A) CLIPPING OF ATRIAL APPENDAGE  Patient Location: ICU  Anesthesia Type:General  Level of Consciousness: sedated and Patient remains intubated per anesthesia plan  Airway & Oxygen Therapy: Patient remains intubated per anesthesia plan and Patient placed on Ventilator (see vital sign flow sheet for setting)  Post-op Assessment: Report given to PACU RN and Post -op Vital signs reviewed and stable  Post vital signs: Reviewed and stable  Complications: No apparent anesthesia complications

## 2013-10-29 NOTE — Interval H&P Note (Signed)
History and Physical Interval Note:  10/29/2013 7:04 AM  Jose Snyder  has presented today for surgery, with the diagnosis of atrial fibrillation  The various methods of treatment have been discussed with the patient and family. After consideration of risks, benefits and other options for treatment, the patient has consented to  Procedure(s): MINIMALLY INVASIVE MAZE PROCEDURE (N/A) INTRAOPERATIVE TRANSESOPHAGEAL ECHOCARDIOGRAM (N/A) as a surgical intervention .  The patient's history has been reviewed, patient examined, no change in status, stable for surgery.  I have reviewed the patient's chart and labs.  Questions were answered to the patient's satisfaction.     OWEN,CLARENCE H

## 2013-10-29 NOTE — Brief Op Note (Signed)
      [address]       [place] [address]             [phone]     10/29/2013  1:43 PM  PATIENT:  Jose Snyder  73 y.o. male  PRE-OPERATIVE DIAGNOSIS:  atrial fibrillation  POST-OPERATIVE DIAGNOSIS:  atrial fibrillation  PROCEDURE:  Procedure(s): MINIMALLY INVASIVE MAZE PROCEDURE, LAA CLIP INTRAOPERATIVE TRANSESOPHAGEAL ECHOCARDIOGRAM  SURGEON:    Rexene Alberts, MD  ASSISTANTS:  John Giovanni, PA-C  ANESTHESIA:   Laurie Panda, MD  CROSSCLAMP TIME:   9'  CARDIOPULMONARY BYPASS TIME: 154'  FINDINGS:  Non-ischemic cardiomyopathy w/ severe global LV dysfunction, EF 25%  Moderate LVH with diastolic dysfunction  Bilateral atrial enlargement  Trace/mild mitral regurgitation  Trace aortic insufficiency  Trace/mild tricuspid regurgitation  Maze Procedure  Radiofrequency:  Yes.  Bipolar: Yes.  Cut-and-sew:  No.  Cryo: Yes  Lesions (select all that apply):    1   Pulmonary Vein Isolation,    2   Box Lesion,   3a  Inferior Pulmonary Vein Connecting Lesion,   3b  Superior Pulmonary Vein Connecting Lesion,     4  Posterior Mirtal Annular Line,     5  Pulmonary Vein Connecting Lesion to Anterior Mitral Annulus,     6  Mitral Valve Cryo Lesion,     7  LAA Ligation/Removal,     9   Intercaval Line to Tricuspid Annulus ("T" Lesion),    11  Intercaval Line,    13  Tricuspid Cryo Lesion,   15a  RAA Lateral Wall (Short) and   15b  RAA Lateral Wall to "T" Lesion  COMPLICATIONS: None  BASELINE WEIGHT: 109 kg  PATIENT DISPOSITION:   TO SICU IN STABLE CONDITION  Jose Snyder 10/29/2013 2:46 PM

## 2013-10-29 NOTE — OR Nursing (Signed)
14:43 - 1st call to SICU.

## 2013-10-29 NOTE — Op Note (Signed)
CARDIOTHORACIC SURGERY OPERATIVE NOTE  Date of Procedure:   10/29/2013  Preoperative Diagnosis:    Recurrent Persistent Atrial Fibrillation  Postoperative Diagnosis: Same  Procedure:    Minimally-Invasive Maze Procedure  Complete bilateral atrial lesion set using cryothermy and bipolar radiofrequency ablation  Clipping of Left Atrial Appendage (Atricure left atrial clip, size 45 mm)  Surgeon: Valentina Gu. Roxy Manns, MD  Assistant: John Giovanni, PA-C  Anesthesia: Laurie Panda, MD  Operative Findings: Non-ischemic cardiomyopathy w/ severe global LV dysfunction, EF 25%  Moderate LVH with diastolic dysfunction  Bilateral atrial enlargement  Trace/mild mitral regurgitation  Trace aortic insufficiency  Trace/mild tricuspid regurgitation            BRIEF CLINICAL NOTE AND INDICATIONS FOR SURGERY  Patient is a 73 year old retired white male from [place] with history of hypertension, nonischemic cardiomyopathy, chronic combined systolic and diastolic congestive heart failure, obesity who is been referred for possible surgical treatment of recurrent persistent atrial fibrillation. The patient states that he first began to experience symptoms of exertional shortness of breath some time last year. He reportedly had some pulmonary function tests performed at the [facility] medical clinic in [place] last fall, and subsequently symptoms were attributed to seasonal allergies. Beginning this spring the patient began to experience significant progression of symptoms of exertional shortness of breath. Ultimately he was hospitalized in April when he was diagnosed with rapid atrial fibrillation and acute exacerbation of chronic combined systolic and diastolic congestive heart failure. Transthoracic echocardiogram performed at that time demonstrated ejection fraction 20-25%. Diagnostic cardiac catheterization was performed notable for moderate nonobstructive coronary artery disease. The patient's  nonischemic cardiomyopathy was felt most likely to be related to tachycardia. He underwent DC cardioversion during that admission but promptly returned into atrial fibrillation. He was loaded with amiodarone and anticoagulated using Eliquis. Since hospital discharge she has been seen in followup on several occasions by Dr. Stanford Breed, and he underwent repeat DC cardioversion 07/31/2013. He initially did well and remained in sinus rhythm when he was seen in followup on June 17. However, the patient subsequently has developed recurrent persistent atrial fibrillation. He was referred to Dr. Rayann Heman to consider EP study and radiofrequency Afib ablation, He was felt to be relatively poor candidate because of enlarged left atrial size and other comorbid medical problems. The patient was referred for surgical consultation to consider elective Maze procedure.  The patient has been seen in consultation and counseled at length regarding the indications, risks and potential benefits of surgery.  All questions have been answered, and the patient provides full informed consent for the operation as described.     DETAILS OF THE OPERATIVE PROCEDURE  Preparation:  The patient is brought to the operating room on the above mentioned date and central monitoring was established by the anesthesia team including placement of Swan-Ganz catheter through the left internal jugular vein.  A radial arterial line is placed. The patient is placed in the supine position on the operating table.  Intravenous antibiotics are administered. General endotracheal anesthesia is induced uneventfully. The patient is initially intubated using a dual lumen endotracheal tube.  A Foley catheter is placed.  Baseline transesophageal echocardiogram was performed.  Findings were notable for non-ischemic cardiomyopathy.  There was severe global LV dysfunction with EF estimated 25%.  There was moderate LVH with diastolic dysfunction.  There were no regional  wall motion abnormalities.  There was trace/mild mitral regurgitation, trace aortic insufficiency and trace/mild tricuspid regurgitation.  A soft roll is placed behind the patient's  left scapula and the neck gently extended and turned to the left.   The patient's right neck, chest, abdomen, both groins, and both lower extremities are prepared and draped in a sterile manner. A time out procedure is performed.   Surgical Approach:  A right miniature anterolateral thoracotomy incision is performed. The incision is placed just lateral to and superior to the right nipple. The pectoralis major muscle is retracted medially and completely preserved. The right pleural space is entered through the 4th intercostal space. A soft tissue retractor is placed.  Two 11 mm ports are placed through separate stab incisions inferiorly. The right pleural space is insufflated continuously with carbon dioxide gas through the posterior port during the remainder of the operation.  A pledgeted sutures placed through the dome of the right hemidiaphragm and retracted inferiorly to facilitate exposure.  A longitudinal incision is made in the pericardium 3 cm anterior to the phrenic nerve and silk traction sutures are placed on either side of the incision for exposure.   Extracorporeal Cardiopulmonary Bypass and Myocardial Protection:  A small incision is made in the right inguinal crease and the anterior surface of the right common femoral artery and right common femoral vein are identified.  The patient is placed in Trendelenburg position. The right internal jugular vein is cannulated with Seldinger technique and a guidewire advanced into the right atrium. The patient is heparinized systemically. The right internal jugular vein is cannulated with a 14 [place] pediatric femoral venous cannula. Pursestring sutures are placed on the anterior surface of the right common femoral vein and right common femoral artery. The right common  femoral vein is cannulated with the Seldinger technique and a guidewire is advanced under transesophageal echocardiogram guidance through the right atrium. The femoral vein is cannulated with a long 22 French femoral venous cannula. The right common femoral artery is cannulated with Seldinger technique and a flexible guidewire is advanced until it can be appreciated intraluminally in the descending thoracic aorta on transesophageal echocardiogram. The femoral artery is cannulated with an 18 French femoral arterial cannula.  Adequate heparinization is verified.     The entire pre-bypass portion of the operation was notable for stable hemodynamics.  Cardiopulmonary bypass was begun.  Vacuum assist venous drainage is utilized. The incision in the pericardium is extended in both directions. Venous drainage and exposure are notably excellent.   An antegrade cardioplegia cannula is placed in the ascending aorta.  The patient is cooled to 28C systemic temperature.  The aortic cross clamp is applied and cold blood cardioplegia is delivered in an antegrade fashion through the aortic root. The initial cardioplegic arrest is rapid with early diastolic arrest.  Repeat doses of cardioplegia are administered intermittently every 20 to 30 minutes throughout the entire cross clamp portion of the operation through the aortic root in order to maintain completely flat electrocardiogram.  Myocardial protection was felt to be excellent.   Maze Procedure:  The left atrial appendage is obliterated using an Atricure left atrial appendage clip (Atriclip, size 63mm).  The clip is applied under thoracoscopic visualization posterior to the aorta and anterior to the pulmonary artery through the transverse sinus prior to application of the aortic crossclamp, with transesophageal echocardiographic confirmation that the clip satisfactorily obliterates the appendage.  Following placement of the aortic crossclamp and the administration  of the initial arresting dose of cardioplegia, a left atriotomy incision was performed through the interatrial groove and extended partially across the back wall of the left atrium after opening  the oblique sinus inferiorly.  The mitral valve and floor of the left atrium are exposed using a self-retaining retractor.    The Atricure CryoICE nitrous oxide cryothermy system is utilized for all cryothermy ablation lesions.  The left atrial lesion set of the Iezzi cryomaze procedure is now performed using 3 minute duration for all cryothermy lesions.  Initially a lesion is placed along the endocardial surface of the left atrium from the caudad apex of the atriotomy incision across the posterior wall of the left atrium onto the posterior mitral annulus.  A mirror image lesion along the epicardial surface is then performed with the probe posterior to the left atrium, crossing over the coronary sinus.  Two lesions are then performed to create a box isolating all of the pulmonary veins from the remainder of the left atrium.  The first lesion is placed from the cephalad apex of the atriotomy incision across the dome of the left atrium to just anterior to the left sided pulmonary veins.  The second lesion completes the box from the caudad apex of the atriotomy incision across the back wall of the left atrium to connect with the previous lesion just anterior to the left sided pulmonary veins.  Because of the large left atrial size, an additional lesion is placed to bisect the backwall of the left atrium and isolate the left sided pulmonary veins from the right.  Finally, a lesion is placed from the left sided pulmonary veins across the base of the left atrial appendage to the mitral annulus in the P1 region.    The atriotomy was closed using a 2-layer closure of running 3-0 Prolene suture after placing a sump drain across the mitral valve to serve as a left ventricular vent.  One final dose of warm "hot shot" cardioplegia was  administered though the aortic root.  The aortic cross clamp was removed after a total cross clamp time of 89 minutes.  A small oblique incision is mad in the right atrium.  The AtriCure Synergy bipolar radiofrequency ablation clamp is utilized to create a series of linear lesions in the right atrium, each with one limb of the clamp along the endocardial surface and the other along the epicardial surface. The first lesion is placed from the posterior apex of the atriotomy incision and along the lateral wall of the right atrium to reach the lateral aspect of the superior vena cava. A second lesion is placed in the opposite direction from the posterior apex of the atriotomy incision along the lateral wall to reach the lateral aspect of the inferior vena cava. A third lesion is placed from the midportion of the atriotomy incision extending at a right angle to reach the tip of the right atrial appendage. A fourth lesion is placed from the anterior apex of the atriotomy incision in an anterior and inferior direction to reach the acute margin of the heart. Finally, the cryotherapy probe is utilized to complete the right atrial lesion set by placing the probe along the endocardial surface of the right atrium from the anterior apex of the atriotomy incision to reach the tricuspid annulus at the 2:00 position. The atriotomy incision is closed with a 2 layer closure of running 4-0 Prolene suture.   Procedure Completion:  Epicardial pacing wires are fixed to the inferior wall of the right ventricule and to the right atrial appendage. The patient is rewarmed to 37C temperature. The left ventricular vent is removed.  The antegrade cardioplegia cannula is removed. The  patient is weaned and disconnected from cardiopulmonary bypass.  The patient's rhythm at separation from bypass was sinus.  The patient was weaned from bypass on low dose dopamine and milrinone infusions. Total cardiopulmonary bypass time for the operation  was 154 minutes.  Followup transesophageal echocardiogram performed after separation from bypass revealed no significant changes from preoperatively.  Left ventricular function appeared somewhat improved.  The femoral arterial and venous cannulae were removed uneventfully. There was a palpable pulse in the distal right common femoral artery after removal of the cannula. Protamine was administered to reverse the anticoagulation. The right internal jugular cannula was removed and manual pressure held on the neck for 15 minutes.  Single lung ventilation was begun. The atriotomy closure was inspected for hemostasis. The pericardial sac was drained using a 28 French Bard drain placed through the anterior port incision.  The pericardium was closed using a patch of core matrix bovine submucosal tissue patch. The right pleural space is irrigated with saline solution and inspected for hemostasis. The right pleural space was drained using a 28 French Bard drain placed through the posterior port incision. The miniature thoracotomy incision was closed in multiple layers in routine fashion. The right groin incision was inspected for hemostasis and closed in multiple layers in routine fashion.  The post-bypass portion of the operation was notable for stable rhythm and hemodynamics.  No blood products were administered during the operation.   Disposition:  The patient tolerated the procedure well.  The patient was reintubated using a single lumen endotracheal tube and subsequently transported to the surgical intensive care unit in stable condition. There were no intraoperative complications. All sponge instrument and needle counts are verified correct at completion of the operation.    Valentina Gu. Roxy Manns MD 10/29/2013 2:53 PM

## 2013-10-29 NOTE — Anesthesia Preprocedure Evaluation (Signed)
Anesthesia Evaluation  Patient identified by MRN, date of birth, ID band Patient awake    Reviewed: Allergy & Precautions, H&P , NPO status , Patient's Chart, lab work & pertinent test results, reviewed documented beta blocker date and time   History of Anesthesia Complications Negative for: history of anesthetic complications  Airway Mallampati: II TM Distance: >3 FB Neck ROM: Full    Dental  (+) Edentulous Upper, Missing   Pulmonary shortness of breath and with exertion, neg sleep apnea, neg COPD breath sounds clear to auscultation        Cardiovascular hypertension, Pt. on medications and Pt. on home beta blockers - angina+ CAD, + Peripheral Vascular Disease and +CHF + dysrhythmias Atrial Fibrillation - Cardiac Defibrillator + Valvular Problems/Murmurs MR Rhythm:Irregular Rate:Normal     Neuro/Psych PSYCHIATRIC DISORDERS Anxiety negative neurological ROS     GI/Hepatic Neg liver ROS, hiatal hernia, PUD, GERD-  Medicated and Controlled,  Endo/Other  Morbid obesity  Renal/GU Renal InsufficiencyRenal disease     Musculoskeletal  (+) Arthritis -, Osteoarthritis,    Abdominal   Peds  Hematology negative hematology ROS (+)   Anesthesia Other Findings   Reproductive/Obstetrics                           Anesthesia Physical Anesthesia Plan  ASA: III  Anesthesia Plan: General   Post-op Pain Management:    Induction: Intravenous  Airway Management Planned: Oral ETT  Additional Equipment: PA Cath, CVP, TEE, Ultrasound Guidance Line Placement and Arterial line  Intra-op Plan:   Post-operative Plan: Post-operative intubation/ventilation  Informed Consent: I have reviewed the patients History and Physical, chart, labs and discussed the procedure including the risks, benefits and alternatives for the proposed anesthesia with the patient or authorized representative who has indicated his/her  understanding and acceptance.   Dental advisory given  Plan Discussed with: CRNA and Surgeon  Anesthesia Plan Comments:         Anesthesia Quick Evaluation

## 2013-10-29 NOTE — Progress Notes (Signed)
S/p minimally invasive maze  Intubated, starting to stir  BP 126/80  Pulse 90  Temp(Src) 97 F (36.1 C) (Oral)  Resp 19  Ht 6\' 2"  (1.88 m)  Wt 241 lb (109.317 kg)  BMI 30.93 kg/m2  SpO2 94%  PA 33/17  Intake/Output Summary (Last 24 hours) at 10/29/13 1705 Last data filed at 10/29/13 1442  Gross per 24 hour  Intake   2678 ml  Output   1625 ml  Net   1053 ml   Minimal Ct output  K= 4.6, HCt 42  Initial ABG after arrival showed respiratory acidosis

## 2013-10-29 NOTE — Progress Notes (Signed)
Utilization Review Completed.  

## 2013-10-29 NOTE — Progress Notes (Signed)
Echocardiogram Echocardiogram Transesophageal has been performed.  Joelene Millin 10/29/2013, 9:02 AM

## 2013-10-30 ENCOUNTER — Encounter (HOSPITAL_COMMUNITY): Payer: Self-pay | Admitting: Thoracic Surgery (Cardiothoracic Vascular Surgery)

## 2013-10-30 ENCOUNTER — Inpatient Hospital Stay (HOSPITAL_COMMUNITY): Payer: Medicare Other

## 2013-10-30 DIAGNOSIS — N189 Chronic kidney disease, unspecified: Secondary | ICD-10-CM | POA: Diagnosis present

## 2013-10-30 LAB — BASIC METABOLIC PANEL
Anion gap: 11 (ref 5–15)
BUN: 18 mg/dL (ref 6–23)
CALCIUM: 7.9 mg/dL — AB (ref 8.4–10.5)
CO2: 21 meq/L (ref 19–32)
CREATININE: 1.04 mg/dL (ref 0.50–1.35)
Chloride: 106 mEq/L (ref 96–112)
GFR calc Af Amer: 81 mL/min — ABNORMAL LOW (ref >90)
GFR, EST NON AFRICAN AMERICAN: 70 mL/min — AB (ref >90)
GLUCOSE: 155 mg/dL — AB (ref 70–99)
Potassium: 4.4 mEq/L (ref 3.7–5.3)
Sodium: 138 mEq/L (ref 137–147)

## 2013-10-30 LAB — MAGNESIUM
MAGNESIUM: 2.4 mg/dL (ref 1.5–2.5)
Magnesium: 2.4 mg/dL (ref 1.5–2.5)

## 2013-10-30 LAB — POCT I-STAT 3, ART BLOOD GAS (G3+)
Acid-base deficit: 5 mmol/L — ABNORMAL HIGH (ref 0.0–2.0)
Acid-base deficit: 3 mmol/L — ABNORMAL HIGH (ref 0.0–2.0)
Acid-base deficit: 3 mmol/L — ABNORMAL HIGH (ref 0.0–2.0)
Acid-base deficit: 3 mmol/L — ABNORMAL HIGH (ref 0.0–2.0)
Acid-base deficit: 3 mmol/L — ABNORMAL HIGH (ref 0.0–2.0)
BICARBONATE: 19.8 meq/L — AB (ref 20.0–24.0)
BICARBONATE: 20.9 meq/L (ref 20.0–24.0)
Bicarbonate: 20.8 mEq/L (ref 20.0–24.0)
Bicarbonate: 20.2 mEq/L (ref 20.0–24.0)
Bicarbonate: 20.5 mEq/L (ref 20.0–24.0)
O2 SAT: 91 %
O2 SAT: 90 %
O2 Saturation: 93 %
O2 Saturation: 88 %
O2 Saturation: 95 %
PCO2 ART: 33.9 mmHg — AB (ref 35.0–45.0)
PCO2 ART: 33.3 mmHg — AB (ref 35.0–45.0)
PCO2 ART: 32.8 mmHg — AB (ref 35.0–45.0)
PCO2 ART: 30.7 mmHg — AB (ref 35.0–45.0)
PCO2 ART: 29.4 mmHg — AB (ref 35.0–45.0)
PH ART: 7.403 (ref 7.350–7.450)
PH ART: 7.425 (ref 7.350–7.450)
PH ART: 7.448 (ref 7.350–7.450)
PO2 ART: 62 mmHg — AB (ref 80.0–100.0)
Patient temperature: 36.5
Patient temperature: 36.2
Patient temperature: 36.4
TCO2: 21 mmol/L (ref 0–100)
TCO2: 22 mmol/L (ref 0–100)
TCO2: 22 mmol/L (ref 0–100)
TCO2: 21 mmol/L (ref 0–100)
TCO2: 21 mmol/L (ref 0–100)
pH, Arterial: 7.372 (ref 7.350–7.450)
pH, Arterial: 7.406 (ref 7.350–7.450)
pO2, Arterial: 62 mmHg — ABNORMAL LOW (ref 80.0–100.0)
pO2, Arterial: 54 mmHg — ABNORMAL LOW (ref 80.0–100.0)
pO2, Arterial: 50 mmHg — ABNORMAL LOW (ref 80.0–100.0)
pO2, Arterial: 69 mmHg — ABNORMAL LOW (ref 80.0–100.0)

## 2013-10-30 LAB — CBC
HEMATOCRIT: 38.6 % — AB (ref 39.0–52.0)
HEMATOCRIT: 37.6 % — AB (ref 39.0–52.0)
HEMOGLOBIN: 12.9 g/dL — AB (ref 13.0–17.0)
Hemoglobin: 12.7 g/dL — ABNORMAL LOW (ref 13.0–17.0)
MCH: 30.8 pg (ref 26.0–34.0)
MCH: 30.8 pg (ref 26.0–34.0)
MCHC: 33.4 g/dL (ref 30.0–36.0)
MCHC: 33.8 g/dL (ref 30.0–36.0)
MCV: 92.1 fL (ref 78.0–100.0)
MCV: 91 fL (ref 78.0–100.0)
PLATELETS: 142 10*3/uL — AB (ref 150–400)
Platelets: 130 10*3/uL — ABNORMAL LOW (ref 150–400)
RBC: 4.19 MIL/uL — AB (ref 4.22–5.81)
RBC: 4.13 MIL/uL — ABNORMAL LOW (ref 4.22–5.81)
RDW: 15.1 % (ref 11.5–15.5)
RDW: 15.2 % (ref 11.5–15.5)
WBC: 9.8 10*3/uL (ref 4.0–10.5)
WBC: 14 10*3/uL — AB (ref 4.0–10.5)

## 2013-10-30 LAB — GLUCOSE, CAPILLARY
GLUCOSE-CAPILLARY: 110 mg/dL — AB (ref 70–99)
GLUCOSE-CAPILLARY: 112 mg/dL — AB (ref 70–99)
GLUCOSE-CAPILLARY: 113 mg/dL — AB (ref 70–99)
GLUCOSE-CAPILLARY: 133 mg/dL — AB (ref 70–99)
GLUCOSE-CAPILLARY: 155 mg/dL — AB (ref 70–99)
GLUCOSE-CAPILLARY: 94 mg/dL (ref 70–99)
Glucose-Capillary: 102 mg/dL — ABNORMAL HIGH (ref 70–99)
Glucose-Capillary: 103 mg/dL — ABNORMAL HIGH (ref 70–99)
Glucose-Capillary: 109 mg/dL — ABNORMAL HIGH (ref 70–99)
Glucose-Capillary: 134 mg/dL — ABNORMAL HIGH (ref 70–99)
Glucose-Capillary: 135 mg/dL — ABNORMAL HIGH (ref 70–99)
Glucose-Capillary: 136 mg/dL — ABNORMAL HIGH (ref 70–99)
Glucose-Capillary: 93 mg/dL (ref 70–99)
Glucose-Capillary: 98 mg/dL (ref 70–99)

## 2013-10-30 LAB — POCT I-STAT, CHEM 8
BUN: 19 mg/dL (ref 6–23)
CALCIUM ION: 1.21 mmol/L (ref 1.13–1.30)
Chloride: 104 mEq/L (ref 96–112)
Creatinine, Ser: 1.2 mg/dL (ref 0.50–1.35)
Glucose, Bld: 174 mg/dL — ABNORMAL HIGH (ref 70–99)
HEMATOCRIT: 38 % — AB (ref 39.0–52.0)
HEMOGLOBIN: 12.9 g/dL — AB (ref 13.0–17.0)
Potassium: 4.2 mEq/L (ref 3.7–5.3)
SODIUM: 135 meq/L — AB (ref 137–147)
TCO2: 19 mmol/L (ref 0–100)

## 2013-10-30 LAB — CREATININE, SERUM
Creatinine, Ser: 1.17 mg/dL (ref 0.50–1.35)
GFR calc Af Amer: 70 mL/min — ABNORMAL LOW (ref >90)
GFR, EST NON AFRICAN AMERICAN: 60 mL/min — AB (ref >90)

## 2013-10-30 MED ORDER — CHLORHEXIDINE GLUCONATE CLOTH 2 % EX PADS
6.0000 | MEDICATED_PAD | Freq: Every day | CUTANEOUS | Status: AC
  Administered 2013-10-30 – 2013-11-03 (×5): 6 via TOPICAL

## 2013-10-30 MED ORDER — MORPHINE SULFATE 2 MG/ML IJ SOLN: 2.0000 mg | INTRAMUSCULAR | Status: DC | PRN

## 2013-10-30 MED ORDER — FUROSEMIDE 10 MG/ML IJ SOLN
20.0000 mg | Freq: Four times a day (QID) | INTRAMUSCULAR | Status: AC
  Administered 2013-10-30 – 2013-10-31 (×3): 20 mg via INTRAVENOUS
  Filled 2013-10-30: qty 2

## 2013-10-30 MED ORDER — AMIODARONE HCL IN DEXTROSE 360-4.14 MG/200ML-% IV SOLN
30.0000 mg/h | INTRAVENOUS | Status: AC
  Administered 2013-10-30 (×2): 30 mg/h via INTRAVENOUS
  Filled 2013-10-30 (×5): qty 200

## 2013-10-30 MED ORDER — MUPIROCIN 2 % EX OINT
1.0000 "application " | TOPICAL_OINTMENT | Freq: Two times a day (BID) | CUTANEOUS | Status: AC
  Administered 2013-10-30 – 2013-11-03 (×10): 1 via NASAL

## 2013-10-30 MED ORDER — INSULIN ASPART 100 UNIT/ML ~~LOC~~ SOLN
0.0000 [IU] | SUBCUTANEOUS | Status: DC
  Administered 2013-10-30 (×5): 2 [IU] via SUBCUTANEOUS

## 2013-10-30 MED ORDER — INSULIN ASPART 100 UNIT/ML ~~LOC~~ SOLN: 0.0000 [IU] | SUBCUTANEOUS | Status: DC

## 2013-10-30 MED ORDER — FUROSEMIDE 10 MG/ML IJ SOLN
40.0000 mg | Freq: Once | INTRAMUSCULAR | Status: AC
  Administered 2013-10-30: 40 mg via INTRAVENOUS

## 2013-10-30 MED FILL — Dextrose Inj 5%: INTRAVENOUS | Qty: 250 | Status: AC

## 2013-10-30 MED FILL — Heparin Sodium (Porcine) Inj 1000 Unit/ML: INTRAMUSCULAR | Qty: 10 | Status: AC

## 2013-10-30 MED FILL — Sodium Bicarbonate IV Soln 8.4%: INTRAVENOUS | Qty: 50 | Status: AC

## 2013-10-30 MED FILL — Sodium Chloride IV Soln 0.9%: INTRAVENOUS | Qty: 3000 | Status: AC

## 2013-10-30 MED FILL — Electrolyte-R (PH 7.4) Solution: INTRAVENOUS | Qty: 5000 | Status: AC

## 2013-10-30 MED FILL — Lidocaine HCl IV Inj 20 MG/ML: INTRAVENOUS | Qty: 5 | Status: AC

## 2013-10-30 MED FILL — Norepinephrine Bitartrate IV Soln 1 MG/ML (Base Equivalent): INTRAVENOUS | Qty: 8 | Status: AC

## 2013-10-30 MED FILL — Mannitol IV Soln 20%: INTRAVENOUS | Qty: 500 | Status: AC

## 2013-10-30 MED FILL — Potassium Chloride Inj 2 mEq/ML: INTRAVENOUS | Qty: 40 | Status: AC

## 2013-10-30 MED FILL — Magnesium Sulfate Inj 50%: INTRAMUSCULAR | Qty: 10 | Status: AC

## 2013-10-30 MED FILL — Heparin Sodium (Porcine) Inj 1000 Unit/ML: INTRAMUSCULAR | Qty: 30 | Status: AC

## 2013-10-30 NOTE — Clinical Documentation Improvement (Signed)
Please clarify the clinical significance of the abnormal BUN/CR=26/1.40 amd 25/1.40 and document in pn or d/c summary    Possible Clinical Conditions?                                   Other Condition___________________                 Cannot Clinically Determine_________   Supporting Information: Risk Factors:  AFIB, Acute on Chronic Diastolic CHF, MAZE procedure   Treatment:  Monitoring 0.9 % sodium chloride infusion   Thank You, Heloise Beecham ,RN Clinical Documentation Specialist:  [place] [employer] [facility]

## 2013-10-30 NOTE — Progress Notes (Signed)
[facility] BRIEF SICU PROGRESS NOTE  1 Day Post-Op  S/P Procedure(s) (LRB): MINIMALLY INVASIVE MAZE PROCEDURE (N/A) INTRAOPERATIVE TRANSESOPHAGEAL ECHOCARDIOGRAM (N/A) CLIPPING OF ATRIAL APPENDAGE   Feels well.  Minimal soreness.  Denies SOB Maintaining NSR w/ stable BP O2 sats 95-99% on 50% facemask UOP adequate Labs okay  Plan: Wean drips slowly.  Mobilize  Jose Snyder 10/30/2013 5:36 PM

## 2013-10-30 NOTE — Anesthesia Postprocedure Evaluation (Signed)
  Anesthesia Post-op Note  Patient: Jose Snyder  Procedure(s) Performed: Procedure(s): MINIMALLY INVASIVE MAZE PROCEDURE (N/A) INTRAOPERATIVE TRANSESOPHAGEAL ECHOCARDIOGRAM (N/A) CLIPPING OF ATRIAL APPENDAGE  Patient Location: ICU  Anesthesia Type:General  Level of Consciousness: sedated  Airway and Oxygen Therapy: Patient remains intubated per anesthesia plan  Post-op Pain: none  Post-op Assessment: Post-op Vital signs reviewed, Patient's Cardiovascular Status Stable, Respiratory Function Stable, Patent Airway and Pain level not controlled  Post-op Vital Signs: Reviewed and stable  Last Vitals:  Filed Vitals:   10/30/13 0729  BP: 119/63  Pulse: 74  Temp:   Resp: 25    Complications: No apparent anesthesia complications

## 2013-10-30 NOTE — Progress Notes (Addendum)
      [address]       [place] [address]             [phone]        CARDIOTHORACIC SURGERY PROGRESS NOTE   R1 Day Post-Op Procedure(s) (LRB): MINIMALLY INVASIVE MAZE PROCEDURE (N/A) INTRAOPERATIVE TRANSESOPHAGEAL ECHOCARDIOGRAM (N/A) CLIPPING OF ATRIAL APPENDAGE  Subjective: Wide awake on vent.  Wants ET tube out.  Denies pain.  Objective: Vital signs: BP Readings from Last 1 Encounters:  10/30/13 119/63   Pulse Readings from Last 1 Encounters:  10/30/13 74   Resp Readings from Last 1 Encounters:  10/30/13 25   Temp Readings from Last 1 Encounters:  10/30/13 97.2 F (36.2 C)     Hemodynamics: PAP: (28-63)/(14-31) 44/19 mmHg CO:  [4.5 L/min-6.2 L/min] 6.2 L/min CI:  [1.9 L/min/m2-2.6 L/min/m2] 2.6 L/min/m2  Physical Exam:  Rhythm:   sinus  Breath sounds: clear  Heart sounds:  RRR  Incisions:  Dressings dry, intact  Abdomen:  Soft, non-distended, non-tender  Extremities:  Warm, well-perfused    Intake/Output from previous day: 09/02 0701 - 09/03 0700 In: 6052.4 [I.V.:4394.4; Blood:628; NG/GT:30; IV Piggyback:1000] Out: 2725 [Urine:1590; Blood:1200; Chest Tube:440] Intake/Output this shift:    Lab Results:  CBC: Recent Labs  10/29/13 2212 10/30/13 0500  WBC 8.6 9.8  HGB 12.5* 12.9*  HCT 37.0* 38.6*  PLT 131* 130*    BMET:  Recent Labs  10/27/13 1400  10/29/13 2200 10/29/13 2212 10/30/13 0500  NA 138  < > 140  --  138  K 4.9  < > 4.2  --  4.4  CL 101  < > 106  --  106  CO2 18*  --   --   --  21  GLUCOSE 134*  < > 105*  --  155*  BUN 28*  < > 20  --  18  CREATININE 1.64*  < > 1.20 1.11 1.04  CALCIUM 9.4  --   --   --  7.9*  < > = values in this interval not displayed.   CBG (last 3)   Recent Labs  10/29/13 2254 10/30/13 0003 10/30/13 0408  GLUCAP 93 109* 135*    ABG    Component Value Date/Time   PHART 7.403 10/30/2013 0642   PCO2ART 33.3* 10/30/2013 0642   PO2ART 62.0* 10/30/2013 0642   HCO3 20.9  10/30/2013 0642   TCO2 22 10/30/2013 0642   ACIDBASEDEF 3.0* 10/30/2013 0642   O2SAT 93.0 10/30/2013 0642    CXR: Mild-moderate CHF and bibasilar atelectasis R>L  Assessment/Plan: S/P Procedure(s) (LRB): MINIMALLY INVASIVE MAZE PROCEDURE (N/A) INTRAOPERATIVE TRANSESOPHAGEAL ECHOCARDIOGRAM (N/A) CLIPPING OF ATRIAL APPENDAGE  Overall stable POD1 Maintaining NSR w/ stable hemodynamics on low dose dopamine and milrinone Expected post op volume excess, moderate Acute exacerbation of chronic combined systolic and diastolic CHF due to volume excess Expected post op atelectasis, mild Non-ischemic cardiomyopathy, EF 25% Hypertension Alcohol use   Acute vent wean, extubation  Lasix to stimulate UOP  Mobilize post extubation  D/C lines  Restart amiodarone - will give it IV today   Jose Snyder 10/30/2013 7:49 AM

## 2013-10-30 NOTE — Procedures (Signed)
Extubation Procedure Note  Patient Details:   Name: Carnell Beavers DOB: 04-02-1940 MRN: [id number]   Airway Documentation:     Evaluation  O2 sats: stable throughout Complications: No apparent complications Patient did tolerate procedure well. Bilateral Breath Sounds: Rhonchi Suctioning: Oral;Airway Yes  Pt extubated to 6L [place] following Heart Protocol. PaO2 low on ABG, increased [place] to 6L per MD. NIF -60, VC 2.0. No complication during extubation. Pt able to state name and DOB post extubation.    Laymond Purser M 10/30/2013, 10:15 AM

## 2013-10-31 ENCOUNTER — Encounter (HOSPITAL_COMMUNITY): Payer: Self-pay | Admitting: Thoracic Surgery (Cardiothoracic Vascular Surgery)

## 2013-10-31 ENCOUNTER — Inpatient Hospital Stay (HOSPITAL_COMMUNITY): Payer: Medicare Other

## 2013-10-31 LAB — BASIC METABOLIC PANEL
Anion gap: 15 (ref 5–15)
BUN: 22 mg/dL (ref 6–23)
CO2: 19 mEq/L (ref 19–32)
Calcium: 8.6 mg/dL (ref 8.4–10.5)
Chloride: 100 mEq/L (ref 96–112)
Creatinine, Ser: 1.23 mg/dL (ref 0.50–1.35)
GFR, EST AFRICAN AMERICAN: 66 mL/min — AB (ref >90)
GFR, EST NON AFRICAN AMERICAN: 57 mL/min — AB (ref >90)
GLUCOSE: 126 mg/dL — AB (ref 70–99)
POTASSIUM: 4.3 meq/L (ref 3.7–5.3)
SODIUM: 134 meq/L — AB (ref 137–147)

## 2013-10-31 LAB — CBC
HCT: 36.9 % — ABNORMAL LOW (ref 39.0–52.0)
Hemoglobin: 12.5 g/dL — ABNORMAL LOW (ref 13.0–17.0)
MCH: 31 pg (ref 26.0–34.0)
MCHC: 33.9 g/dL (ref 30.0–36.0)
MCV: 91.6 fL (ref 78.0–100.0)
PLATELETS: 150 10*3/uL (ref 150–400)
RBC: 4.03 MIL/uL — AB (ref 4.22–5.81)
RDW: 15.3 % (ref 11.5–15.5)
WBC: 13.3 10*3/uL — AB (ref 4.0–10.5)

## 2013-10-31 LAB — GLUCOSE, CAPILLARY
GLUCOSE-CAPILLARY: 108 mg/dL — AB (ref 70–99)
GLUCOSE-CAPILLARY: 139 mg/dL — AB (ref 70–99)

## 2013-10-31 MED ORDER — ENOXAPARIN SODIUM 30 MG/0.3ML ~~LOC~~ SOLN
30.0000 mg | SUBCUTANEOUS | Status: DC
  Administered 2013-11-01 – 2013-11-02 (×2): 30 mg via SUBCUTANEOUS
  Filled 2013-10-31 (×3): qty 0.3

## 2013-10-31 MED ORDER — TRAMADOL HCL 50 MG PO TABS: 50.0000 mg | ORAL_TABLET | ORAL | Status: DC | PRN

## 2013-10-31 MED ORDER — TEMAZEPAM 15 MG PO CAPS: 15.0000 mg | ORAL_CAPSULE | Freq: Every evening | ORAL | Status: DC | PRN

## 2013-10-31 MED ORDER — FUROSEMIDE 40 MG PO TABS
40.0000 mg | ORAL_TABLET | Freq: Every day | ORAL | Status: DC
  Administered 2013-11-01 – 2013-11-04 (×4): 40 mg via ORAL
  Filled 2013-10-31 (×5): qty 1

## 2013-10-31 MED ORDER — SODIUM CHLORIDE 0.9 % IJ SOLN
3.0000 mL | Freq: Two times a day (BID) | INTRAMUSCULAR | Status: DC
  Administered 2013-10-31 – 2013-11-04 (×7): 3 mL via INTRAVENOUS

## 2013-10-31 MED ORDER — ATORVASTATIN CALCIUM 20 MG PO TABS
20.0000 mg | ORAL_TABLET | Freq: Every day | ORAL | Status: DC
  Administered 2013-10-31 – 2013-11-04 (×5): 20 mg via ORAL
  Filled 2013-10-31 (×6): qty 1

## 2013-10-31 MED ORDER — LISINOPRIL 5 MG PO TABS
5.0000 mg | ORAL_TABLET | Freq: Every day | ORAL | Status: DC
  Administered 2013-10-31: 5 mg via ORAL
  Filled 2013-10-31 (×3): qty 1

## 2013-10-31 MED ORDER — SODIUM CHLORIDE 0.9 % IJ SOLN: 3.0000 mL | INTRAMUSCULAR | Status: DC | PRN

## 2013-10-31 MED ORDER — SODIUM CHLORIDE 0.9 % IV SOLN: 250.0000 mL | INTRAVENOUS | Status: DC | PRN

## 2013-10-31 MED ORDER — AMIODARONE HCL 200 MG PO TABS
200.0000 mg | ORAL_TABLET | Freq: Every day | ORAL | Status: DC
  Administered 2013-10-31 – 2013-11-03 (×4): 200 mg via ORAL
  Filled 2013-10-31 (×5): qty 1

## 2013-10-31 MED ORDER — MOVING RIGHT ALONG BOOK
Freq: Once | Status: AC
  Administered 2013-10-31: 11:00:00
  Filled 2013-10-31: qty 1

## 2013-10-31 MED ORDER — POTASSIUM CHLORIDE CRYS ER 20 MEQ PO TBCR: 20.0000 meq | EXTENDED_RELEASE_TABLET | Freq: Every day | ORAL | Status: DC

## 2013-10-31 MED ORDER — FUROSEMIDE 10 MG/ML IJ SOLN
20.0000 mg | Freq: Four times a day (QID) | INTRAMUSCULAR | Status: AC
  Administered 2013-10-31 (×3): 20 mg via INTRAVENOUS
  Filled 2013-10-31 (×3): qty 2

## 2013-10-31 MED ORDER — METOPROLOL SUCCINATE 12.5 MG HALF TABLET
12.5000 mg | ORAL_TABLET | Freq: Two times a day (BID) | ORAL | Status: DC
  Administered 2013-10-31 – 2013-11-01 (×3): 12.5 mg via ORAL
  Filled 2013-10-31 (×6): qty 1

## 2013-10-31 MED ORDER — POTASSIUM CHLORIDE CRYS ER 20 MEQ PO TBCR
20.0000 meq | EXTENDED_RELEASE_TABLET | Freq: Every day | ORAL | Status: DC
  Administered 2013-11-01 – 2013-11-04 (×4): 20 meq via ORAL
  Filled 2013-10-31 (×4): qty 1

## 2013-10-31 NOTE — Clinical Social Work Psychosocial (Signed)
Clinical Social Work Department  BRIEF PSYCHOSOCIAL ASSESSMENT  10/31/2013  Patient: AMUN, STEMM Account Number: [id number] Admit date: 10/29/2013  Clinical Social Worker: Daiva Huge Date/Time: 10/31/2013 11:07 AM  Referred by: Physician Date Referred: 10/30/2013  Referred for   SNF Placement   Other Referral:  Interview type: Patient  Other interview type:  PSYCHOSOCIAL DATA  Living Status: ALONE  Admitted from facility:  Level of care:  Primary support name: 2 nieces  Primary support relationship to patient: FAMILY  Degree of support available:  good   CURRENT CONCERNS  Current Concerns   Post-Acute Placement   Other Concerns:  SOCIAL WORK ASSESSMENT / PLAN  Met with patient who was very pleasant and shared his story of having attmepted cardioversion inpast that was unsuccessful and led to this admission and procedure- he is well versed on his medical diagnosis and treatments and is hopeful this will now be resolved.   He has already initiiated a SNF stay at [facility] prior to admission and is requesting CSW proceed with this plan-   Assessment/plan status: Other - See comment  Other assessment/ plan:  FL2 and PASARR for SNF   Information/referral to community resources:  SNF list   PATIENT'S/FAMILY'S RESPONSE TO PLAN OF CARE:  Patient is appreciative of CSW assitance and agreeable to plans as above-   Eduard Clos, MSW, [place]

## 2013-10-31 NOTE — Progress Notes (Signed)
Attempted to call report to 2W 

## 2013-10-31 NOTE — Clinical Social Work Note (Signed)
Clinical Social Worker spoke with patient at bedside to provide update regarding bed offers.  Patient had requested placement at [facility] spoke with Steffanie Dunn at [facility] who states that they are able to extend the bed offer.  Patient is agreeable with discharge plan.  Facility willing to admit patient once medically stable.  CSW remains available for support and to facilitate patient discharge needs once medically ready.  Barbette Or, [place]

## 2013-10-31 NOTE — Clinical Social Work Placement (Addendum)
Clinical Social Work Department CLINICAL SOCIAL WORK PLACEMENT NOTE 10/31/2013  Patient:  RAEQWON, LUX  Account Number:  [id number] Admit date:  10/29/2013  Clinical Social Worker:  Daiva Huge  Date/time:  10/31/2013 11:13 AM  Clinical Social Work is seeking post-discharge placement for this patient at the following level of care:   SKILLED NURSING   (*CSW will update this form in Epic as items are completed)   10/31/2013  Patient/family provided with [place] Department of Clinical Social Work's list of facilities offering this level of care within the geographic area requested by the patient (or if unable, by the patient's family).  10/31/2013  Patient/family informed of their freedom to choose among providers that offer the needed level of care, that participate in Medicare, Medicaid or managed care program needed by the patient, have an available bed and are willing to accept the patient.  10/31/2013  Patient/family informed of [facility]' ownership interest in [facility], as well as of the fact that they are under no obligation to receive care at this facility.  PASARR submitted to EDS on 10/31/2013 PASARR number received on 10/31/2013  FL2 transmitted to all facilities in geographic area requested by pt/family on  10/31/2013 FL2 transmitted to all facilities within larger geographic area on   Patient informed that his/her managed care company has contracts with or will negotiate with  certain facilities, including the following:     Patient/family informed of bed offers received:  10/31/2013 Patient chooses bed at [facility] Physician recommends and patient chooses bed at    Patient to be transferred to [facility] Flock on   Patient to be transferred to facility by  Patient and family notified of transfer on  Name of family member notified:    The following physician request were entered in Epic:    Additional Comments:  Eduard Clos, MSW,  [place]

## 2013-10-31 NOTE — Progress Notes (Signed)
Report called to 2W RN 

## 2013-10-31 NOTE — Progress Notes (Signed)
[address]       [place] [address]             [phone]        CARDIOTHORACIC SURGERY PROGRESS NOTE   R2 Days Post-Op Procedure(s) (LRB): MINIMALLY INVASIVE MAZE PROCEDURE (N/A) INTRAOPERATIVE TRANSESOPHAGEAL ECHOCARDIOGRAM (N/A) CLIPPING OF ATRIAL APPENDAGE  Subjective: Feels well although didn't sleep much last night.  Minimal soreness.  No SOB  Objective: Vital signs: BP Readings from Last 1 Encounters:  10/31/13 130/71   Pulse Readings from Last 1 Encounters:  10/31/13 73   Resp Readings from Last 1 Encounters:  10/31/13 24   Temp Readings from Last 1 Encounters:  10/31/13 98.3 F (36.8 C) Oral    Hemodynamics: PAP: (26-52)/(11-23) 32/15 mmHg  Physical Exam:  Rhythm:   sinus  Breath sounds: clear  Heart sounds:  RRR  Incisions:  Clean and dry  Abdomen:  Soft, non-distended, non-tender  Extremities:  Warm, well-perfused    Intake/Output from previous day: 09/03 0701 - 09/04 0700 In: 739.1 [I.V.:619.1] Out: 2135 [Urine:1875; Chest Tube:260] Intake/Output this shift: Total I/O In: -  Out: 25 [Urine:25]  Lab Results:  CBC: Recent Labs  10/30/13 1600 10/30/13 1640 10/31/13 0415  WBC 14.0*  --  13.3*  HGB 12.7* 12.9* 12.5*  HCT 37.6* 38.0* 36.9*  PLT 142*  --  150    BMET:  Recent Labs  10/30/13 0500  10/30/13 1640 10/31/13 0415  NA 138  --  135* 134*  K 4.4  --  4.2 4.3  CL 106  --  104 100  CO2 21  --   --  19  GLUCOSE 155*  --  174* 126*  BUN 18  --  19 22  CREATININE 1.04  < > 1.20 1.23  CALCIUM 7.9*  --   --  8.6  < > = values in this interval not displayed.   CBG (last 3)   Recent Labs  10/30/13 1933 10/30/13 2331 10/31/13 0340  GLUCAP 136* 110* 108*    ABG    Component Value Date/Time   PHART 7.448 10/30/2013 1645   PCO2ART 29.4* 10/30/2013 1645   PO2ART 69.0* 10/30/2013 1645   HCO3 20.5 10/30/2013 1645   TCO2 21 10/30/2013 1645   ACIDBASEDEF 3.0* 10/30/2013 1645   O2SAT 95.0 10/30/2013 1645     CXR: PORTABLE CHEST - 1 VIEW  COMPARISON: One-view chest 10/30/2013.  FINDINGS:  Two right-sided chest tubes remain. There is no pneumothorax. The  heart is enlarged. Interstitial and airspace disease at the right  base is unchanged. A right pleural effusion is suspected with  increased pleural thickening since prior study. The left lung is  clear. Mild cardiac enlargement is stable. The Swan-Ganz catheter  has been removed. The patient has been extubated. The left IJ sheath  remains.  IMPRESSION:  1. Extubation and removal of Swan-Ganz catheter.  2. Stable interstitial and airspace disease at the right base.  3. Pleural thickening versus a fusion.  Electronically Signed  By: Lawrence Santiago M.D.  On: 10/31/2013 07:26   Assessment/Plan: S/P Procedure(s) (LRB): MINIMALLY INVASIVE MAZE PROCEDURE (N/A) INTRAOPERATIVE TRANSESOPHAGEAL ECHOCARDIOGRAM (N/A) CLIPPING OF ATRIAL APPENDAGE  Doing well POD2 Maintaining NSR w/ stable BP off dopamine, milrinone weaned down very low Expected post op acute blood loss anemia, very mild Expected post op volume excess, mild, diuresing some Expected post op atelectasis, mild, R>L Non-ischemic cardiomyopathy, EF 25%  Hypertension  Regular alcohol use  Mobilize  Diuresis  Leave chest tubes 1 more day  D/C milrinone  Convert amiodarone to pre-op oral dose  Restart Toprol XL at 1/2 pre-op dose and increase as tolerated  Restart ACE-I   Lovenox for DVT prophylaxis  Plan to resume Eliquis at discharge but not yet  Transfer step down  Ultimately for SNF placement - patient has already made some arrangements preoperatively at [facility] in [facility] [place] 10/31/2013 8:21 AM

## 2013-10-31 NOTE — Progress Notes (Signed)
CARDIAC REHAB PHASE I   PRE:  Rate/Rhythm: 89 SR in BR    BP: sitting     SaO2: 93 2L  MODE:  Ambulation: to bed from BR   POST:  Rate/Rhythm: 96 SR    BP: sitting      SaO2: 96 2L  Pt in BR and declines walking. Sts he walked from 2S before lunch and that he didn't sleep last night and that he wants to rest. Assisted pt back to bed. Sts he will walk later tonight with RN. Will f/u in am. [phone]   Josephina Shih Marshall CES, ACSM 10/31/2013 3:05 PM

## 2013-10-31 NOTE — Progress Notes (Signed)
Ambulated to 2W24 with wheelchair, monitor and LO2, pt placed in bed, SCD's with pt. CT hooked to 20 cm suction. RN to receive in room.

## 2013-11-01 LAB — CBC
HCT: 36.3 % — ABNORMAL LOW (ref 39.0–52.0)
Hemoglobin: 11.8 g/dL — ABNORMAL LOW (ref 13.0–17.0)
MCH: 31 pg (ref 26.0–34.0)
MCHC: 32.5 g/dL (ref 30.0–36.0)
MCV: 95.3 fL (ref 78.0–100.0)
PLATELETS: 129 10*3/uL — AB (ref 150–400)
RBC: 3.81 MIL/uL — AB (ref 4.22–5.81)
RDW: 15.7 % — AB (ref 11.5–15.5)
WBC: 8.7 10*3/uL (ref 4.0–10.5)

## 2013-11-01 LAB — BASIC METABOLIC PANEL
Anion gap: 13 (ref 5–15)
BUN: 29 mg/dL — ABNORMAL HIGH (ref 6–23)
CO2: 24 mEq/L (ref 19–32)
Calcium: 8.7 mg/dL (ref 8.4–10.5)
Chloride: 100 mEq/L (ref 96–112)
Creatinine, Ser: 1.39 mg/dL — ABNORMAL HIGH (ref 0.50–1.35)
GFR, EST AFRICAN AMERICAN: 57 mL/min — AB (ref >90)
GFR, EST NON AFRICAN AMERICAN: 49 mL/min — AB (ref >90)
Glucose, Bld: 107 mg/dL — ABNORMAL HIGH (ref 70–99)
Potassium: 3.8 mEq/L (ref 3.7–5.3)
SODIUM: 137 meq/L (ref 137–147)

## 2013-11-01 MED ORDER — LISINOPRIL 10 MG PO TABS
10.0000 mg | ORAL_TABLET | Freq: Every day | ORAL | Status: DC
  Administered 2013-11-01 – 2013-11-04 (×4): 10 mg via ORAL
  Filled 2013-11-01 (×5): qty 1

## 2013-11-01 NOTE — Progress Notes (Addendum)
      [address]       [place], [address]             [phone]      3 Days Post-Op Procedure(s) (LRB): MINIMALLY INVASIVE MAZE PROCEDURE (N/A) INTRAOPERATIVE TRANSESOPHAGEAL ECHOCARDIOGRAM (N/A) CLIPPING OF ATRIAL APPENDAGE  Subjective:  Jose Snyder is without complaints.  States he thinks he is doing well.  He is glad he had solid food for breakfast.  Objective: Vital signs in last 24 hours: Temp:  [97.7 F (36.5 C)-98.4 F (36.9 C)] 97.7 F (36.5 C) (09/05 0420) Pulse Rate:  [68-82] 79 (09/05 0420) Cardiac Rhythm:  [-] Normal sinus rhythm (09/04 1940) Resp:  [19-26] 19 (09/05 0420) BP: (116-161)/(57-78) 150/69 mmHg (09/05 0420) SpO2:  [96 %-100 %] 100 % (09/05 0420) Arterial Line BP: (143-152)/(64) 152/64 mmHg (09/04 0900) Weight:  [244 lb 4.3 oz (110.8 kg)] 244 lb 4.3 oz (110.8 kg) (09/05 0420)  Intake/Output from previous day: 09/04 0701 - 09/05 0700 In: 360 [P.O.:360] Out: 875 [Urine:425; Chest Tube:450]  General appearance: alert, cooperative and no distress Heart: irregularly irregular rhythm Lungs: clear to auscultation bilaterally Abdomen: soft, non-tender; bowel sounds normal; no masses,  no organomegaly Extremities: edema trace Wound: clean and dry  Lab Results:  Recent Labs  10/31/13 0415 11/01/13 0359  WBC 13.3* 8.7  HGB 12.5* 11.8*  HCT 36.9* 36.3*  PLT 150 129*   BMET:  Recent Labs  10/31/13 0415 11/01/13 0359  NA 134* 137  K 4.3 3.8  CL 100 100  CO2 19 24  GLUCOSE 126* 107*  BUN 22 29*  CREATININE 1.23 1.39*  CALCIUM 8.6 8.7    PT/INR:  Recent Labs  10/29/13 1532  LABPROT 18.1*  INR 1.50*   ABG    Component Value Date/Time   PHART 7.448 10/30/2013 1645   HCO3 20.5 10/30/2013 1645   TCO2 21 10/30/2013 1645   ACIDBASEDEF 3.0* 10/30/2013 1645   O2SAT 95.0 10/30/2013 1645   CBG (last 3)   Recent Labs  10/30/13 2331 10/31/13 0340 10/31/13 0825  GLUCAP 110* 108* 139*    Assessment/Plan: S/P Procedure(s)  (LRB): MINIMALLY INVASIVE MAZE PROCEDURE (N/A) INTRAOPERATIVE TRANSESOPHAGEAL ECHOCARDIOGRAM (N/A) CLIPPING OF ATRIAL APPENDAGE  1. CV- Episodes of Atrial Fibrillation overnight, HTN SBP in the 150s- will continue Amiodarone, Lopressor, increase Lisinopril to 10 mg daily 2. Pulm- no acute issues, good use of IS will d/c Chest tube today 3.Renal- creatinine mildly elevated, IV Lasix regimen completed, oral regimen starts today 4. Expected Acute Blood Loss Anemia- stable at 11.3 5. Dispo- patient doing well, will leave EPW today, titrate Beta Blocker as tolerated, plan for SNF at discharge, bed has been arranged   LOS: 3 days    BARRETT, ERIN 11/01/2013  Anticipate removing epicardial pacing wires tomorrow We'll start oral anticoagulation after wires are removed Patient doing well currently in sinus rhythm patient examined and medical record reviewed,agree with above note. VAN TRIGT III,Jose Snyder 11/01/2013

## 2013-11-01 NOTE — Progress Notes (Signed)
CARDIAC REHAB PHASE I   PRE:  Rate/Rhythm: 70 afib    BP: sitting 107/52    SaO2: 97 RA  MODE:  Ambulation: 230 ft   POST:  Rate/Rhythm: 103 afib    BP: sitting 120/82     SaO2: 90-91 RA  Pt feels better today. Off O2. DOE with walking, rest x3. HR in afib with controlled rate. To bed to have CT pulled.  [place], [place], ACSM 11/01/2013 9:47 AM

## 2013-11-01 NOTE — Progress Notes (Signed)
Removed Y CT per MD order per hospital policy. Patient tolerated well. Applied Vaseline and gauze to site with hyperfix tape. Will continue to monitor closely. Glade Nurse, RN

## 2013-11-01 NOTE — Progress Notes (Signed)
Patient declined any further walks today. Patient stated that he is not feeling good and wants to walk tomorrow. Will continue to monitor closely.

## 2013-11-02 ENCOUNTER — Inpatient Hospital Stay (HOSPITAL_COMMUNITY): Payer: Medicare Other

## 2013-11-02 MED ORDER — APIXABAN 5 MG PO TABS
5.0000 mg | ORAL_TABLET | Freq: Two times a day (BID) | ORAL | Status: DC
  Administered 2013-11-02 – 2013-11-04 (×4): 5 mg via ORAL
  Filled 2013-11-02 (×5): qty 1

## 2013-11-02 MED ORDER — METOPROLOL TARTRATE 25 MG PO TABS
25.0000 mg | ORAL_TABLET | Freq: Two times a day (BID) | ORAL | Status: DC
  Administered 2013-11-02 – 2013-11-03 (×2): 25 mg via ORAL
  Filled 2013-11-02 (×4): qty 1

## 2013-11-02 NOTE — Progress Notes (Addendum)
      [address]       [place] [address]             [phone]      4 Days Post-Op Procedure(s) (LRB): MINIMALLY INVASIVE MAZE PROCEDURE (N/A) INTRAOPERATIVE TRANSESOPHAGEAL ECHOCARDIOGRAM (N/A) CLIPPING OF ATRIAL APPENDAGE  Subjective:  Jose Snyder has no complaints.  States he continues to feel good.  He is ambulating with minimal difficulty.  + BM Objective: Vital signs in last 24 hours: Temp:  [98 F (36.7 C)-98.4 F (36.9 C)] 98 F (36.7 C) (09/06 0450) Pulse Rate:  [70-76] 76 (09/06 0450) Cardiac Rhythm:  [-] Normal sinus rhythm (09/06 0450) Resp:  [18-20] 20 (09/06 0450) BP: (98-131)/(56-84) 131/67 mmHg (09/06 0450) SpO2:  [97 %-98 %] 97 % (09/06 0450) Weight:  [242 lb 15.2 oz (110.2 kg)] 242 lb 15.2 oz (110.2 kg) (09/06 0500)  Intake/Output from previous day: 09/05 0701 - 09/06 0700 In: 720 [P.O.:720] Out: 650 [Urine:650]  General appearance: alert, cooperative and no distress Heart: irregularly irregular rhythm Lungs: clear to auscultation bilaterally Abdomen: soft, non-tender; bowel sounds normal; no masses,  no organomegaly Extremities: edema minimal  Wound: clean and dry  Lab Results:  Recent Labs  10/31/13 0415 11/01/13 0359  WBC 13.3* 8.7  HGB 12.5* 11.8*  HCT 36.9* 36.3*  PLT 150 129*   BMET:  Recent Labs  10/31/13 0415 11/01/13 0359  NA 134* 137  K 4.3 3.8  CL 100 100  CO2 19 24  GLUCOSE 126* 107*  BUN 22 29*  CREATININE 1.23 1.39*  CALCIUM 8.6 8.7    PT/INR: No results found for this basename: LABPROT, INR,  in the last 72 hours ABG    Component Value Date/Time   PHART 7.448 10/30/2013 1645   HCO3 20.5 10/30/2013 1645   TCO2 21 10/30/2013 1645   ACIDBASEDEF 3.0* 10/30/2013 1645   O2SAT 95.0 10/30/2013 1645   CBG (last 3)   Recent Labs  10/30/13 2331 10/31/13 0340 10/31/13 0825  GLUCAP 110* 108* 139*    Assessment/Plan: S/P Procedure(s) (LRB): MINIMALLY INVASIVE MAZE PROCEDURE (N/A) INTRAOPERATIVE  TRANSESOPHAGEAL ECHOCARDIOGRAM (N/A) CLIPPING OF ATRIAL APPENDAGE  1. CV- In/Out of A.Fib, currently rate controlled- will increase Lopressor, continue Amiodarone, Lisionpril 2. Pulm- no acute issues, good use of IS, CXR with some residual small pleural effusions/atelectasis 3. Renal- volume status stable, continue Lasix for now, will get BMET in AM 4. Deconditioning- SNF at discharge 5. Dispo- patient doing well, will d/c EPW, restart Eliquis tomorrow, plan for d/c to SNF on Tuesday   LOS: 4 days    BARRETT, ERIN 11/02/2013  Patient had a short episodes of atrial fibrillation We'll restart Eliquis twice a day dosing Chest x-ray satisfactory, surgical incision healing well

## 2013-11-02 NOTE — Progress Notes (Signed)
Removed EPW per MD order per hospital policy. Pacing wires intact. Patient tolerated well, VSS. Patient reminded to remain in bed for 1 hour. Will continue to monitor closely. Glade Nurse, RN

## 2013-11-02 NOTE — Progress Notes (Signed)
UR Completed.  Lanetta Figuero Jane [phone] 11/02/2013  

## 2013-11-03 LAB — BASIC METABOLIC PANEL
Anion gap: 18 — ABNORMAL HIGH (ref 5–15)
BUN: 31 mg/dL — ABNORMAL HIGH (ref 6–23)
CALCIUM: 9.1 mg/dL (ref 8.4–10.5)
CO2: 21 mEq/L (ref 19–32)
Chloride: 99 mEq/L (ref 96–112)
Creatinine, Ser: 1.21 mg/dL (ref 0.50–1.35)
GFR calc Af Amer: 67 mL/min — ABNORMAL LOW (ref >90)
GFR, EST NON AFRICAN AMERICAN: 58 mL/min — AB (ref >90)
Glucose, Bld: 171 mg/dL — ABNORMAL HIGH (ref 70–99)
Potassium: 4.1 mEq/L (ref 3.7–5.3)
SODIUM: 138 meq/L (ref 137–147)

## 2013-11-03 MED ORDER — METOPROLOL TARTRATE 12.5 MG HALF TABLET
12.5000 mg | ORAL_TABLET | Freq: Two times a day (BID) | ORAL | Status: DC
  Administered 2013-11-03 – 2013-11-04 (×2): 12.5 mg via ORAL
  Filled 2013-11-03 (×3): qty 1

## 2013-11-03 NOTE — Care Management Note (Signed)
    Page 1 of 1   11/04/2013     3:09:18 PM CARE MANAGEMENT NOTE 11/04/2013  Patient:  Jose Snyder, Jose Snyder   Account Number:  [id number]  Date Initiated:  11/02/2013  Documentation initiated by:  Luz Lex  Subjective/Objective Assessment:   Post op Maze on 9-2     Action/Plan:   Anticipated DC Date:  11/04/2013   Anticipated DC Plan:  [place]  In-house referral  Clinical Social Worker      DC Planning Services  CM consult      Choice offered to / List presented to:             Status of service:  Completed, signed off Medicare Important Message given?  YES (If response is "NO", the following Medicare IM given date fields will be blank) Date Medicare IM given:  11/03/2013 Medicare IM given by:  [facility] Date Additional Medicare IM given:   Additional Medicare IM given by:    Discharge Disposition:  [place]  Per UR Regulation:  Reviewed for med. necessity/level of care/duration of stay  If discussed at [place] of Stay Meetings, dates discussed:   11/04/2013    Comments:  11/04/13 Ellan Lambert, RN, BSN [phone] Pt discharged to SNF today, per CSW arrangements.  11-02-13 Jackquline Bosch, RNBSN [phone] Plan for dc to SNF

## 2013-11-03 NOTE — Progress Notes (Signed)
Patient declined a second walk and stated "I will take another walk today, I promise". Will follow-up after dinner. Glade Nurse, RN

## 2013-11-03 NOTE — Discharge Summary (Signed)
Physician Discharge Summary  Patient ID: Jose Snyder MRN: [id number] DOB/AGE: 04/19/1940 73 y.o.  Admit date: 10/29/2013 Discharge date: 11/03/2013  Admission Diagnoses:  Patient Active Problem List   Diagnosis Date Noted  . Chronic kidney disease   . Penetrating atherosclerotic ulcer of aorta 10/22/2013  . Chronic atrial fibrillation 09/24/2013  . Dyslipidemia 07/15/2013  . CAD (coronary artery disease), native coronary artery 06/28/2013  . Dyspnea on exertion 06/26/2013  . History of prostate cancer 06/26/2013  . Chronic combined systolic and diastolic heart failure 12/24/2534  . NICM (nonischemic cardiomyopathy) with EF 20-25% 06/26/2013    Class: Diagnosis of  . Atrial Fibrillation  06/25/2013  . HTN (hypertension) 06/25/2013   Discharge Diagnoses:   Patient Active Problem List   Diagnosis Date Noted  . Chronic kidney disease   . S/P Maze operation for atrial fibrillation 10/29/2013  . Penetrating atherosclerotic ulcer of aorta 10/22/2013  . Chronic atrial fibrillation 09/24/2013  . Dyslipidemia 07/15/2013  . CAD (coronary artery disease), native coronary artery 06/28/2013  . Dyspnea on exertion 06/26/2013  . History of prostate cancer 06/26/2013  . Chronic combined systolic and diastolic heart failure 64/40/3474  . NICM (nonischemic cardiomyopathy) with EF 20-25% 06/26/2013    Class: Diagnosis of  . Atrial Fibrillation  06/25/2013  . HTN (hypertension) 06/25/2013   Discharged Condition: good  History of Present Illness:    Jose Snyder is a 73 yo white male with known history of HTN, Non-Ischemic Cardiomyopathy, Chronic CHF, and recurrent Persistent Atrial Fibrillation.  The patient noticed last year he developed exertional shortness of breath.  He underwent workup at the [place] in [facility] and he was told this symptoms were attributed to seasonal allergies.  This spring the patient noticed a significant progression in his exertional shortness of breath.  He was hospitalized  in April at which time he was found to be suffering from rapid Atrial Fibrillation and an exacerbation pf his CHF.  He underwent Echocardiogram which showed a reduced EF of 20-25%.  Cardiac catheterization was also performed and showed moderate non-obstructive coronary artery disease.  The patient's Non-Ischemic Cardiomyopathy was felt to be related to Tachycardia.  Therefore, the patient underwent DC Cardioversion which was ultimately unsuccessful and the patient converted to Atrial Fibrillation.  He was loaded with Amiodarone and started on Eliquis.  The patient has been followed by Dr. Stanford Breed since that time.  He again underwent DC cardioversion in June which converted him to NSR, however he again developed Atrial Fibrillation.  Due to this he was referred to Dr. Rayann Heman for further evaluation. It was felt patient would be a poor candidate for radiofrequency ablation due to enlargement of his right Atrium.  Therefore, he was subsequently referred to [facility] for MAZE procedure.  He was evaluated by Dr. Roxy Manns on 09/24/2013 and again on 10/14/2103 at which time it was felt patient would be a candidate for MAZE procedure.  The risks and benefits of the procedure were explained to the patient and he was agreeable to proceed.        Hospital Course:   Jose Snyder presented to [facility] on 10/29/2013.  He was taken to the operating room and underwent Minimally Invasive MAZE procedure with complete Bilateral Atrial Lesion set using Cryothermy and Bipolar Radiofrequency ablation.  He also underwent Clipping on Left Atrial Appendage.  He tolerated the procedure well and was taken to the SICU in stable condition.  During his stay in the SICU the patient was weaned off Dopamine  and Milrinone as tolerated.  He was extubated as tolerated.  He was restarted on IV Amiodarone.  His chest tubes and arterial lines were removed without difficulty.  The patient was maintaining NSR and restarted on a low dose Beta Blocker.  He was  felt medically stable for transfer to the telemetry unit.  The patient continued to progress.  He did convert back into rate controlled Paroxysmal Atrial Fibrillation alternating with NSR.  His pacing wires were removed without difficulty.  He was restarted his home dose of Eliquis.  His beta blocker was increased due to HTN but the patient stated he felt run down and fatigued.  Therefore his dose was reduced.  He is ambulating with minimal difficulty.  He is tolerating a regular diet.  SNF placement has been arranged and should no issues arise we anticipate discharge 11/04/2013.           Treatments: surgery:   Minimally-Invasive Maze Procedure Complete bilateral atrial lesion set using cryothermy and bipolar radiofrequency ablation  Clipping of Left Atrial Appendage (Atricure left atrial clip, size 45 mm  Disposition: SNF  Discharge Medications:  The patient has been discharged on:   1.Beta Blocker:  Yes [x   ]                              No   [   ]                              If No, reason:  2.Ace Inhibitor/ARB: Yes [ x  ]                                     No  [    ]                                     If No, reason:  3.Statin:   Yes [ x  ]                  No  [   ]                  If No, reason:  4.Ecasa:  Yes  [x   ]                  No   [   ]                  If No, reason:     Medication List    STOP taking these medications       metoprolol succinate 50 MG 24 hr tablet  Commonly known as:  TOPROL-XL      TAKE these medications       amiodarone 200 MG tablet  Commonly known as:  PACERONE  Take 200 mg by mouth daily with supper.     apixaban 5 MG Tabs tablet  Commonly known as:  ELIQUIS  Take 1 tablet (5 mg total) by mouth 2 (two) times daily.     aspirin 325 MG EC tablet  Take 1 tablet (325 mg total) by mouth daily.     atorvastatin 20 MG tablet  Commonly known as:  LIPITOR  Take 20 mg by mouth daily with breakfast.     furosemide 40 MG tablet   Commonly known as:  LASIX  Take 40 mg by mouth daily with breakfast. If swelling in wrist takes an extra dose     lisinopril 10 MG tablet  Commonly known as:  PRINIVIL,ZESTRIL  Take 5 mg by mouth daily with breakfast.     Melatonin 10 MG Caps  Take 10 mg by mouth at bedtime as needed (sleep).     metoprolol tartrate 12.5 mg Tabs tablet  Commonly known as:  LOPRESSOR  Take 0.5 tablets (12.5 mg total) by mouth 2 (two) times daily.     omeprazole 20 MG capsule  Commonly known as:  PRILOSEC  Take 20 mg by mouth daily.     oxyCODONE 5 MG immediate release tablet  Commonly known as:  Oxy IR/ROXICODONE  Take 1-2 tablets (5-10 mg total) by mouth every 4 (four) hours as needed for moderate pain.     potassium chloride SA 20 MEQ tablet  Commonly known as:  K-DUR,KLOR-CON  Take 1 tablet (20 mEq total) by mouth daily.     temazepam 15 MG capsule  Commonly known as:  RESTORIL  Take 15 mg by mouth at bedtime as needed.     Vitamin D 2000 UNITS tablet  Take 2,000 Units by mouth daily.         1. Please obtain vital signs at least one time daily 2.Please weigh the patient daily. If he or she continues to gain weight or develops lower extremity edema, contact the office at [phone]. 3. Ambulate patient at least three times daily .      Follow-up Information   Follow up with Rexene Alberts, MD In 2 weeks. (Office will contact you with appointment, please get CXR 1 hour prior to your appointment at [place] located on the first floor of [facility])    Specialty:  Cardiothoracic Surgery   Contact information:   [place] [address] [phone]       Follow up with Thompson Grayer, MD. (Office will contact you with appointment)    Specialty:  Cardiology   Contact information:   [address] [place] [address] [phone]       Signed: Ellwood Handler 11/03/2013, 11:39 AM

## 2013-11-03 NOTE — Progress Notes (Addendum)
      [address]       [place] [address]             [phone]      5 Days Post-Op Procedure(s) (LRB): MINIMALLY INVASIVE MAZE PROCEDURE (N/A) INTRAOPERATIVE TRANSESOPHAGEAL ECHOCARDIOGRAM (N/A) CLIPPING OF ATRIAL APPENDAGE  Subjective:  Mr. Ager states he feels tired and worn out.  He states this is usually what happens when he gets on a higher dose of Lopressor.  He states him and his nurse discussed this last night and he is requesting his lopressor dose be decreased.  + ambulation + BM  Objective: Vital signs in last 24 hours: Temp:  [97.7 F (36.5 C)-98.1 F (36.7 C)] 98.1 F (36.7 C) (09/06 2103) Pulse Rate:  [57-81] 81 (09/07 0553) Cardiac Rhythm:  [-] Normal sinus rhythm (09/07 0553) Resp:  [18-20] 20 (09/07 0553) BP: (103-130)/(50-76) 130/60 mmHg (09/07 0555) SpO2:  [95 %-100 %] 95 % (09/07 0553) Weight:  [244 lb 4.8 oz (110.814 kg)] 244 lb 4.8 oz (110.814 kg) (09/07 0500)  Intake/Output from previous day: 09/06 0701 - 09/07 0700 In: 480 [P.O.:480] Out: 425 [Urine:425]  General appearance: alert, cooperative and no distress Heart: regular rate and rhythm Lungs: clear to auscultation bilaterally Abdomen: soft, non-tender; bowel sounds normal; no masses,  no organomegaly Extremities: edema trace Wound: clean and dyr  Lab Results:  Recent Labs  11/01/13 0359  WBC 8.7  HGB 11.8*  HCT 36.3*  PLT 129*   BMET:  Recent Labs  11/01/13 0359 11/03/13 0535  NA 137 138  K 3.8 4.1  CL 100 99  CO2 24 21  GLUCOSE 107* 171*  BUN 29* 31*  CREATININE 1.39* 1.21  CALCIUM 8.7 9.1    PT/INR: No results found for this basename: LABPROT, INR,  in the last 72 hours ABG    Component Value Date/Time   PHART 7.448 10/30/2013 1645   HCO3 20.5 10/30/2013 1645   TCO2 21 10/30/2013 1645   ACIDBASEDEF 3.0* 10/30/2013 1645   O2SAT 95.0 10/30/2013 1645   CBG (last 3)   Recent Labs  10/31/13 0825  GLUCAP 139*    Assessment/Plan: S/P Procedure(s)  (LRB): MINIMALLY INVASIVE MAZE PROCEDURE (N/A) INTRAOPERATIVE TRANSESOPHAGEAL ECHOCARDIOGRAM (N/A) CLIPPING OF ATRIAL APPENDAGE  1. CV- continues to go in/out of Atrial Fibrillation, currently NSR- will decrease Lopressor to 12.5 mg BID since patient feels poorly with increase, continue Amiodarone, Eliquis 2. Pulm- no acute issues, good use of IS 3. Renal- creatinine trending down, weight is stable continue lasix for now 4. Deconditioning- SNF bed is arranged 5. Dispo- patient is medically stable, will decrease Lopressor dose back to 12.5 since patient feels poorly with increased dose, plan for SNF in AM pending no issues arise overnight   LOS: 5 days    Ahmed Prima, ERIN 11/03/2013  patient examined and medical record reviewed,agree with above note. VAN TRIGT III,Romey Cohea 11/03/2013

## 2013-11-03 NOTE — Progress Notes (Signed)
Patient ambulated 150 ft on room air using front wheel walker tolerated well.

## 2013-11-03 NOTE — Discharge Instructions (Signed)
Thoracotomy, Care After °Refer to this sheet in the next few weeks. These instructions provide you with information on caring for yourself after your procedure. Your health care provider may also give you more specific instructions. Your treatment has been planned according to current medical practices, but problems sometimes occur. Call your health care provider if you have any problems or questions after your procedure. °WHAT TO EXPECT AFTER YOUR PROCEDURE °After your procedure, it is typical to have the following sensations: °· You may feel pain at the incision site. °· You may be constipated from the pain medicine given and the change in your level of activity. °· You may feel extremely tired. °HOME CARE INSTRUCTIONS °· Take over-the-counter or prescription medicines for pain, discomfort, or fever only as directed by your health care provider. It is very important to take pain relieving medicine before your pain becomes severe. You will be able to breathe and cough more comfortably if your pain is well controlled. °· Take deep breaths. Deep breathing helps to keep your lungs inflated and protects against a lung infection (pneumonia). °· Cough frequently. Even though coughing may cause discomfort, coughing is important to clear mucus (phlegm) and expand your lungs. Coughing helps prevent pneumonia. If it hurts to cough, hold a pillow against your chest when you cough. This may help with the discomfort. °· Continue to use an incentive spirometer as directed. The use of an incentive spirometer helps to keep your lungs inflated and protects against pneumonia. °· Change the bandages over your incision as needed or as directed by your health care provider. °· Remove the bandages over your chest tube site as directed by your health care provider. °· Resume your normal diet as directed. It is important to have adequate protein, calories, vitamins, and minerals to promote healing. °· Prevent constipation. °¨ Eat  high-fiber foods such as whole grain cereals and breads, brown rice, beans, and fresh fruits and vegetables. °¨ Drink enough water and fluids to keep your urine clear or pale yellow. Avoid drinking beverages containing caffeine. Beverages containing caffeine can cause dehydration and harden your stool. °¨ Talk to your health care provider about taking a stool softener or laxative. °· Avoid lifting until you are instructed otherwise. °· Do not drive until directed by your health care provider.  Do not drive while taking pain medicines (narcotics). °· Do not bathe, swim, or use a hot tub until directed by your health care provider. You may shower instead. Gently wash the area of your incision with water and soap as directed. Do not use anything else to clean your incision except as directed by your health care provider. °· Do not use any tobacco products including cigarettes, chewing tobacco, or electronic cigarettes. °· Avoid secondhand smoke. °· Schedule an appointment for stitch (suture) or staple removal as directed. °· Schedule and attend all follow-up visits as directed by your health care provider. It is important to keep all your appointments. °· Participate in pulmonary rehabilitation as directed by your health care provider. °· Do not travel by airplane for 2 weeks after your chest tube is removed. °SEEK MEDICAL CARE IF: °· You are bleeding from your wounds. °· Your heartbeat seems irregular. °· You have redness, swelling, or increasing pain in the wounds. °· There is pus coming from your wounds. °· There is a bad smell coming from the wound or dressing. °· You have a fever or chills. °· You have nausea or are vomiting. °· You have muscle aches. °SEEK   IMMEDIATE MEDICAL CARE IF: °· You have a rash. °· You have difficulty breathing. °· You have a reaction or side effect to medicines given. °· You have persistent nausea. °· You have lightheadedness or feel faint. °· You have shortness of breath or chest  pain. °· You have persistent pain. °Document Released: 07/29/2010 Document Revised: 02/18/2013 Document Reviewed: 10/02/2012 °ExitCare® Patient Information ©2015 [employer]. This information is not intended to replace advice given to you by your health care provider. Make sure you discuss any questions you have with your health care provider. ° °

## 2013-11-04 DIAGNOSIS — Z8546 Personal history of malignant neoplasm of prostate: Secondary | ICD-10-CM | POA: Diagnosis not present

## 2013-11-04 DIAGNOSIS — F449 Dissociative and conversion disorder, unspecified: Secondary | ICD-10-CM | POA: Diagnosis not present

## 2013-11-04 DIAGNOSIS — R262 Difficulty in walking, not elsewhere classified: Secondary | ICD-10-CM | POA: Diagnosis not present

## 2013-11-04 DIAGNOSIS — I5042 Chronic combined systolic (congestive) and diastolic (congestive) heart failure: Secondary | ICD-10-CM | POA: Diagnosis not present

## 2013-11-04 DIAGNOSIS — I129 Hypertensive chronic kidney disease with stage 1 through stage 4 chronic kidney disease, or unspecified chronic kidney disease: Secondary | ICD-10-CM | POA: Diagnosis not present

## 2013-11-04 DIAGNOSIS — I428 Other cardiomyopathies: Secondary | ICD-10-CM | POA: Diagnosis not present

## 2013-11-04 DIAGNOSIS — I509 Heart failure, unspecified: Secondary | ICD-10-CM | POA: Diagnosis not present

## 2013-11-04 DIAGNOSIS — I2510 Atherosclerotic heart disease of native coronary artery without angina pectoris: Secondary | ICD-10-CM | POA: Diagnosis not present

## 2013-11-04 DIAGNOSIS — M6250 Muscle wasting and atrophy, not elsewhere classified, unspecified site: Secondary | ICD-10-CM | POA: Diagnosis not present

## 2013-11-04 DIAGNOSIS — M6281 Muscle weakness (generalized): Secondary | ICD-10-CM | POA: Diagnosis not present

## 2013-11-04 DIAGNOSIS — I4891 Unspecified atrial fibrillation: Secondary | ICD-10-CM | POA: Diagnosis not present

## 2013-11-04 DIAGNOSIS — E785 Hyperlipidemia, unspecified: Secondary | ICD-10-CM | POA: Diagnosis not present

## 2013-11-04 DIAGNOSIS — N189 Chronic kidney disease, unspecified: Secondary | ICD-10-CM | POA: Diagnosis not present

## 2013-11-04 MED ORDER — METOPROLOL TARTRATE 12.5 MG HALF TABLET: 12.5000 mg | ORAL_TABLET | Freq: Two times a day (BID) | ORAL | Status: DC

## 2013-11-04 MED ORDER — OXYCODONE HCL 5 MG PO TABS: 5.0000 mg | ORAL_TABLET | ORAL | Status: DC | PRN

## 2013-11-04 MED ORDER — ASPIRIN 325 MG PO TBEC: 325.0000 mg | DELAYED_RELEASE_TABLET | Freq: Every day | ORAL | Status: DC

## 2013-11-04 NOTE — Progress Notes (Signed)
IV and tele removed, pt to call family and make aware of transfer to SNF, pt states he has a ride to the SNF, AVS given to pt with follow-up appts per his request Rickard Rhymes, RN

## 2013-11-04 NOTE — Clinical Social Work Placement (Signed)
Clinical Social Work Department CLINICAL SOCIAL WORK PLACEMENT NOTE 11/04/2013  Patient:  Jose Snyder, Jose Snyder  Account Number:  [id number] Admit date:  10/29/2013  Clinical Social Worker:  Daiva Huge  Date/time:  10/31/2013 11:13 AM  Clinical Social Work is seeking post-discharge placement for this patient at the following level of care:   SKILLED NURSING   (*CSW will update this form in Epic as items are completed)   10/31/2013  Patient/family provided with [facility] Department of Clinical Social Work's list of facilities offering this level of care within the geographic area requested by the patient (or if unable, by the patient's family).  10/31/2013  Patient/family informed of their freedom to choose among providers that offer the needed level of care, that participate in Medicare, Medicaid or managed care program needed by the patient, have an available bed and are willing to accept the patient.  10/31/2013  Patient/family informed of [facility]' ownership interest in [facility], as well as of the fact that they are under no obligation to receive care at this facility.  PASARR submitted to EDS on 10/31/2013 PASARR number received on 10/31/2013  FL2 transmitted to all facilities in geographic area requested by pt/family on  10/31/2013 FL2 transmitted to all facilities within larger geographic area on   Patient informed that his/her managed care company has contracts with or will negotiate with  certain facilities, including the following:     Patient/family informed of bed offers received:  10/31/2013 Patient chooses bed at [facility] Physician recommends and patient chooses bed at    Patient to be transferred to [facility] on  11/04/2013 Patient to be transferred to facility by [facility] personal vehicle Patient and family notified of transfer on 11/04/2013 Name of family member notified:  Langley Gauss  The following physician request were entered in  Epic:   Additional Comments: Per MD patient ready for DC to [facility]. RN, patient, patient's family, and facility notified of DC. RN given number for report. DC packet on chart. Patient to be transported by friend. CSW signing off.    Liz Beach MSW, [place], [place], [phone]

## 2013-11-04 NOTE — Progress Notes (Signed)
Report called to Darryl Lent, RN

## 2013-11-04 NOTE — Progress Notes (Addendum)
      [address]       [place] [address]             [phone]      6 Days Post-Op Procedure(s) (LRB): MINIMALLY INVASIVE MAZE PROCEDURE (N/A) INTRAOPERATIVE TRANSESOPHAGEAL ECHOCARDIOGRAM (N/A) CLIPPING OF ATRIAL APPENDAGE Subjective: Feels much better today  Objective: Vital signs in last 24 hours: Temp:  [97.9 F (36.6 C)-98.2 F (36.8 C)] 98.2 F (36.8 C) (09/08 0401) Pulse Rate:  [72-80] 78 (09/08 0401) Cardiac Rhythm:  [-] Atrial fibrillation;Normal sinus rhythm (09/07 1955) Resp:  [18-19] 18 (09/08 0401) BP: (131-158)/(58-78) 158/78 mmHg (09/08 0401) SpO2:  [95 %-98 %] 98 % (09/08 0401) Weight:  [244 lb (110.678 kg)] 244 lb (110.678 kg) (09/08 0401)  Hemodynamic parameters for last 24 hours:    Intake/Output from previous day: 09/07 0701 - 09/08 0700 In: 480 [P.O.:480] Out: 575 [Urine:575] Intake/Output this shift:    General appearance: alert, cooperative and no distress Heart: regular rate and rhythm Lungs: clear to auscultation bilaterally Abdomen: benign Extremities: + BLE edema Wound: incis healing well  Lab Results: No results found for this basename: WBC, HGB, HCT, PLT,  in the last 72 hours BMET:  Recent Labs  11/03/13 0535  NA 138  K 4.1  CL 99  CO2 21  GLUCOSE 171*  BUN 31*  CREATININE 1.21  CALCIUM 9.1    PT/INR: No results found for this basename: LABPROT, INR,  in the last 72 hours ABG    Component Value Date/Time   PHART 7.448 10/30/2013 1645   HCO3 20.5 10/30/2013 1645   TCO2 21 10/30/2013 1645   ACIDBASEDEF 3.0* 10/30/2013 1645   O2SAT 95.0 10/30/2013 1645   CBG (last 3)  No results found for this basename: GLUCAP,  in the last 72 hours  Meds Scheduled Meds: . amiodarone  200 mg Oral Q supper  . apixaban  5 mg Oral BID  . aspirin EC  325 mg Oral Daily  . atorvastatin  20 mg Oral Q breakfast  . bisacodyl  10 mg Oral Daily   Or  . bisacodyl  10 mg Rectal Daily  . docusate sodium  200 mg Oral Daily  .  furosemide  40 mg Oral Q breakfast  . lisinopril  10 mg Oral Q breakfast  . metoprolol tartrate  12.5 mg Oral BID  . pantoprazole  40 mg Oral Daily  . potassium chloride SA  20 mEq Oral Daily  . sodium chloride  3 mL Intravenous Q12H  . sodium chloride  3 mL Intravenous Q12H   Continuous Infusions:  PRN Meds:.sodium chloride, metoprolol, ondansetron (ZOFRAN) IV, oxyCODONE, sodium chloride, sodium chloride, temazepam, traMADol  Xrays No results found.  Assessment/Plan: S/P Procedure(s) (LRB): MINIMALLY INVASIVE MAZE PROCEDURE (N/A) INTRAOPERATIVE TRANSESOPHAGEAL ECHOCARDIOGRAM (N/A) CLIPPING OF ATRIAL APPENDAGE   1 appears to be clinically stable to tx to SNF for further rehab, intermit. afib persists with good HR control. He may benefit from further BP control as outpatient but will leave on current meds for now as is still adjusting to post-op state   LOS: 6 days    Snyder,Jose E 11/04/2013  I have seen and examined the patient and agree with the assessment and plan as outlined.  Ready for d/c to SNF today.  Snyder,Jose H 11/04/2013 7:52 AM

## 2013-11-04 NOTE — Progress Notes (Signed)
[phone] Pt for discharge today. Reviewed CHF packet with zones and when to call cardiologist due to low EF. Gave low sodium restriction diets and encouraged 2000mg  restriction. Discussed ex ed, IS and discussed CRP 2 with pt. Pt wants to discuss program with cardiologist and surgeon before agreeing. Graylon Good RN BSN 11/04/2013 10:10 AM

## 2013-11-05 DIAGNOSIS — R262 Difficulty in walking, not elsewhere classified: Secondary | ICD-10-CM | POA: Diagnosis not present

## 2013-11-05 DIAGNOSIS — M6281 Muscle weakness (generalized): Secondary | ICD-10-CM | POA: Diagnosis not present

## 2013-11-06 DIAGNOSIS — M6281 Muscle weakness (generalized): Secondary | ICD-10-CM | POA: Diagnosis not present

## 2013-11-06 DIAGNOSIS — M6250 Muscle wasting and atrophy, not elsewhere classified, unspecified site: Secondary | ICD-10-CM | POA: Diagnosis not present

## 2013-11-06 DIAGNOSIS — I509 Heart failure, unspecified: Secondary | ICD-10-CM | POA: Diagnosis not present

## 2013-11-06 DIAGNOSIS — I4891 Unspecified atrial fibrillation: Secondary | ICD-10-CM | POA: Diagnosis not present

## 2013-11-06 DIAGNOSIS — E785 Hyperlipidemia, unspecified: Secondary | ICD-10-CM | POA: Diagnosis not present

## 2013-11-10 DIAGNOSIS — N189 Chronic kidney disease, unspecified: Secondary | ICD-10-CM | POA: Diagnosis not present

## 2013-11-10 DIAGNOSIS — I428 Other cardiomyopathies: Secondary | ICD-10-CM | POA: Diagnosis not present

## 2013-11-10 DIAGNOSIS — I25119 Atherosclerotic heart disease of native coronary artery with unspecified angina pectoris: Secondary | ICD-10-CM | POA: Diagnosis not present

## 2013-11-10 DIAGNOSIS — C61 Malignant neoplasm of prostate: Secondary | ICD-10-CM | POA: Diagnosis not present

## 2013-11-10 DIAGNOSIS — I5042 Chronic combined systolic (congestive) and diastolic (congestive) heart failure: Secondary | ICD-10-CM | POA: Diagnosis not present

## 2013-11-10 DIAGNOSIS — I4891 Unspecified atrial fibrillation: Secondary | ICD-10-CM | POA: Diagnosis not present

## 2013-11-10 DIAGNOSIS — I129 Hypertensive chronic kidney disease with stage 1 through stage 4 chronic kidney disease, or unspecified chronic kidney disease: Secondary | ICD-10-CM | POA: Diagnosis not present

## 2013-11-10 DIAGNOSIS — Z48812 Encounter for surgical aftercare following surgery on the circulatory system: Secondary | ICD-10-CM | POA: Diagnosis not present

## 2013-11-11 DIAGNOSIS — I25119 Atherosclerotic heart disease of native coronary artery with unspecified angina pectoris: Secondary | ICD-10-CM | POA: Diagnosis not present

## 2013-11-11 DIAGNOSIS — I5042 Chronic combined systolic (congestive) and diastolic (congestive) heart failure: Secondary | ICD-10-CM | POA: Diagnosis not present

## 2013-11-11 DIAGNOSIS — Z48812 Encounter for surgical aftercare following surgery on the circulatory system: Secondary | ICD-10-CM | POA: Diagnosis not present

## 2013-11-11 DIAGNOSIS — N189 Chronic kidney disease, unspecified: Secondary | ICD-10-CM | POA: Diagnosis not present

## 2013-11-11 DIAGNOSIS — I4891 Unspecified atrial fibrillation: Secondary | ICD-10-CM | POA: Diagnosis not present

## 2013-11-11 DIAGNOSIS — I129 Hypertensive chronic kidney disease with stage 1 through stage 4 chronic kidney disease, or unspecified chronic kidney disease: Secondary | ICD-10-CM | POA: Diagnosis not present

## 2013-11-12 ENCOUNTER — Encounter: Payer: Self-pay | Admitting: Cardiology

## 2013-11-12 ENCOUNTER — Ambulatory Visit: Payer: Medicare Other | Admitting: Cardiology

## 2013-11-12 ENCOUNTER — Ambulatory Visit (HOSPITAL_BASED_OUTPATIENT_CLINIC_OR_DEPARTMENT_OTHER)
Admission: RE | Admit: 2013-11-12 | Discharge: 2013-11-12 | Disposition: A | Discharge status: Home / Self Care | Payer: Medicare Other | Source: Ambulatory Visit | Attending: Cardiology | Admitting: Cardiology

## 2013-11-12 ENCOUNTER — Ambulatory Visit (INDEPENDENT_AMBULATORY_CARE_PROVIDER_SITE_OTHER): Payer: Medicare Other | Admitting: Cardiology

## 2013-11-12 VITALS — BP 135/80 | HR 79 | Wt 239.0 lb

## 2013-11-12 DIAGNOSIS — I4891 Unspecified atrial fibrillation: Secondary | ICD-10-CM | POA: Diagnosis not present

## 2013-11-12 DIAGNOSIS — I2510 Atherosclerotic heart disease of native coronary artery without angina pectoris: Secondary | ICD-10-CM | POA: Diagnosis not present

## 2013-11-12 DIAGNOSIS — R059 Cough, unspecified: Secondary | ICD-10-CM | POA: Diagnosis present

## 2013-11-12 DIAGNOSIS — J90 Pleural effusion, not elsewhere classified: Secondary | ICD-10-CM | POA: Insufficient documentation

## 2013-11-12 DIAGNOSIS — R05 Cough: Secondary | ICD-10-CM | POA: Diagnosis present

## 2013-11-12 LAB — BASIC METABOLIC PANEL WITH GFR
BUN: 13 mg/dL (ref 6–23)
CO2: 29 mEq/L (ref 19–32)
Calcium: 9.3 mg/dL (ref 8.4–10.5)
Chloride: 95 mEq/L — ABNORMAL LOW (ref 96–112)
Creat: 1.21 mg/dL (ref 0.50–1.35)
GFR, Est African American: 69 mL/min
GFR, Est Non African American: 59 mL/min — ABNORMAL LOW
GLUCOSE: 93 mg/dL (ref 70–99)
POTASSIUM: 6.1 meq/L — AB (ref 3.5–5.3)
Sodium: 134 mEq/L — ABNORMAL LOW (ref 135–145)

## 2013-11-12 MED ORDER — METOPROLOL SUCCINATE ER 50 MG PO TB24: 50.0000 mg | ORAL_TABLET | Freq: Every day | ORAL | Status: DC

## 2013-11-12 MED ORDER — CEPHALEXIN 500 MG PO CAPS: 500.0000 mg | ORAL_CAPSULE | Freq: Two times a day (BID) | ORAL | Status: DC

## 2013-11-12 MED ORDER — ASPIRIN EC 81 MG PO TBEC: 81.0000 mg | DELAYED_RELEASE_TABLET | Freq: Every day | ORAL | Status: DC

## 2013-11-12 NOTE — Assessment & Plan Note (Signed)
Patient with history of nonischemic cardiomyopathy. Question tachycardia mediated. Patient remains in sinus rhythm. Plan repeat echocardiogram in 3 months. If ejection fraction less than 35% would need to consider ICD. Continue ACE inhibitor. Discontinue metoprolol and treat with Toprol 50 mg daily. Titrate medications as tolerated by pulse and blood pressure.

## 2013-11-12 NOTE — Patient Instructions (Addendum)
Your physician recommends that you schedule a follow-up appointment in: [place]  STOP METOPROLOL TART  START METOPROLOL SUCC ER 50 MG ONCE DAILY  Your physician recommends that you HAVE LAB WORK TODAY  START KEFLEX 500 MG ONE TABLET TWICE DAILY X 7 DAYS

## 2013-11-12 NOTE — Assessment & Plan Note (Addendum)
Continue statin. Decrease aspirin to 81 mg daily.

## 2013-11-12 NOTE — Assessment & Plan Note (Signed)
Continue present blood pressure medications. 

## 2013-11-12 NOTE — Progress Notes (Signed)
HPI: FU atrial fibrillation. He was admitted 4/15 with AFib with RVR. CEs were negative. Inpatient Myoview was abnormal with low EF and apical scar. Echo confirmed EF 20-25%. LHC demonstrated mod non-obs CAD. Most significant lesion was a mid D1 with 75% stenosis. DCM was felt to be out of proportion to CAD (NICM). He was diuresed with IV Lasix. He was placed on amiodarone, metoprolol, Apixaban. Patient Had a cardioversion but did not hold sinus rhythm. He was very symptomatic. He was seen by Dr. Rayann Heman and referred to Dr. Roxy Manns for surgical maze. Carotid Dopplers August 2015 showed 1-39% bilateral stenosis. Echocardiogram August 2015 showed ejection fraction 25%, severe left atrial enlargement and mild mitral regurgitation. CTA August 2015 showed possible small focal dissection or penetrating ulcer in the descending thoracic aorta. No evidence of abdominal aortic aneurysm. Patient underwent maze procedure with clipping of left atrial appendage on October 29, 2013. Since then, The patient's dyspnea on exertion and fatigue are slowly improved no orthopnea or PND. Occasional minimal pedal edema. No chest pain or syncope. No fevers or chills. He has had minimal clear drainage from his incision site. Studies:  - LHC (06/27/13): Mid LAD 40%, mid D1 75%, mid CFX 50-60%, mid RCA 50%, EF 20%, LVEDP 24 mmHg.     Current Outpatient Prescriptions  Medication Sig Dispense Refill  . amiodarone (PACERONE) 200 MG tablet Take 200 mg by mouth daily with supper.      Marland Kitchen apixaban (ELIQUIS) 5 MG TABS tablet Take 1 tablet (5 mg total) by mouth 2 (two) times daily.  60 tablet  11  . aspirin EC 325 MG EC tablet Take 1 tablet (325 mg total) by mouth daily.      Marland Kitchen atorvastatin (LIPITOR) 20 MG tablet Take 20 mg by mouth daily with breakfast.      . Cholecalciferol (VITAMIN D) 2000 UNITS tablet Take 2,000 Units by mouth daily.      . furosemide (LASIX) 40 MG tablet Take 40 mg by mouth daily with breakfast. If swelling in  wrist takes an extra dose      . lisinopril (PRINIVIL,ZESTRIL) 10 MG tablet Take 5 mg by mouth daily with breakfast.       . Melatonin 10 MG CAPS Take 10 mg by mouth at bedtime as needed (sleep).      . metoprolol tartrate (LOPRESSOR) 12.5 mg TABS tablet Take 0.5 tablets (12.5 mg total) by mouth 2 (two) times daily.      Marland Kitchen omeprazole (PRILOSEC) 20 MG capsule Take 20 mg by mouth daily.      Marland Kitchen oxyCODONE (OXY IR/ROXICODONE) 5 MG immediate release tablet Take 1-2 tablets (5-10 mg total) by mouth every 4 (four) hours as needed for moderate pain.  50 tablet  0  . potassium chloride SA (K-DUR,KLOR-CON) 20 MEQ tablet Take 1 tablet (20 mEq total) by mouth daily.  30 tablet  11  . temazepam (RESTORIL) 15 MG capsule Take 15 mg by mouth at bedtime as needed.        No current facility-administered medications for this visit.     Past Medical History  Diagnosis Date  . Hypertension   . Obesity   . GERD (gastroesophageal reflux disease)   . Chronic combined systolic and diastolic heart failure 06/12/6061    a.  Echo (06/25/13):  EF 20-25%, diff HK, restrictive physio, mild MR, mod to severe LAE, mild RVE, mildly reduced RVSF, mod to severe RAE  . NICM (nonischemic cardiomyopathy) with EF  20-25% 06/26/2013  . Atrial Fibrillation  06/25/2013  . Dyslipidemia 07/15/2013  . CAD (coronary artery disease), native coronary artery 06/28/2013    a. LHC (06/27/13):  Mid LAD 40%, mid D1 75%, mid CFX 50-60%, mid RCA 50%, EF 20%, LVEDP 24 mmHg.  Marland Kitchen Penetrating atherosclerotic ulcer of aorta 10/22/2013    Small penetrating ulcer of descending thoracic aorta discovered on routine CTA  . Shortness of breath     seen by Mykai Wendorf, & low energy   . CHF (congestive heart failure)   . Dysrhythmia     a-fib  . Anxiety   . Arthritis     R hand- OA  . Prostate cancer     watchful waiting, no treatment so far  . S/P Maze operation for atrial fibrillation 10/29/2013    Complete bilateral atrial lesion set using cryothermy and  bipolar radiofrequency ablation with clipping of LA appendage via right mini thoracotomy  . Chronic kidney disease     Past Surgical History  Procedure Laterality Date  . Medial partial knee replacement Left 1990's?  . Transurethral resection of prostate  2010  . Tee without cardioversion N/A 06/30/2013    Procedure: TRANSESOPHAGEAL ECHOCARDIOGRAM (TEE) ;  Surgeon: [name], MD;  Location: [facility];  Service: Cardiovascular;  Laterality: N/A;  . Cardioversion N/A 06/30/2013    Procedure: CARDIOVERSION;  Surgeon: [name], MD;  Location: [facility];  Service: Cardiovascular;  Laterality: N/A;  . Cardioversion N/A 07/31/2013    Procedure: CARDIOVERSION;  Surgeon: Thayer Headings, MD;  Location: [facility];  Service: Cardiovascular;  Laterality: N/A;  . Cardiac catheterization  06/2013  . Minimally invasive maze procedure N/A 10/29/2013    Procedure: MINIMALLY INVASIVE MAZE PROCEDURE;  Surgeon: Rexene Alberts, MD;  Location: [facility];  Service: Open Heart Surgery;  Laterality: N/A;  . Intraoperative transesophageal echocardiogram N/A 10/29/2013    Procedure: INTRAOPERATIVE TRANSESOPHAGEAL ECHOCARDIOGRAM;  Surgeon: Rexene Alberts, MD;  Location: [facility];  Service: Open Heart Surgery;  Laterality: N/A;  . Clipping of atrial appendage  10/29/2013    Procedure: CLIPPING OF ATRIAL APPENDAGE;  Surgeon: Rexene Alberts, MD;  Location: [facility];  Service: Open Heart Surgery;;    History   Social History  . Marital Status: Widowed    Spouse Name: N/A    Number of Children: N/A  . Years of Education: N/A   Occupational History  . Not on file.   Social History Main Topics  . Smoking status: Never Smoker   . Smokeless tobacco: Never Used     Comment: 06/25/2013 "smoked very light; years and years ago"  . Alcohol Use: 8.4 oz/week    7 Glasses of wine, 7 Shots of liquor per week  . Drug Use: No  . Sexual Activity: Not Currently   Other Topics Concern  . Not on file   Social History  Narrative   He lives in [place]. He is widowed. Wife passed of breast cancer last year. No children.     ROS: no fevers or chills, productive cough, hemoptysis, dysphasia, odynophagia, melena, hematochezia, dysuria, hematuria, rash, seizure activity, orthopnea, PND, claudication. Remaining systems are negative.  Physical Exam: Well-developed well-nourished in no acute distress.  Skin is warm and dry.  HEENT is normal.  Neck is supple.  Chest is clear to auscultation with normal expansion. Right chest incision with sutures in place. There is mild erythema surrounding the sutures but no drainage. No tenderness. Cardiovascular exam is regular rate  and rhythm.  Abdominal exam nontender or distended. No masses palpated. Extremities show no edema. neuro grossly intact  ECG Sinus rhythm at a rate of 79. Left posterior fascicular block, nonspecific ST changes.

## 2013-11-12 NOTE — Assessment & Plan Note (Signed)
Patient remains in sinus rhythm status post surgical maze procedure. Continue amiodarone for now. Hopefully this can be discontinued in approximately 12 weeks. Continue apixaban. Check potassium and renal function. He has had mild clear drainage from surgical incision site. There is mild erythema at the site. I will give a short course of Keflex (500 twice a day for 7 days). He is scheduled to see Dr. Roxy Manns back early next week.

## 2013-11-12 NOTE — Assessment & Plan Note (Signed)
Patient appears to be euvolemic on examination. He is taking Lasix occasionally twice a day for excess volume. I have asked him to continue Lasix 40 mg daily. Check potassium and renal function. If renal function stable I will add spironolactone 25 mg daily given his cardiomyopathy.

## 2013-11-12 NOTE — Assessment & Plan Note (Signed)
Continue statin. 

## 2013-11-13 ENCOUNTER — Telehealth: Payer: Self-pay | Admitting: Cardiology

## 2013-11-13 ENCOUNTER — Telehealth: Payer: Self-pay | Admitting: *Deleted

## 2013-11-13 DIAGNOSIS — E875 Hyperkalemia: Secondary | ICD-10-CM | POA: Diagnosis not present

## 2013-11-13 LAB — BASIC METABOLIC PANEL
BUN: 14 mg/dL (ref 6–23)
CO2: 29 mEq/L (ref 19–32)
Calcium: 8.7 mg/dL (ref 8.4–10.5)
Chloride: 95 mEq/L — ABNORMAL LOW (ref 96–112)
Creat: 1.27 mg/dL (ref 0.50–1.35)
Glucose, Bld: 105 mg/dL — ABNORMAL HIGH (ref 70–99)
Potassium: 4.4 mEq/L (ref 3.5–5.3)
Sodium: 133 mEq/L — ABNORMAL LOW (ref 135–145)

## 2013-11-13 NOTE — Telephone Encounter (Signed)
Dr. Stanford Breed called reporting K+ was 6.1 and to hold K+ and Lisinopril.  Called patient he has already taken his meds this AM but instructed to go to the lab for STAT BMP this AM and to not take K+ or Lisinopril tomorrow until instructed to do so.  Patient voiced understanding.  Stat Order placed.

## 2013-11-13 NOTE — Telephone Encounter (Signed)
[facility] called with lab results.  K+ 4.4.  Told to send to Dr. Reesa Chew.

## 2013-11-14 ENCOUNTER — Other Ambulatory Visit: Payer: Self-pay | Admitting: Thoracic Surgery (Cardiothoracic Vascular Surgery)

## 2013-11-14 DIAGNOSIS — G4721 Circadian rhythm sleep disorder, delayed sleep phase type: Secondary | ICD-10-CM

## 2013-11-14 NOTE — Telephone Encounter (Signed)
Results in EPIC.

## 2013-11-15 DIAGNOSIS — I25119 Atherosclerotic heart disease of native coronary artery with unspecified angina pectoris: Secondary | ICD-10-CM | POA: Diagnosis not present

## 2013-11-15 DIAGNOSIS — I129 Hypertensive chronic kidney disease with stage 1 through stage 4 chronic kidney disease, or unspecified chronic kidney disease: Secondary | ICD-10-CM | POA: Diagnosis not present

## 2013-11-15 DIAGNOSIS — Z48812 Encounter for surgical aftercare following surgery on the circulatory system: Secondary | ICD-10-CM | POA: Diagnosis not present

## 2013-11-15 DIAGNOSIS — I4891 Unspecified atrial fibrillation: Secondary | ICD-10-CM | POA: Diagnosis not present

## 2013-11-15 DIAGNOSIS — N189 Chronic kidney disease, unspecified: Secondary | ICD-10-CM | POA: Diagnosis not present

## 2013-11-15 DIAGNOSIS — I5042 Chronic combined systolic (congestive) and diastolic (congestive) heart failure: Secondary | ICD-10-CM | POA: Diagnosis not present

## 2013-11-17 ENCOUNTER — Ambulatory Visit (INDEPENDENT_AMBULATORY_CARE_PROVIDER_SITE_OTHER): Payer: Self-pay | Admitting: Thoracic Surgery (Cardiothoracic Vascular Surgery)

## 2013-11-17 ENCOUNTER — Encounter: Payer: Self-pay | Admitting: Thoracic Surgery (Cardiothoracic Vascular Surgery)

## 2013-11-17 VITALS — BP 137/78 | HR 88 | Resp 16 | Ht 74.0 in | Wt 239.0 lb

## 2013-11-17 DIAGNOSIS — I4891 Unspecified atrial fibrillation: Secondary | ICD-10-CM

## 2013-11-17 DIAGNOSIS — Z9889 Other specified postprocedural states: Secondary | ICD-10-CM

## 2013-11-17 DIAGNOSIS — Z8679 Personal history of other diseases of the circulatory system: Secondary | ICD-10-CM

## 2013-11-17 NOTE — Progress Notes (Signed)
[address]       [place] [address]             [phone]     CARDIOTHORACIC SURGERY OFFICE NOTE  Referring Provider is Thompson Grayer, MD Primary Cardiologist is Lelon Perla, MD PCP is Verline Lema, MD   HPI:  Patient returns for routine followup status post minimally invasive Maze procedure on 10/29/2013.  His postoperative recovery has been uncomplicated, and he was discharged from the hospital on the 6th postoperative day.  Since hospital discharge he has continued to do well, and he was seen in followup by Dr. Stanford Breed last week.  At the time he was noted to have some serous drainage from one of his chest tube incisions on the right lateral chest wall. He was given a prescription for oral Keflex at the time.  The patient states that within a couple days a small amount of drainage stopped. He returns for office today feeling quite well. He states that over the last week he has made excellent progress. He has minimal residual soreness in his chest. He is not taking any sort of pain relievers. He denies any shortness of breath, and he states that overall he is feeling considerably better than he had been prior to surgery. He is not having any tachypalpitations or dizzy spells. He states that his heartbeat "feels as regular as a clock"   Current Outpatient Prescriptions  Medication Sig Dispense Refill  . amiodarone (PACERONE) 200 MG tablet Take 200 mg by mouth daily with supper.      Marland Kitchen apixaban (ELIQUIS) 5 MG TABS tablet Take 1 tablet (5 mg total) by mouth 2 (two) times daily.  60 tablet  11  . atorvastatin (LIPITOR) 20 MG tablet Take 20 mg by mouth daily with breakfast.      . cephALEXin (KEFLEX) 500 MG capsule Take 1 capsule (500 mg total) by mouth 2 (two) times daily.  14 capsule  0  . Cholecalciferol (VITAMIN D) 2000 UNITS tablet Take 2,000 Units by mouth daily.      . furosemide (LASIX) 40 MG tablet Take 40 mg by mouth daily with breakfast. If  swelling in wrist takes an extra dose      . lisinopril (PRINIVIL,ZESTRIL) 10 MG tablet Take 5 mg by mouth daily with breakfast.       . Melatonin 10 MG CAPS Take 10 mg by mouth at bedtime as needed (sleep).      . metoprolol succinate (TOPROL-XL) 50 MG 24 hr tablet Take 1 tablet (50 mg total) by mouth daily. Take with or immediately following a meal.  30 tablet  12  . omeprazole (PRILOSEC) 20 MG capsule Take 20 mg by mouth daily.      Marland Kitchen oxyCODONE (OXY IR/ROXICODONE) 5 MG immediate release tablet Take 1-2 tablets (5-10 mg total) by mouth every 4 (four) hours as needed for moderate pain.  50 tablet  0  . potassium chloride SA (K-DUR,KLOR-CON) 20 MEQ tablet Take 1 tablet (20 mEq total) by mouth daily.  30 tablet  11  . temazepam (RESTORIL) 15 MG capsule Take 15 mg by mouth at bedtime as needed.        No current facility-administered medications for this visit.      Physical Exam:   BP 137/78  Pulse 88  Resp 16  Ht 6\' 2"  (1.88 m)  Wt 239 lb (108.41 kg)  BMI 30.67 kg/m2  SpO2 98%  General:  Well-appearing  Chest:   Clear to auscultation  CV:   Regular rate and rhythm without murmur  Incisions:  Clean dry and healing nicely, chest tube sutures are removed  Abdomen:  Soft nontender  Extremities:  And well-perfused  Diagnostic Tests:  2 channel telemetry rhythm strip demonstrates normal sinus rhythm  CHEST 2 VIEW  COMPARISON: 11/02/2013 and 10/31/2013  FINDINGS:  Heart size is normal. Pulmonary vascularity is less prominent than  on the prior study and is now normal. There is slightly increased  small right pleural effusion and a new small left pleural effusion.  Clip is seen on the left atrial appendage.  IMPRESSION:  Increased small right effusion. New small left effusion. Improved  pulmonary vascularity.  Electronically Signed  By: Rozetta Nunnery M.D.  On: 11/12/2013 15:14     Impression:  Patient is doing very well approximately 3 weeks following minimally invasive Maze  procedure. He is maintaining sinus rhythm. His activity level is improving nicely and he feels well.   Plan:  I've encouraged patient to continue to gradually increase his physical activity as tolerated with his primary limitation remaining that he refrain from heavy lifting or strenuous use of his arms or shoulders for another month or so. I've encouraged him to enroll in outpatient cardiac rehabilitation program. I think he may resume driving an automobile. We have not made any changes to his current medications. I think it would be reasonable to consider stopping amiodarone if he remains in sinus rhythm in 2-3 months. Patient will return to see [place] for followup and rhythm check in 3 months.   Valentina Gu. Roxy Manns, MD 11/17/2013 4:27 PM

## 2013-11-17 NOTE — Patient Instructions (Signed)
The patient may continue to gradually increase their physical activity as tolerated.  They should refrain from any heavy lifting or strenuous use of their arms and shoulders until at least 8 weeks from the time of their surgery, and they should avoid activities that cause increased pain in their chest on the side of their surgical incision.  Otherwise they may continue to increase their activities without any particular limitations.  The patient may return to driving an automobile as long as they are no longer requiring oral narcotic pain relievers during the daytime.  It would be wise to start driving only short distances during the daylight and gradually increase from there as they feel comfortable.  The patient is encouraged to enroll and participate in the outpatient cardiac rehab program beginning as soon as practical.  

## 2013-11-18 DIAGNOSIS — I4891 Unspecified atrial fibrillation: Secondary | ICD-10-CM | POA: Diagnosis not present

## 2013-11-18 DIAGNOSIS — Z48812 Encounter for surgical aftercare following surgery on the circulatory system: Secondary | ICD-10-CM | POA: Diagnosis not present

## 2013-11-18 DIAGNOSIS — I25119 Atherosclerotic heart disease of native coronary artery with unspecified angina pectoris: Secondary | ICD-10-CM | POA: Diagnosis not present

## 2013-11-18 DIAGNOSIS — I5042 Chronic combined systolic (congestive) and diastolic (congestive) heart failure: Secondary | ICD-10-CM | POA: Diagnosis not present

## 2013-11-18 DIAGNOSIS — N189 Chronic kidney disease, unspecified: Secondary | ICD-10-CM | POA: Diagnosis not present

## 2013-11-18 DIAGNOSIS — I129 Hypertensive chronic kidney disease with stage 1 through stage 4 chronic kidney disease, or unspecified chronic kidney disease: Secondary | ICD-10-CM | POA: Diagnosis not present

## 2013-11-19 DIAGNOSIS — N189 Chronic kidney disease, unspecified: Secondary | ICD-10-CM | POA: Diagnosis not present

## 2013-11-19 DIAGNOSIS — I4891 Unspecified atrial fibrillation: Secondary | ICD-10-CM | POA: Diagnosis not present

## 2013-11-19 DIAGNOSIS — I129 Hypertensive chronic kidney disease with stage 1 through stage 4 chronic kidney disease, or unspecified chronic kidney disease: Secondary | ICD-10-CM | POA: Diagnosis not present

## 2013-11-19 DIAGNOSIS — Z48812 Encounter for surgical aftercare following surgery on the circulatory system: Secondary | ICD-10-CM | POA: Diagnosis not present

## 2013-11-19 DIAGNOSIS — I25119 Atherosclerotic heart disease of native coronary artery with unspecified angina pectoris: Secondary | ICD-10-CM | POA: Diagnosis not present

## 2013-11-19 DIAGNOSIS — I5042 Chronic combined systolic (congestive) and diastolic (congestive) heart failure: Secondary | ICD-10-CM | POA: Diagnosis not present

## 2013-11-21 DIAGNOSIS — I4891 Unspecified atrial fibrillation: Secondary | ICD-10-CM | POA: Diagnosis not present

## 2013-11-21 DIAGNOSIS — I129 Hypertensive chronic kidney disease with stage 1 through stage 4 chronic kidney disease, or unspecified chronic kidney disease: Secondary | ICD-10-CM | POA: Diagnosis not present

## 2013-11-21 DIAGNOSIS — I5042 Chronic combined systolic (congestive) and diastolic (congestive) heart failure: Secondary | ICD-10-CM | POA: Diagnosis not present

## 2013-11-21 DIAGNOSIS — Z48812 Encounter for surgical aftercare following surgery on the circulatory system: Secondary | ICD-10-CM | POA: Diagnosis not present

## 2013-11-21 DIAGNOSIS — I25119 Atherosclerotic heart disease of native coronary artery with unspecified angina pectoris: Secondary | ICD-10-CM | POA: Diagnosis not present

## 2013-11-21 DIAGNOSIS — N189 Chronic kidney disease, unspecified: Secondary | ICD-10-CM | POA: Diagnosis not present

## 2013-11-25 DIAGNOSIS — Z48812 Encounter for surgical aftercare following surgery on the circulatory system: Secondary | ICD-10-CM | POA: Diagnosis not present

## 2013-11-25 DIAGNOSIS — I4891 Unspecified atrial fibrillation: Secondary | ICD-10-CM | POA: Diagnosis not present

## 2013-11-25 DIAGNOSIS — N189 Chronic kidney disease, unspecified: Secondary | ICD-10-CM | POA: Diagnosis not present

## 2013-11-25 DIAGNOSIS — I5042 Chronic combined systolic (congestive) and diastolic (congestive) heart failure: Secondary | ICD-10-CM | POA: Diagnosis not present

## 2013-11-25 DIAGNOSIS — I129 Hypertensive chronic kidney disease with stage 1 through stage 4 chronic kidney disease, or unspecified chronic kidney disease: Secondary | ICD-10-CM | POA: Diagnosis not present

## 2013-11-25 DIAGNOSIS — I25119 Atherosclerotic heart disease of native coronary artery with unspecified angina pectoris: Secondary | ICD-10-CM | POA: Diagnosis not present

## 2013-11-27 DIAGNOSIS — Z48812 Encounter for surgical aftercare following surgery on the circulatory system: Secondary | ICD-10-CM | POA: Diagnosis not present

## 2013-11-27 DIAGNOSIS — I4891 Unspecified atrial fibrillation: Secondary | ICD-10-CM | POA: Diagnosis not present

## 2013-11-27 DIAGNOSIS — I25119 Atherosclerotic heart disease of native coronary artery with unspecified angina pectoris: Secondary | ICD-10-CM | POA: Diagnosis not present

## 2013-11-27 DIAGNOSIS — N189 Chronic kidney disease, unspecified: Secondary | ICD-10-CM | POA: Diagnosis not present

## 2013-11-27 DIAGNOSIS — I129 Hypertensive chronic kidney disease with stage 1 through stage 4 chronic kidney disease, or unspecified chronic kidney disease: Secondary | ICD-10-CM | POA: Diagnosis not present

## 2013-11-27 DIAGNOSIS — I5042 Chronic combined systolic (congestive) and diastolic (congestive) heart failure: Secondary | ICD-10-CM | POA: Diagnosis not present

## 2013-12-01 ENCOUNTER — Telehealth: Payer: Self-pay | Admitting: Cardiology

## 2013-12-01 NOTE — Telephone Encounter (Signed)
New problem:    Pt needs a call back and would like to get his flu shot 10/6 in [place] and has some questions about medication.

## 2013-12-01 NOTE — Telephone Encounter (Signed)
Spoke with pt, okay given for patient to get flu shot

## 2013-12-02 DIAGNOSIS — Z48812 Encounter for surgical aftercare following surgery on the circulatory system: Secondary | ICD-10-CM | POA: Diagnosis not present

## 2013-12-02 DIAGNOSIS — I5042 Chronic combined systolic (congestive) and diastolic (congestive) heart failure: Secondary | ICD-10-CM | POA: Diagnosis not present

## 2013-12-02 DIAGNOSIS — I4891 Unspecified atrial fibrillation: Secondary | ICD-10-CM | POA: Diagnosis not present

## 2013-12-02 DIAGNOSIS — N189 Chronic kidney disease, unspecified: Secondary | ICD-10-CM | POA: Diagnosis not present

## 2013-12-02 DIAGNOSIS — I129 Hypertensive chronic kidney disease with stage 1 through stage 4 chronic kidney disease, or unspecified chronic kidney disease: Secondary | ICD-10-CM | POA: Diagnosis not present

## 2013-12-02 DIAGNOSIS — I25119 Atherosclerotic heart disease of native coronary artery with unspecified angina pectoris: Secondary | ICD-10-CM | POA: Diagnosis not present

## 2013-12-15 DIAGNOSIS — I2510 Atherosclerotic heart disease of native coronary artery without angina pectoris: Secondary | ICD-10-CM

## 2014-01-06 NOTE — Progress Notes (Signed)
HPI: FU atrial fibrillation. Echo EF 20-25%. LHC demonstrated mod non-obs CAD. Most significant lesion was a mid D1 with 75% stenosis. DCM was felt to be out of proportion to CAD (NICM). Had DCCV on amiodarone but did not hold sinus. He was seen by Dr. Rayann Heman and referred to Dr. Roxy Manns for surgical maze. Carotid Dopplers August 2015 showed 1-39% bilateral stenosis. Echocardiogram August 2015 showed ejection fraction 25%, severe left atrial enlargement and mild mitral regurgitation. CTA August 2015 showed possible small focal dissection or penetrating ulcer in the descending thoracic aorta. No evidence of abdominal aortic aneurysm. Patient underwent maze procedure with clipping of left atrial appendage on October 29, 2013. Since last seen, He has mild dyspnea on exertion but improving. He has a tremor that he attributes to amiodarone. Studies:  - LHC (06/27/13): Mid LAD 40%, mid D1 75%, mid CFX 50-60%, mid RCA 50%, EF 20%, LVEDP 24 mmHg.   Current Outpatient Prescriptions  Medication Sig Dispense Refill  . amiodarone (PACERONE) 200 MG tablet Take 200 mg by mouth daily with supper.    Marland Kitchen apixaban (ELIQUIS) 5 MG TABS tablet Take 1 tablet (5 mg total) by mouth 2 (two) times daily. 60 tablet 11  . atorvastatin (LIPITOR) 20 MG tablet Take 20 mg by mouth daily with breakfast.    . Cholecalciferol (VITAMIN D) 2000 UNITS tablet Take 2,000 Units by mouth daily.    . furosemide (LASIX) 40 MG tablet Take 40 mg by mouth daily with breakfast. If swelling in wrist takes an extra dose    . lisinopril (PRINIVIL,ZESTRIL) 10 MG tablet Take 5 mg by mouth daily with breakfast.     . Melatonin 10 MG CAPS Take 10 mg by mouth at bedtime as needed (sleep).    . metoprolol succinate (TOPROL-XL) 50 MG 24 hr tablet Take 1 tablet (50 mg total) by mouth daily. Take with or immediately following a meal. 30 tablet 12  . omeprazole (PRILOSEC) 20 MG capsule Take 20 mg by mouth daily.    . potassium chloride SA (K-DUR,KLOR-CON)  20 MEQ tablet Take 1 tablet (20 mEq total) by mouth daily. 30 tablet 11  . temazepam (RESTORIL) 15 MG capsule Take 15 mg by mouth at bedtime as needed.      No current facility-administered medications for this visit.     Past Medical History  Diagnosis Date  . Hypertension   . Obesity   . GERD (gastroesophageal reflux disease)   . Chronic combined systolic and diastolic heart failure 0/27/2536    a.  Echo (06/25/13):  EF 20-25%, diff HK, restrictive physio, mild MR, mod to severe LAE, mild RVE, mildly reduced RVSF, mod to severe RAE  . NICM (nonischemic cardiomyopathy) with EF 20-25% 06/26/2013  . Atrial Fibrillation  06/25/2013  . Dyslipidemia 07/15/2013  . CAD (coronary artery disease), native coronary artery 06/28/2013    a. LHC (06/27/13):  Mid LAD 40%, mid D1 75%, mid CFX 50-60%, mid RCA 50%, EF 20%, LVEDP 24 mmHg.  Marland Kitchen Penetrating atherosclerotic ulcer of aorta 10/22/2013    Small penetrating ulcer of descending thoracic aorta discovered on routine CTA  . Shortness of breath     seen by Corah Willeford, & low energy   . CHF (congestive heart failure)   . Dysrhythmia     a-fib  . Anxiety   . Arthritis     R hand- OA  . Prostate cancer     watchful waiting, no treatment so far  . S/P  Maze operation for atrial fibrillation 10/29/2013    Complete bilateral atrial lesion set using cryothermy and bipolar radiofrequency ablation with clipping of LA appendage via right mini thoracotomy  . Chronic kidney disease     Past Surgical History  Procedure Laterality Date  . Medial partial knee replacement Left 1990's?  . Transurethral resection of prostate  2010  . Tee without cardioversion N/A 06/30/2013    Procedure: TRANSESOPHAGEAL ECHOCARDIOGRAM (TEE) ;  Surgeon: [name], MD;  Location: [facility];  Service: Cardiovascular;  Laterality: N/A;  . Cardioversion N/A 06/30/2013    Procedure: CARDIOVERSION;  Surgeon: [name], MD;  Location: [facility];  Service: Cardiovascular;  Laterality:  N/A;  . Cardioversion N/A 07/31/2013    Procedure: CARDIOVERSION;  Surgeon: Thayer Headings, MD;  Location: [facility];  Service: Cardiovascular;  Laterality: N/A;  . Cardiac catheterization  06/2013  . Minimally invasive maze procedure N/A 10/29/2013    Procedure: MINIMALLY INVASIVE MAZE PROCEDURE;  Surgeon: Rexene Alberts, MD;  Location: [facility];  Service: Open Heart Surgery;  Laterality: N/A;  . Intraoperative transesophageal echocardiogram N/A 10/29/2013    Procedure: INTRAOPERATIVE TRANSESOPHAGEAL ECHOCARDIOGRAM;  Surgeon: Rexene Alberts, MD;  Location: [facility];  Service: Open Heart Surgery;  Laterality: N/A;  . Clipping of atrial appendage  10/29/2013    Procedure: CLIPPING OF ATRIAL APPENDAGE;  Surgeon: Rexene Alberts, MD;  Location: [facility];  Service: Open Heart Surgery;;    History   Social History  . Marital Status: Widowed    Spouse Name: N/A    Number of Children: N/A  . Years of Education: N/A   Occupational History  . Not on file.   Social History Main Topics  . Smoking status: Never Smoker   . Smokeless tobacco: Never Used     Comment: 06/25/2013 "smoked very light; years and years ago"  . Alcohol Use: 8.4 oz/week    7 Glasses of wine, 7 Shots of liquor per week  . Drug Use: No  . Sexual Activity: Not Currently   Other Topics Concern  . Not on file   Social History Narrative   He lives in [place]. He is widowed. Wife passed of breast cancer last year. No children.     ROS: Tremor and fatigue but no fevers or chills, productive cough, hemoptysis, dysphasia, odynophagia, melena, hematochezia, dysuria, hematuria, rash, seizure activity, orthopnea, PND, pedal edema, claudication. Remaining systems are negative.  Physical Exam: Well-developed well-nourished in no acute distress.  Skin is warm and dry.  HEENT is normal.  Neck is supple.  Chest is clear to auscultation with normal expansion.  Cardiovascular exam is regular rate and rhythm.  Abdominal exam nontender or  distended. No masses palpated. Extremities show no edema. neuro grossly intact; Resting tremor

## 2014-01-07 ENCOUNTER — Ambulatory Visit (INDEPENDENT_AMBULATORY_CARE_PROVIDER_SITE_OTHER): Payer: Medicare Other | Admitting: Cardiology

## 2014-01-07 ENCOUNTER — Encounter: Payer: Self-pay | Admitting: Cardiology

## 2014-01-07 VITALS — BP 134/73 | HR 83 | Ht 74.0 in | Wt 245.8 lb

## 2014-01-07 DIAGNOSIS — I429 Cardiomyopathy, unspecified: Secondary | ICD-10-CM

## 2014-01-07 DIAGNOSIS — I2510 Atherosclerotic heart disease of native coronary artery without angina pectoris: Secondary | ICD-10-CM

## 2014-01-07 MED ORDER — LISINOPRIL 10 MG PO TABS: 10.0000 mg | ORAL_TABLET | Freq: Every day | ORAL | Status: DC

## 2014-01-07 NOTE — Assessment & Plan Note (Signed)
Continue statin. Not on aspirin given need for anticoagulation. 

## 2014-01-07 NOTE — Assessment & Plan Note (Signed)
Blood pressure controlled. Continue present medications. 

## 2014-01-07 NOTE — Assessment & Plan Note (Signed)
Patient remains in sinus rhythm on examination. He is complaining of a tremor that may be related to amiodarone. It has now been greater than 2 months since his Maze procedure. Discontinue amiodarone and follow. Continue apixaban.

## 2014-01-07 NOTE — Assessment & Plan Note (Signed)
We'll plan follow-up CTA in the future.

## 2014-01-07 NOTE — Assessment & Plan Note (Signed)
Continue beta blocker. Increase lisinopril to 10 mg daily. Check potassium and renal function in 1 week. Now that he is in sinus rhythm we will repeat his echocardiogram in December. If his ejection fraction remains less than 35% he would need to consider ICD.

## 2014-01-07 NOTE — Assessment & Plan Note (Signed)
Euvolemic on examination. Continue present dose of diuretics. 

## 2014-01-07 NOTE — Patient Instructions (Signed)
Your physician recommends that you schedule a follow-up appointment in: 3 MONTHS WITH DR CRENSHAW  STOP AMIODARONE  INCREASE LISINOPRIL TO 10 MG ONCE DAILY  Your physician recommends that you return for [place]  Your physician has requested that you have an echocardiogram. Echocardiography is a painless test that uses sound waves to create images of your heart. It provides your doctor with information about the size and shape of your heart and how well your heart's chambers and valves are working. This procedure takes approximately one hour. There are no restrictions for this procedure.MID-DECEMBER

## 2014-01-07 NOTE — Assessment & Plan Note (Signed)
Continue statin. 

## 2014-01-13 ENCOUNTER — Ambulatory Visit (HOSPITAL_COMMUNITY): Payer: Medicare Other | Admitting: Nurse Practitioner

## 2014-01-14 DIAGNOSIS — I428 Other cardiomyopathies: Secondary | ICD-10-CM | POA: Diagnosis not present

## 2014-01-14 DIAGNOSIS — I429 Cardiomyopathy, unspecified: Secondary | ICD-10-CM | POA: Diagnosis not present

## 2014-01-14 LAB — BASIC METABOLIC PANEL WITH GFR
BUN: 14 mg/dL (ref 6–23)
CO2: 24 mEq/L (ref 19–32)
CREATININE: 1.26 mg/dL (ref 0.50–1.35)
Calcium: 9.1 mg/dL (ref 8.4–10.5)
Chloride: 101 mEq/L (ref 96–112)
GFR, EST AFRICAN AMERICAN: 65 mL/min
GFR, EST NON AFRICAN AMERICAN: 57 mL/min — AB
Glucose, Bld: 114 mg/dL — ABNORMAL HIGH (ref 70–99)
POTASSIUM: 4.3 meq/L (ref 3.5–5.3)
Sodium: 138 mEq/L (ref 135–145)

## 2014-01-19 ENCOUNTER — Ambulatory Visit (INDEPENDENT_AMBULATORY_CARE_PROVIDER_SITE_OTHER): Payer: Medicare Other | Admitting: Podiatry

## 2014-01-19 DIAGNOSIS — B351 Tinea unguium: Secondary | ICD-10-CM

## 2014-01-19 DIAGNOSIS — M79673 Pain in unspecified foot: Secondary | ICD-10-CM | POA: Diagnosis not present

## 2014-01-19 NOTE — Progress Notes (Signed)
Subjective:     Patient ID: Jose Snyder, male   DOB: 06/19/1940, 73 y.o.   MRN: [id number]  HPI patient presents with thick brittle nails that he cannot cut   Review of Systems     Objective:   Physical Exam Neurovascular status intact with muscle strength adequate and thick yellow brittle nailbeds 1-5 of both feet    Assessment:     Mycotic nail infection is with pain 1-5 both feet    Plan:     Debris painful nailbeds 1-5 both feet with no iatrogenic bleeding noted      

## 2014-02-04 ENCOUNTER — Ambulatory Visit (HOSPITAL_BASED_OUTPATIENT_CLINIC_OR_DEPARTMENT_OTHER)
Admission: RE | Admit: 2014-02-04 | Discharge: 2014-02-04 | Disposition: A | Discharge status: Home / Self Care | Payer: Medicare Other | Source: Ambulatory Visit | Attending: Cardiology | Admitting: Cardiology

## 2014-02-04 DIAGNOSIS — I4891 Unspecified atrial fibrillation: Secondary | ICD-10-CM | POA: Diagnosis not present

## 2014-02-04 DIAGNOSIS — R0600 Dyspnea, unspecified: Secondary | ICD-10-CM | POA: Diagnosis not present

## 2014-02-04 DIAGNOSIS — I10 Essential (primary) hypertension: Secondary | ICD-10-CM | POA: Insufficient documentation

## 2014-02-04 DIAGNOSIS — I361 Nonrheumatic tricuspid (valve) insufficiency: Secondary | ICD-10-CM | POA: Insufficient documentation

## 2014-02-04 DIAGNOSIS — I517 Cardiomegaly: Secondary | ICD-10-CM | POA: Diagnosis not present

## 2014-02-04 DIAGNOSIS — I429 Cardiomyopathy, unspecified: Secondary | ICD-10-CM

## 2014-02-04 NOTE — Progress Notes (Signed)
Echocardiogram 2D Echocardiogram has been performed.  Jose Snyder 02/04/2014, 12:15 PM

## 2014-02-05 ENCOUNTER — Encounter (HOSPITAL_COMMUNITY): Payer: Self-pay | Admitting: Interventional Cardiology

## 2014-02-16 ENCOUNTER — Ambulatory Visit: Payer: Medicare Other | Admitting: Thoracic Surgery (Cardiothoracic Vascular Surgery)

## 2014-02-23 ENCOUNTER — Ambulatory Visit (INDEPENDENT_AMBULATORY_CARE_PROVIDER_SITE_OTHER): Payer: Medicare Other | Admitting: Thoracic Surgery (Cardiothoracic Vascular Surgery)

## 2014-02-23 ENCOUNTER — Encounter: Payer: Self-pay | Admitting: Thoracic Surgery (Cardiothoracic Vascular Surgery)

## 2014-02-23 VITALS — BP 139/80 | HR 110 | Resp 20 | Ht 74.0 in | Wt 245.0 lb

## 2014-02-23 DIAGNOSIS — I2510 Atherosclerotic heart disease of native coronary artery without angina pectoris: Secondary | ICD-10-CM | POA: Diagnosis not present

## 2014-02-23 DIAGNOSIS — Z9889 Other specified postprocedural states: Secondary | ICD-10-CM | POA: Diagnosis not present

## 2014-02-23 DIAGNOSIS — Z8679 Personal history of other diseases of the circulatory system: Secondary | ICD-10-CM

## 2014-02-23 NOTE — Progress Notes (Signed)
Jose Snyder       [place] [address]             [phone]     CARDIOTHORACIC SURGERY OFFICE NOTE  Referring Provider is Jose Grayer, MD  Primary Cardiologist is Jose Perla, MD PCP is Jose Lema, MD   HPI:  Patient returns for routine followup status post minimally invasive Maze procedure on 10/29/2013.He was last seen here in our office on 11/17/2013.  Since then he has been seen in follow-up for Dr. Stanford Snyder on 01/06/2014. Amiodarone was discontinued at that time because of a mild tremor. The patient returns to the office today feeling well. His exercise tolerance continues to improve. He denies any symptoms suggestive of recurrent atrial fibrillation, although he states that he is not sure if perhaps once or twice E May have had a brief episode of irregular pulse.  Soreness in his chest from surgery has almost completely resolved. Overall he feels well. He reports that he forgot to take his beta blocker and blood thinner yesterday.   Current Outpatient Prescriptions  Medication Sig Dispense Refill  . apixaban (ELIQUIS) 5 MG TABS tablet Take 1 tablet (5 mg total) by mouth 2 (two) times daily. 60 tablet 11  . atorvastatin (LIPITOR) 20 MG tablet Take 20 mg by mouth daily with breakfast.    . Cholecalciferol (VITAMIN D) 2000 UNITS tablet Take 2,000 Units by mouth daily.    . fluticasone (FLONASE) 50 MCG/ACT nasal spray Place into both nostrils daily.    . furosemide (LASIX) 40 MG tablet Take 40 mg by mouth daily with breakfast. If swelling in wrist takes an extra dose    . lisinopril (PRINIVIL,ZESTRIL) 10 MG tablet Take 1 tablet (10 mg total) by mouth daily with breakfast.    . Melatonin 10 MG CAPS Take 10 mg by mouth at bedtime as needed (sleep).    . metoprolol succinate (TOPROL-XL) 50 MG 24 hr tablet Take 1 tablet (50 mg total) by mouth daily. Take with or immediately following a meal. 30 tablet 12  . omeprazole (PRILOSEC) 20 MG capsule Take 20  mg by mouth daily.    . potassium chloride SA (K-DUR,KLOR-CON) 20 MEQ tablet Take 1 tablet (20 mEq total) by mouth daily. 30 tablet 11  . rOPINIRole (REQUIP) 0.5 MG tablet Take 0.5 mg by mouth 2 (two) times daily.    . temazepam (RESTORIL) 15 MG capsule Take 15 mg by mouth at bedtime as needed.      No current facility-administered medications for this visit.      Physical Exam:   BP 139/80 mmHg  Pulse 110  Resp 20  Ht 6\' 2"  (1.88 m)  Wt 245 lb (111.131 kg)  BMI 31.44 kg/m2  SpO2 97%  General:  Well-appearing  Chest:   Clear to auscultation  CV:   Regular rate and rhythm  Incisions:  Completely healed  Abdomen:  Soft and nontender  Extremities:  Warm and well-perfused  Diagnostic Tests:  2 channel telemetry rhythm strip reveals what appears to be sinus tachycardia with heart rate 100-110   Impression:  Patient is doing well more than 3 months status post minimally invasive maze procedure. He has been maintaining sinus rhythm. He is somewhat tachycardic in the office today, but he forgot to take his beta blocker last night.  Plan:  The patient will continue taking medications as previously prescribed. He will continue to follow-up with Dr. Stanford Snyder. He will be  referred to the atrial fibrillation clinic for long-term surveillance. He will return to our office for follow up in 3 months, at which time he will need CT angiography of his chest for follow-up of small localized dissection or penetrating atherosclerotic ulcer of the thoracic aorta.  I spent in excess of 15 minutes during the conduct of this office consultation and >50% of this time involved direct face-to-face encounter with the patient for counseling and/or coordination of their care.   Jose Gu. Roxy Manns, MD 02/23/2014 11:43 AM

## 2014-02-23 NOTE — Patient Instructions (Signed)
Patient may resume unrestricted physical activity without any particular limitations at this time.

## 2014-02-24 NOTE — Telephone Encounter (Deleted)
error 

## 2014-04-01 DIAGNOSIS — C61 Malignant neoplasm of prostate: Secondary | ICD-10-CM | POA: Diagnosis not present

## 2014-04-07 NOTE — Progress Notes (Signed)
HPI: FU atrial fibrillation. Echo 4/15 EF 20-25%. LHC demonstrated mod non-obs CAD. Most significant lesion was a mid D1 with 75% stenosis. DCM was felt to be out of proportion to CAD (NICM). Had DCCV on amiodarone but did not hold sinus. He was seen by Dr. Rayann Heman and referred to Dr. Roxy Manns for surgical maze. Carotid Dopplers August 2015 showed 1-39% bilateral stenosis. CTA August 2015 showed possible small focal dissection or penetrating ulcer in the descending thoracic aorta. No evidence of abdominal aortic aneurysm. Patient underwent maze procedure with clipping of left atrial appendage on October 29, 2013. Echocardiogram repeated December 2015 and showed improvement in LV function. Ejection fraction 50-55% and mild left atrial enlargement. Since last seen, he has develop congestion from an upper respiratory infection but otherwise no chest pain, pedal edema or syncope. Studies:  - LHC (06/27/13): Mid LAD 40%, mid D1 75%, mid CFX 50-60%, mid RCA 50%, EF 20%, LVEDP 24 mmHg.   Current Outpatient Prescriptions  Medication Sig Dispense Refill  . apixaban (ELIQUIS) 5 MG TABS tablet Take 1 tablet (5 mg total) by mouth 2 (two) times daily. 60 tablet 11  . atorvastatin (LIPITOR) 20 MG tablet Take 20 mg by mouth daily with breakfast.    . Cholecalciferol (VITAMIN D) 2000 UNITS tablet Take 2,000 Units by mouth daily.    . fluticasone (FLONASE) 50 MCG/ACT nasal spray Place into both nostrils daily.    . furosemide (LASIX) 40 MG tablet Take 40 mg by mouth daily with breakfast. If swelling in wrist takes an extra dose    . lisinopril (PRINIVIL,ZESTRIL) 10 MG tablet Take 1 tablet (10 mg total) by mouth daily with breakfast.    . Melatonin 10 MG CAPS Take 10 mg by mouth at bedtime as needed (sleep).    . metoprolol succinate (TOPROL-XL) 50 MG 24 hr tablet Take 1 tablet (50 mg total) by mouth daily. Take with or immediately following a meal. 30 tablet 12  . omeprazole (PRILOSEC) 20 MG capsule Take 20 mg by  mouth daily.    . potassium chloride SA (K-DUR,KLOR-CON) 20 MEQ tablet Take 1 tablet (20 mEq total) by mouth daily. 30 tablet 11  . rOPINIRole (REQUIP) 0.5 MG tablet Take 0.5 mg by mouth 2 (two) times daily.    . temazepam (RESTORIL) 15 MG capsule Take 15 mg by mouth at bedtime as needed.      No current facility-administered medications for this visit.     Past Medical History  Diagnosis Date  . Hypertension   . Obesity   . GERD (gastroesophageal reflux disease)   . Chronic combined systolic and diastolic heart failure 2/70/3500    a.  Echo (06/25/13):  EF 20-25%, diff HK, restrictive physio, mild MR, mod to severe LAE, mild RVE, mildly reduced RVSF, mod to severe RAE  . NICM (nonischemic cardiomyopathy) with EF 20-25% 06/26/2013  . Atrial Fibrillation  06/25/2013  . Dyslipidemia 07/15/2013  . CAD (coronary artery disease), native coronary artery 06/28/2013    a. LHC (06/27/13):  Mid LAD 40%, mid D1 75%, mid CFX 50-60%, mid RCA 50%, EF 20%, LVEDP 24 mmHg.  Marland Kitchen Penetrating atherosclerotic ulcer of aorta 10/22/2013    Small penetrating ulcer of descending thoracic aorta discovered on routine CTA  . Shortness of breath     seen by Areil Ottey, & low energy   . CHF (congestive heart failure)   . Dysrhythmia     a-fib  . Anxiety   . Arthritis  R hand- OA  . Prostate cancer     watchful waiting, no treatment so far  . S/P Maze operation for atrial fibrillation 10/29/2013    Complete bilateral atrial lesion set using cryothermy and bipolar radiofrequency ablation with clipping of LA appendage via right mini thoracotomy  . Chronic kidney disease     Past Surgical History  Procedure Laterality Date  . Medial partial knee replacement Left 1990's?  . Transurethral resection of prostate  2010  . Tee without cardioversion N/A 06/30/2013    Procedure: TRANSESOPHAGEAL ECHOCARDIOGRAM (TEE) ;  Surgeon: [name], MD;  Location: [facility];  Service: Cardiovascular;  Laterality: N/A;  .  Cardioversion N/A 06/30/2013    Procedure: CARDIOVERSION;  Surgeon: [name], MD;  Location: [facility];  Service: Cardiovascular;  Laterality: N/A;  . Cardioversion N/A 07/31/2013    Procedure: CARDIOVERSION;  Surgeon: Thayer Headings, MD;  Location: [facility];  Service: Cardiovascular;  Laterality: N/A;  . Cardiac catheterization  06/2013  . Minimally invasive maze procedure N/A 10/29/2013    Procedure: MINIMALLY INVASIVE MAZE PROCEDURE;  Surgeon: Rexene Alberts, MD;  Location: [facility];  Service: Open Heart Surgery;  Laterality: N/A;  . Intraoperative transesophageal echocardiogram N/A 10/29/2013    Procedure: INTRAOPERATIVE TRANSESOPHAGEAL ECHOCARDIOGRAM;  Surgeon: Rexene Alberts, MD;  Location: [facility];  Service: Open Heart Surgery;  Laterality: N/A;  . Clipping of atrial appendage  10/29/2013    Procedure: CLIPPING OF ATRIAL APPENDAGE;  Surgeon: Rexene Alberts, MD;  Location: [facility];  Service: Open Heart Surgery;;  . Left and right heart catheterization with coronary angiogram N/A 06/27/2013    Procedure: LEFT AND RIGHT HEART CATHETERIZATION WITH CORONARY ANGIOGRAM;  Surgeon: Jettie Booze, MD;  [facility] C[facility];  Service: Cardiovascular;  Laterality: N/A;    History   Social History  . Marital Status: Widowed    Spouse Name: N/A  . Number of Children: N/A  . Years of Education: N/A   Occupational History  . Not on file.   Social History Main Topics  . Smoking status: Never Smoker   . Smokeless tobacco: Never Used     Comment: 06/25/2013 "smoked very light; years and years ago"  . Alcohol Use: 8.4 oz/week    7 Glasses of wine, 7 Shots of liquor per week  . Drug Use: No  . Sexual Activity: Not Currently   Other Topics Concern  . Not on file   Social History Narrative   He lives in [place]. He is widowed. Wife passed of breast cancer last year. No children.     ROS: recent URI but no hemoptysis, dysphasia, odynophagia, melena, hematochezia, dysuria, hematuria,  rash, seizure activity, orthopnea, PND, pedal edema, claudication. Remaining systems are negative.  Physical Exam: Well-developed well-nourished in no acute distress.  Skin is warm and dry.  HEENT is normal.  Neck is supple.  Chest is clear to auscultation with normal expansion.  Cardiovascular exam is regular rate and rhythm.  Abdominal exam nontender or distended. No masses palpated. Extremities show no edema. neuro grossly intact  ECG sinus rhythm at a rate of 83, right axis deviation.

## 2014-04-08 ENCOUNTER — Encounter: Payer: Self-pay | Admitting: Cardiology

## 2014-04-08 ENCOUNTER — Ambulatory Visit (INDEPENDENT_AMBULATORY_CARE_PROVIDER_SITE_OTHER): Payer: Medicare Other | Admitting: Cardiology

## 2014-04-08 VITALS — BP 152/98 | HR 83 | Ht 74.0 in | Wt 256.0 lb

## 2014-04-08 DIAGNOSIS — I10 Essential (primary) hypertension: Secondary | ICD-10-CM

## 2014-04-08 DIAGNOSIS — I429 Cardiomyopathy, unspecified: Secondary | ICD-10-CM | POA: Diagnosis not present

## 2014-04-08 DIAGNOSIS — I4891 Unspecified atrial fibrillation: Secondary | ICD-10-CM | POA: Diagnosis not present

## 2014-04-08 DIAGNOSIS — I482 Chronic atrial fibrillation, unspecified: Secondary | ICD-10-CM

## 2014-04-08 DIAGNOSIS — I428 Other cardiomyopathies: Secondary | ICD-10-CM

## 2014-04-08 DIAGNOSIS — I5042 Chronic combined systolic (congestive) and diastolic (congestive) heart failure: Secondary | ICD-10-CM

## 2014-04-08 DIAGNOSIS — I700 Atherosclerosis of aorta: Secondary | ICD-10-CM

## 2014-04-08 DIAGNOSIS — E785 Hyperlipidemia, unspecified: Secondary | ICD-10-CM | POA: Diagnosis not present

## 2014-04-08 DIAGNOSIS — I719 Aortic aneurysm of unspecified site, without rupture: Secondary | ICD-10-CM

## 2014-04-08 MED ORDER — METOPROLOL SUCCINATE ER 50 MG PO TB24: ORAL_TABLET | ORAL | Status: DC

## 2014-04-08 MED ORDER — LISINOPRIL 20 MG PO TABS: 20.0000 mg | ORAL_TABLET | Freq: Every day | ORAL | Status: DC

## 2014-04-08 NOTE — Patient Instructions (Signed)
Your physician wants you to follow-up in: [place] will receive a reminder letter in the mail two months in advance. If you don't receive a letter, please call our office to schedule the follow-up appointment.   INCREASE LISINOPRIL TO 20 MG ONCE DAILY=2 OF THE 10 MG TABLETS ONCE DAILY  INCREASE METOPROLOL TO 75 MG ONCE DAILY=ONE AND ONE HALF TABLETS ONCE DAILY  Your physician recommends that you return for lab work in: [place]

## 2014-04-08 NOTE — Assessment & Plan Note (Signed)
euvolemic on examination. Continue present dose of Lasix. 

## 2014-04-08 NOTE — Assessment & Plan Note (Signed)
Continue statin. 

## 2014-04-08 NOTE — Assessment & Plan Note (Signed)
Blood pressure elevated. Increase lisinopril to 20 mg daily and Toprol to 75 mg daily. Check potassium and renal function in 1 week.

## 2014-04-08 NOTE — Assessment & Plan Note (Signed)
Plan follow-up CT in the future. 

## 2014-04-08 NOTE — Assessment & Plan Note (Signed)
Continue ACE inhibitor and beta blocker. LV function improved on most recent echo.

## 2014-04-08 NOTE — Assessment & Plan Note (Signed)
Continue statin. Not on aspirin given need for anticoagulation. 

## 2014-04-08 NOTE — Assessment & Plan Note (Signed)
Patient remains in sinus rhythm. Continue metoprolol and apixaban. Check hemoglobin and renal function.

## 2014-04-20 ENCOUNTER — Ambulatory Visit (INDEPENDENT_AMBULATORY_CARE_PROVIDER_SITE_OTHER): Payer: Medicare Other | Admitting: Podiatry

## 2014-04-20 DIAGNOSIS — B351 Tinea unguium: Secondary | ICD-10-CM

## 2014-04-20 DIAGNOSIS — M79673 Pain in unspecified foot: Secondary | ICD-10-CM

## 2014-04-20 DIAGNOSIS — I482 Chronic atrial fibrillation: Secondary | ICD-10-CM | POA: Diagnosis not present

## 2014-04-20 DIAGNOSIS — I429 Cardiomyopathy, unspecified: Secondary | ICD-10-CM | POA: Diagnosis not present

## 2014-04-20 DIAGNOSIS — I428 Other cardiomyopathies: Secondary | ICD-10-CM | POA: Diagnosis not present

## 2014-04-20 DIAGNOSIS — I4891 Unspecified atrial fibrillation: Secondary | ICD-10-CM | POA: Diagnosis not present

## 2014-04-20 NOTE — Progress Notes (Signed)
Subjective:     Patient ID: Jose Snyder, male   DOB: 05-20-1940, 74 y.o.   MRN: [id number]  HPI patient presents with thick brittle nails that he cannot cut   Review of Systems     Objective:   Physical Exam Neurovascular status intact with muscle strength adequate and thick yellow brittle nailbeds 1-5 of both feet    Assessment:     Mycotic nail infection is with pain 1-5 both feet    Plan:     Debris painful nailbeds 1-5 both feet with no iatrogenic bleeding noted

## 2014-04-21 LAB — CBC
HEMATOCRIT: 40.9 % (ref 39.0–52.0)
HEMOGLOBIN: 13.7 g/dL (ref 13.0–17.0)
MCH: 31.4 pg (ref 26.0–34.0)
MCHC: 33.5 g/dL (ref 30.0–36.0)
MCV: 93.6 fL (ref 78.0–100.0)
MPV: 12 fL (ref 8.6–12.4)
Platelets: 218 10*3/uL (ref 150–400)
RBC: 4.37 MIL/uL (ref 4.22–5.81)
RDW: 15.1 % (ref 11.5–15.5)
WBC: 7.8 10*3/uL (ref 4.0–10.5)

## 2014-04-21 LAB — BASIC METABOLIC PANEL WITH GFR
BUN: 20 mg/dL (ref 6–23)
CALCIUM: 9.4 mg/dL (ref 8.4–10.5)
CO2: 21 mEq/L (ref 19–32)
Chloride: 105 mEq/L (ref 96–112)
Creat: 1.28 mg/dL (ref 0.50–1.35)
GFR, EST NON AFRICAN AMERICAN: 56 mL/min — AB
GFR, Est African American: 64 mL/min
Glucose, Bld: 182 mg/dL — ABNORMAL HIGH (ref 70–99)
Potassium: 4.2 mEq/L (ref 3.5–5.3)
Sodium: 141 mEq/L (ref 135–145)

## 2014-05-04 DIAGNOSIS — C61 Malignant neoplasm of prostate: Secondary | ICD-10-CM | POA: Diagnosis not present

## 2014-05-04 DIAGNOSIS — N5201 Erectile dysfunction due to arterial insufficiency: Secondary | ICD-10-CM | POA: Diagnosis not present

## 2014-05-04 DIAGNOSIS — N481 Balanitis: Secondary | ICD-10-CM | POA: Diagnosis not present

## 2014-05-07 ENCOUNTER — Other Ambulatory Visit: Payer: Self-pay

## 2014-05-07 DIAGNOSIS — I7101 Dissection of thoracic aorta: Secondary | ICD-10-CM

## 2014-05-07 DIAGNOSIS — I71019 Dissection of thoracic aorta, unspecified: Secondary | ICD-10-CM

## 2014-06-02 ENCOUNTER — Other Ambulatory Visit: Payer: Self-pay | Admitting: Diagnostic Radiology

## 2014-06-02 ENCOUNTER — Other Ambulatory Visit: Payer: Self-pay

## 2014-06-02 DIAGNOSIS — I712 Thoracic aortic aneurysm, without rupture, unspecified: Secondary | ICD-10-CM

## 2014-06-08 ENCOUNTER — Ambulatory Visit: Payer: BLUE CROSS/BLUE SHIELD | Admitting: Thoracic Surgery (Cardiothoracic Vascular Surgery)

## 2014-06-17 DIAGNOSIS — I712 Thoracic aortic aneurysm, without rupture: Secondary | ICD-10-CM | POA: Diagnosis not present

## 2014-06-18 LAB — BUN: BUN: 23 mg/dL (ref 6–23)

## 2014-06-18 LAB — CREATININE, SERUM: CREATININE: 1.45 mg/dL — AB (ref 0.50–1.35)

## 2014-06-22 ENCOUNTER — Encounter: Payer: Self-pay | Admitting: Thoracic Surgery (Cardiothoracic Vascular Surgery)

## 2014-06-22 ENCOUNTER — Ambulatory Visit (INDEPENDENT_AMBULATORY_CARE_PROVIDER_SITE_OTHER): Payer: Medicare Other | Admitting: Thoracic Surgery (Cardiothoracic Vascular Surgery)

## 2014-06-22 ENCOUNTER — Ambulatory Visit
Admission: RE | Admit: 2014-06-22 | Discharge: 2014-06-22 | Disposition: A | Discharge status: Home / Self Care | Payer: Medicare Other | Source: Ambulatory Visit | Attending: Thoracic Surgery (Cardiothoracic Vascular Surgery) | Admitting: Thoracic Surgery (Cardiothoracic Vascular Surgery)

## 2014-06-22 VITALS — BP 155/80 | HR 90 | Resp 20 | Ht 74.0 in | Wt 264.0 lb

## 2014-06-22 DIAGNOSIS — R911 Solitary pulmonary nodule: Secondary | ICD-10-CM

## 2014-06-22 DIAGNOSIS — I4891 Unspecified atrial fibrillation: Secondary | ICD-10-CM | POA: Diagnosis not present

## 2014-06-22 DIAGNOSIS — I71019 Dissection of thoracic aorta, unspecified: Secondary | ICD-10-CM

## 2014-06-22 DIAGNOSIS — Z9889 Other specified postprocedural states: Principal | ICD-10-CM

## 2014-06-22 DIAGNOSIS — I700 Atherosclerosis of aorta: Secondary | ICD-10-CM

## 2014-06-22 DIAGNOSIS — R918 Other nonspecific abnormal finding of lung field: Secondary | ICD-10-CM | POA: Diagnosis not present

## 2014-06-22 DIAGNOSIS — I2510 Atherosclerotic heart disease of native coronary artery without angina pectoris: Secondary | ICD-10-CM | POA: Diagnosis not present

## 2014-06-22 DIAGNOSIS — I7101 Dissection of thoracic aorta: Secondary | ICD-10-CM | POA: Diagnosis not present

## 2014-06-22 DIAGNOSIS — Z8679 Personal history of other diseases of the circulatory system: Secondary | ICD-10-CM

## 2014-06-22 DIAGNOSIS — I719 Aortic aneurysm of unspecified site, without rupture: Secondary | ICD-10-CM

## 2014-06-22 HISTORY — DX: Solitary pulmonary nodule: R91.1

## 2014-06-22 MED ORDER — IOPAMIDOL (ISOVUE-370) INJECTION 76%
75.0000 mL | Freq: Once | INTRAVENOUS | Status: AC | PRN
  Administered 2014-06-22: 75 mL via INTRAVENOUS

## 2014-06-22 NOTE — Progress Notes (Signed)
[address]       [place] [address]             [phone]     CARDIOTHORACIC SURGERY OFFICE NOTE  Referring Provider is Thompson Grayer, MD  Primary Cardiologist is Lelon Perla, MD PCP is Verline Lema, MD   HPI:  Patient returns for routine followup approximately 6 months status post minimally invasive Maze procedure on 10/29/2013.He was last seen here in our office on 02/23/2014.  Since then he has continued to do well and maintained sinus rhythm.  He was seen in follow-up by Dr. Stanford Breed on 04/08/2014 and he returns to our office for routine follow-up today. He denies any symptoms of palpitations to suggest a recurrence of paroxysmal atrial fibrillation as he had suffered from prior to surgery. He describes mild symptoms of exertional shortness of breath that only occur with more strenuous physical activity, consistent with chronic diastolic congestive heart failure New [place] Heart Association functional class II. He states that his exercise tolerance is considerably better than it was prior to surgery but not as good as it was 2 or 3 years ago. He denies any symptoms of chest tightness with chest pressure either with activity or at rest. He has not had any bleeding complications related to long-term anticoagulation using Eliquis. He has not had any transient visual disturbances or other neurologic events. Overall he feels well.   Current Outpatient Prescriptions  Medication Sig Dispense Refill  . apixaban (ELIQUIS) 5 MG TABS tablet Take 1 tablet (5 mg total) by mouth 2 (two) times daily. 60 tablet 11  . atorvastatin (LIPITOR) 20 MG tablet Take 20 mg by mouth daily with breakfast.    . Cholecalciferol (VITAMIN D) 2000 UNITS tablet Take 2,000 Units by mouth daily.    . fluticasone (FLONASE) 50 MCG/ACT nasal spray Place into both nostrils daily.    . furosemide (LASIX) 40 MG tablet Take 40 mg by mouth daily with breakfast. If swelling in wrist takes an  extra dose    . lisinopril (PRINIVIL,ZESTRIL) 20 MG tablet Take 1 tablet (20 mg total) by mouth daily with breakfast. 30 tablet 12  . Melatonin 10 MG CAPS Take 10 mg by mouth at bedtime as needed (sleep).    . metoprolol succinate (TOPROL-XL) 50 MG 24 hr tablet TAKE ONE AND ONE HALF TABLET ONCE DAILY Take with or immediately following a meal. 45 tablet 12  . omeprazole (PRILOSEC) 20 MG capsule Take 20 mg by mouth daily.    . potassium chloride SA (K-DUR,KLOR-CON) 20 MEQ tablet Take 1 tablet (20 mEq total) by mouth daily. 30 tablet 11  . rOPINIRole (REQUIP) 0.5 MG tablet Take 0.5 mg by mouth 2 (two) times daily.    . temazepam (RESTORIL) 15 MG capsule Take 15 mg by mouth at bedtime as needed.      No current facility-administered medications for this visit.      Physical Exam:   BP 155/80 mmHg  Pulse 90  Resp 20  Ht 6\' 2"  (1.88 m)  Wt 264 lb (119.75 kg)  BMI 33.88 kg/m2  SpO2 96%  General:  Well-appearing  Chest:   Clear  CV:   Regular rate and rhythm without murmur  Incisions:  Completely healed  Abdomen:  Soft and nontender  Extremities:  Warm and well-perfused  Diagnostic Tests:  2 channel telemetry rhythm strip demonstrates normal sinus rhythm   CT ANGIOGRAPHY CHEST WITH CONTRAST  TECHNIQUE: Multidetector CT  imaging of the chest was performed using the standard protocol during bolus administration of intravenous contrast. Multiplanar CT image reconstructions and MIPs were obtained to evaluate the vascular anatomy.  CONTRAST: 75 cc Isovue 370 intravenous  COMPARISON: 10/22/2013  FINDINGS: THORACIC INLET/BODY WALL:  No acute abnormality.  MEDIASTINUM:  No cardiomegaly. Incidental lipomatous hypertrophy of the interatrial septum. The patient is status post interval left atrial appendage exclusion. There is no visible filling of the atrial appendage. Diffuse atherosclerosis, including the coronary arteries.  Normal caliber aorta with no evidence of  intramural hematoma or aortic dissection. The ascending aortic diameter is up to 33 mm, the arch diameter is up to 26 mm, and descending aortic diameter is up to 24 mm. The outpouching from left and posterior wall of the distal descending aorta is unchanged in size, measuring 7 mm in depth and 13 mm in width. There is no associated dissection flap. No surrounding hematoma or new wall thickening.  LUNG WINDOWS:  Mild emphysema at the apices.  11 mm right and 14 mm left ground-glass nodules are new from 10/22/2013. Timing favors inflammatory nodule or atelectasis, but the clear nodular morphology warrants follow-up.  UPPER ABDOMEN:  No acute findings.  OSSEOUS:  No acute fracture. No suspicious lytic or blastic lesions.  Review of the MIP images confirms the above findings.  IMPRESSION: 1. 13 x 7 mm outpouching from the descending thoracic aorta is unchanged from 10/22/2013 and most consistent with penetrating atherosclerotic ulcer. 2. New biapical ground-glass nodules, up to 14 mm on the left. Timing favors inflammatory process or atelectasis, but the nodular morphology warrants follow-up with noncontrast chest CT in 3 months.   Electronically Signed  By: Monte Fantasia M.D.  On: 06/22/2014 09:22   Impression:  Patient is doing very well approximately 6 months status post minimally invasive maze procedure. He is maintaining sinus rhythm.  He remains chronically anticoagulated using Eliquis.  Follow-up CT angiogram of the chest demonstrates stable radiographic appearance of penetrating atherosclerotic ulcer in the descending thoracic aorta.  The CT angiogram also demonstrates 2 small groundglass opacity abnormalities in the lungs, one on each side. These were not present previously and most likely represent inflammatory changes.   Plan:  The patient will return in 6 months for routine follow-up including repeat CT angiogram of the chest.  We have not  recommended any changes to his current medications at this time.  I spent in excess of 15 minutes during the conduct of this office consultation and >50% of this time involved direct face-to-face encounter with the patient for counseling and/or coordination of their care.   Valentina Gu. Roxy Manns, MD 06/22/2014 12:12 PM

## 2014-06-22 NOTE — Telephone Encounter (Signed)
ERROR

## 2014-06-29 ENCOUNTER — Telehealth: Payer: Self-pay | Admitting: Cardiology

## 2014-06-29 NOTE — Telephone Encounter (Signed)
Please call,concerning his lab results from 06-21-24 please.

## 2014-06-29 NOTE — Telephone Encounter (Signed)
Labs discussed w/ patient. No further questions.

## 2014-07-13 ENCOUNTER — Ambulatory Visit: Payer: Medicare Other

## 2014-07-16 ENCOUNTER — Ambulatory Visit (INDEPENDENT_AMBULATORY_CARE_PROVIDER_SITE_OTHER): Payer: Medicare Other | Admitting: Podiatry

## 2014-07-16 ENCOUNTER — Encounter: Payer: Self-pay | Admitting: Podiatry

## 2014-07-16 DIAGNOSIS — B351 Tinea unguium: Secondary | ICD-10-CM | POA: Diagnosis not present

## 2014-07-16 DIAGNOSIS — M79673 Pain in unspecified foot: Secondary | ICD-10-CM

## 2014-07-16 NOTE — Progress Notes (Signed)
Patient presents to the office today with a chief complaint of painful elongated toenails.  Objective: Pulses are palpable bilateral. Nails are thick yellow dystrophic clinically mycotic and painful palpation.  Assessment: Pain in limb secondary to onychomycosis 1 through 5 bilateral.  Plan: Debridement of nails 1 through 5 bilateral covered service secondary to pain.  

## 2014-07-22 ENCOUNTER — Telehealth: Payer: Self-pay | Admitting: Cardiology

## 2014-07-22 DIAGNOSIS — R609 Edema, unspecified: Secondary | ICD-10-CM | POA: Diagnosis not present

## 2014-07-22 DIAGNOSIS — Z79899 Other long term (current) drug therapy: Secondary | ICD-10-CM | POA: Diagnosis not present

## 2014-07-22 DIAGNOSIS — M25473 Effusion, unspecified ankle: Secondary | ICD-10-CM

## 2014-07-22 LAB — BASIC METABOLIC PANEL
BUN: 25 mg/dL — ABNORMAL HIGH (ref 6–23)
CHLORIDE: 102 meq/L (ref 96–112)
CO2: 21 mEq/L (ref 19–32)
Calcium: 9 mg/dL (ref 8.4–10.5)
Creat: 1.22 mg/dL (ref 0.50–1.35)
Glucose, Bld: 137 mg/dL — ABNORMAL HIGH (ref 70–99)
POTASSIUM: 4.3 meq/L (ref 3.5–5.3)
SODIUM: 139 meq/L (ref 135–145)

## 2014-07-22 NOTE — Telephone Encounter (Signed)
Called patient back, gave recommendations, patient verbalized understanding. He requested f/u to be set up, call w/ time and date and he will be there - informed him I would let scheduling look at this, don't have anything upcoming for a PA visit at [facility], would likely need to have him seen at [facility] st by extender. Pt voiced understanding.

## 2014-07-22 NOTE — Telephone Encounter (Signed)
New message      Pt c/o swelling: STAT is pt has developed SOB within 24 hours  1. How long have you been experiencing swelling?  approx 2 weeks 2. Where is the swelling located? Ankle swelling  3.  Are you currently taking a "fluid pill"? Yes----furosemide  2 tab daily (40mg ) since 1.5 weeks ago 4.  Are you currently SOB?  Little if he exerts himself  5.  Have you traveled recently? no

## 2014-07-22 NOTE — Telephone Encounter (Signed)
Change lasix to 40 BID; agree with BMET; schedule fu paov Kirk Ruths

## 2014-07-22 NOTE — Telephone Encounter (Signed)
Spoke to Jose Snyder Jose Snyder states he called because he has been having ankle swelling. ORIGINAL TAKE 40 MG LASIX DAILY. Jose Snyder states @ 2 weeks ago he increase to 40 mg twice a day - due to ankle swelling. Jose Snyder states for the last 3 days swelling was not decreasing -so he increased to 40 mg 3 x a day ( 6am-12 noon-4 pm) Per Jose Snyder,swelling is not up during the day and increase a little a night. RN asked if Jose Snyder increased potassium during this period. He states no.Jose Snyder wanted to know what Dr Stanford Breed thought?  RN informed Jose Snyder to go to lab today for BMP. Will defer to Dr Stanford Breed Jose Snyder would be out of house this afternoon.,can leave message on voicemail.

## 2014-07-24 ENCOUNTER — Telehealth: Payer: Self-pay | Admitting: Cardiology

## 2014-07-24 NOTE — Telephone Encounter (Signed)
Returning call from yesterday,concerning his test results. He said no name was left.

## 2014-07-24 NOTE — Telephone Encounter (Signed)
Patient notified of lab results

## 2014-07-31 ENCOUNTER — Encounter: Payer: Self-pay | Admitting: *Deleted

## 2014-08-03 ENCOUNTER — Ambulatory Visit: Payer: Medicare Other | Admitting: Nurse Practitioner

## 2014-08-04 ENCOUNTER — Encounter: Payer: Self-pay | Admitting: Physician Assistant

## 2014-08-04 ENCOUNTER — Ambulatory Visit (INDEPENDENT_AMBULATORY_CARE_PROVIDER_SITE_OTHER): Payer: Medicare Other | Admitting: Physician Assistant

## 2014-08-04 VITALS — BP 140/60 | HR 85 | Ht 74.0 in | Wt 269.0 lb

## 2014-08-04 DIAGNOSIS — I4891 Unspecified atrial fibrillation: Secondary | ICD-10-CM

## 2014-08-04 DIAGNOSIS — Z9889 Other specified postprocedural states: Secondary | ICD-10-CM | POA: Diagnosis not present

## 2014-08-04 DIAGNOSIS — I10 Essential (primary) hypertension: Secondary | ICD-10-CM | POA: Diagnosis not present

## 2014-08-04 DIAGNOSIS — I2510 Atherosclerotic heart disease of native coronary artery without angina pectoris: Secondary | ICD-10-CM | POA: Diagnosis not present

## 2014-08-04 DIAGNOSIS — I5042 Chronic combined systolic (congestive) and diastolic (congestive) heart failure: Secondary | ICD-10-CM

## 2014-08-04 DIAGNOSIS — I428 Other cardiomyopathies: Secondary | ICD-10-CM

## 2014-08-04 DIAGNOSIS — I429 Cardiomyopathy, unspecified: Secondary | ICD-10-CM

## 2014-08-04 DIAGNOSIS — Z8679 Personal history of other diseases of the circulatory system: Secondary | ICD-10-CM

## 2014-08-04 MED ORDER — FUROSEMIDE 40 MG PO TABS: 40.0000 mg | ORAL_TABLET | Freq: Two times a day (BID) | ORAL | Status: DC

## 2014-08-04 NOTE — Assessment & Plan Note (Signed)
[place] recent ejection fraction had normalized in December 2015. I discussed repeating the echo with his recent symptoms of heart failure. Patient would like to hold off for now which I think is reasonable.

## 2014-08-04 NOTE — Patient Instructions (Addendum)
Medication Instructions:   TAKE LASIX 40 MG TWICE A DAY TAKE AN EXTRA DOSE IF SWELLING  Labwork:   Testing/Procedures:   Follow-Up:  WITH DR CRENSHAW   2 TO 3 MONTHS IF NOT RECALL   Any Other Special Instructions Will Be Listed Below (If Applicable).  Low-Sodium Eating Plan Sodium raises blood pressure and causes water to be held in the body. Getting less sodium from food will help lower your blood pressure, reduce any swelling, and protect your heart, liver, and kidneys. We get sodium by adding salt (sodium chloride) to food. Most of our sodium comes from canned, boxed, and frozen foods. Restaurant foods, fast foods, and pizza are also very high in sodium. Even if you take medicine to lower your blood pressure or to reduce fluid in your body, getting less sodium from your food is important. WHAT IS MY PLAN? Most people should limit their sodium intake to 2,300 mg a day. Your health care provider recommends that you limit your sodium intake to __________ a day.  WHAT DO I NEED TO KNOW ABOUT THIS EATING PLAN? For the low-sodium eating plan, you will follow these general guidelines:  Choose foods with a % Daily Value for sodium of less than 5% (as listed on the food label).   Use salt-free seasonings or herbs instead of table salt or sea salt.   Check with your health care provider or pharmacist before using salt substitutes.   Eat fresh foods.  Eat more vegetables and fruits.  Limit canned vegetables. If you do use them, rinse them well to decrease the sodium.   Limit cheese to 1 oz (28 g) per day.   Eat lower-sodium products, often labeled as "lower sodium" or "no salt added."  Avoid foods that contain monosodium glutamate (MSG). MSG is sometimes added to [place] food and some canned foods.  Check food labels (Nutrition Facts labels) on foods to learn how much sodium is in one serving.  Eat more home-cooked food and less restaurant, buffet, and fast food.  When  eating at a restaurant, ask that your food be prepared with less salt or none, if possible.  HOW DO I READ FOOD LABELS FOR SODIUM INFORMATION? The Nutrition Facts label lists the amount of sodium in one serving of the food. If you eat more than one serving, you must multiply the listed amount of sodium by the number of servings. Food labels may also identify foods as:  Sodium free--Less than 5 mg in a serving.  Very low sodium--35 mg or less in a serving.  Low sodium--140 mg or less in a serving.  Light in sodium--50% less sodium in a serving. For example, if a food that usually has 300 mg of sodium is changed to become light in sodium, it will have 150 mg of sodium.  Reduced sodium--25% less sodium in a serving. For example, if a food that usually has 400 mg of sodium is changed to reduced sodium, it will have 300 mg of sodium. WHAT FOODS CAN I EAT? Grains Low-sodium cereals, including oats, puffed wheat and rice, and shredded wheat cereals. Low-sodium crackers. Unsalted rice and pasta. Lower-sodium bread.  Vegetables Frozen or fresh vegetables. Low-sodium or reduced-sodium canned vegetables. Low-sodium or reduced-sodium tomato sauce and paste. Low-sodium or reduced-sodium tomato and vegetable juices.  Fruits Fresh, frozen, and canned fruit. Fruit juice.  Meat and Other Protein Products Low-sodium canned tuna and salmon. Fresh or frozen meat, poultry, seafood, and fish. Lamb. Unsalted nuts. Dried beans, peas,  and lentils without added salt. Unsalted canned beans. Homemade soups without salt. Eggs.  Dairy Milk. Soy milk. Ricotta cheese. Low-sodium or reduced-sodium cheeses. Yogurt.  Condiments Fresh and dried herbs and spices. Salt-free seasonings. Onion and garlic powders. Low-sodium varieties of mustard and ketchup. Lemon juice.  Fats and Oils Reduced-sodium salad dressings. Unsalted butter.  Other Unsalted popcorn and pretzels.  The items listed above may not be a  complete list of recommended foods or beverages. Contact your dietitian for more options. WHAT FOODS ARE NOT RECOMMENDED? Grains Instant hot cereals. Bread stuffing, pancake, and biscuit mixes. Croutons. Seasoned rice or pasta mixes. Noodle soup cups. Boxed or frozen macaroni and cheese. Self-rising flour. Regular salted crackers. Vegetables Regular canned vegetables. Regular canned tomato sauce and paste. Regular tomato and vegetable juices. Frozen vegetables in sauces. Salted french fries. Olives. Angie Fava. Relishes. Sauerkraut. Salsa. Meat and Other Protein Products Salted, canned, smoked, spiced, or pickled meats, seafood, or fish. Bacon, ham, sausage, hot dogs, corned beef, chipped beef, and packaged luncheon meats. Salt pork. Jerky. Pickled herring. Anchovies, regular canned tuna, and sardines. Salted nuts. Dairy Processed cheese and cheese spreads. Cheese curds. Blue cheese and cottage cheese. Buttermilk.  Condiments Onion and garlic salt, seasoned salt, table salt, and sea salt. Canned and packaged gravies. Worcestershire sauce. Tartar sauce. Barbecue sauce. Teriyaki sauce. Soy sauce, including reduced sodium. Steak sauce. Fish sauce. Oyster sauce. Cocktail sauce. Horseradish. Regular ketchup and mustard. Meat flavorings and tenderizers. Bouillon cubes. Hot sauce. Tabasco sauce. Marinades. Taco seasonings. Relishes. Fats and Oils Regular salad dressings. Salted butter. Margarine. Ghee. Bacon fat.  Other Potato and tortilla chips. Corn chips and puffs. Salted popcorn and pretzels. Canned or dried soups. Pizza. Frozen entrees and pot pies.  The items listed above may not be a complete list of foods and beverages to avoid. Contact your dietitian for more information. Document Released: 08/05/2001 Document Revised: 02/18/2013 Document Reviewed: 12/18/2012 [facility] Patient Information 2015 [place], [place]. This information is not intended to replace advice given to you by your health  care provider. Make sure you discuss any questions you have with your health care provider.

## 2014-08-04 NOTE — Assessment & Plan Note (Signed)
Patient has had increase edema over the past 2 months because of excess of salt and alcohol intake. He is improving with increase in his Lasix. I talked about getting another 2-D echo to see if he has worsening LV function since December but he'd like to hold off on this. He is convinced it's all diet related and is willing to get back on track. In TIMI Lasix 40 mg once daily and when necessary for edema Follow-up with Dr. Stanford Breed.

## 2014-08-04 NOTE — Assessment & Plan Note (Signed)
Maintaining normal sinus rhythm 

## 2014-08-04 NOTE — Assessment & Plan Note (Signed)
Creatinine stable at 1.22 on 07/22/14

## 2014-08-04 NOTE — Progress Notes (Signed)
Cardiology Office Note   Date:  08/04/2014   ID:  Sergio Zawislak, DOB 19-Nov-1940, MRN [id number]  PCP:  Verline Lema, MD  Cardiologist:  Dr. Stanford Breed  Chief Complaint:swelling    History of Present Illness: Jose Snyder is a 74 y.o. male who presents for follow up of swelling.  He complains of 2 week history of worsening leg edema. He was eating a lot of salt and drinking too much alcohol and fluid. He's had 2 months of bad diet with sauerkraut, chips and drinking a lot of liquor. He won't tell me how much. He is increased his Lasix to 40 mg twice a day and trying to eat better and decrease his alcohol intake. His edema has improved. He was also having some degree of dyspnea on exertion. This is improving as well.  Patient has had history of atrial fibrillation and underwent surgical maze procedure by Dr. Alvan Dame. He has a nonischemic cardiomyopathy EF 20-25% on echo in 05/2013. Repeat echo in 01/2014 EF improved to 50-55%. He had nonobstructive CAD at cath. CTA in August 2015 showed a possible small focal dissection or penetrating ulcer in the descending thoracic aorta. There was no evidence of AAA.   Past Medical History  Diagnosis Date  . Hypertension   . Obesity   . GERD (gastroesophageal reflux disease)   . Chronic combined systolic and diastolic heart failure 07/05/3265    a.  Echo (06/25/13):  EF 20-25%, diff HK, restrictive physio, mild MR, mod to severe LAE, mild RVE, mildly reduced RVSF, mod to severe RAE  . NICM (nonischemic cardiomyopathy) with EF 20-25% 06/26/2013  . Atrial Fibrillation  06/25/2013  . Dyslipidemia 07/15/2013  . CAD (coronary artery disease), native coronary artery 06/28/2013    a. LHC (06/27/13):  Mid LAD 40%, mid D1 75%, mid CFX 50-60%, mid RCA 50%, EF 20%, LVEDP 24 mmHg.  Marland Kitchen Penetrating atherosclerotic ulcer of aorta 10/22/2013    Small penetrating ulcer of descending thoracic aorta discovered on routine CTA  . Shortness of breath     seen by [place], & low energy   .  CHF (congestive heart failure)   . Dysrhythmia     a-fib  . Anxiety   . Arthritis     R hand- OA  . Prostate cancer     watchful waiting, no treatment so far  . S/P Maze operation for atrial fibrillation 10/29/2013    Complete bilateral atrial lesion set using cryothermy and bipolar radiofrequency ablation with clipping of LA appendage via right mini thoracotomy  . Chronic kidney disease   . Incidental pulmonary nodule 06/22/2014    Seen on CT angiogram of chest    Past Surgical History  Procedure Laterality Date  . Medial partial knee replacement Left 1990's?  . Transurethral resection of prostate  2010  . Tee without cardioversion N/A 06/30/2013    Procedure: TRANSESOPHAGEAL ECHOCARDIOGRAM (TEE) ;  Surgeon: [name], MD;  Location: [facility];  Service: Cardiovascular;  Laterality: N/A;  . Cardioversion N/A 06/30/2013    Procedure: CARDIOVERSION;  Surgeon: [name], MD;  Location: [facility];  Service: Cardiovascular;  Laterality: N/A;  . Cardioversion N/A 07/31/2013    Procedure: CARDIOVERSION;  Surgeon: Thayer Headings, MD;  Location: [facility];  Service: Cardiovascular;  Laterality: N/A;  . Cardiac catheterization  06/2013  . Minimally invasive maze procedure N/A 10/29/2013    Procedure: MINIMALLY INVASIVE MAZE PROCEDURE;  Surgeon: Rexene Alberts, MD;  Location: [facility] [facility] [place];  Service: Open  Heart Surgery;  Laterality: N/A;  . Intraoperative transesophageal echocardiogram N/A 10/29/2013    Procedure: INTRAOPERATIVE TRANSESOPHAGEAL ECHOCARDIOGRAM;  Surgeon: Rexene Alberts, MD;  Location: [facility];  Service: Open Heart Surgery;  Laterality: N/A;  . Clipping of atrial appendage  10/29/2013    Procedure: CLIPPING OF ATRIAL APPENDAGE;  Surgeon: Rexene Alberts, MD;  Location: [facility];  Service: Open Heart Surgery;;  . Left and right heart catheterization with coronary angiogram N/A 06/27/2013    Procedure: LEFT AND RIGHT HEART CATHETERIZATION WITH CORONARY ANGIOGRAM;  Surgeon: Jettie Booze, MD;  Location: [facility] CATH LAB;  Service: Cardiovascular;  Laterality: N/A;     Current Outpatient Prescriptions  Medication Sig Dispense Refill  . apixaban (ELIQUIS) 5 MG TABS tablet Take 1 tablet (5 mg total) by mouth 2 (two) times daily. 60 tablet 11  . atorvastatin (LIPITOR) 20 MG tablet Take 20 mg by mouth daily with breakfast.    . Cholecalciferol (VITAMIN D) 2000 UNITS tablet Take 2,000 Units by mouth daily.    . fluticasone (FLONASE) 50 MCG/ACT nasal spray Place 1 spray into both nostrils daily as needed for allergies or rhinitis.     . furosemide (LASIX) 40 MG tablet Take 40 mg by mouth daily with breakfast. If swelling in wrist takes an extra dose    . lisinopril (PRINIVIL,ZESTRIL) 20 MG tablet Take 1 tablet (20 mg total) by mouth daily with breakfast. 30 tablet 12  . metoprolol succinate (TOPROL-XL) 50 MG 24 hr tablet TAKE ONE AND ONE HALF TABLET ONCE DAILY Take with or immediately following a meal. 45 tablet 12  . omeprazole (PRILOSEC) 20 MG capsule Take 20 mg by mouth daily.    . potassium chloride SA (K-DUR,KLOR-CON) 20 MEQ tablet Take 1 tablet (20 mEq total) by mouth daily. 30 tablet 11  . rOPINIRole (REQUIP) 0.5 MG tablet Take 0.5 mg by mouth 2 (two) times daily.    . temazepam (RESTORIL) 15 MG capsule Take 15 mg by mouth at bedtime as needed.      No current facility-administered medications for this visit.    Allergies:   Review of patient's allergies indicates no known allergies.    Social History:  The patient  reports that he has never smoked. He has never used smokeless tobacco. He reports that he drinks about 8.4 oz of alcohol per week. He reports that he does not use illicit drugs.   Family History:  The patient's    family history includes Healthy in his father, mother, and sister.    ROS:  Please see the history of present illness.   Otherwise, review of systems are positive for none.   All other systems are reviewed and negative.    PHYSICAL EXAM: VS:   BP 140/60 mmHg  Pulse 85  Ht 6\' 2"  (1.88 m)  Wt 269 lb (122.018 kg)  BMI 34.52 kg/m2  SpO2 94% , BMI Body mass index is 34.52 kg/(m^2). GEN: Obese, well developed, in no acute distress Neck: no JVD, HJR, carotid bruits, or masses Cardiac:  RRR; no murmurs,gallop, rubs, thrill or heave,  Respiratory:  clear to auscultation bilaterally, normal work of breathing GI: soft, nontender, nondistended, + BS MS: no deformity or atrophy Extremities: 1 edema bilaterally up to his knees without cyanosis, clubbing,  good distal pulses bilaterally.  Skin: warm and dry, no rash Neuro:  Strength and sensation are intact    EKG:  EKG is ordered today. The ekg ordered today demonstrates  normal sinus  rhythm no acute change   Recent Labs: 10/27/2013: ALT 15 10/30/2013: Magnesium 2.4 04/20/2014: Hemoglobin 13.7; Platelets 218 07/22/2014: BUN 25*; Creatinine 1.22; Potassium 4.3; Sodium 139    Lipid Panel    Component Value Date/Time   CHOL 142 06/26/2013 0520   TRIG 139 06/26/2013 0520   HDL 33* 06/26/2013 0520   CHOLHDL 4.3 06/26/2013 0520   VLDL 28 06/26/2013 0520   LDLCALC 81 06/26/2013 0520      Wt Readings from Last 3 Encounters:  08/04/14 269 lb (122.018 kg)  06/22/14 264 lb (119.75 kg)  04/08/14 256 lb (116.121 kg)      Other studies Reviewed: Additional studies/ records that were reviewed today include and review of the records demonstrates: 2D Echo 01/2014 Study Conclusions  - Left ventricle: The cavity size was normal. Wall thickness was   increased in a pattern of mild LVH. Systolic function was normal.   The estimated ejection fraction was in the range of 50% to 55%.   Wall motion was normal; there were no regional wall motion   abnormalities. - Aortic valve: Mildly calcified annulus. - Left atrium: The atrium was mildly dilated.  IMPRESSION: Abnormal Lexiscan Myoview stress test. Small apical perfusion defect on rest and stress images consistent with old scar. No  reversible ischemia is seen. There is left ventricular dysfunction with ejection fraction 31% and apical hypokinesis.     Electronically Signed   By: Darlin Coco M.D.   On: 06/26/2013 14:33     ASSESSMENT AND PLAN:  Chronic combined systolic and diastolic heart failure Patient has had increase edema over the past 2 months because of excess of salt and alcohol intake. He is improving with increase in his Lasix. I talked about getting another 2-D echo to see if he has worsening LV function since December but he'd like to hold off on this. He is convinced it's all diet related and is willing to get back on track. In TIMI Lasix 40 mg once daily and when necessary for edema Follow-up with Dr. Stanford Breed.   HTN (hypertension) Blood Pressure stable   Chronic kidney disease Creatinine stable at 1.22 on 07/22/14   S/P Maze operation for atrial fibrillation Maintaining normal sinus rhythm   NICM (nonischemic cardiomyopathy) with EF 20-25% [place] recent ejection fraction had normalized in December 2015. I discussed repeating the echo with his recent symptoms of heart failure. Patient would like to hold off for now which I think is reasonable.     Sumner Boast, PA-C  08/04/2014 10:59 AM    [facility] [address], [place], [place]  [address] Phone: [phone]; Fax: [phone]

## 2014-08-04 NOTE — Assessment & Plan Note (Signed)
Blood Pressure stable 

## 2014-08-17 DIAGNOSIS — C61 Malignant neoplasm of prostate: Secondary | ICD-10-CM | POA: Diagnosis not present

## 2014-08-24 ENCOUNTER — Other Ambulatory Visit: Payer: Self-pay

## 2014-08-24 DIAGNOSIS — N5201 Erectile dysfunction due to arterial insufficiency: Secondary | ICD-10-CM | POA: Diagnosis not present

## 2014-08-24 DIAGNOSIS — C61 Malignant neoplasm of prostate: Secondary | ICD-10-CM | POA: Diagnosis not present

## 2014-08-24 DIAGNOSIS — N481 Balanitis: Secondary | ICD-10-CM | POA: Diagnosis not present

## 2014-08-25 ENCOUNTER — Telehealth: Payer: Self-pay | Admitting: Cardiology

## 2014-08-25 NOTE — Telephone Encounter (Signed)
Received records from [facility] for appointment on 10/14/14 with Dr Stanford Breed @ [facility] [place].  Records given to [facility] (medical records) for Dr Jacalyn Lefevre schedule on 10/14/14. lp

## 2014-10-12 NOTE — Progress Notes (Signed)
HPI: FU atrial fibrillation. Echo 4/15 EF 20-25%. LHC demonstrated mod non-obs CAD. Most significant lesion was a mid D1 with 75% stenosis. DCM was felt to be out of proportion to CAD (NICM). Had DCCV on amiodarone but did not hold sinus. He was seen by Dr. Rayann Heman and referred to Dr. Roxy Manns for surgical maze. Carotid Dopplers August 2015 showed 1-39% bilateral stenosis. CTA August 2015 showed possible small focal dissection or penetrating ulcer in the descending thoracic aorta. No evidence of abdominal aortic aneurysm. Patient underwent maze procedure with clipping of left atrial appendage on October 29, 2013. Echocardiogram repeated December 2015 and showed improvement in LV function. Ejection fraction 50-55% and mild left atrial enlargement. Chest CT April 2016 showed unchanged penetrating atherosclerotic ulcer. There were biapical ground glass nodules as well. This is followed by Dr. Roxy Manns. Seen 6/16 with volume excess. Since last seen, the patient has dyspnea with more extreme activities but not with routine activities. It is relieved with rest. It is not associated with chest pain. There is no orthopnea, PND or pedal edema. There is no syncope or palpitations. There is no exertional chest pain.   Studies:  - LHC (06/27/13): Mid LAD 40%, mid D1 75%, mid CFX 50-60%, mid RCA 50%, EF 20%, LVEDP 24 mmHg.   Current Outpatient Prescriptions  Medication Sig Dispense Refill  . apixaban (ELIQUIS) 5 MG TABS tablet Take 1 tablet (5 mg total) by mouth 2 (two) times daily. 60 tablet 11  . atorvastatin (LIPITOR) 20 MG tablet Take 20 mg by mouth daily with breakfast.    . betamethasone dipropionate (DIPROLENE) 0.05 % ointment Apply topically 2 (two) times daily.    . Cholecalciferol (VITAMIN D) 2000 UNITS tablet Take 2,000 Units by mouth daily.    Marland Kitchen docusate sodium (COLACE) 100 MG capsule Take 100 mg by mouth daily as needed for mild constipation.    . fluticasone (FLONASE) 50 MCG/ACT nasal spray Place 1  spray into both nostrils daily as needed for allergies or rhinitis.     . furosemide (LASIX) 40 MG tablet Take 1 tablet (40 mg total) by mouth 2 (two) times daily. If swelling takes an extra dose 60 tablet 6  . lisinopril (PRINIVIL,ZESTRIL) 20 MG tablet Take 1 tablet (20 mg total) by mouth daily with breakfast. 30 tablet 12  . metoprolol succinate (TOPROL-XL) 50 MG 24 hr tablet TAKE ONE AND ONE HALF TABLET ONCE DAILY Take with or immediately following a meal. 45 tablet 12  . omeprazole (PRILOSEC) 20 MG capsule Take 20 mg by mouth daily.    . potassium chloride SA (K-DUR,KLOR-CON) 20 MEQ tablet Take 1 tablet (20 mEq total) by mouth daily. 30 tablet 11  . rOPINIRole (REQUIP) 0.5 MG tablet Take 0.5 mg by mouth 2 (two) times daily.    . temazepam (RESTORIL) 15 MG capsule Take 15 mg by mouth at bedtime as needed.      No current facility-administered medications for this visit.     Past Medical History  Diagnosis Date  . Hypertension   . Obesity   . GERD (gastroesophageal reflux disease)   . Chronic combined systolic and diastolic heart failure 0/92/3300    a.  Echo (06/25/13):  EF 20-25%, diff HK, restrictive physio, mild MR, mod to severe LAE, mild RVE, mildly reduced RVSF, mod to severe RAE  . NICM (nonischemic cardiomyopathy) with EF 20-25% 06/26/2013  . Atrial Fibrillation  06/25/2013  . Dyslipidemia 07/15/2013  . CAD (coronary artery disease), native  coronary artery 06/28/2013    a. LHC (06/27/13):  Mid LAD 40%, mid D1 75%, mid CFX 50-60%, mid RCA 50%, EF 20%, LVEDP 24 mmHg.  Marland Kitchen Penetrating atherosclerotic ulcer of aorta 10/22/2013    Small penetrating ulcer of descending thoracic aorta discovered on routine CTA  . Shortness of breath     seen by [place], & low energy   . CHF (congestive heart failure)   . Dysrhythmia     a-fib  . Anxiety   . Arthritis     R hand- OA  . Prostate cancer     watchful waiting, no treatment so far  . S/P Maze operation for atrial fibrillation 10/29/2013     Complete bilateral atrial lesion set using cryothermy and bipolar radiofrequency ablation with clipping of LA appendage via right mini thoracotomy  . Chronic kidney disease   . Incidental pulmonary nodule 06/22/2014    Seen on CT angiogram of chest    Past Surgical History  Procedure Laterality Date  . Medial partial knee replacement Left 1990's?  . Transurethral resection of prostate  2010  . Tee without cardioversion N/A 06/30/2013    Procedure: TRANSESOPHAGEAL ECHOCARDIOGRAM (TEE) ;  Surgeon: [name], MD;  Location: [facility];  Service: Cardiovascular;  Laterality: N/A;  . Cardioversion N/A 06/30/2013    Procedure: CARDIOVERSION;  Surgeon: [name], MD;  Location: [facility];  Service: Cardiovascular;  Laterality: N/A;  . Cardioversion N/A 07/31/2013    Procedure: CARDIOVERSION;  Surgeon: Thayer Headings, MD;  Location: [facility];  Service: Cardiovascular;  Laterality: N/A;  . Cardiac catheterization  06/2013  . Minimally invasive maze procedure N/A 10/29/2013    Procedure: MINIMALLY INVASIVE MAZE PROCEDURE;  Surgeon: Rexene Alberts, MD;  Location: [place] [facility];  Service: Open Heart Surgery;  Laterality: N/A;  . Intraoperative transesophageal echocardiogram N/A 10/29/2013    Procedure: INTRAOPERATIVE TRANSESOPHAGEAL ECHOCARDIOGRAM;  Surgeon: Rexene Alberts, MD;  Location: [facility];  Service: Open Heart Surgery;  Laterality: N/A;  . Clipping of atrial appendage  10/29/2013    Procedure: CLIPPING OF ATRIAL APPENDAGE;  Surgeon: Rexene Alberts, MD;  Location: [facility];  Service: Open Heart Surgery;;  . Left and right heart catheterization with coronary angiogram N/A 06/27/2013    Procedure: LEFT AND RIGHT HEART CATHETERIZATION WITH CORONARY ANGIOGRAM;  Surgeon: Jettie Booze, MD;  Location: [facility] CATH LAB;  Service: Cardiovascular;  Laterality: N/A;    Social History   Social History  . Marital Status: Widowed    Spouse Name: N/A  . Number of Children: N/A  . Years of Education: N/A    Occupational History  . Not on file.   Social History Main Topics  . Smoking status: Never Smoker   . Smokeless tobacco: Never Used     Comment: 06/25/2013 "smoked very light; years and years ago"  . Alcohol Use: 8.4 oz/week    7 Glasses of wine, 7 Shots of liquor per week  . Drug Use: No  . Sexual Activity: Not Currently   Other Topics Concern  . Not on file   Social History Narrative   [place] lives in [place]. He is widowed. Wife passed of breast cancer last year. No children.     ROS: no fevers or chills, productive cough, hemoptysis, dysphasia, odynophagia, melena, hematochezia, dysuria, hematuria, rash, seizure activity, orthopnea, PND, pedal edema, claudication. Remaining systems are negative.  Physical Exam: Well-developed well-nourished in no acute distress.  Skin is warm and dry.  HEENT  is normal.  Neck is supple.  Chest is clear to auscultation with normal expansion.  Cardiovascular exam is regular rate and rhythm.  Abdominal exam nontender or distended. No masses palpated. Extremities show no edema. neuro grossly intact  ECG 08/04/2014-sinus rhythm, right axis deviation.

## 2014-10-14 ENCOUNTER — Encounter: Payer: Self-pay | Admitting: Cardiology

## 2014-10-14 ENCOUNTER — Ambulatory Visit (INDEPENDENT_AMBULATORY_CARE_PROVIDER_SITE_OTHER): Payer: Medicare Other | Admitting: Cardiology

## 2014-10-14 VITALS — BP 144/84 | HR 98 | Ht 74.0 in | Wt 270.0 lb

## 2014-10-14 DIAGNOSIS — I4891 Unspecified atrial fibrillation: Secondary | ICD-10-CM

## 2014-10-14 DIAGNOSIS — I719 Aortic aneurysm of unspecified site, without rupture: Secondary | ICD-10-CM

## 2014-10-14 DIAGNOSIS — I10 Essential (primary) hypertension: Secondary | ICD-10-CM

## 2014-10-14 DIAGNOSIS — I2510 Atherosclerotic heart disease of native coronary artery without angina pectoris: Secondary | ICD-10-CM

## 2014-10-14 DIAGNOSIS — E785 Hyperlipidemia, unspecified: Secondary | ICD-10-CM

## 2014-10-14 DIAGNOSIS — I429 Cardiomyopathy, unspecified: Secondary | ICD-10-CM | POA: Diagnosis not present

## 2014-10-14 DIAGNOSIS — I700 Atherosclerosis of aorta: Secondary | ICD-10-CM

## 2014-10-14 DIAGNOSIS — I428 Other cardiomyopathies: Secondary | ICD-10-CM

## 2014-10-14 LAB — CBC
HCT: 38.8 % — ABNORMAL LOW (ref 39.0–52.0)
Hemoglobin: 13.1 g/dL (ref 13.0–17.0)
MCH: 31.1 pg (ref 26.0–34.0)
MCHC: 33.8 g/dL (ref 30.0–36.0)
MCV: 92.2 fL (ref 78.0–100.0)
MPV: 12.1 fL (ref 8.6–12.4)
PLATELETS: 201 10*3/uL (ref 150–400)
RBC: 4.21 MIL/uL — ABNORMAL LOW (ref 4.22–5.81)
RDW: 13.8 % (ref 11.5–15.5)
WBC: 6.9 10*3/uL (ref 4.0–10.5)

## 2014-10-14 NOTE — Assessment & Plan Note (Signed)
Continue statin. 

## 2014-10-14 NOTE — Assessment & Plan Note (Signed)
No change on most recent CT. Followed by Dr. Roxy Manns.

## 2014-10-14 NOTE — Assessment & Plan Note (Signed)
Improved almost recent echocardiogram. Continue ACE inhibitor and beta blocker. His volume status has improved after initiating low-sodium diet. Continue present dose of diabetics. Check potassium and renal function.

## 2014-10-14 NOTE — Assessment & Plan Note (Signed)
Blood pressure is borderline. However he is following this at home and it is typically controlled. Continue present medications and follow. I would like for his systolic to be less than 170 and his diastolic to be less than 90.

## 2014-10-14 NOTE — Patient Instructions (Signed)
Your physician wants you to follow-up in: 6 MONTHS WITH DR CRENSHAW You will receive a reminder letter in the mail two months in advance. If you don't receive a letter, please call our office to schedule the follow-up appointment.  

## 2014-10-14 NOTE — Assessment & Plan Note (Signed)
Patient remains in sinus rhythm on examination. Continue beta blocker. Continue apixaban. Check hemoglobin and renal function.

## 2014-10-14 NOTE — Assessment & Plan Note (Signed)
Continue statin. Not on aspirin given need for anticoagulation. 

## 2014-10-15 LAB — BASIC METABOLIC PANEL
BUN: 28 mg/dL — AB (ref 7–25)
CO2: 22 mmol/L (ref 20–31)
CREATININE: 1.42 mg/dL — AB (ref 0.70–1.18)
Calcium: 9.3 mg/dL (ref 8.6–10.3)
Chloride: 100 mmol/L (ref 98–110)
Glucose, Bld: 104 mg/dL — ABNORMAL HIGH (ref 65–99)
Potassium: 5 mmol/L (ref 3.5–5.3)
Sodium: 137 mmol/L (ref 135–146)

## 2014-10-22 ENCOUNTER — Ambulatory Visit (INDEPENDENT_AMBULATORY_CARE_PROVIDER_SITE_OTHER): Payer: Medicare Other | Admitting: Podiatry

## 2014-10-22 ENCOUNTER — Encounter: Payer: Self-pay | Admitting: Podiatry

## 2014-10-22 DIAGNOSIS — B351 Tinea unguium: Secondary | ICD-10-CM | POA: Diagnosis not present

## 2014-10-22 DIAGNOSIS — M79673 Pain in unspecified foot: Secondary | ICD-10-CM

## 2014-10-22 NOTE — Progress Notes (Signed)
Patient ID: Jose Snyder, male   DOB: [birth year], 74 y.o.   MRN: [id number] Complaint:  Visit Type: Patient returns to my office for continued preventative foot care services. Complaint: Patient states" my nails have grown long and thick and become painful to walk and wear shoes. The patient presents for preventative foot care services. No changes to ROS  Podiatric Exam: Vascular: dorsalis pedis and posterior tibial pulses are palpable bilateral. Capillary return is immediate. Temperature gradient is WNL. Skin turgor WNL  Sensorium: Normal Semmes Weinstein monofilament test. Normal tactile sensation bilaterally. Nail Exam: Pt has thick disfigured discolored nails with subungual debris noted bilateral entire nail hallux through fifth toenails Ulcer Exam: There is no evidence of ulcer or pre-ulcerative changes or infection. Orthopedic Exam: Muscle tone and strength are WNL. No limitations in general ROM. No crepitus or effusions noted. Foot type and digits show no abnormalities. Bony prominences are unremarkable. Skin: No Porokeratosis. No infection or ulcers  Diagnosis:  Onychomycosis, , Pain in right toe, pain in left toes  Treatment & Plan Procedures and Treatment: Consent by patient was obtained for treatment procedures. The patient understood the discussion of treatment and procedures well. All questions were answered thoroughly reviewed. Debridement of mycotic and hypertrophic toenails, 1 through 5 bilateral and clearing of subungual debris. No ulceration, no infection noted.  Return Visit-Office Procedure: Patient instructed to return to the office for a follow up visit 3 months for continued evaluation and treatment.

## 2014-11-23 ENCOUNTER — Encounter: Payer: Self-pay | Admitting: Thoracic Surgery (Cardiothoracic Vascular Surgery)

## 2014-11-23 ENCOUNTER — Ambulatory Visit (INDEPENDENT_AMBULATORY_CARE_PROVIDER_SITE_OTHER): Payer: Medicare Other | Admitting: Thoracic Surgery (Cardiothoracic Vascular Surgery)

## 2014-11-23 VITALS — BP 148/84 | HR 90 | Resp 18 | Ht 74.0 in | Wt 268.0 lb

## 2014-11-23 DIAGNOSIS — Z9889 Other specified postprocedural states: Secondary | ICD-10-CM | POA: Diagnosis not present

## 2014-11-23 DIAGNOSIS — I482 Chronic atrial fibrillation, unspecified: Secondary | ICD-10-CM

## 2014-11-23 DIAGNOSIS — I700 Atherosclerosis of aorta: Secondary | ICD-10-CM

## 2014-11-23 DIAGNOSIS — R911 Solitary pulmonary nodule: Secondary | ICD-10-CM

## 2014-11-23 DIAGNOSIS — I719 Aortic aneurysm of unspecified site, without rupture: Secondary | ICD-10-CM

## 2014-11-23 DIAGNOSIS — Z8679 Personal history of other diseases of the circulatory system: Secondary | ICD-10-CM

## 2014-11-23 DIAGNOSIS — I2510 Atherosclerotic heart disease of native coronary artery without angina pectoris: Secondary | ICD-10-CM

## 2014-11-23 NOTE — Progress Notes (Signed)
[address]       [place] [address]             [phone]     CARDIOTHORACIC SURGERY OFFICE NOTE  Referring Ariona Deschene is Thompson Grayer, MD  Primary Cardiologist is Lelon Perla, MD PCP is Verline Lema, MD   HPI:  Patient returns for routine follow-up and rhythm check approximately one year status post minimally invasive maze procedure on 10/29/2013.  He was last seen here in our office on 06/22/2014. Since then he has continued to do very well. He has been followed intermittently by Dr. Stanford Breed last saw him on 10/14/2014. He continues to maintain sinus rhythm. He remains chronically anticoagulated using Eliquis.  The patient states that he feels quite well. He describes stable symptoms of exertional shortness of breath that occur only with more strenuous physical activity. His exercise tolerance is noticeably much improved in comparison with how he felt prior to surgery. He has not had any recurrent symptoms of palpitations. Overall he feels quite well and noticeably much better than he did prior to surgery.   Current Outpatient Prescriptions  Medication Sig Dispense Refill  . apixaban (ELIQUIS) 5 MG TABS tablet Take 1 tablet (5 mg total) by mouth 2 (two) times daily. 60 tablet 11  . atorvastatin (LIPITOR) 20 MG tablet Take 20 mg by mouth daily with breakfast.    . betamethasone dipropionate (DIPROLENE) 0.05 % ointment Apply topically 2 (two) times daily.    . Cholecalciferol (VITAMIN D) 2000 UNITS tablet Take 2,000 Units by mouth daily.    Marland Kitchen docusate sodium (COLACE) 100 MG capsule Take 100 mg by mouth daily as needed for mild constipation.    . fluticasone (FLONASE) 50 MCG/ACT nasal spray Place 1 spray into both nostrils daily as needed for allergies or rhinitis.     . furosemide (LASIX) 40 MG tablet Take 1 tablet (40 mg total) by mouth 2 (two) times daily. If swelling takes an extra dose 60 tablet 6  . lisinopril (PRINIVIL,ZESTRIL) 20 MG tablet Take 1  tablet (20 mg total) by mouth daily with breakfast. 30 tablet 12  . metoprolol succinate (TOPROL-XL) 50 MG 24 hr tablet TAKE ONE AND ONE HALF TABLET ONCE DAILY Take with or immediately following a meal. 45 tablet 12  . omeprazole (PRILOSEC) 20 MG capsule Take 20 mg by mouth daily.    . potassium chloride SA (K-DUR,KLOR-CON) 20 MEQ tablet Take 1 tablet (20 mEq total) by mouth daily. 30 tablet 11  . rOPINIRole (REQUIP) 0.5 MG tablet Take 0.5 mg by mouth 2 (two) times daily.    . temazepam (RESTORIL) 15 MG capsule Take 15 mg by mouth at bedtime as needed.      No current facility-administered medications for this visit.      Physical Exam:   BP 148/84 mmHg  Pulse 90  Resp 18  Ht 6\' 2"  (1.88 m)  Wt 268 lb (121.564 kg)  BMI 34.39 kg/m2  SpO2 98%  General:  Well-appearing  Chest:   Clear to auscultation  CV:   Regular rate and rhythm  Incisions:  Completely healed  Abdomen:  Soft and nontender  Extremities:  Warm and well-perfused  Diagnostic Tests:  2 channel telemetry rhythm strip demonstrates normal sinus rhythm   Impression:  Patient is doing very well approximately one year status post minimally invasive maze procedure. He continues to maintain sinus rhythm. He describes stable symptoms of exertional shortness of breath consistent  with chronic combined systolic and diastolic congestive heart failure, New York Heart Association functional class II.  Previous CT angiogram has demonstrated groundglass opacities at the apices of both lungs and a penetrating atherosclerotic ulcer in the descending thoracic aorta that requires long-term follow-up.  Plan:  The patient will return in 6 months with repeat CT angiogram of the chest. We have not recommended any changes to the patient's current medications at this time.  I spent in excess of 15 minutes during the conduct of this office consultation and >50% of this time involved direct face-to-face encounter with the patient for counseling  and/or coordination of their care.   Valentina Gu. Roxy Manns, MD 11/23/2014 1:16 PM

## 2014-11-23 NOTE — Patient Instructions (Signed)
Continue all previous medications without any changes at this time  

## 2014-12-28 ENCOUNTER — Ambulatory Visit: Payer: Medicare Other | Admitting: Thoracic Surgery (Cardiothoracic Vascular Surgery)

## 2015-01-05 IMAGING — CT CT CTA ABD/PEL W/CM AND/OR W/O CM
1 of 7 series · 12 of 46 positions shown, 17 images · IV contrast (omnipaque)
Comparison: None.

CLINICAL DATA: History of atrial fibrillation

EXAM:
CT ANGIOGRAPHY CHEST, ABDOMEN AND PELVIS
TECHNIQUE: Multidetector CT imaging through the chest, abdomen and pelvis was
performed using the standard protocol during bolus administration of
intravenous contrast. Multiplanar reconstructed images and MIPs were
obtained and reviewed to evaluate the vascular anatomy.
CONTRAST:  80mL OMNIPAQUE IOHEXOL 350 MG/ML SOLN

[Series 4: angio · axial · 0.90mm/px · z∈[-632,-25]mm · 12 of 281 slices shown, 17 images]
[im 19/281  soft-tissue]
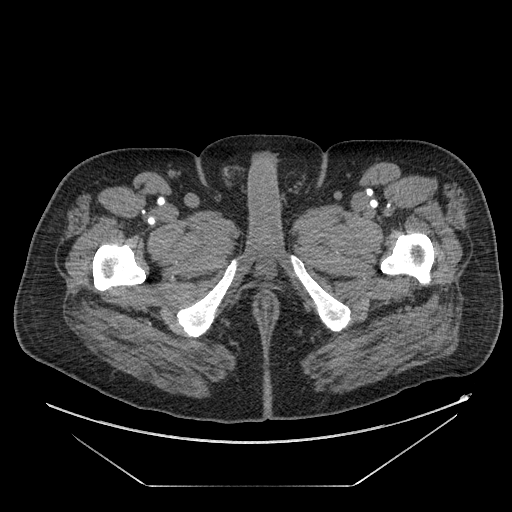
[im 19/281  bone]
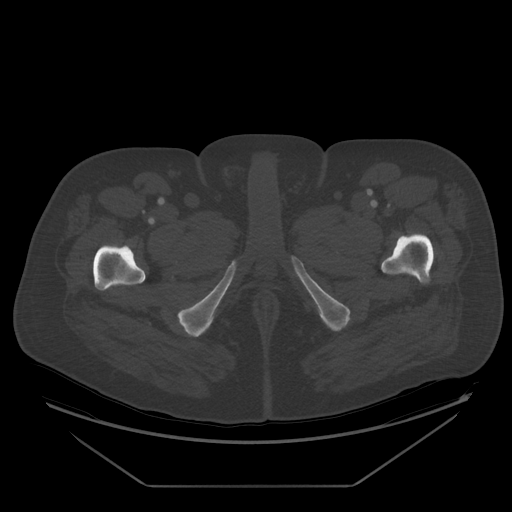
[im 38/281  soft-tissue]
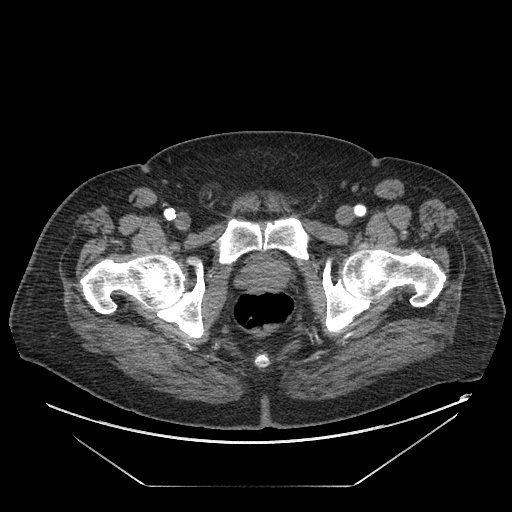
[im 75/281  soft-tissue]
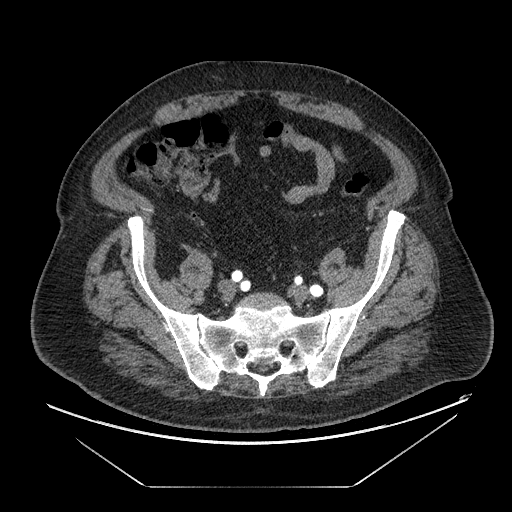
[im 94/281  soft-tissue]
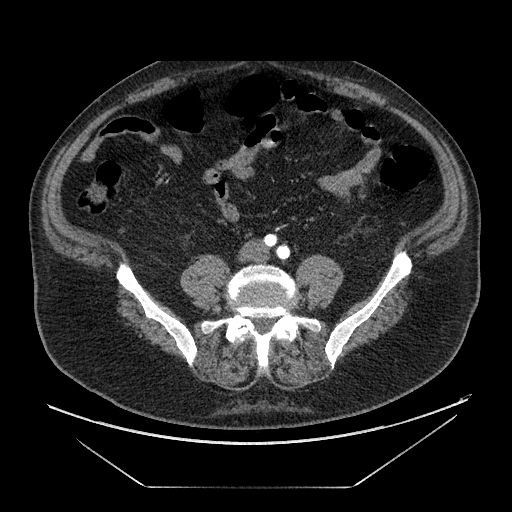
[im 113/281  soft-tissue]
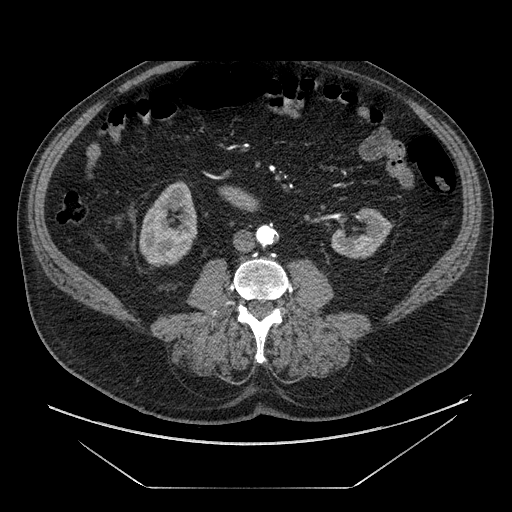
[im 150/281  soft-tissue]
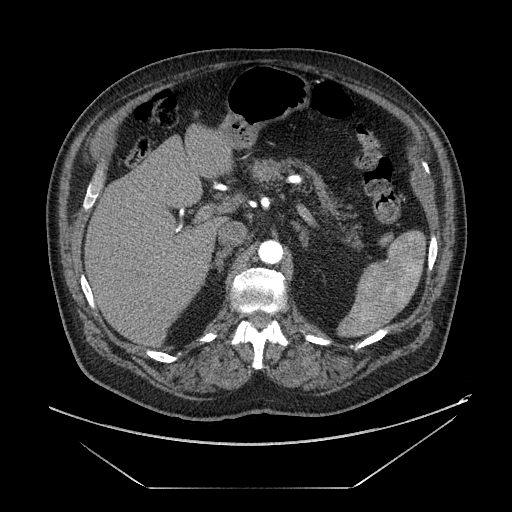
[im 169/281  soft-tissue]
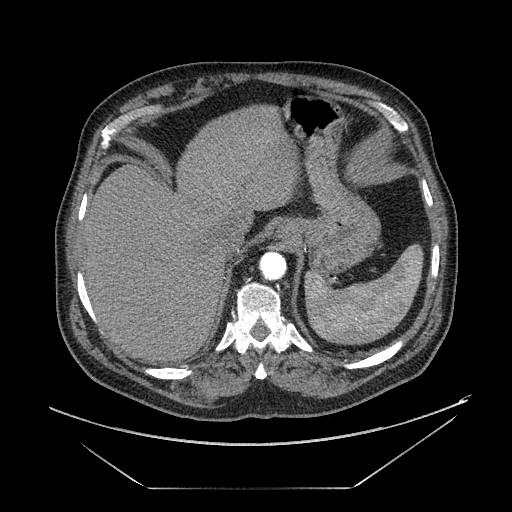
[im 187/281  soft-tissue]
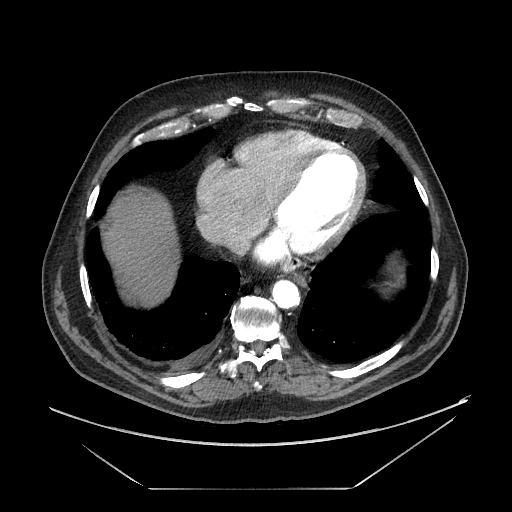
[im 206/281  soft-tissue]
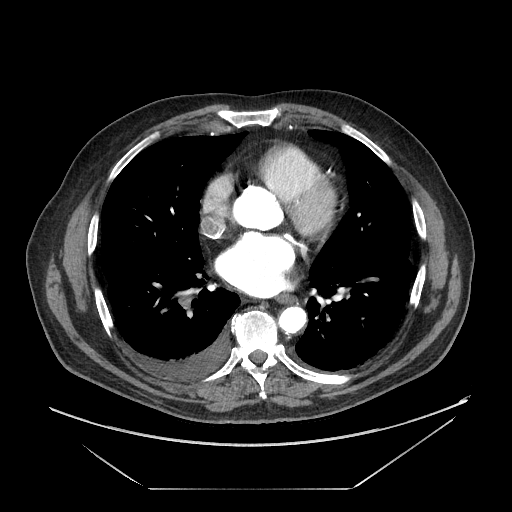
[im 206/281  lung]
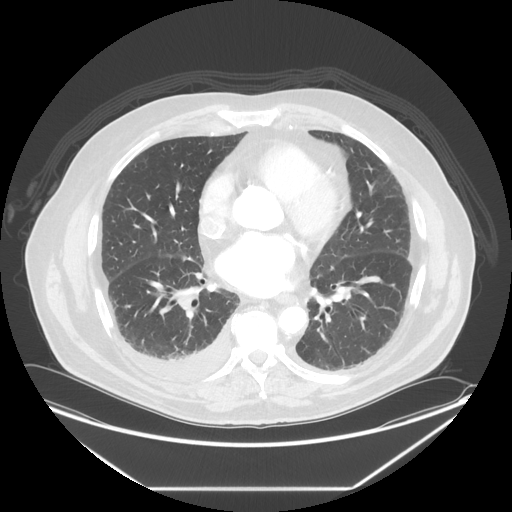
[im 206/281  bone]
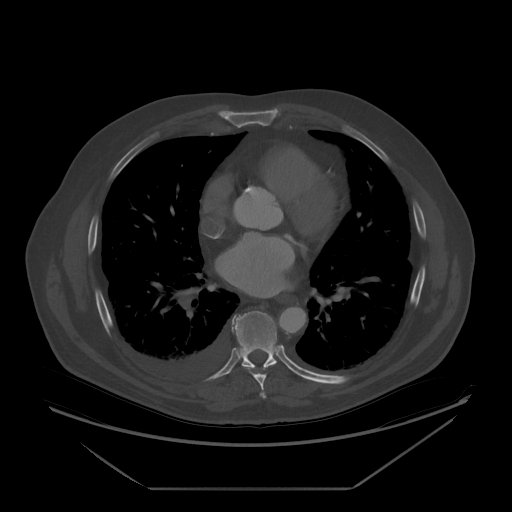
[im 225/281  lung]
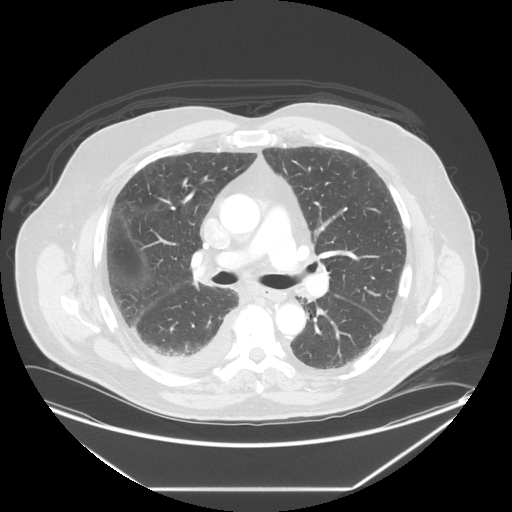
[im 243/281  soft-tissue]
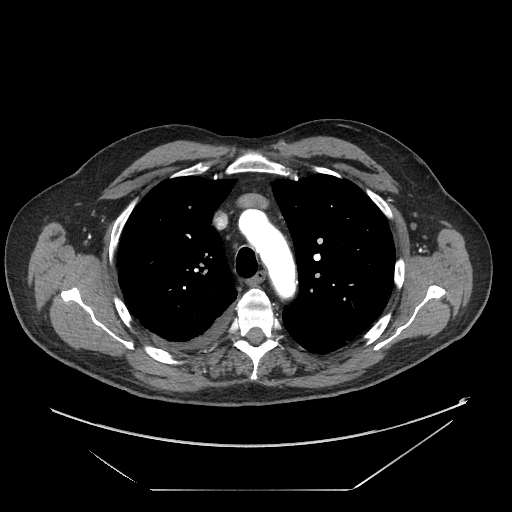
[im 243/281  lung]
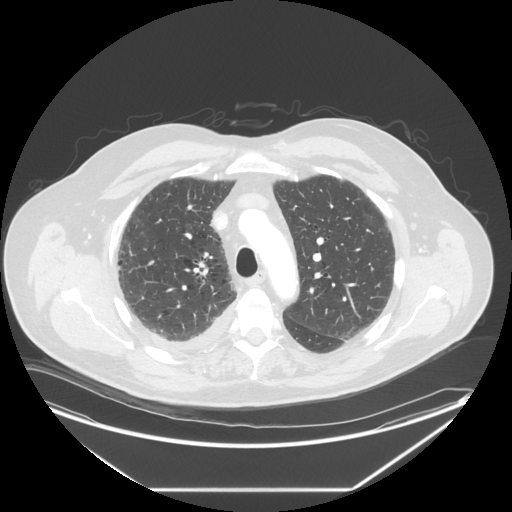
[im 262/281  soft-tissue]
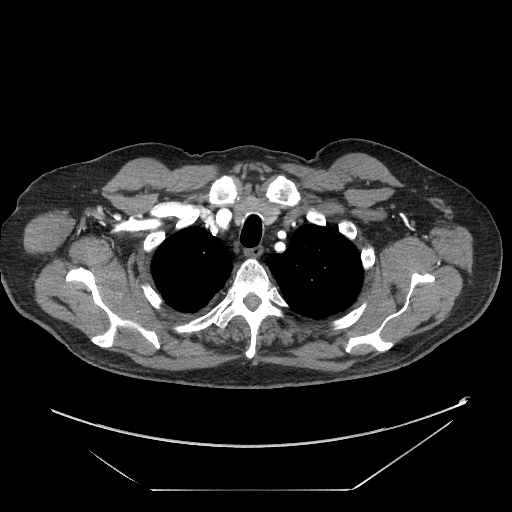
[im 262/281  lung]
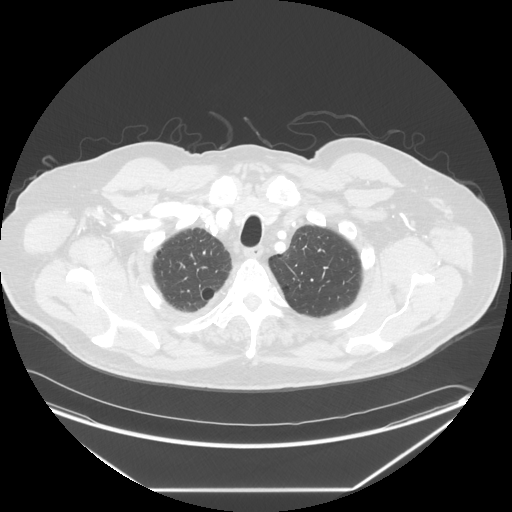

[12 of 46 positions shown; findings below may reference images not displayed]

FINDINGS: CTA CHEST FINDINGS

The lungs are well aerated bilaterally and demonstrate emphysematous
changes in both lungs particularly in the apices. A small
right-sided pleural effusion is noted. No focal infiltrate or
pneumothorax is seen.

The thoracic inlet is within normal limits. The origins of the
brachiocephalic vessels are unremarkable. A truncus anomaly is
noted. Mild aortic calcifications are noted. No evidence of
aneurysmal dilatation is seen. Some irregular aortic plaque is noted
in the descending aorta. A small area of intimal irregularity is
noted in the descending aorta (image 85 of series 4) which may
represent a small penetrating ulcer or focal dissection. This
measures approximately 9 mm in greatest dimension. No significant
hilar or mediastinal adenopathy is noted. The pulmonary artery as
visualized is within normal limits.

The left atrium is well visualized without filling defect. A left
atrial appendage is noted. Normal superior and inferior pulmonary
venous anatomy is noted bilaterally. The left main coronary artery
courses mildly posteriorly prior to its more normal course behind
the main pulmonary outflow trunk. Moderate coronary calcifications
are noted particularly in the left anterior descending coronary
artery. Degenerative changes of the thoracic spine are seen.

Review of the MIP images confirms the above findings.

CTA ABDOMEN AND PELVIS FINDINGS

Vascular: The abdominal aorta demonstrates diffuse mild
calcification without evidence of aneurysmal dilatation. Mild
calcifications are noted in the iliac vessels although no focal
aneurysmal dilatation or focal areas of stenosis are identified. The
inferior mesenteric artery, superior mesenteric artery and celiac
axis are widely patent. Dual renal arteries are noted on the right.
Three renal arteries are noted on the left.

Nonvascular:

The liver, spleen, adrenal glands, gallbladder and pancreas are
within normal limits. A few small accessory splenules are noted. The
kidneys demonstrate a normal enhancement pattern bilaterally. No
renal calculi or urinary tract obstructive changes are seen. Mild
diverticular change of the colon is noted. The appendix is well
visualized and within normal limits. The bladder is incompletely
distended. No prostatic enlargement is seen. No pelvic sidewall mass
or adenopathy is seen.

Review of the MIP images confirms the above findings.
IMPRESSION: Irregular atherosclerotic plaque in the descending thoracic aorta
with an area of irregularity which may represent a small focal
dissection or penetrating ulcer.

No thoracic aortic aneurysm is identified. Normal pulmonary venous
anatomy is noted.

Multiple renal arteries bilaterally. The visceral vessels are
otherwise within normal limits.

No evidence of abdominal aortic aneurysm. Mild atherosclerotic
calcifications are seen although no focal stenoses are noted.

No other focal abnormality is noted.

## 2015-01-16 IMAGING — CR DG CHEST 2V
2 series · 2 of 2 positions shown · non-contrast
Comparison: 10/31/2013; 10/30/2013; 10/27/2013; 06/25/2013; chest
CT - 10/22/2013

CLINICAL DATA: Right-sided chest tube removal

EXAM:
CHEST  2 VIEW

[w chest pa]
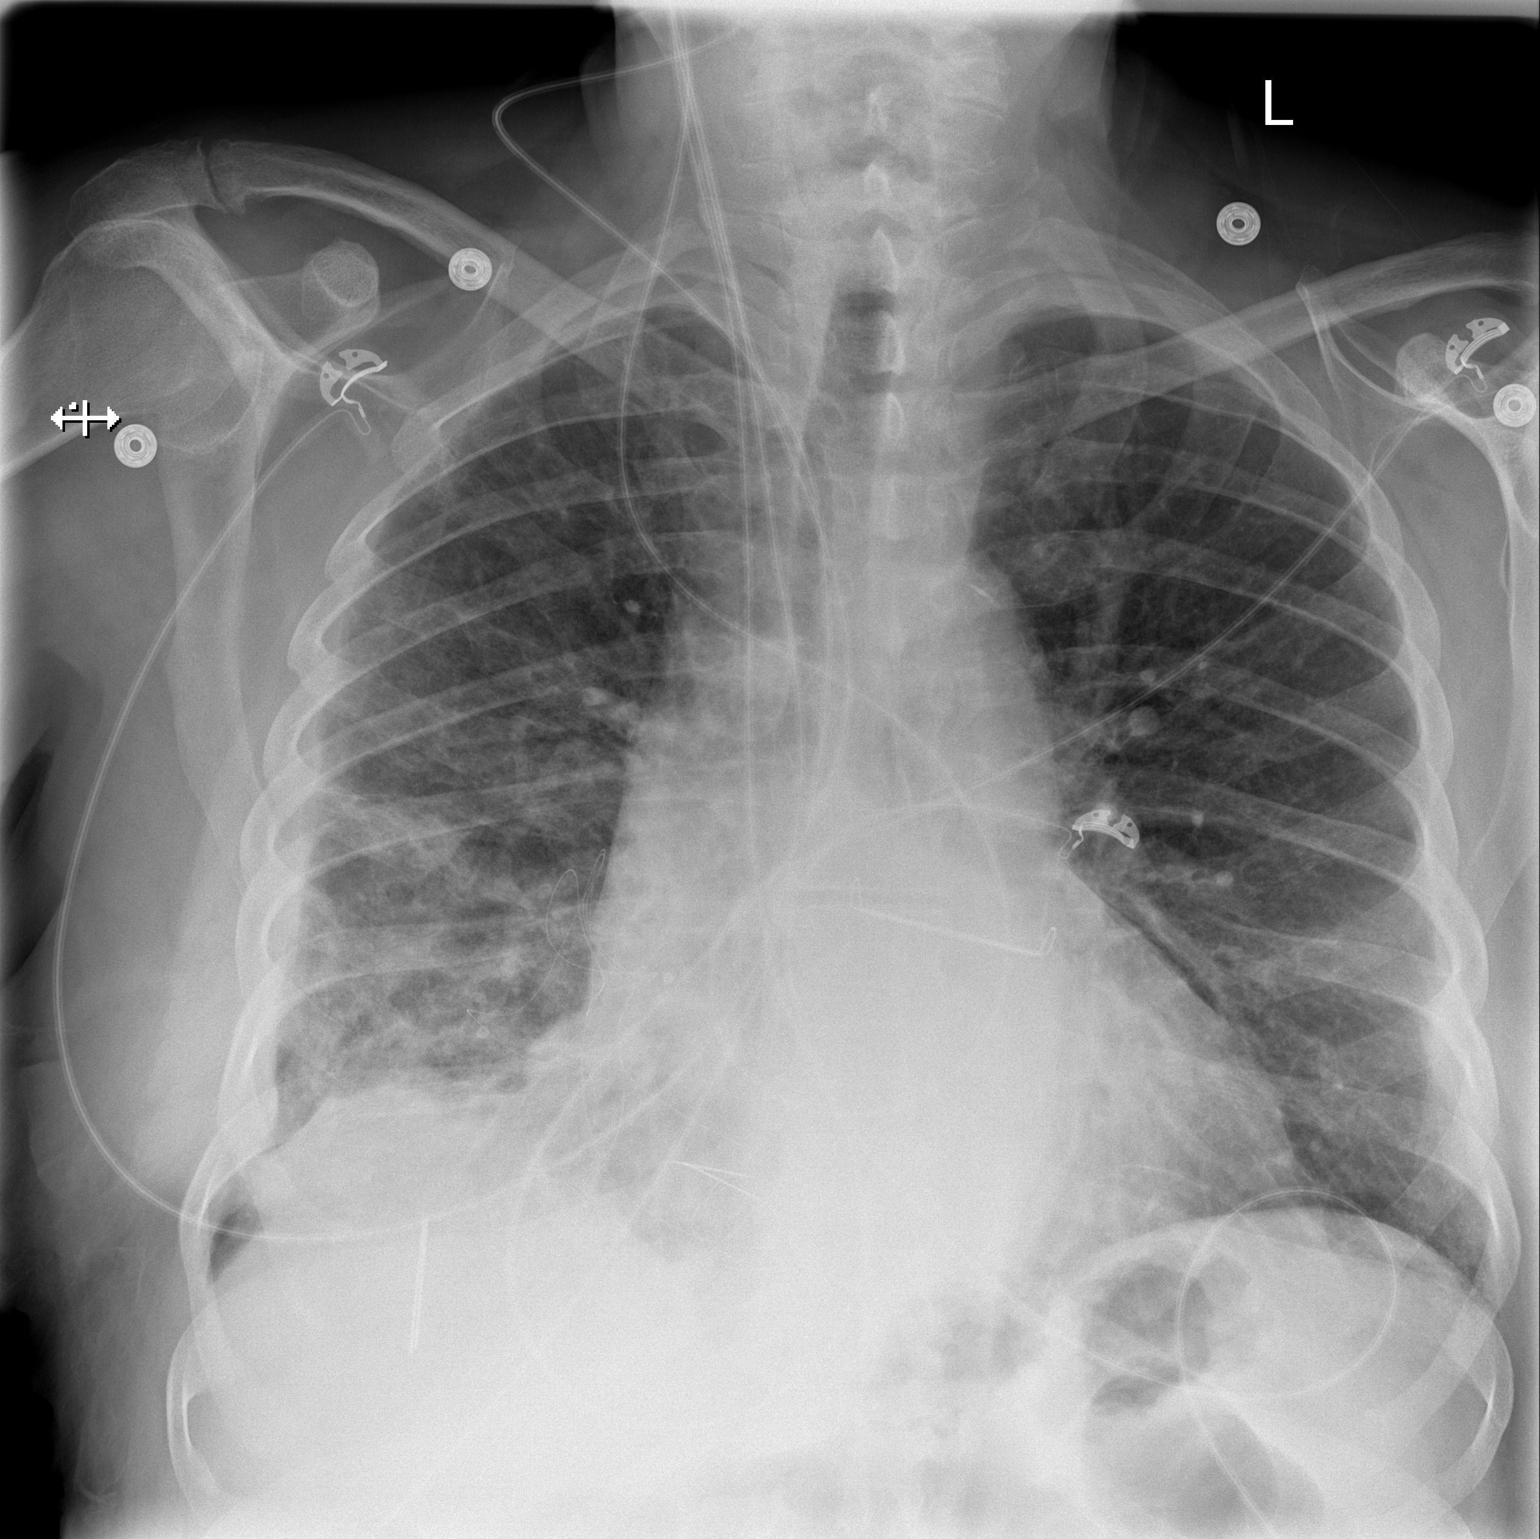

[w chest lat]
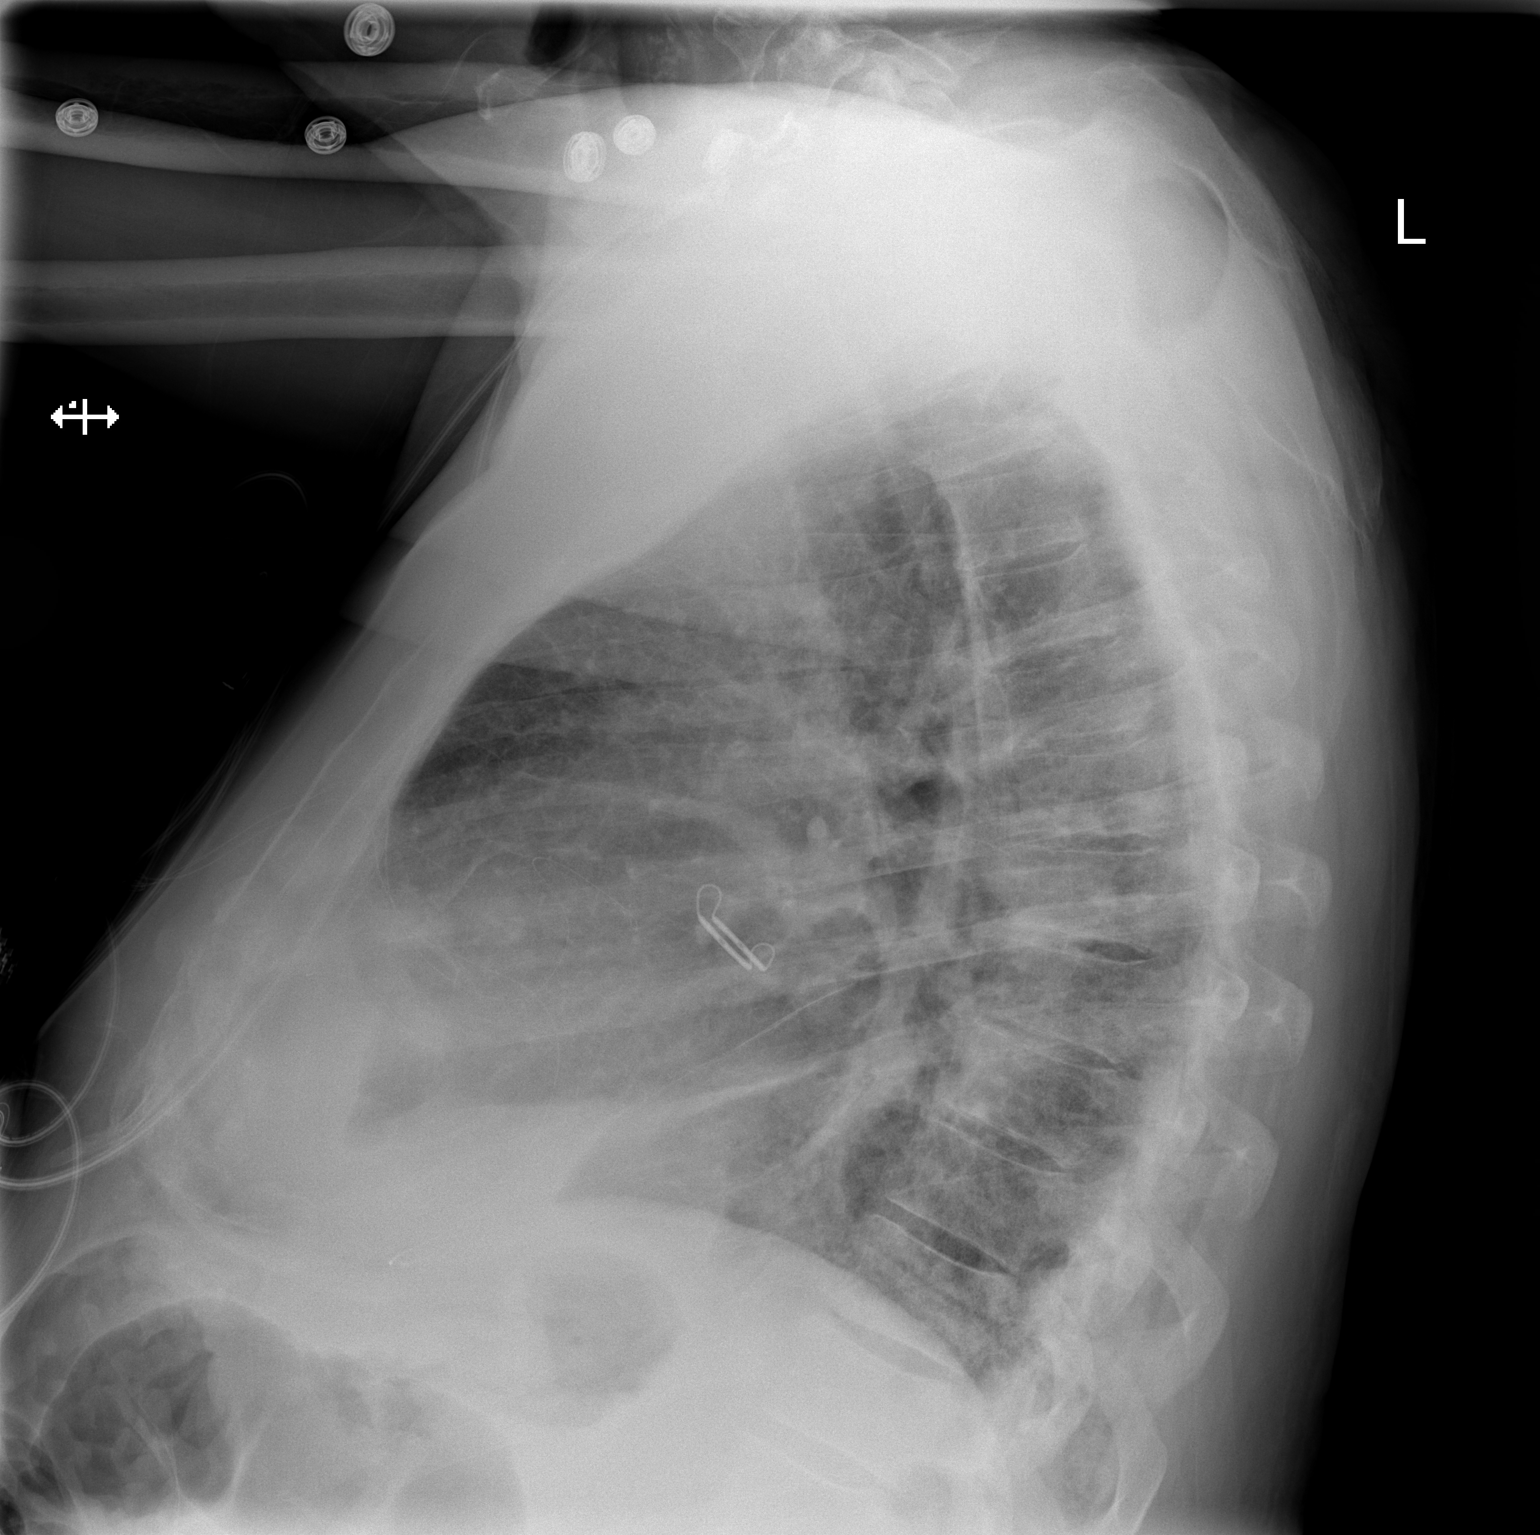

[2 of 2 positions shown; findings below may reference images not displayed]

FINDINGS: Grossly unchanged enlarged cardiac silhouette and mediastinal
contours. Atherosclerotic plaque within the thoracic aorta. Interval
removal left jugular approach vascular sheath and 2 right-sided
chest tubes. No pneumothorax. Overall improved aeration of the lungs
with persistent bilateral mid and lower lung heterogeneous
opacities, right greater than left. Trace bilateral effusions, right
greater than left. Grossly unchanged bones.
IMPRESSION: 1. Interval removal of support apparatus.  No pneumothorax.
2. Improved aeration the lungs suggests resolving edema and
atelectasis.
3. Residual trace bilateral effusions with associated bibasilar
opacities, right greater than left, likely atelectasis and/or
contusion.

## 2015-01-20 ENCOUNTER — Encounter: Payer: Self-pay | Admitting: Podiatry

## 2015-01-20 ENCOUNTER — Ambulatory Visit (INDEPENDENT_AMBULATORY_CARE_PROVIDER_SITE_OTHER): Payer: Medicare Other | Admitting: Podiatry

## 2015-01-20 DIAGNOSIS — M79673 Pain in unspecified foot: Secondary | ICD-10-CM

## 2015-01-20 DIAGNOSIS — B351 Tinea unguium: Secondary | ICD-10-CM

## 2015-01-20 NOTE — Progress Notes (Signed)
Patient ID: Jose Snyder, male   DOB: 1940-11-04, 74 y.o.   MRN: [id number] Complaint:  Visit Type: Patient returns to my office for continued preventative foot care services. Complaint: Patient states" my nails have grown long and thick and become painful to walk and wear shoes. The patient presents for preventative foot care services. No changes to ROS  Podiatric Exam: Vascular: dorsalis pedis and posterior tibial pulses are palpable bilateral. Capillary return is immediate. Temperature gradient is WNL. Skin turgor WNL  Sensorium: Normal Semmes Weinstein monofilament test. Normal tactile sensation bilaterally. Nail Exam: Pt has thick disfigured discolored nails with subungual debris noted bilateral entire nail hallux through fifth toenails Ulcer Exam: There is no evidence of ulcer or pre-ulcerative changes or infection. Orthopedic Exam: Muscle tone and strength are WNL. No limitations in general ROM. No crepitus or effusions noted. Foot type and digits show no abnormalities. Bony prominences are unremarkable. Skin: No Porokeratosis. No infection or ulcers  Diagnosis:  Onychomycosis, , Pain in right toe, pain in left toes  Treatment & Plan Procedures and Treatment: Consent by patient was obtained for treatment procedures. The patient understood the discussion of treatment and procedures well. All questions were answered thoroughly reviewed. Debridement of mycotic and hypertrophic toenails, 1 through 5 bilateral and clearing of subungual debris. No ulceration, no infection noted.  Return Visit-Office Procedure: Patient instructed to return to the office for a follow up visit 3 months for continued evaluation and treatment.  [name] DPM

## 2015-02-25 DIAGNOSIS — C61 Malignant neoplasm of prostate: Secondary | ICD-10-CM | POA: Diagnosis not present

## 2015-04-21 ENCOUNTER — Encounter: Payer: Self-pay | Admitting: Podiatry

## 2015-04-21 ENCOUNTER — Ambulatory Visit (INDEPENDENT_AMBULATORY_CARE_PROVIDER_SITE_OTHER): Payer: Medicare Other | Admitting: Podiatry

## 2015-04-21 DIAGNOSIS — B351 Tinea unguium: Secondary | ICD-10-CM

## 2015-04-21 DIAGNOSIS — M79673 Pain in unspecified foot: Secondary | ICD-10-CM

## 2015-04-21 NOTE — Progress Notes (Signed)
Patient ID: Jose Snyder, male   DOB: March 18, 1940, 76 y.o.   MRN: [id number] Complaint:  Visit Type: Patient returns to my office for continued preventative foot care services. Complaint: Patient states" my nails have grown long and thick and become painful to walk and wear shoes. The patient presents for preventative foot care services. No changes to ROS  Podiatric Exam: Vascular: dorsalis pedis and posterior tibial pulses are palpable bilateral. Capillary return is immediate. Temperature gradient is WNL. Skin turgor WNL  Sensorium: Normal Semmes Weinstein monofilament test. Normal tactile sensation bilaterally. Nail Exam: Pt has thick disfigured discolored nails with subungual debris noted bilateral entire nail hallux through fifth toenails Ulcer Exam: There is no evidence of ulcer or pre-ulcerative changes or infection. Orthopedic Exam: Muscle tone and strength are WNL. No limitations in general ROM. No crepitus or effusions noted. Foot type and digits show no abnormalities. Bony prominences are unremarkable. Skin: No Porokeratosis. No infection or ulcers  Diagnosis:  Onychomycosis, , Pain in right toe, pain in left toes  Treatment & Plan Procedures and Treatment: Consent by patient was obtained for treatment procedures. The patient understood the discussion of treatment and procedures well. All questions were answered thoroughly reviewed. Debridement of mycotic and hypertrophic toenails, 1 through 5 bilateral and clearing of subungual debris. No ulceration, no infection noted.  Return Visit-Office Procedure: Patient instructed to return to the office for a follow up visit 3 months for continued evaluation and treatment.  [name] DPM

## 2015-04-21 NOTE — Addendum Note (Signed)
Addended by: Ezzard Flax, Kateleen Encarnacion L on: 04/21/2015 08:37 AM   Modules accepted: Medications

## 2015-04-26 ENCOUNTER — Other Ambulatory Visit: Payer: Self-pay | Admitting: *Deleted

## 2015-04-26 DIAGNOSIS — I719 Aortic aneurysm of unspecified site, without rupture: Secondary | ICD-10-CM

## 2015-04-26 DIAGNOSIS — C61 Malignant neoplasm of prostate: Secondary | ICD-10-CM | POA: Diagnosis not present

## 2015-04-26 DIAGNOSIS — Z0000 Encounter for general adult medical examination without abnormal findings: Secondary | ICD-10-CM | POA: Diagnosis not present

## 2015-04-26 DIAGNOSIS — I700 Atherosclerosis of aorta: Secondary | ICD-10-CM

## 2015-04-26 DIAGNOSIS — R918 Other nonspecific abnormal finding of lung field: Secondary | ICD-10-CM

## 2015-04-26 DIAGNOSIS — N5201 Erectile dysfunction due to arterial insufficiency: Secondary | ICD-10-CM | POA: Diagnosis not present

## 2015-04-26 DIAGNOSIS — N481 Balanitis: Secondary | ICD-10-CM | POA: Diagnosis not present

## 2015-04-27 NOTE — Progress Notes (Signed)
HPI: FU atrial fibrillation. Echo 4/15 EF 20-25%. LHC demonstrated mod non-obs CAD. Most significant lesion was a mid D1 with 75% stenosis. DCM was felt to be out of proportion to CAD (NICM). Had DCCV on amiodarone but did not hold sinus. He was seen by Dr. Rayann Heman and referred to Dr. Roxy Manns for surgical maze. Carotid Dopplers August 2015 showed 1-39% bilateral stenosis. CTA August 2015 showed possible small focal dissection or penetrating ulcer in the descending thoracic aorta. No evidence of abdominal aortic aneurysm. Patient underwent maze procedure with clipping of left atrial appendage on October 29, 2013. Echocardiogram repeated December 2015 and showed improvement in LV function. Ejection fraction 50-55% and mild left atrial enlargement. Chest CT April 2016 showed unchanged penetrating atherosclerotic ulcer. There were biapical ground glass nodules as well. This is followed by Dr. Roxy Manns. Since last seen, He has some dyspnea on exertion but no orthopnea, PND, pedal edema, palpitations, syncope or chest pain.  Current Outpatient Prescriptions  Medication Sig Dispense Refill  . apixaban (ELIQUIS) 5 MG TABS tablet Take 1 tablet (5 mg total) by mouth 2 (two) times daily. 60 tablet 11  . atorvastatin (LIPITOR) 20 MG tablet Take 20 mg by mouth daily with breakfast.    . betamethasone dipropionate (DIPROLENE) 0.05 % ointment Apply topically 2 (two) times daily.    . Cholecalciferol (VITAMIN D) 2000 UNITS tablet Take 2,000 Units by mouth daily.    Marland Kitchen docusate sodium (COLACE) 100 MG capsule Take 100 mg by mouth daily as needed for mild constipation.    . fluticasone (FLONASE) 50 MCG/ACT nasal spray Place 1 spray into both nostrils daily as needed for allergies or rhinitis.     . furosemide (LASIX) 40 MG tablet Take 1 tablet (40 mg total) by mouth 2 (two) times daily. If swelling takes an extra dose 60 tablet 6  . lisinopril (PRINIVIL,ZESTRIL) 20 MG tablet Take 1 tablet (20 mg total) by mouth daily with  breakfast. 30 tablet 12  . metoprolol succinate (TOPROL-XL) 50 MG 24 hr tablet TAKE ONE AND ONE HALF TABLET ONCE DAILY Take with or immediately following a meal. 45 tablet 12  . omeprazole (PRILOSEC) 20 MG capsule Take 20 mg by mouth daily.    . potassium chloride SA (K-DUR,KLOR-CON) 20 MEQ tablet Take 1 tablet (20 mEq total) by mouth daily. 30 tablet 11  . rOPINIRole (REQUIP) 0.5 MG tablet Take 0.5 mg by mouth 2 (two) times daily.    . temazepam (RESTORIL) 15 MG capsule Take 15 mg by mouth at bedtime as needed.      No current facility-administered medications for this visit.     Past Medical History  Diagnosis Date  . Hypertension   . Obesity   . GERD (gastroesophageal reflux disease)   . Chronic combined systolic and diastolic heart failure ([place]) 06/26/2013    a.  Echo (06/25/13):  EF 20-25%, diff HK, restrictive physio, mild MR, mod to severe LAE, mild RVE, mildly reduced RVSF, mod to severe RAE  . NICM (nonischemic cardiomyopathy) with EF 20-25% 06/26/2013  . Atrial Fibrillation  06/25/2013  . Dyslipidemia 07/15/2013  . CAD (coronary artery disease), native coronary artery 06/28/2013    a. LHC (06/27/13):  Mid LAD 40%, mid D1 75%, mid CFX 50-60%, mid RCA 50%, EF 20%, LVEDP 24 mmHg.  Marland Kitchen Penetrating atherosclerotic ulcer of aorta ([place]) 10/22/2013    Small penetrating ulcer of descending thoracic aorta discovered on routine CTA  . Shortness of breath  seen by [facility], & low energy   . CHF (congestive heart failure) ([place])   . Dysrhythmia     a-fib  . Anxiety   . Arthritis     R hand- OA  . Prostate cancer ([place])     watchful waiting, no treatment so far  . S/P Maze operation for atrial fibrillation 10/29/2013    Complete bilateral atrial lesion set using cryothermy and bipolar radiofrequency ablation with clipping of LA appendage via right mini thoracotomy  . Chronic kidney disease   . Incidental pulmonary nodule 06/22/2014    Seen on CT angiogram of chest    Past Surgical History    Procedure Laterality Date  . Medial partial knee replacement Left 1990's?  . Transurethral resection of prostate  2010  . Tee without cardioversion N/A 06/30/2013    Procedure: TRANSESOPHAGEAL ECHOCARDIOGRAM (TEE) ;  Surgeon: [name], MD;  Location: [facility];  Service: Cardiovascular;  Laterality: N/A;  . Cardioversion N/A 06/30/2013    Procedure: CARDIOVERSION;  Surgeon: [name], MD;  Location: [facility];  Service: Cardiovascular;  Laterality: N/A;  . Cardioversion N/A 07/31/2013    Procedure: CARDIOVERSION;  Surgeon: Thayer Headings, MD;  Location: [facility];  Service: Cardiovascular;  Laterality: N/A;  . Cardiac catheterization  06/2013  . Minimally invasive maze procedure N/A 10/29/2013    Procedure: MINIMALLY INVASIVE MAZE PROCEDURE;  Surgeon: Rexene Alberts, MD;  Location: [facility];  Service: Open Heart Surgery;  Laterality: N/A;  . Intraoperative transesophageal echocardiogram N/A 10/29/2013    Procedure: INTRAOPERATIVE TRANSESOPHAGEAL ECHOCARDIOGRAM;  Surgeon: Rexene Alberts, MD;  Location: [facility];  Service: Open Heart Surgery;  Laterality: N/A;  . Clipping of atrial appendage  10/29/2013    Procedure: CLIPPING OF ATRIAL APPENDAGE;  Surgeon: Rexene Alberts, MD;  Location: [facility];  Service: Open Heart Surgery;;  . Left and right heart catheterization with coronary angiogram N/A 06/27/2013    Procedure: LEFT AND RIGHT HEART CATHETERIZATION WITH CORONARY ANGIOGRAM;  Surgeon: Jettie Booze, MD;  Location: [facility] CATH LAB;  Service: Cardiovascular;  Laterality: N/A;    Social History   Social History  . Marital Status: Widowed    Spouse Name: N/A  . Number of Children: N/A  . Years of Education: N/A   Occupational History  . Not on file.   Social History Main Topics  . Smoking status: Never Smoker   . Smokeless tobacco: Never Used     Comment: 06/25/2013 "smoked very light; years and years ago"  . Alcohol Use: 8.4 oz/week    7 Glasses of wine, 7 Shots of liquor per  week  . Drug Use: No  . Sexual Activity: Not Currently   Other Topics Concern  . Not on file   Social History Narrative   He lives in [place]. He is widowed. Wife passed of breast cancer last year. No children.     Family History  Problem Relation Age of Onset  . Healthy Father   . Healthy Mother   . Healthy Sister     ROS: no fevers or chills, productive cough, hemoptysis, dysphasia, odynophagia, melena, hematochezia, dysuria, hematuria, rash, seizure activity, orthopnea, PND, pedal edema, claudication. Remaining systems are negative.  Physical Exam: Well-developed obese in no acute distress.  Skin is warm and dry.  HEENT is normal.  Neck is supple.  Chest is clear to auscultation with normal expansion.  Cardiovascular exam is regular rate and rhythm.  Abdominal exam nontender or distended.  No masses palpated. Extremities show no edema. neuro grossly intact  ECG Sinus rhythm at a rate of 72. No ST changes.

## 2015-04-28 ENCOUNTER — Encounter: Payer: Self-pay | Admitting: Cardiology

## 2015-04-28 ENCOUNTER — Ambulatory Visit (INDEPENDENT_AMBULATORY_CARE_PROVIDER_SITE_OTHER): Payer: Medicare Other | Admitting: Cardiology

## 2015-04-28 VITALS — BP 160/98 | HR 82 | Ht 74.0 in | Wt 270.1 lb

## 2015-04-28 DIAGNOSIS — E785 Hyperlipidemia, unspecified: Secondary | ICD-10-CM

## 2015-04-28 DIAGNOSIS — I429 Cardiomyopathy, unspecified: Secondary | ICD-10-CM | POA: Diagnosis not present

## 2015-04-28 DIAGNOSIS — I4891 Unspecified atrial fibrillation: Secondary | ICD-10-CM | POA: Diagnosis not present

## 2015-04-28 DIAGNOSIS — I482 Chronic atrial fibrillation, unspecified: Secondary | ICD-10-CM

## 2015-04-28 MED ORDER — METOPROLOL SUCCINATE ER 100 MG PO TB24: 100.0000 mg | ORAL_TABLET | Freq: Every day | ORAL | Status: DC

## 2015-04-28 MED ORDER — ATORVASTATIN CALCIUM 80 MG PO TABS: 80.0000 mg | ORAL_TABLET | Freq: Every day | ORAL | Status: AC

## 2015-04-28 NOTE — Assessment & Plan Note (Signed)
Given documented coronary artery disease I will increase Lipitor to 80 mg daily. Check lipids and liver in 4 weeks.

## 2015-04-28 NOTE — Assessment & Plan Note (Signed)
Patient remains in sinus rhythm. Continue beta blocker and apixaban. Check hemoglobin and renal function.

## 2015-04-28 NOTE — Assessment & Plan Note (Signed)
Followed by Dr. Owen. 

## 2015-04-28 NOTE — Patient Instructions (Signed)
Medication Instructions:   INCREASE METOPROLOL TOP 100 MG ONCE DAILY= 2 OF THE 50 MG TABLETS ONCE DAILY  INCREASE ATORVASTATIN TO 80 MG ONCE DAILY= 4 OF THE 20 MG TABLETS ONCE DAILY  Labwork:  Your physician recommends that you return for lab work in: 4 WEEKS= DO NOT EAT PRIOR TO LAB WORK  Follow-Up:  Your physician wants you to follow-up in: [place] will receive a reminder letter in the mail two months in advance. If you don't receive a letter, please call our office to schedule the follow-up appointment.   If you need a refill on your cardiac medications before your next appointment, please call your pharmacy.

## 2015-04-28 NOTE — Assessment & Plan Note (Signed)
Blood pressure elevated. Increase Toprol to 100 mg daily and follow.

## 2015-04-28 NOTE — Assessment & Plan Note (Signed)
Continue statin.No aspirin given need for anticoagulation. 

## 2015-04-28 NOTE — Assessment & Plan Note (Signed)
Patient appears euvolemic on examination. Continue present dose of Lasix. Check potassium and renal function.

## 2015-04-28 NOTE — Assessment & Plan Note (Signed)
Improved on most recent echo. Continue ACE inhibitor and beta blocker.

## 2015-05-13 ENCOUNTER — Telehealth (HOSPITAL_COMMUNITY): Payer: Self-pay | Admitting: *Deleted

## 2015-05-20 DIAGNOSIS — E785 Hyperlipidemia, unspecified: Secondary | ICD-10-CM | POA: Diagnosis not present

## 2015-05-20 DIAGNOSIS — I482 Chronic atrial fibrillation: Secondary | ICD-10-CM | POA: Diagnosis not present

## 2015-05-20 DIAGNOSIS — R918 Other nonspecific abnormal finding of lung field: Secondary | ICD-10-CM | POA: Diagnosis not present

## 2015-05-20 DIAGNOSIS — I4891 Unspecified atrial fibrillation: Secondary | ICD-10-CM | POA: Diagnosis not present

## 2015-05-20 DIAGNOSIS — I700 Atherosclerosis of aorta: Secondary | ICD-10-CM | POA: Diagnosis not present

## 2015-05-20 LAB — CBC
HCT: 39.5 % (ref 39.0–52.0)
Hemoglobin: 13.9 g/dL (ref 13.0–17.0)
MCH: 32 pg (ref 26.0–34.0)
MCHC: 35.2 g/dL (ref 30.0–36.0)
MCV: 91 fL (ref 78.0–100.0)
MPV: 11.6 fL (ref 8.6–12.4)
Platelets: 149 10*3/uL — ABNORMAL LOW (ref 150–400)
RBC: 4.34 MIL/uL (ref 4.22–5.81)
RDW: 13.9 % (ref 11.5–15.5)
WBC: 5.2 10*3/uL (ref 4.0–10.5)

## 2015-05-20 LAB — HEPATIC FUNCTION PANEL
ALT: 16 U/L (ref 9–46)
AST: 18 U/L (ref 10–35)
Albumin: 4.2 g/dL (ref 3.6–5.1)
Alkaline Phosphatase: 76 U/L (ref 40–115)
BILIRUBIN INDIRECT: 0.7 mg/dL (ref 0.2–1.2)
Bilirubin, Direct: 0.2 mg/dL (ref <=0.2)
TOTAL PROTEIN: 7.1 g/dL (ref 6.1–8.1)
Total Bilirubin: 0.9 mg/dL (ref 0.2–1.2)

## 2015-05-20 LAB — BASIC METABOLIC PANEL
BUN: 18 mg/dL (ref 7–25)
CALCIUM: 8.8 mg/dL (ref 8.6–10.3)
CHLORIDE: 100 mmol/L (ref 98–110)
CO2: 25 mmol/L (ref 20–31)
CREATININE: 1.5 mg/dL — AB (ref 0.70–1.18)
Glucose, Bld: 111 mg/dL — ABNORMAL HIGH (ref 65–99)
Potassium: 4.6 mmol/L (ref 3.5–5.3)
Sodium: 137 mmol/L (ref 135–146)

## 2015-05-20 LAB — LIPID PANEL
CHOLESTEROL: 117 mg/dL — AB (ref 125–200)
HDL: 43 mg/dL (ref >=40)
LDL Cholesterol: 32 mg/dL (ref <130)
TRIGLYCERIDES: 211 mg/dL — AB (ref <150)
Total CHOL/HDL Ratio: 2.7 Ratio (ref <=5.0)
VLDL: 42 mg/dL — ABNORMAL HIGH (ref <30)

## 2015-05-20 LAB — CREATININE, ISTAT: Creatinine, IStat: 1.5 mg/dL — ABNORMAL HIGH (ref 0.6–1.3)

## 2015-05-24 ENCOUNTER — Ambulatory Visit: Payer: Medicare Other | Admitting: Thoracic Surgery (Cardiothoracic Vascular Surgery)

## 2015-05-24 ENCOUNTER — Other Ambulatory Visit: Payer: Self-pay | Admitting: *Deleted

## 2015-05-24 ENCOUNTER — Inpatient Hospital Stay: Admission: RE | Admit: 2015-05-24 | Payer: Medicare Other | Source: Ambulatory Visit

## 2015-05-24 DIAGNOSIS — N289 Disorder of kidney and ureter, unspecified: Secondary | ICD-10-CM

## 2015-06-03 ENCOUNTER — Encounter (HOSPITAL_COMMUNITY): Payer: Self-pay

## 2015-06-03 ENCOUNTER — Encounter (HOSPITAL_COMMUNITY)
Admission: RE | Admit: 2015-06-03 | Discharge: 2015-06-03 | Disposition: A | Discharge status: Home / Self Care | Payer: Medicare Other | Source: Ambulatory Visit | Attending: Cardiology | Admitting: Cardiology

## 2015-06-03 VITALS — BP 136/74 | HR 84 | Ht 73.0 in | Wt 268.5 lb

## 2015-06-03 DIAGNOSIS — K219 Gastro-esophageal reflux disease without esophagitis: Secondary | ICD-10-CM | POA: Insufficient documentation

## 2015-06-03 DIAGNOSIS — I2510 Atherosclerotic heart disease of native coronary artery without angina pectoris: Secondary | ICD-10-CM | POA: Insufficient documentation

## 2015-06-03 DIAGNOSIS — E669 Obesity, unspecified: Secondary | ICD-10-CM | POA: Diagnosis not present

## 2015-06-03 DIAGNOSIS — Z6835 Body mass index (BMI) 35.0-35.9, adult: Secondary | ICD-10-CM | POA: Insufficient documentation

## 2015-06-03 DIAGNOSIS — Z7901 Long term (current) use of anticoagulants: Secondary | ICD-10-CM | POA: Diagnosis not present

## 2015-06-03 DIAGNOSIS — I428 Other cardiomyopathies: Secondary | ICD-10-CM | POA: Diagnosis not present

## 2015-06-03 DIAGNOSIS — I130 Hypertensive heart and chronic kidney disease with heart failure and stage 1 through stage 4 chronic kidney disease, or unspecified chronic kidney disease: Secondary | ICD-10-CM | POA: Diagnosis not present

## 2015-06-03 DIAGNOSIS — Z79899 Other long term (current) drug therapy: Secondary | ICD-10-CM | POA: Insufficient documentation

## 2015-06-03 DIAGNOSIS — I4891 Unspecified atrial fibrillation: Secondary | ICD-10-CM | POA: Insufficient documentation

## 2015-06-03 DIAGNOSIS — N189 Chronic kidney disease, unspecified: Secondary | ICD-10-CM | POA: Insufficient documentation

## 2015-06-03 DIAGNOSIS — E785 Hyperlipidemia, unspecified: Secondary | ICD-10-CM | POA: Insufficient documentation

## 2015-06-03 DIAGNOSIS — C61 Malignant neoplasm of prostate: Secondary | ICD-10-CM | POA: Insufficient documentation

## 2015-06-03 DIAGNOSIS — I5042 Chronic combined systolic (congestive) and diastolic (congestive) heart failure: Secondary | ICD-10-CM | POA: Insufficient documentation

## 2015-06-03 NOTE — Progress Notes (Signed)
Cardiac Individual Treatment Plan  Patient Details  Name: Jose Snyder MRN: [id number] Date of Birth: 1940-06-30 Referring Provider:  Lelon Perla, MD  Initial Encounter Date:       CARDIAC REHAB PHASE II ORIENTATION from 06/03/2015 in [facility]   Date  06/03/15      Visit Diagnosis: Chronic combined systolic and diastolic congestive heart failure ([place])  Patient's Home Medications on Admission:  Current outpatient prescriptions:  .  apixaban (ELIQUIS) 5 MG TABS tablet, Take 1 tablet (5 mg total) by mouth 2 (two) times daily., Disp: 60 tablet, Rfl: 11 .  atorvastatin (LIPITOR) 80 MG tablet, Take 1 tablet (80 mg total) by mouth daily with breakfast., Disp: 30 tablet, Rfl: 12 .  betamethasone dipropionate (DIPROLENE) 0.05 % ointment, Apply topically 2 (two) times daily., Disp: , Rfl:  .  Cholecalciferol (VITAMIN D) 2000 UNITS tablet, Take 2,000 Units by mouth daily., Disp: , Rfl:  .  docusate sodium (COLACE) 100 MG capsule, Take 100 mg by mouth daily as needed for mild constipation., Disp: , Rfl:  .  fluticasone (FLONASE) 50 MCG/ACT nasal spray, Place 1 spray into both nostrils daily as needed for allergies or rhinitis. , Disp: , Rfl:  .  furosemide (LASIX) 40 MG tablet, Take 1 tablet (40 mg total) by mouth 2 (two) times daily. If swelling takes an extra dose, Disp: 60 tablet, Rfl: 6 .  ibuprofen (ADVIL,MOTRIN) 200 MG tablet, Take 400 mg by mouth daily., Disp: , Rfl:  .  lisinopril (PRINIVIL,ZESTRIL) 20 MG tablet, Take 1 tablet (20 mg total) by mouth daily with breakfast., Disp: 30 tablet, Rfl: 12 .  metoprolol succinate (TOPROL-XL) 100 MG 24 hr tablet, Take 1 tablet (100 mg total) by mouth daily., Disp: 30 tablet, Rfl: 12 .  omeprazole (PRILOSEC) 20 MG capsule, Take 20 mg by mouth daily., Disp: , Rfl:  .  potassium chloride SA (K-DUR,KLOR-CON) 20 MEQ tablet, Take 1 tablet (20 mEq total) by mouth daily., Disp: 30 tablet, Rfl: 11 .  temazepam (RESTORIL) 15  MG capsule, Take 15 mg by mouth at bedtime as needed. , Disp: , Rfl:   Past Medical History: Past Medical History  Diagnosis Date  . Hypertension   . Obesity   . GERD (gastroesophageal reflux disease)   . Chronic combined systolic and diastolic heart failure ([place]) 06/26/2013    a.  Echo (06/25/13):  EF 20-25%, diff HK, restrictive physio, mild MR, mod to severe LAE, mild RVE, mildly reduced RVSF, mod to severe RAE  . NICM (nonischemic cardiomyopathy) with EF 20-25% 06/26/2013  . Atrial Fibrillation  06/25/2013  . Dyslipidemia 07/15/2013  . CAD (coronary artery disease), native coronary artery 06/28/2013    a. LHC (06/27/13):  Mid LAD 40%, mid D1 75%, mid CFX 50-60%, mid RCA 50%, EF 20%, LVEDP 24 mmHg.  Marland Kitchen Penetrating atherosclerotic ulcer of aorta ([place]) 10/22/2013    Small penetrating ulcer of descending thoracic aorta discovered on routine CTA  . Shortness of breath     seen by C[facility], & low energy   . CHF (congestive heart failure) ([place])   . Dysrhythmia     a-fib  . Anxiety   . Arthritis     R hand- OA  . Prostate cancer ([place])     watchful waiting, no treatment so far  . S/P Maze operation for atrial fibrillation 10/29/2013    Complete bilateral atrial lesion set using cryothermy and bipolar radiofrequency ablation with clipping of LA appendage via  right mini thoracotomy  . Chronic kidney disease   . Incidental pulmonary nodule 06/22/2014    Seen on CT angiogram of chest    Tobacco Use: History  Smoking status  . Never Smoker   Smokeless tobacco  . Never Used    Comment: 06/25/2013 "smoked very light; years and years ago"    Labs:     Recent Review Flowsheet Data    Labs for ITP Cardiac and Pulmonary Rehab Latest Ref Rng 10/30/2013 10/30/2013 10/30/2013 10/30/2013 05/20/2015   Cholestrol 125 - 200 mg/dL - - - - 117(L)   LDLCALC <130 mg/dL - - - - 32   HDL >=40 mg/dL - - - - 43   Trlycerides <150 mg/dL - - - - 211(H)   PHART 7.350 - 7.450 7.406 7.425 - 7.448 -   PCO2ART 35.0 - 45.0  mmHg 32.8(L) 30.7(L) - 29.4(L) -   HCO3 20.0 - 24.0 mEq/L 20.8 20.2 - 20.5 -   TCO2 0 - 100 mmol/L 22 21 19 21  -   ACIDBASEDEF 0.0 - 2.0 mmol/L 3.0(H) 3.0(H) - 3.0(H) -   O2SAT - 90.0 88.0 - 95.0 -      Capillary Blood Glucose: Lab Results  Component Value Date   GLUCAP 139* 10/31/2013   GLUCAP 108* 10/31/2013   GLUCAP 110* 10/30/2013   GLUCAP 136* 10/30/2013   GLUCAP 155* 10/30/2013     Exercise Target Goals: Date: 06/03/15  Exercise Program Goal: Individual exercise prescription set with THRR, safety & activity barriers. Participant demonstrates ability to understand and report RPE using BORG scale, to self-measure pulse accurately, and to acknowledge the importance of the exercise prescription.  Exercise Prescription Goal: Starting with aerobic activity 30 plus minutes a day, 3 days per week for initial exercise prescription. Provide home exercise prescription and guidelines that participant acknowledges understanding prior to discharge.  Activity Barriers & Risk Stratification:     Activity Barriers & Cardiac Risk Stratification - 06/03/15 0834    Activity Barriers & Cardiac Risk Stratification   Activity Barriers Deconditioning;Muscular Weakness;Balance Concerns;Left Knee Replacement;Arthritis;Shortness of Breath   Cardiac Risk Stratification High      6 Minute Walk:     6 Minute Walk      06/03/15 0800       6 Minute Walk   Phase Initial     Distance 688 feet     Walk Time 5.23 minutes     # of Rest Breaks 1     MPH 1.49     METS 1.36     RPE 13     Perceived Dyspnea  2  95%     VO2 Peak 4.79     Symptoms Yes (comment)     Comments Shortness of breath     Resting HR 84 bpm     Resting BP 136/74 mmHg     Max Ex. HR 113 bpm     Max Ex. BP 140/80 mmHg     2 Minute Post BP 122/62 mmHg        Initial Exercise Prescription:     Initial Exercise Prescription - 06/03/15 1100    Date of Initial Exercise Prescription   Date 06/03/15   Recumbant Bike    Level 1   RPM 30   Watts 5   Minutes 10   METs 1.4   T5 Nustep   Level 1   Minutes 10   METs 1.5   Track   Laps 5   Minutes 10  METs 0.84   Prescription Details   Frequency (times per week) 3   Duration Progress to 30 minutes of continuous aerobic without signs/symptoms of physical distress   Intensity   THRR 40-80% of Max Heartrate 58-117   Ratings of Perceived Exertion 11-13   Perceived Dyspnea 0-4   Progression   Progression Continue to progress workloads to maintain intensity without signs/symptoms of physical distress.   Resistance Training   Training Prescription Yes   Weight 2 lbs   Reps 10-12      Perform Capillary Blood Glucose checks as needed.  Exercise Prescription Changes:   Exercise Comments:   Discharge Exercise Prescription (Final Exercise Prescription Changes):   Nutrition:  Target Goals: Understanding of nutrition guidelines, daily intake of sodium 1500mg , cholesterol 200mg , calories 30% from fat and 7% or less from saturated fats, daily to have 5 or more servings of fruits and vegetables.  Biometrics:     Pre Biometrics - 06/03/15 1213    Pre Biometrics   Height 6\' 1"  (1.854 m)   Weight 268 lb 8.3 oz (121.8 kg)   Waist Circumference 46 inches   Hip Circumference 46 inches   Waist to Hip Ratio 1 %   BMI (Calculated) 35.5   Triceps Skinfold 25 mm   % Body Fat 34.9 %   Grip Strength 30 kg   Flexibility 0 in   Single Leg Stand 2.21 seconds       Nutrition Therapy Plan and Nutrition Goals:   Nutrition Discharge: Nutrition Scores:   Nutrition Goals Re-Evaluation:   Psychosocial: Target Goals: Acknowledge presence or absence of depression, maximize coping skills, provide positive support system. Participant is able to verbalize types and ability to use techniques and skills needed for reducing stress and depression.  Initial Review & Psychosocial Screening:     Initial Psych Review & Screening - 06/03/15 1418    Family  Dynamics   Good Support System? Yes   Concerns Recent loss of significant other   Comments mr Broadhead's wife passed away in 28-Jun-2014   Barriers   Psychosocial barriers to participate in program There are no identifiable barriers or psychosocial needs.   Screening Interventions   Interventions Encouraged to exercise      Quality of Life Scores:     Quality of Life - 06/03/15 1214    Quality of Life Scores   Health/Function Pre 25.43 %   Socioeconomic Pre 28.21 %   Psych/Spiritual Pre 28.29 %   Family Pre 27 %   GLOBAL Pre 26.85 %      PHQ-9:     Recent Review Flowsheet Data    There is no flowsheet data to display.      Psychosocial Evaluation and Intervention:   Psychosocial Re-Evaluation:   Vocational Rehabilitation: Provide vocational rehab assistance to qualifying candidates.   Vocational Rehab Evaluation & Intervention:     Vocational Rehab - 06/03/15 1347    Initial Vocational Rehab Evaluation & Intervention   Assessment shows need for Vocational Rehabilitation No  Mr Strebeck said he interested in Vocational Rehab. Mr Broady does not meet Vocational rehab  qualifications due to his age.      Education: Education Goals: Education classes will be provided on a weekly basis, covering required topics. Participant will state understanding/return demonstration of topics presented.  Learning Barriers/Preferences:     Learning Barriers/Preferences - 06/03/15 1212    Learning Barriers/Preferences   Learning Barriers Sight;Hearing  reading glasses, bilateral hearing loss, no hearing aids  Learning Preferences Individual Instruction;Verbal Instruction;Written Material      Education Topics: Count Your Pulse:  -Group instruction provided by verbal instruction, demonstration, patient participation and written materials to support subject.  Instructors address importance of being able to find your pulse and how to count your pulse when at home without a heart monitor.   Patients get hands on experience counting their pulse with staff help and individually.   Heart Attack, Angina, and Risk Factor Modification:  -Group instruction provided by verbal instruction, video, and written materials to support subject.  Instructors address signs and symptoms of angina and heart attacks.    Also discuss risk factors for heart disease and how to make changes to improve heart health risk factors.   Functional Fitness:  -Group instruction provided by verbal instruction, demonstration, patient participation, and written materials to support subject.  Instructors address safety measures for doing things around the house.  Discuss how to get up and down off the floor, how to pick things up properly, how to safely get out of a chair without assistance, and balance training.   Meditation and Mindfulness:  -Group instruction provided by verbal instruction, patient participation, and written materials to support subject.  Instructor addresses importance of mindfulness and meditation practice to help reduce stress and improve awareness.  Instructor also leads participants through a meditation exercise.    Stretching for Flexibility and Mobility:  -Group instruction provided by verbal instruction, patient participation, and written materials to support subject.  Instructors lead participants through series of stretches that are designed to increase flexibility thus improving mobility.  These stretches are additional exercise for major muscle groups that are typically performed during regular warm up and cool down.   Hands Only CPR Anytime:  -Group instruction provided by verbal instruction, video, patient participation and written materials to support subject.  Instructors co-teach with AHA video for hands only CPR.  Participants get hands on experience with mannequins.   Nutrition I class: Heart Healthy Eating:  -Group instruction provided by PowerPoint slides, verbal discussion,  and written materials to support subject matter. The instructor gives an explanation and review of the Therapeutic Lifestyle Changes diet recommendations, which includes a discussion on lipid goals, dietary fat, sodium, fiber, plant stanol/sterol esters, sugar, and the components of a well-balanced, healthy diet.   Nutrition II class: Lifestyle Skills:  -Group instruction provided by PowerPoint slides, verbal discussion, and written materials to support subject matter. The instructor gives an explanation and review of label reading, grocery shopping for heart health, heart healthy recipe modifications, and ways to make healthier choices when eating out.   Diabetes Question & Answer:  -Group instruction provided by PowerPoint slides, verbal discussion, and written materials to support subject matter. The instructor gives an explanation and review of diabetes co-morbidities, pre- and post-prandial blood glucose goals, pre-exercise blood glucose goals, signs, symptoms, and treatment of hypoglycemia and hyperglycemia, and foot care basics.   Diabetes Blitz:  -Group instruction provided by PowerPoint slides, verbal discussion, and written materials to support subject matter. The instructor gives an explanation and review of the physiology behind type 1 and type 2 diabetes, diabetes medications and rational behind using different medications, pre- and post-prandial blood glucose recommendations and Hemoglobin A1c goals, diabetes diet, and exercise including blood glucose guidelines for exercising safely.    Portion Distortion:  -Group instruction provided by PowerPoint slides, verbal discussion, written materials, and food models to support subject matter. The instructor gives an explanation of serving size versus portion size,  changes in portions sizes over the last 20 years, and what consists of a serving from each food group.   Stress Management:  -Group instruction provided by verbal instruction,  video, and written materials to support subject matter.  Instructors review role of stress in heart disease and how to cope with stress positively.     Exercising on Your Own:  -Group instruction provided by verbal instruction, power point, and written materials to support subject.  Instructors discuss benefits of exercise, components of exercise, frequency and intensity of exercise, and end points for exercise.  Also discuss use of nitroglycerin and activating EMS.  Review options of places to exercise outside of rehab.  Review guidelines for sex with heart disease.   Cardiac Drugs I:  -Group instruction provided by verbal instruction and written materials to support subject.  Instructor reviews cardiac drug classes: antiplatelets, anticoagulants, beta blockers, and statins.  Instructor discusses reasons, side effects, and lifestyle considerations for each drug class.   Cardiac Drugs II:  -Group instruction provided by verbal instruction and written materials to support subject.  Instructor reviews cardiac drug classes: angiotensin converting enzyme inhibitors (ACE-I), angiotensin II receptor blockers (ARBs), nitrates, and calcium channel blockers.  Instructor discusses reasons, side effects, and lifestyle considerations for each drug class.   Anatomy and Physiology of the Circulatory System:  -Group instruction provided by verbal instruction, video, and written materials to support subject.  Reviews functional anatomy of heart, how it relates to various diagnoses, and what role the heart plays in the overall system.   Knowledge Questionnaire Score:     Knowledge Questionnaire Score - 06/03/15 1212    Knowledge Questionnaire Score   Pre Score 18/24      Core Components/Risk Factors/Patient Goals at Admission:     Personal Goals and Risk Factors at Admission - 06/03/15 0836    Core Components/Risk Factors/Patient Goals on Admission    Weight Management Obesity;Yes;Weight Loss    Intervention Obesity: Provide education and appropriate resources to help participant work on and attain dietary goals.;Weight Management/Obesity: Establish reasonable short term and long term weight goals.;Weight Management: Provide education and appropriate resources to help participant work on and attain dietary goals.;Weight Management: Develop a combined nutrition and exercise program designed to reach desired caloric intake, while maintaining appropriate intake of nutrient and fiber, sodium and fats, and appropriate energy expenditure required for the weight goal.   Admit Weight 268 lb 8.3 oz (121.8 kg)   Goal Weight: Short Term 265 lb (120.203 kg)   Goal Weight: Long Term 260 lb (117.935 kg)   Expected Outcomes Short Term: Continue to assess and modify interventions until short term weight is achieved;Long Term: Adherence to nutrition and physical activity/exercise program aimed toward attainment of established weight goal;Weight Loss: Understanding of general recommendations for a balanced deficit meal plan, which promotes 1-2 lb weight loss per week and includes a negative energy balance of [phone] kcal/d;Understanding recommendations for meals to include 15-35% energy as protein, 25-35% energy from fat, 35-60% energy from carbohydrates, less than 200mg  of dietary cholesterol, 20-35 gm of total fiber daily;Understanding of distribution of calorie intake throughout the day with the consumption of 4-5 meals/snacks   Sedentary Yes   Intervention Provide advice, education, support and counseling about physical activity/exercise needs.;Develop an individualized exercise prescription for aerobic and resistive training based on initial evaluation findings, risk stratification, comorbidities and participant's personal goals.   Expected Outcomes Achievement of increased cardiorespiratory fitness and enhanced flexibility, muscular endurance and strength shown through measurements  of functional capacity and  personal statement of participant.   Increase Strength and Stamina Yes   Intervention Provide advice, education, support and counseling about physical activity/exercise needs.;Develop an individualized exercise prescription for aerobic and resistive training based on initial evaluation findings, risk stratification, comorbidities and participant's personal goals.   Expected Outcomes Achievement of increased cardiorespiratory fitness and enhanced flexibility, muscular endurance and strength shown through measurements of functional capacity and personal statement of participant.   Tobacco Cessation Yes  quit 1970, 2 pk/yrs   Improve shortness of breath with ADL's Yes   Intervention Provide education, individualized exercise plan and daily activity instruction to help decrease symptoms of SOB with activities of daily living.   Expected Outcomes Short Term: Achieves a reduction of symptoms when performing activities of daily living.   Heart Failure Yes   Intervention Provide a combined exercise and nutrition program that is supplemented with education, support and counseling about heart failure. Directed toward relieving symptoms such as shortness of breath, decreased exercise tolerance, and extremity edema.   Expected Outcomes Improve functional capacity of life;Short term: Attendance in program 2-3 days a week with increased exercise capacity. Reported lower sodium intake. Reported increased fruit and vegetable intake. Reports medication compliance.;Short term: Daily weights obtained and reported for increase. Utilizing diuretic protocols set by physician.;Long term: Adoption of self-care skills and reduction of barriers for early signs and symptoms recognition and intervention leading to self-care maintenance.   Hypertension Yes   Intervention Provide education on lifestyle modifcations including regular physical activity/exercise, weight management, moderate sodium restriction and increased consumption  of fresh fruit, vegetables, and low fat dairy, alcohol moderation, and smoking cessation.;Monitor prescription use compliance.   Expected Outcomes Short Term: Continued assessment and intervention until BP is < 140/17mm HG in hypertensive participants. < 130/20mm HG in hypertensive participants with diabetes, heart failure or chronic kidney disease.;Long Term: Maintenance of blood pressure at goal levels.   Lipids Yes   Intervention Provide education and support for participant on nutrition & aerobic/resistive exercise along with prescribed medications to achieve LDL 70mg , HDL >40mg .   Expected Outcomes Short Term: Participant states understanding of desired cholesterol values and is compliant with medications prescribed. Participant is following exercise prescription and nutrition guidelines.;Long Term: Cholesterol controlled with medications as prescribed, with individualized exercise RX and with personalized nutrition plan. Value goals: LDL < 70mg , HDL > 40 mg.   Stress Yes   Intervention Offer individual and/or small group education and counseling on adjustment to heart disease, stress management and health-related lifestyle change. Teach and support self-help strategies.;Refer participants experiencing significant psychosocial distress to appropriate mental health specialists for further evaluation and treatment. When possible, include family members and significant others in education/counseling sessions.   Expected Outcomes Short Term: Participant demonstrates changes in health-related behavior, relaxation and other stress management skills, ability to obtain effective social support, and compliance with psychotropic medications if prescribed.;Long Term: Emotional wellbeing is indicated by absence of clinically significant psychosocial distress or social isolation.   Personal Goal Other Yes   Personal Goal Improve diet and improve motor skills (improve balance)   Intervention Provide education on  diet and exercise, diet guidelines, balance exercises   Expected Outcomes Adherance to diet and exericse guidelines      Core Components/Risk Factors/Patient Goals Review:    Core Components/Risk Factors/Patient Goals at Discharge (Final Review):    ITP Comments:     ITP Comments      06/03/15 0834  ITP Comments Medical Director-Dr. Fransico Him, MD          Comments: Patient attended orientation from 0800 to 1000 to review rules and guidelines for program. Completed 6 minute walk test, Intitial ITP, and exercise prescription.  VSS. Telemetry-Sinus Rhythm.  .Mr Limon did report complaints of shortness of breath and fatigue during his walk test. Oxygen saturations 90%-97% no other complaints were voiced.Will continue to monitor the patient throughout  the program. 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

## 2015-06-03 NOTE — Progress Notes (Signed)
Cardiac Rehab Medication Review by a Pharmacist  Does the patient  feel that his/her medications are working for him/her?  yes  Has the patient been experiencing any side effects to the medications prescribed?  no  Does the patient measure his/her own blood pressure or blood glucose at home?  yes   Does the patient have any problems obtaining medications due to transportation or finances?   no  Understanding of regimen: excellent Understanding of indications: good Potential of compliance: excellent    Judieth Keens, PharmD 06/03/2015 8:29 AM

## 2015-06-07 ENCOUNTER — Encounter (HOSPITAL_COMMUNITY)
Admission: RE | Admit: 2015-06-07 | Discharge: 2015-06-07 | Disposition: A | Discharge status: Home / Self Care | Payer: Medicare Other | Source: Ambulatory Visit | Attending: Cardiology | Admitting: Cardiology

## 2015-06-07 ENCOUNTER — Telehealth: Payer: Self-pay | Admitting: Cardiology

## 2015-06-07 DIAGNOSIS — Z79899 Other long term (current) drug therapy: Secondary | ICD-10-CM | POA: Diagnosis not present

## 2015-06-07 DIAGNOSIS — Z7901 Long term (current) use of anticoagulants: Secondary | ICD-10-CM | POA: Diagnosis not present

## 2015-06-07 DIAGNOSIS — I5042 Chronic combined systolic (congestive) and diastolic (congestive) heart failure: Secondary | ICD-10-CM | POA: Diagnosis not present

## 2015-06-07 DIAGNOSIS — I130 Hypertensive heart and chronic kidney disease with heart failure and stage 1 through stage 4 chronic kidney disease, or unspecified chronic kidney disease: Secondary | ICD-10-CM | POA: Diagnosis not present

## 2015-06-07 DIAGNOSIS — E669 Obesity, unspecified: Secondary | ICD-10-CM | POA: Diagnosis not present

## 2015-06-07 DIAGNOSIS — K219 Gastro-esophageal reflux disease without esophagitis: Secondary | ICD-10-CM | POA: Diagnosis not present

## 2015-06-07 LAB — GLUCOSE, CAPILLARY: GLUCOSE-CAPILLARY: 121 mg/dL — AB (ref 65–99)

## 2015-06-07 NOTE — Progress Notes (Signed)
Pt started cardiac rehab today. . VSS, telemetry-Sinus, asymptomatic.  Medication list reconciled. Pt denies barriers to medicaiton compliance.  PSYCHOSOCIAL ASSESSMENT:  PHQ-0. Pt exhibits positive coping skills, hopeful outlook with supportive family. No psychosocial needs identified at this time, no psychosocial interventions necessary.    Pt enjoys watching TV and going to the park.   Pt oriented to exercise equipment and routine.    Understanding verbalized. Initial  Resting blood pressure 162/80. Hashim took his medications as prescribed. Heart rate 60's. Mr Mckowen did walk over to cardiac rehab from [place] parking. Mr Gilcrest was encouraged to use a wheel chair until the patient is given a parking badge for the parking garage. Lars Pinks triage nurse at Dr Jacalyn Lefevre office called and notified  of today's blood pressure elevations. Recheck blood pressure 118/60 sitting and 122/60 standing. Dijon proceeded to exercise without difficulty.

## 2015-06-07 NOTE — Telephone Encounter (Signed)
Received phone call back from [facility] at cardiac rehab. Pt's BP is down to 122/58 post exercise. The highest BP was 162/78 on recumbant bike. Pt with no complaints. Pt stated that he had to walk a long way from the parking lot right before his first BP was taken as noted in previous call. Verdis Frederickson suggested him to ride in a wheelchair from the parking lot next time. Verdis Frederickson will send over BP's from today via fax.   Will route message to Dr Stanford Breed and Fredia Beets for review.

## 2015-06-07 NOTE — Telephone Encounter (Signed)
Maria from cardiac rehab called about pt's BP 162/80, HR 62, 97% O2 ra at resting prior to starting activity. Pt stated his BP when he takes it at home has been 130-140's/60's. Verdis Frederickson said she would walk him on the track and then call us back with his updated BP.

## 2015-06-07 NOTE — Telephone Encounter (Signed)
Continue to monitor bp meds, no need for wheelchair Jose Snyder

## 2015-06-08 NOTE — Telephone Encounter (Signed)
Patient states his first BP reading was after walking from parking lot. He states his BP was fluctuating a lot at rehab. He has been monitoring his BP at home and readings are good. Informed him that MD advised to continue meds and monitoring BP - he agrees with plan.

## 2015-06-09 ENCOUNTER — Encounter (HOSPITAL_COMMUNITY)
Admission: RE | Admit: 2015-06-09 | Discharge: 2015-06-09 | Disposition: A | Discharge status: Home / Self Care | Payer: Medicare Other | Source: Ambulatory Visit | Attending: Cardiology | Admitting: Cardiology

## 2015-06-09 DIAGNOSIS — I130 Hypertensive heart and chronic kidney disease with heart failure and stage 1 through stage 4 chronic kidney disease, or unspecified chronic kidney disease: Secondary | ICD-10-CM | POA: Diagnosis not present

## 2015-06-09 DIAGNOSIS — K219 Gastro-esophageal reflux disease without esophagitis: Secondary | ICD-10-CM | POA: Diagnosis not present

## 2015-06-09 DIAGNOSIS — E669 Obesity, unspecified: Secondary | ICD-10-CM | POA: Diagnosis not present

## 2015-06-09 DIAGNOSIS — Z79899 Other long term (current) drug therapy: Secondary | ICD-10-CM | POA: Diagnosis not present

## 2015-06-09 DIAGNOSIS — I5042 Chronic combined systolic (congestive) and diastolic (congestive) heart failure: Secondary | ICD-10-CM | POA: Diagnosis not present

## 2015-06-09 DIAGNOSIS — Z7901 Long term (current) use of anticoagulants: Secondary | ICD-10-CM | POA: Diagnosis not present

## 2015-06-11 ENCOUNTER — Encounter (HOSPITAL_COMMUNITY)
Admission: RE | Admit: 2015-06-11 | Discharge: 2015-06-11 | Disposition: A | Discharge status: Home / Self Care | Payer: Medicare Other | Source: Ambulatory Visit | Attending: Cardiology | Admitting: Cardiology

## 2015-06-11 DIAGNOSIS — I5042 Chronic combined systolic (congestive) and diastolic (congestive) heart failure: Secondary | ICD-10-CM

## 2015-06-11 DIAGNOSIS — Z7901 Long term (current) use of anticoagulants: Secondary | ICD-10-CM | POA: Diagnosis not present

## 2015-06-11 DIAGNOSIS — I130 Hypertensive heart and chronic kidney disease with heart failure and stage 1 through stage 4 chronic kidney disease, or unspecified chronic kidney disease: Secondary | ICD-10-CM | POA: Diagnosis not present

## 2015-06-11 DIAGNOSIS — Z79899 Other long term (current) drug therapy: Secondary | ICD-10-CM | POA: Diagnosis not present

## 2015-06-11 DIAGNOSIS — E669 Obesity, unspecified: Secondary | ICD-10-CM | POA: Diagnosis not present

## 2015-06-11 DIAGNOSIS — K219 Gastro-esophageal reflux disease without esophagitis: Secondary | ICD-10-CM | POA: Diagnosis not present

## 2015-06-14 ENCOUNTER — Encounter (HOSPITAL_COMMUNITY)
Admission: RE | Admit: 2015-06-14 | Discharge: 2015-06-14 | Disposition: A | Discharge status: Home / Self Care | Payer: Medicare Other | Source: Ambulatory Visit | Attending: Cardiology | Admitting: Cardiology

## 2015-06-14 DIAGNOSIS — E669 Obesity, unspecified: Secondary | ICD-10-CM | POA: Diagnosis not present

## 2015-06-14 DIAGNOSIS — Z7901 Long term (current) use of anticoagulants: Secondary | ICD-10-CM | POA: Diagnosis not present

## 2015-06-14 DIAGNOSIS — I130 Hypertensive heart and chronic kidney disease with heart failure and stage 1 through stage 4 chronic kidney disease, or unspecified chronic kidney disease: Secondary | ICD-10-CM | POA: Diagnosis not present

## 2015-06-14 DIAGNOSIS — I5042 Chronic combined systolic (congestive) and diastolic (congestive) heart failure: Secondary | ICD-10-CM

## 2015-06-14 DIAGNOSIS — Z79899 Other long term (current) drug therapy: Secondary | ICD-10-CM | POA: Diagnosis not present

## 2015-06-14 DIAGNOSIS — K219 Gastro-esophageal reflux disease without esophagitis: Secondary | ICD-10-CM | POA: Diagnosis not present

## 2015-06-14 NOTE — Progress Notes (Signed)
Jose Snyder 75 y.o. male Nutrition Note Spoke with pt. Nutrition Plan and Nutrition Survey goals reviewed with pt. Pt is working toward following the Therapeutic Lifestyle Changes diet. Pt wants to lose wt. Pt has been trying to lose wt by making healthier food choices. Wt loss tips reviewed.  Pt is pre-diabetic according to his last A1c. Pt was unaware of pre-diabetes and states "I'm sure the [place] doctor is keeping a check on it." Pre-diabetes discussed. Pt encouraged to discuss pre-diabetes/diabetes and any further testing with his PCP. Pt with dx of CHF. Per discussion, pt unable to avoid sodium due to frequency of eating out. Pt eats out 5 meals/week. Pt states "I haven't been making the healthiest choices when I eat out, but I'm getting better. Pt expressed understanding of the information reviewed. Pt aware of nutrition education classes offered.  Lab Results  Component Value Date   HGBA1C 6.3* 10/27/2013   Wt Readings from Last 3 Encounters:  06/03/15 268 lb 8.3 oz (121.8 kg)  04/28/15 270 lb 1.9 oz (122.526 kg)  11/23/14 268 lb (121.564 kg)    Nutrition Diagnosis ? Food-and nutrition-related knowledge deficit related to lack of exposure to information as related to diagnosis of: ? CVD ? Pre-DM ? Obesity related to excessive energy intake as evidenced by a BMI of 35.5  Nutrition RX/ Estimated Daily Nutrition Needs for: wt loss 1800-2300 Kcal, 50-60 gm fat, 12-15 gm sat fat, 1.8-2.3 gm trans-fat, <1500 mg sodium  Nutrition Intervention ? Pt's individual nutrition plan reviewed with pt. ? Benefits of adopting Therapeutic Lifestyle Changes discussed when Med[employer]icts reviewed. ? Pt to attend the Portion Distortion class ? Pt to attend the Diabetes Q & A class ? Pt to attend the   ? Nutrition I class                  ? Nutrition II class ? Pt given handouts for: ? Nutrition I class ? Nutrition II class ? 5 day menu ideas ? pre-diabetes ? meal delivery services that cater to health  needs ? Continue client-centered nutrition education by RD, as part of interdisciplinary care. Goal(s) ? Pt to identify and limit food sources of saturated fat, trans fat, and sodium ? Pt to identify food quantities necessary to achieve weight loss of 6-24 lb (2.7-10.9 kg) at graduation from cardiac rehab.  Monitor and Evaluate progress toward nutrition goal with team. Derek Mound, M.Ed, RD, LDN, CDE 06/14/2015 10:49 AM

## 2015-06-16 ENCOUNTER — Encounter (HOSPITAL_COMMUNITY)
Admission: RE | Admit: 2015-06-16 | Discharge: 2015-06-16 | Disposition: A | Discharge status: Home / Self Care | Payer: Medicare Other | Source: Ambulatory Visit | Attending: Cardiology | Admitting: Cardiology

## 2015-06-16 ENCOUNTER — Encounter: Payer: Self-pay | Admitting: Cardiology

## 2015-06-16 DIAGNOSIS — Z7901 Long term (current) use of anticoagulants: Secondary | ICD-10-CM | POA: Diagnosis not present

## 2015-06-16 DIAGNOSIS — I5042 Chronic combined systolic (congestive) and diastolic (congestive) heart failure: Secondary | ICD-10-CM | POA: Diagnosis not present

## 2015-06-16 DIAGNOSIS — I130 Hypertensive heart and chronic kidney disease with heart failure and stage 1 through stage 4 chronic kidney disease, or unspecified chronic kidney disease: Secondary | ICD-10-CM | POA: Diagnosis not present

## 2015-06-16 DIAGNOSIS — E669 Obesity, unspecified: Secondary | ICD-10-CM | POA: Diagnosis not present

## 2015-06-16 DIAGNOSIS — Z79899 Other long term (current) drug therapy: Secondary | ICD-10-CM | POA: Diagnosis not present

## 2015-06-16 DIAGNOSIS — K219 Gastro-esophageal reflux disease without esophagitis: Secondary | ICD-10-CM | POA: Diagnosis not present

## 2015-06-18 ENCOUNTER — Encounter (HOSPITAL_COMMUNITY)
Admission: RE | Admit: 2015-06-18 | Discharge: 2015-06-18 | Disposition: A | Discharge status: Home / Self Care | Payer: Medicare Other | Source: Ambulatory Visit | Attending: Cardiology | Admitting: Cardiology

## 2015-06-18 DIAGNOSIS — K219 Gastro-esophageal reflux disease without esophagitis: Secondary | ICD-10-CM | POA: Diagnosis not present

## 2015-06-18 DIAGNOSIS — Z79899 Other long term (current) drug therapy: Secondary | ICD-10-CM | POA: Diagnosis not present

## 2015-06-18 DIAGNOSIS — I130 Hypertensive heart and chronic kidney disease with heart failure and stage 1 through stage 4 chronic kidney disease, or unspecified chronic kidney disease: Secondary | ICD-10-CM | POA: Diagnosis not present

## 2015-06-18 DIAGNOSIS — Z7901 Long term (current) use of anticoagulants: Secondary | ICD-10-CM | POA: Diagnosis not present

## 2015-06-18 DIAGNOSIS — I5042 Chronic combined systolic (congestive) and diastolic (congestive) heart failure: Secondary | ICD-10-CM | POA: Diagnosis not present

## 2015-06-18 DIAGNOSIS — E669 Obesity, unspecified: Secondary | ICD-10-CM | POA: Diagnosis not present

## 2015-06-18 NOTE — Progress Notes (Signed)
Reviewed home exercise with pt today.  Pt plans to walk and use kettlebells at [place] for exercise.  Reviewed THR, pulse, RPE, sign and symptoms, and when to call 911 or MD.  Also discussed weather considerations and indoor options.  Pt voiced understanding. Alberteen Sam, MA, ACSM RCEP 06/18/2015 10:12 AM

## 2015-06-18 NOTE — Progress Notes (Signed)
Cardiac Individual Treatment Plan  Patient Details  Name: Jose Snyder MRN: [id number] Date of Birth: 28-Feb-1940 Referring Provider:        CARDIAC REHAB PHASE II ORIENTATION from 06/03/2015 in [place]   Referring Provider  Kirk Ruths MD      Initial Encounter Date:       CARDIAC REHAB PHASE II ORIENTATION from 06/03/2015 in [facility]   Date  06/03/15   Referring Provider  Kirk Ruths MD      Visit Diagnosis: No diagnosis found.  Patient's Home Medications on Admission:  Current outpatient prescriptions:  .  apixaban (ELIQUIS) 5 MG TABS tablet, Take 1 tablet (5 mg total) by mouth 2 (two) times daily., Disp: 60 tablet, Rfl: 11 .  atorvastatin (LIPITOR) 80 MG tablet, Take 1 tablet (80 mg total) by mouth daily with breakfast., Disp: 30 tablet, Rfl: 12 .  betamethasone dipropionate (DIPROLENE) 0.05 % ointment, Apply topically 2 (two) times daily., Disp: , Rfl:  .  Cholecalciferol (VITAMIN D) 2000 UNITS tablet, Take 2,000 Units by mouth daily., Disp: , Rfl:  .  docusate sodium (COLACE) 100 MG capsule, Take 100 mg by mouth daily as needed for mild constipation., Disp: , Rfl:  .  fluticasone (FLONASE) 50 MCG/ACT nasal spray, Place 1 spray into both nostrils daily as needed for allergies or rhinitis. , Disp: , Rfl:  .  furosemide (LASIX) 40 MG tablet, Take 1 tablet (40 mg total) by mouth 2 (two) times daily. If swelling takes an extra dose, Disp: 60 tablet, Rfl: 6 .  ibuprofen (ADVIL,MOTRIN) 200 MG tablet, Take 400 mg by mouth daily., Disp: , Rfl:  .  lisinopril (PRINIVIL,ZESTRIL) 20 MG tablet, Take 1 tablet (20 mg total) by mouth daily with breakfast., Disp: 30 tablet, Rfl: 12 .  metoprolol succinate (TOPROL-XL) 100 MG 24 hr tablet, Take 1 tablet (100 mg total) by mouth daily., Disp: 30 tablet, Rfl: 12 .  omeprazole (PRILOSEC) 20 MG capsule, Take 20 mg by mouth daily., Disp: , Rfl:  .  potassium chloride SA  (K-DUR,KLOR-CON) 20 MEQ tablet, Take 1 tablet (20 mEq total) by mouth daily., Disp: 30 tablet, Rfl: 11 .  temazepam (RESTORIL) 15 MG capsule, Take 15 mg by mouth at bedtime as needed. , Disp: , Rfl:   Past Medical History: Past Medical History  Diagnosis Date  . Hypertension   . Obesity   . GERD (gastroesophageal reflux disease)   . Chronic combined systolic and diastolic heart failure ([place]) 06/26/2013    a.  Echo (06/25/13):  EF 20-25%, diff HK, restrictive physio, mild MR, mod to severe LAE, mild RVE, mildly reduced RVSF, mod to severe RAE  . NICM (nonischemic cardiomyopathy) with EF 20-25% 06/26/2013  . Atrial Fibrillation  06/25/2013  . Dyslipidemia 07/15/2013  . CAD (coronary artery disease), native coronary artery 06/28/2013    a. LHC (06/27/13):  Mid LAD 40%, mid D1 75%, mid CFX 50-60%, mid RCA 50%, EF 20%, LVEDP 24 mmHg.  Marland Kitchen Penetrating atherosclerotic ulcer of aorta ([place]) 10/22/2013    Small penetrating ulcer of descending thoracic aorta discovered on routine CTA  . Shortness of breath     seen by [facility], & low energy   . CHF (congestive heart failure) ([place])   . Dysrhythmia     a-fib  . Anxiety   . Arthritis     R hand- OA  . Prostate cancer [facility])     watchful waiting, no treatment so  far  . S/P Maze operation for atrial fibrillation 10/29/2013    Complete bilateral atrial lesion set using cryothermy and bipolar radiofrequency ablation with clipping of LA appendage via right mini thoracotomy  . Chronic kidney disease   . Incidental pulmonary nodule 06/22/2014    Seen on CT angiogram of chest    Tobacco Use: History  Smoking status  . Never Smoker   Smokeless tobacco  . Never Used    Comment: 06/25/2013 "smoked very light; years and years ago"    Labs: Recent Review Flowsheet Data    Labs for ITP Cardiac and Pulmonary Rehab Latest Ref Rng 10/30/2013 10/30/2013 10/30/2013 10/30/2013 05/20/2015   Cholestrol 125 - 200 mg/dL - - - - 117(L)   LDLCALC <130 mg/dL - - - - 32   HDL >=40  mg/dL - - - - 43   Trlycerides <150 mg/dL - - - - 211(H)   PHART 7.350 - 7.450 7.406 7.425 - 7.448 -   PCO2ART 35.0 - 45.0 mmHg 32.8(L) 30.7(L) - 29.4(L) -   HCO3 20.0 - 24.0 mEq/L 20.8 20.2 - 20.5 -   TCO2 0 - 100 mmol/L 22 21 19 21  -   ACIDBASEDEF 0.0 - 2.0 mmol/L 3.0(H) 3.0(H) - 3.0(H) -   O2SAT - 90.0 88.0 - 95.0 -      Capillary Blood Glucose: Lab Results  Component Value Date   GLUCAP 121* 06/07/2015   GLUCAP 139* 10/31/2013   GLUCAP 108* 10/31/2013   GLUCAP 110* 10/30/2013   GLUCAP 136* 10/30/2013     Exercise Target Goals:    Exercise Program Goal: Individual exercise prescription set with THRR, safety & activity barriers. Participant demonstrates ability to understand and report RPE using BORG scale, to self-measure pulse accurately, and to acknowledge the importance of the exercise prescription.  Exercise Prescription Goal: Starting with aerobic activity 30 plus minutes a day, 3 days per week for initial exercise prescription. Provide home exercise prescription and guidelines that participant acknowledges understanding prior to discharge.  Activity Barriers & Risk Stratification:     Activity Barriers & Cardiac Risk Stratification - 06/03/15 0834    Activity Barriers & Cardiac Risk Stratification   Activity Barriers Deconditioning;Muscular Weakness;Balance Concerns;Left Knee Replacement;Arthritis;Shortness of Breath   Cardiac Risk Stratification High      6 Minute Walk:     6 Minute Walk      06/03/15 0800       6 Minute Walk   Phase Initial     Distance 688 feet     Walk Time 5.23 minutes     # of Rest Breaks 1     MPH 1.49     METS 1.36     RPE 13     Perceived Dyspnea  2  95%     VO2 Peak 4.79     Symptoms Yes (comment)     Comments Shortness of breath     Resting HR 84 bpm     Resting BP 136/74 mmHg     Max Ex. HR 113 bpm     Max Ex. BP 140/80 mmHg     2 Minute Post BP 122/62 mmHg        Initial Exercise Prescription:     Initial  Exercise Prescription - 06/10/15 1300    Date of Initial Exercise RX and Referring Provider   Date 06/03/15   Referring Provider Kirk Ruths MD   Recumbant Bike   Level 1   RPM 30   Pascal Lux  5   Minutes 10   METs 1.4   T5 Nustep   Level 1   Minutes 10   METs 1.5   Track   Laps 5   Minutes 10   METs 0.84   Prescription Details   Frequency (times per week) 3   Intensity   THRR 40-80% of Max Heartrate 58-117   Ratings of Perceived Exertion 11-13   Perceived Dyspnea 0-4   Progression   Progression Continue to progress workloads to maintain intensity without signs/symptoms of physical distress.   Resistance Training   Training Prescription Yes   Weight 2 lbs   Reps 10-12      Perform Capillary Blood Glucose checks as needed.  Exercise Prescription Changes:     Exercise Prescription Changes      06/15/15 1000           Exercise Review   Progression Yes       Response to Exercise   Blood Pressure (Admit) 160/82 mmHg       Blood Pressure (Exercise) 182/90 mmHg       Blood Pressure (Exit) 110/64 mmHg       Heart Rate (Admit) 80 bpm       Heart Rate (Exercise) 114 bpm       Heart Rate (Exit) 61 bpm       Oxygen Saturation (Exercise) 97 %       Rating of Perceived Exertion (Exercise) 13       Symptoms none       Duration Progress to 30 minutes of continuous aerobic without signs/symptoms of physical distress       Intensity THRR unchanged       Progression   Progression Continue to progress workloads to maintain intensity without signs/symptoms of physical distress.       Average METs 2.3       Resistance Training   Training Prescription Yes       Weight 2 lbs       Reps 10-12       Interval Training   Interval Training No       Recumbant Bike   Level 2       Watts 24       Minutes 10       METs 2.6       T5 Nustep   Level 3       Minutes 10       METs 2.2       Track   Laps 6       Minutes 10       METs 2          Exercise Comments:      Exercise Comments      06/15/15 1021           Exercise Comments Pt is tolerating exercise well and continues to make progress.  He has had some elevated blood pressures.  Will continue to monitor.          Discharge Exercise Prescription (Final Exercise Prescription Changes):     Exercise Prescription Changes - 06/15/15 1000    Exercise Review   Progression Yes   Response to Exercise   Blood Pressure (Admit) 160/82 mmHg   Blood Pressure (Exercise) 182/90 mmHg   Blood Pressure (Exit) 110/64 mmHg   Heart Rate (Admit) 80 bpm   Heart Rate (Exercise) 114 bpm   Heart Rate (Exit) 61 bpm   Oxygen Saturation (Exercise) 97 %  Rating of Perceived Exertion (Exercise) 13   Symptoms none   Duration Progress to 30 minutes of continuous aerobic without signs/symptoms of physical distress   Intensity THRR unchanged   Progression   Progression Continue to progress workloads to maintain intensity without signs/symptoms of physical distress.   Average METs 2.3   Resistance Training   Training Prescription Yes   Weight 2 lbs   Reps 10-12   Interval Training   Interval Training No   Recumbant Bike   Level 2   Watts 24   Minutes 10   METs 2.6   T5 Nustep   Level 3   Minutes 10   METs 2.2   Track   Laps 6   Minutes 10   METs 2      Nutrition:  Target Goals: Understanding of nutrition guidelines, daily intake of sodium 1500mg , cholesterol 200mg , calories 30% from fat and 7% or less from saturated fats, daily to have 5 or more servings of fruits and vegetables.  Biometrics:     Pre Biometrics - 06/03/15 1213    Pre Biometrics   Height 6\' 1"  (1.854 m)   Weight 268 lb 8.3 oz (121.8 kg)   Waist Circumference 46 inches   Hip Circumference 46 inches   Waist to Hip Ratio 1 %   BMI (Calculated) 35.5   Triceps Skinfold 25 mm   % Body Fat 34.9 %   Grip Strength 30 kg   Flexibility 0 in   Single Leg Stand 2.21 seconds       Nutrition Therapy Plan and Nutrition Goals:      Nutrition Therapy & Goals - 06/14/15 1100    Nutrition Therapy   Diet Therapeutic Lifestyle Changes   Personal Nutrition Goals   Personal Goal #1 0.5-2 lb weight loss per week to a goal wt loss of 6-24 lb at graduation from [place], educate and counsel regarding individualized specific dietary modifications aiming towards targeted core components such as weight, hypertension, lipid management, diabetes, heart failure and other comorbidities.;Nutrition handout(s) given to patient.  Handouts for Nutrition I & II classes, 5 day menu ideas, and a list of meal preparation companys that cater to special dietary needs   Expected Outcomes Short Term Goal: Understand basic principles of dietary content, such as calories, fat, sodium, cholesterol and nutrients.;Long Term Goal: Adherence to prescribed nutrition plan.      Nutrition Discharge: Nutrition Scores:     Nutrition Assessments - 06/03/15 1457    MEDFICTS Scores   Pre Score 78      Nutrition Goals Re-Evaluation:   Psychosocial: Target Goals: Acknowledge presence or absence of depression, maximize coping skills, provide positive support system. Participant is able to verbalize types and ability to use techniques and skills needed for reducing stress and depression.  Initial Review & Psychosocial Screening:     Initial Psych Review & Screening - 06/07/15 1146    Family Dynamics   Comments mr Baranek's wife passed away in 11-Jun-2012      Quality of Life Scores:     Quality of Life - 06/03/15 1214    Quality of Life Scores   Health/Function Pre 25.43 %   Socioeconomic Pre 28.21 %   Psych/Spiritual Pre 28.29 %   Family Pre 27 %   GLOBAL Pre 26.85 %      PHQ-9:     Recent Review Flowsheet Data    Depression screen [facility] 2/9 06/07/2015  Decreased Interest 0   Down, Depressed, Hopeless 0   PHQ - 2 Score 0      Psychosocial Evaluation and Intervention:   Psychosocial  Re-Evaluation:     Psychosocial Re-Evaluation      06/18/15 0936           Psychosocial Re-Evaluation   Interventions Encouraged to attend Cardiac Rehabilitation for the exercise;Stress management education       Continued Psychosocial Services Needed No          Vocational Rehabilitation: Provide vocational rehab assistance to qualifying candidates.   Vocational Rehab Evaluation & Intervention:     Vocational Rehab - 06/04/15 0905    Initial Vocational Rehab Evaluation & Intervention   Assessment shows need for Vocational Rehabilitation No  Mr Riehm is retired and does not need vocational rehab      Education: Education Goals: Education classes will be provided on a weekly basis, covering required topics. Participant will state understanding/return demonstration of topics presented.  Learning Barriers/Preferences:     Learning Barriers/Preferences - 06/03/15 1212    Learning Barriers/Preferences   Learning Barriers Sight;Hearing  reading glasses, bilateral hearing loss, no hearing aids   Learning Preferences Individual Instruction;Verbal Instruction;Written Material      Education Topics: Count Your Pulse:  -Group instruction provided by verbal instruction, demonstration, patient participation and written materials to support subject.  Instructors address importance of being able to find your pulse and how to count your pulse when at home without a heart monitor.  Patients get hands on experience counting their pulse with staff help and individually.   Heart Attack, Angina, and Risk Factor Modification:  -Group instruction provided by verbal instruction, video, and written materials to support subject.  Instructors address signs and symptoms of angina and heart attacks.    Also discuss risk factors for heart disease and how to make changes to improve heart health risk factors.   Functional Fitness:  -Group instruction provided by verbal instruction, demonstration,  patient participation, and written materials to support subject.  Instructors address safety measures for doing things around the house.  Discuss how to get up and down off the floor, how to pick things up properly, how to safely get out of a chair without assistance, and balance training.   Meditation and Mindfulness:  -Group instruction provided by verbal instruction, patient participation, and written materials to support subject.  Instructor addresses importance of mindfulness and meditation practice to help reduce stress and improve awareness.  Instructor also leads participants through a meditation exercise.    Stretching for Flexibility and Mobility:  -Group instruction provided by verbal instruction, patient participation, and written materials to support subject.  Instructors lead participants through series of stretches that are designed to increase flexibility thus improving mobility.  These stretches are additional exercise for major muscle groups that are typically performed during regular warm up and cool down.          CARDIAC REHAB PHASE II EXERCISE from 06/16/2015 in [place]   Date  06/11/15   Educator  Alberteen Sam, MA, ACSM RCEP   Instruction Review Code  2- meets goals/outcomes      Hands Only CPR Anytime:  -Group instruction provided by verbal instruction, video, patient participation and written materials to support subject.  Instructors co-teach with AHA video for hands only CPR.  Participants get hands on experience with mannequins.   Nutrition I class: Heart Healthy Eating:  -Group instruction provided by PowerPoint slides,  verbal discussion, and written materials to support subject matter. The instructor gives an explanation and review of the Therapeutic Lifestyle Changes diet recommendations, which includes a discussion on lipid goals, dietary fat, sodium, fiber, plant stanol/sterol esters, sugar, and the components of a  well-balanced, healthy diet.      CARDIAC REHAB PHASE II EXERCISE from 06/16/2015 in [place]   Date  06/14/15   Educator  RD   Instruction Review Code  Not applicable [Handout given]      Nutrition II class: Lifestyle Skills:  -Group instruction provided by PowerPoint slides, verbal discussion, and written materials to support subject matter. The instructor gives an explanation and review of label reading, grocery shopping for heart health, heart healthy recipe modifications, and ways to make healthier choices when eating out.      CARDIAC REHAB PHASE II EXERCISE from 06/16/2015 in [place]   Date  06/14/15   Educator  RD   Instruction Review Code  Not applicable [class handouts given]      Diabetes Question & Answer:  -Group instruction provided by PowerPoint slides, verbal discussion, and written materials to support subject matter. The instructor gives an explanation and review of diabetes co-morbidities, pre- and post-prandial blood glucose goals, pre-exercise blood glucose goals, signs, symptoms, and treatment of hypoglycemia and hyperglycemia, and foot care basics.   Diabetes Blitz:  -Group instruction provided by PowerPoint slides, verbal discussion, and written materials to support subject matter. The instructor gives an explanation and review of the physiology behind type 1 and type 2 diabetes, diabetes medications and rational behind using different medications, pre- and post-prandial blood glucose recommendations and Hemoglobin A1c goals, diabetes diet, and exercise including blood glucose guidelines for exercising safely.    Portion Distortion:  -Group instruction provided by PowerPoint slides, verbal discussion, written materials, and food models to support subject matter. The instructor gives an explanation of serving size versus portion size, changes in portions sizes over the last 20 years, and what consists  of a serving from each food group.   Stress Management:  -Group instruction provided by verbal instruction, video, and written materials to support subject matter.  Instructors review role of stress in heart disease and how to cope with stress positively.        CARDIAC REHAB PHASE II EXERCISE from 06/16/2015 in [place]   Date  06/16/15   Instruction Review Code  2- meets goals/outcomes      Exercising on Your Own:  -Group instruction provided by verbal instruction, power point, and written materials to support subject.  Instructors discuss benefits of exercise, components of exercise, frequency and intensity of exercise, and end points for exercise.  Also discuss use of nitroglycerin and activating EMS.  Review options of places to exercise outside of rehab.  Review guidelines for sex with heart disease.   Cardiac Drugs I:  -Group instruction provided by verbal instruction and written materials to support subject.  Instructor reviews cardiac drug classes: antiplatelets, anticoagulants, beta blockers, and statins.  Instructor discusses reasons, side effects, and lifestyle considerations for each drug class.      CARDIAC REHAB PHASE II EXERCISE from 06/16/2015 in [place]   Date  06/09/15   Educator  Pharm D   Instruction Review Code  2- meets goals/outcomes      Cardiac Drugs II:  -Group instruction provided by verbal instruction and written materials to support subject.  Instructor reviews cardiac drug classes: angiotensin converting enzyme inhibitors (ACE-I), angiotensin II receptor blockers (ARBs), nitrates, and calcium channel blockers.  Instructor discusses reasons, side effects, and lifestyle considerations for each drug class.   Anatomy and Physiology of the Circulatory System:  -Group instruction provided by verbal instruction, video, and written materials to support subject.  Reviews functional anatomy of heart,  how it relates to various diagnoses, and what role the heart plays in the overall system.   Knowledge Questionnaire Score:     Knowledge Questionnaire Score - 06/03/15 1212    Knowledge Questionnaire Score   Pre Score 18/24      Core Components/Risk Factors/Patient Goals at Admission:     Personal Goals and Risk Factors at Admission - 06/03/15 0836    Core Components/Risk Factors/Patient Goals on Admission    Weight Management Obesity;Yes;Weight Loss   Intervention Obesity: Provide education and appropriate resources to help participant work on and attain dietary goals.;Weight Management/Obesity: Establish reasonable short term and long term weight goals.;Weight Management: Provide education and appropriate resources to help participant work on and attain dietary goals.;Weight Management: Develop a combined nutrition and exercise program designed to reach desired caloric intake, while maintaining appropriate intake of nutrient and fiber, sodium and fats, and appropriate energy expenditure required for the weight goal.   Admit Weight 268 lb 8.3 oz (121.8 kg)   Goal Weight: Short Term 265 lb (120.203 kg)   Goal Weight: Long Term 260 lb (117.935 kg)   Expected Outcomes Short Term: Continue to assess and modify interventions until short term weight is achieved;Long Term: Adherence to nutrition and physical activity/exercise program aimed toward attainment of established weight goal;Weight Loss: Understanding of general recommendations for a balanced deficit meal plan, which promotes 1-2 lb weight loss per week and includes a negative energy balance of [phone] kcal/d;Understanding recommendations for meals to include 15-35% energy as protein, 25-35% energy from fat, 35-60% energy from carbohydrates, less than 200mg  of dietary cholesterol, 20-35 gm of total fiber daily;Understanding of distribution of calorie intake throughout the day with the consumption of 4-5 meals/snacks   Sedentary Yes    Intervention Provide advice, education, support and counseling about physical activity/exercise needs.;Develop an individualized exercise prescription for aerobic and resistive training based on initial evaluation findings, risk stratification, comorbidities and participant's personal goals.   Expected Outcomes Achievement of increased cardiorespiratory fitness and enhanced flexibility, muscular endurance and strength shown through measurements of functional capacity and personal statement of participant.   Increase Strength and Stamina Yes   Intervention Provide advice, education, support and counseling about physical activity/exercise needs.;Develop an individualized exercise prescription for aerobic and resistive training based on initial evaluation findings, risk stratification, comorbidities and participant's personal goals.   Expected Outcomes Achievement of increased cardiorespiratory fitness and enhanced flexibility, muscular endurance and strength shown through measurements of functional capacity and personal statement of participant.   Tobacco Cessation Yes  quit 1970, 2 pk/yrs   Improve shortness of breath with ADL's Yes   Intervention Provide education, individualized exercise plan and daily activity instruction to help decrease symptoms of SOB with activities of daily living.   Expected Outcomes Short Term: Achieves a reduction of symptoms when performing activities of daily living.   Heart Failure Yes   Intervention Provide a combined exercise and nutrition program that is supplemented with education, support and counseling about heart failure. Directed toward relieving symptoms such as shortness of breath, decreased exercise tolerance, and extremity edema.   Expected Outcomes Improve functional capacity of life;Short  term: Attendance in program 2-3 days a week with increased exercise capacity. Reported lower sodium intake. Reported increased fruit and vegetable intake. Reports medication  compliance.;Short term: Daily weights obtained and reported for increase. Utilizing diuretic protocols set by physician.;Long term: Adoption of self-care skills and reduction of barriers for early signs and symptoms recognition and intervention leading to self-care maintenance.   Hypertension Yes   Intervention Provide education on lifestyle modifcations including regular physical activity/exercise, weight management, moderate sodium restriction and increased consumption of fresh fruit, vegetables, and low fat dairy, alcohol moderation, and smoking cessation.;Monitor prescription use compliance.   Expected Outcomes Short Term: Continued assessment and intervention until BP is < 140/20mm HG in hypertensive participants. < 130/101mm HG in hypertensive participants with diabetes, heart failure or chronic kidney disease.;Long Term: Maintenance of blood pressure at goal levels.   Lipids Yes   Intervention Provide education and support for participant on nutrition & aerobic/resistive exercise along with prescribed medications to achieve LDL 70mg , HDL >40mg .   Expected Outcomes Short Term: Participant states understanding of desired cholesterol values and is compliant with medications prescribed. Participant is following exercise prescription and nutrition guidelines.;Long Term: Cholesterol controlled with medications as prescribed, with individualized exercise RX and with personalized nutrition plan. Value goals: LDL < 70mg , HDL > 40 mg.   Stress Yes   Intervention Offer individual and/or small group education and counseling on adjustment to heart disease, stress management and health-related lifestyle change. Teach and support self-help strategies.;Refer participants experiencing significant psychosocial distress to appropriate mental health specialists for further evaluation and treatment. When possible, include family members and significant others in education/counseling sessions.   Expected Outcomes Short  Term: Participant demonstrates changes in health-related behavior, relaxation and other stress management skills, ability to obtain effective social support, and compliance with psychotropic medications if prescribed.;Long Term: Emotional wellbeing is indicated by absence of clinically significant psychosocial distress or social isolation.   Personal Goal Other Yes   Personal Goal Improve diet and improve motor skills (improve balance)   Intervention Provide education on diet and exercise, diet guidelines, balance exercises   Expected Outcomes Adherance to diet and exericse guidelines      Core Components/Risk Factors/Patient Goals Review:      Goals and Risk Factor Review      06/16/15 1044           Core Components/Risk Factors/Patient Goals Review   Personal Goals Review Weight Management/Obesity;Sedentary;Increase Strength and Stamina;Improve shortness of breath with ADL's;Hypertension;Other       Review Pt is losing a little weight, has made some changes to his diet and using kettle bells at home.  Will review home ex on 4/20.  Breathing is better with ADLs, just not exercise yet.  Blood pressures are stable.       Expected Outcomes Conintue to improve diet and add exercise to improve cardiorespiratory fitness.          Core Components/Risk Factors/Patient Goals at Discharge (Final Review):      Goals and Risk Factor Review - 06/16/15 1044    Core Components/Risk Factors/Patient Goals Review   Personal Goals Review Weight Management/Obesity;Sedentary;Increase Strength and Stamina;Improve shortness of breath with ADL's;Hypertension;Other   Review Pt is losing a little weight, has made some changes to his diet and using kettle bells at home.  Will review home ex on 4/20.  Breathing is better with ADLs, just not exercise yet.  Blood pressures are stable.   Expected Outcomes Conintue to improve diet and add exercise to  improve cardiorespiratory fitness.      ITP Comments:      ITP Comments      06/03/15 0834           ITP Comments Medical Director-Dr. Fransico Him, MD          Comments:  ITP REVIEW.Pt is making expected progress toward personal goals after completing 7 sessions. Recommend continued exercise and life style modification education including  stress management and relaxation techniques to decrease cardiac risk profile.

## 2015-06-21 ENCOUNTER — Ambulatory Visit: Payer: Medicare Other | Admitting: Thoracic Surgery (Cardiothoracic Vascular Surgery)

## 2015-06-21 ENCOUNTER — Encounter (HOSPITAL_COMMUNITY)
Admission: RE | Admit: 2015-06-21 | Discharge: 2015-06-21 | Disposition: A | Discharge status: Home / Self Care | Payer: Medicare Other | Source: Ambulatory Visit | Attending: Cardiology | Admitting: Cardiology

## 2015-06-21 ENCOUNTER — Telehealth: Payer: Self-pay | Admitting: Cardiology

## 2015-06-21 DIAGNOSIS — Z79899 Other long term (current) drug therapy: Secondary | ICD-10-CM | POA: Diagnosis not present

## 2015-06-21 DIAGNOSIS — I130 Hypertensive heart and chronic kidney disease with heart failure and stage 1 through stage 4 chronic kidney disease, or unspecified chronic kidney disease: Secondary | ICD-10-CM | POA: Diagnosis not present

## 2015-06-21 DIAGNOSIS — I5042 Chronic combined systolic (congestive) and diastolic (congestive) heart failure: Secondary | ICD-10-CM | POA: Diagnosis not present

## 2015-06-21 DIAGNOSIS — K219 Gastro-esophageal reflux disease without esophagitis: Secondary | ICD-10-CM | POA: Diagnosis not present

## 2015-06-21 DIAGNOSIS — E669 Obesity, unspecified: Secondary | ICD-10-CM | POA: Diagnosis not present

## 2015-06-21 DIAGNOSIS — Z7901 Long term (current) use of anticoagulants: Secondary | ICD-10-CM | POA: Diagnosis not present

## 2015-06-21 NOTE — Progress Notes (Signed)
Jose Snyder complained of feeling lightheaded on the walking track today at cardiac rehab. Blood pressure 170/98 exercise stopped. Recheck blood pressure 134/78. Jose Snyder says it is hard for him to do a lot walking at one time. Jose Snyder used a rolling wheelchair for stability. Jose Kicks FNP-C called and notified. Will fax exercise flow sheets to Dr. Jacalyn Lefevre office for review. [name] reported feeling better after resting. We Will have Rob use the arm ergometer for future exercise sessions and not walk on the track for long periods of time due to deconditioning. Exit blood pressure 130/80. Heart rate 73. Will continue to monitor the patient throughout  the program.

## 2015-06-21 NOTE — Telephone Encounter (Signed)
Called by cardiac rehab pt having some dizziness today and with rest it improved.  He was also SOB, usually not walking this much.  BP has been elevated at 170/98 today --Verdis Frederickson is to send BP's to Dr. Stanford Breed and he will address.

## 2015-06-22 ENCOUNTER — Ambulatory Visit (HOSPITAL_COMMUNITY)
Admission: RE | Admit: 2015-06-22 | Discharge: 2015-06-22 | Disposition: A | Discharge status: Home / Self Care | Payer: Medicare Other | Source: Ambulatory Visit | Attending: Thoracic Surgery (Cardiothoracic Vascular Surgery) | Admitting: Thoracic Surgery (Cardiothoracic Vascular Surgery)

## 2015-06-22 ENCOUNTER — Encounter (HOSPITAL_COMMUNITY): Payer: Self-pay

## 2015-06-22 ENCOUNTER — Encounter: Payer: Self-pay | Admitting: Thoracic Surgery (Cardiothoracic Vascular Surgery)

## 2015-06-22 ENCOUNTER — Ambulatory Visit (INDEPENDENT_AMBULATORY_CARE_PROVIDER_SITE_OTHER): Payer: Medicare Other | Admitting: Thoracic Surgery (Cardiothoracic Vascular Surgery)

## 2015-06-22 VITALS — BP 180/90 | HR 82 | Resp 20 | Ht 73.0 in | Wt 268.0 lb

## 2015-06-22 VITALS — BP 183/92 | HR 58

## 2015-06-22 DIAGNOSIS — I700 Atherosclerosis of aorta: Secondary | ICD-10-CM | POA: Diagnosis not present

## 2015-06-22 DIAGNOSIS — R911 Solitary pulmonary nodule: Secondary | ICD-10-CM | POA: Diagnosis not present

## 2015-06-22 DIAGNOSIS — I719 Aortic aneurysm of unspecified site, without rupture: Secondary | ICD-10-CM | POA: Insufficient documentation

## 2015-06-22 DIAGNOSIS — Z9889 Other specified postprocedural states: Secondary | ICD-10-CM

## 2015-06-22 DIAGNOSIS — R918 Other nonspecific abnormal finding of lung field: Secondary | ICD-10-CM | POA: Diagnosis present

## 2015-06-22 DIAGNOSIS — Z8679 Personal history of other diseases of the circulatory system: Secondary | ICD-10-CM

## 2015-06-22 DIAGNOSIS — N289 Disorder of kidney and ureter, unspecified: Secondary | ICD-10-CM

## 2015-06-22 MED ORDER — IOPAMIDOL (ISOVUE-370) INJECTION 76%
INTRAVENOUS | Status: AC
  Administered 2015-06-22: 100 mL
  Filled 2015-06-22: qty 100

## 2015-06-22 MED ORDER — SODIUM BICARBONATE 8.4 % IV SOLN
INTRAVENOUS | Status: DC
  Filled 2015-06-22: qty 500

## 2015-06-22 MED ORDER — SODIUM BICARBONATE BOLUS VIA INFUSION
INTRAVENOUS | Status: AC
  Administered 2015-06-22: 11:00:00 via INTRAVENOUS
  Filled 2015-06-22: qty 1

## 2015-06-22 NOTE — Patient Instructions (Addendum)
Continue all previous medications without any changes at this time  Monitor your blood pressure regularly and discuss results with Dr Stanford Breed so that your medications can be adjusted as necessary  Make every effort to stay physically active, get some type of exercise on a regular basis, and stick to a "heart healthy diet".  The long term benefits for regular exercise and a healthy diet are critically important to your overall health and wellbeing.  The benefits of weight loss should be obvious

## 2015-06-22 NOTE — Progress Notes (Signed)
[address]       [place] [address]             [facility] OFFICE NOTE  Referring Provider is Thompson Grayer, MD  Primary Cardiologist is Lelon Perla, MD PCP is Verline Lema, MD   HPI:  Patient is a 75 year old male who underwent minimally invasive maze procedure on 10/29/2013 for refractory symptoms secondary to recurrent persistent atrial fibrillation that had failed medical therapy and DC cardioversion on multiple occasions.  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  He was last seen here in our office on 11/23/2014 at which time he was maintaining sinus rhythm and doing quite well.  He continues to follow regularly with Dr. Stanford Breed, who saw him most recently on 04/28/2015. The patient states that over the past 6 months he has done very well. He has entered the cardiac rehabilitation program which he is enjoying so far. He has not been successful in losing much weight but he states that his exercise tolerance continues to improve. He has not had any palpitations or other symptoms to suggest a recurrence of atrial fibrillation. He still experiences exertional shortness of breath, but overall he states that he feels much improved in comparison with how he felt prior to his surgery.  He states that his blood pressure has been under fairly good control at home. His blood pressure is checked rectally when he goes to rehabilitation, and it has not been elevated more than 140 or [id number] mmHg systolic. The patient spent all day today at the hospital waiting for his CT scan. He took his blood pressure medication this morning but he was late trying to get to his appointment and states that he got quite worked up about the delays at the hospital.  The  remainder of his review of systems is noncontributory.   Current Outpatient Prescriptions  Medication Sig Dispense Refill  . apixaban (ELIQUIS) 5 MG TABS tablet Take 1 tablet (5 mg total) by mouth 2 (two) times daily. 60 tablet 11  . atorvastatin (LIPITOR) 80 MG tablet Take 1 tablet (80 mg total) by mouth daily with breakfast. 30 tablet 12  . betamethasone dipropionate (DIPROLENE) 0.05 % ointment Apply topically 2 (two) times daily.    . Cholecalciferol (VITAMIN D) 2000 UNITS tablet Take 2,000 Units by mouth daily.    Marland Kitchen docusate sodium (COLACE) 100 MG capsule Take 100 mg by mouth daily as needed for mild constipation.    . fluticasone (FLONASE) 50 MCG/ACT nasal spray Place 1 spray into both nostrils daily as needed for allergies or rhinitis.     . furosemide (LASIX) 40 MG tablet Take 1 tablet (40 mg total) by mouth 2 (two) times daily. If swelling takes an extra dose 60 tablet 6  . ibuprofen (ADVIL,MOTRIN) 200 MG tablet Take 400 mg by mouth daily.    Marland Kitchen lisinopril (PRINIVIL,ZESTRIL) 20 MG tablet Take 1 tablet (20 mg total) by mouth daily with breakfast. 30 tablet 12  . metoprolol succinate (TOPROL-XL) 100 MG 24 hr tablet Take 1 tablet (100 mg total) by mouth daily. 30 tablet 12  . omeprazole (PRILOSEC) 20 MG capsule Take 20 mg by mouth daily.    . potassium chloride SA (K-DUR,KLOR-CON) 20 MEQ tablet Take 1  tablet (20 mEq total) by mouth daily. 30 tablet 11  . temazepam (RESTORIL) 15 MG capsule Take 15 mg by mouth at bedtime as needed.      No current facility-administered medications for this visit.   Facility-Administered Medications Ordered in Other Visits  Medication Dose Route Frequency Provider Last Rate Last Dose  . sodium bicarbonate 75 mEq in dextrose 5 % 500 mL infusion   Intravenous to XRAY Rexene Alberts, MD          Physical Exam:   BP 180/90 mmHg  Pulse 82  Resp 20  Ht 6\' 1"  (1.854 m)  Wt 268 lb (121.564 kg)  BMI 35.37 kg/m2  SpO2  97%  General:  Well-appearing  Chest:   Clear to auscultation  CV:   Regular rate and rhythm without murmur  Incisions:  n/a  Abdomen:  Soft nontender  Extremities:  Warm and well-perfused  Diagnostic Tests:  2 channel telemetry rhythm strip demonstrates normal sinus rhythm   CT ANGIOGRAPHY CHEST WITH CONTRAST  TECHNIQUE: Multidetector CT imaging of the chest was performed using the standard protocol during bolus administration of intravenous contrast. Multiplanar CT image reconstructions and MIPs were obtained to evaluate the vascular anatomy.  CONTRAST: 100 mL Isovue 370  COMPARISON: Prior CTA of the chest 06/22/2014  FINDINGS: Mediastinum: Unremarkable CT appearance of the thyroid gland. No suspicious mediastinal or hilar adenopathy. No soft tissue mediastinal mass. The thoracic esophagus is unremarkable.  Heart/Vascular: 2 vessel arch anatomy. The right brachiocephalic and left common carotid artery share a common origin. No aortic aneurysm or dissection. Stable small penetrating aortic ulcer arising from the distal descending thoracic aorta (5 o'clock position) again measuring 1.3 x 0.7 cm. Normal caliber main pulmonary artery. Left atrial appendage clip. The heart is normal in size. No pericardial effusion.  Lungs/Pleura: Extensive respiratory motion artifact. Evaluation for small pulmonary nodules is limited. The previously noted ground-glass attenuation nodule in the posterior aspect of the right upper lobe is no longer clearly seen in appears to have resolved. There is a background of mild paraseptal emphysema. Dependent atelectasis is present in the periphery bilaterally. Additional pleural parenchymal scarring noted anteriorly in the right middle lobe. Stable small calcified pleural plaque in the inferior-most right middle lobe.  Bones/Soft Tissues: No acute fracture or aggressive appearing lytic or blastic osseous lesion.  Upper Abdomen:  Visualized upper abdominal organs are unremarkable.  Review of the MIP images confirms the above findings.  IMPRESSION: 1. Stable small penetrating aortic ulcer arising from the descending thoracic aorta. 2. Interval resolution of ground-glass attenuation pulmonary nodule from the right upper lobe. This likely represented an infectious/inflammatory process. 3. Additional ancillary findings as above without significant interval change.  Signed,  Criselda Peaches, MD  [facility]  [facility]   Electronically Signed  By: Jacqulynn Cadet M.D.  On: 06/22/2015 14:14   Impression:  Patient is doing very well approximately 18 months status post minimally invasive maze procedure. He is maintaining sinus rhythm. His blood pressure is elevated in the office today, but the patient is quite knowledgeable and feels this is related to the stress he experienced related to all of the delays he went through at the hospital and her having to try to make it to his office appointment this afternoon. He took his regular scheduled blood pressure medications this morning and has remained compliant with medical therapy.  I personally reviewed the follow-up CT angiogram performed earlier today. The previously seen ground-glass opacities  in the lungs have resolved completely.  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    Plan:  We will plan to recheck a follow-up CT angiogram of the aorta in 1 year to follow-up the patient's penetrating atherosclerotic ulcer. I have not recommended any change the patient's blood pressure medications at this time. I've instructed patient to keep tabs on his blood pressure and contact Dr. Jacalyn Lefevre office for instructions if his blood pressure remains elevated.  We discussed the importance of medical treatment for hypertension, particularly as it pertains  to the underlying diagnosis of penetrating atherosclerotic ulcer of the aorta. All of his questions have been addressed.  I spent in excess of 15 minutes during the conduct of this office consultation and >50% of this time involved direct face-to-face encounter with the patient for counseling and/or coordination of their care.   Valentina Gu. Roxy Manns, MD 06/22/2015 5:46 PM

## 2015-06-22 NOTE — Progress Notes (Signed)
Escorted via W/C to front entrance, encouraged to drink extra fluids today

## 2015-06-23 ENCOUNTER — Encounter (HOSPITAL_COMMUNITY)
Admission: RE | Admit: 2015-06-23 | Discharge: 2015-06-23 | Disposition: A | Discharge status: Home / Self Care | Payer: Medicare Other | Source: Ambulatory Visit | Attending: Cardiology | Admitting: Cardiology

## 2015-06-23 DIAGNOSIS — I130 Hypertensive heart and chronic kidney disease with heart failure and stage 1 through stage 4 chronic kidney disease, or unspecified chronic kidney disease: Secondary | ICD-10-CM | POA: Diagnosis not present

## 2015-06-23 DIAGNOSIS — Z7901 Long term (current) use of anticoagulants: Secondary | ICD-10-CM | POA: Diagnosis not present

## 2015-06-23 DIAGNOSIS — E669 Obesity, unspecified: Secondary | ICD-10-CM | POA: Diagnosis not present

## 2015-06-23 DIAGNOSIS — K219 Gastro-esophageal reflux disease without esophagitis: Secondary | ICD-10-CM | POA: Diagnosis not present

## 2015-06-23 DIAGNOSIS — I5042 Chronic combined systolic (congestive) and diastolic (congestive) heart failure: Secondary | ICD-10-CM | POA: Diagnosis not present

## 2015-06-23 DIAGNOSIS — Z79899 Other long term (current) drug therapy: Secondary | ICD-10-CM | POA: Diagnosis not present

## 2015-06-23 NOTE — Telephone Encounter (Signed)
bp information received from cardiac rehab. Will show dr Stanford Breed when he is in the office thursday

## 2015-06-25 ENCOUNTER — Encounter (HOSPITAL_COMMUNITY)
Admission: RE | Admit: 2015-06-25 | Discharge: 2015-06-25 | Disposition: A | Discharge status: Home / Self Care | Payer: Medicare Other | Source: Ambulatory Visit | Attending: Cardiology | Admitting: Cardiology

## 2015-06-25 DIAGNOSIS — Z7901 Long term (current) use of anticoagulants: Secondary | ICD-10-CM | POA: Diagnosis not present

## 2015-06-25 DIAGNOSIS — E669 Obesity, unspecified: Secondary | ICD-10-CM | POA: Diagnosis not present

## 2015-06-25 DIAGNOSIS — Z79899 Other long term (current) drug therapy: Secondary | ICD-10-CM | POA: Diagnosis not present

## 2015-06-25 DIAGNOSIS — I130 Hypertensive heart and chronic kidney disease with heart failure and stage 1 through stage 4 chronic kidney disease, or unspecified chronic kidney disease: Secondary | ICD-10-CM | POA: Diagnosis not present

## 2015-06-25 DIAGNOSIS — I5042 Chronic combined systolic (congestive) and diastolic (congestive) heart failure: Secondary | ICD-10-CM

## 2015-06-25 DIAGNOSIS — K219 Gastro-esophageal reflux disease without esophagitis: Secondary | ICD-10-CM | POA: Diagnosis not present

## 2015-06-28 ENCOUNTER — Encounter (HOSPITAL_COMMUNITY)
Admission: RE | Admit: 2015-06-28 | Discharge: 2015-06-28 | Disposition: A | Discharge status: Home / Self Care | Payer: Medicare Other | Source: Ambulatory Visit | Attending: Cardiology | Admitting: Cardiology

## 2015-06-28 DIAGNOSIS — Z79899 Other long term (current) drug therapy: Secondary | ICD-10-CM | POA: Diagnosis not present

## 2015-06-28 DIAGNOSIS — C61 Malignant neoplasm of prostate: Secondary | ICD-10-CM | POA: Diagnosis not present

## 2015-06-28 DIAGNOSIS — I4891 Unspecified atrial fibrillation: Secondary | ICD-10-CM | POA: Insufficient documentation

## 2015-06-28 DIAGNOSIS — Z7901 Long term (current) use of anticoagulants: Secondary | ICD-10-CM | POA: Insufficient documentation

## 2015-06-28 DIAGNOSIS — I2510 Atherosclerotic heart disease of native coronary artery without angina pectoris: Secondary | ICD-10-CM | POA: Insufficient documentation

## 2015-06-28 DIAGNOSIS — I130 Hypertensive heart and chronic kidney disease with heart failure and stage 1 through stage 4 chronic kidney disease, or unspecified chronic kidney disease: Secondary | ICD-10-CM | POA: Insufficient documentation

## 2015-06-28 DIAGNOSIS — N189 Chronic kidney disease, unspecified: Secondary | ICD-10-CM | POA: Insufficient documentation

## 2015-06-28 DIAGNOSIS — I5042 Chronic combined systolic (congestive) and diastolic (congestive) heart failure: Secondary | ICD-10-CM

## 2015-06-28 DIAGNOSIS — E785 Hyperlipidemia, unspecified: Secondary | ICD-10-CM | POA: Insufficient documentation

## 2015-06-28 DIAGNOSIS — K219 Gastro-esophageal reflux disease without esophagitis: Secondary | ICD-10-CM | POA: Diagnosis not present

## 2015-06-28 DIAGNOSIS — I428 Other cardiomyopathies: Secondary | ICD-10-CM | POA: Diagnosis not present

## 2015-06-28 DIAGNOSIS — E669 Obesity, unspecified: Secondary | ICD-10-CM | POA: Insufficient documentation

## 2015-06-28 DIAGNOSIS — Z6835 Body mass index (BMI) 35.0-35.9, adult: Secondary | ICD-10-CM | POA: Diagnosis not present

## 2015-06-30 ENCOUNTER — Encounter (HOSPITAL_COMMUNITY)
Admission: RE | Admit: 2015-06-30 | Discharge: 2015-06-30 | Disposition: A | Discharge status: Home / Self Care | Payer: Medicare Other | Source: Ambulatory Visit | Attending: Cardiology | Admitting: Cardiology

## 2015-06-30 DIAGNOSIS — K219 Gastro-esophageal reflux disease without esophagitis: Secondary | ICD-10-CM | POA: Diagnosis not present

## 2015-06-30 DIAGNOSIS — I5042 Chronic combined systolic (congestive) and diastolic (congestive) heart failure: Secondary | ICD-10-CM | POA: Diagnosis not present

## 2015-06-30 DIAGNOSIS — I130 Hypertensive heart and chronic kidney disease with heart failure and stage 1 through stage 4 chronic kidney disease, or unspecified chronic kidney disease: Secondary | ICD-10-CM | POA: Diagnosis not present

## 2015-06-30 DIAGNOSIS — E669 Obesity, unspecified: Secondary | ICD-10-CM | POA: Diagnosis not present

## 2015-06-30 DIAGNOSIS — Z7901 Long term (current) use of anticoagulants: Secondary | ICD-10-CM | POA: Diagnosis not present

## 2015-06-30 DIAGNOSIS — Z79899 Other long term (current) drug therapy: Secondary | ICD-10-CM | POA: Diagnosis not present

## 2015-07-02 ENCOUNTER — Encounter (HOSPITAL_COMMUNITY)
Admission: RE | Admit: 2015-07-02 | Discharge: 2015-07-02 | Disposition: A | Discharge status: Home / Self Care | Payer: Medicare Other | Source: Ambulatory Visit | Attending: Cardiology | Admitting: Cardiology

## 2015-07-02 DIAGNOSIS — I130 Hypertensive heart and chronic kidney disease with heart failure and stage 1 through stage 4 chronic kidney disease, or unspecified chronic kidney disease: Secondary | ICD-10-CM | POA: Diagnosis not present

## 2015-07-02 DIAGNOSIS — Z79899 Other long term (current) drug therapy: Secondary | ICD-10-CM | POA: Diagnosis not present

## 2015-07-02 DIAGNOSIS — Z7901 Long term (current) use of anticoagulants: Secondary | ICD-10-CM | POA: Diagnosis not present

## 2015-07-02 DIAGNOSIS — I5042 Chronic combined systolic (congestive) and diastolic (congestive) heart failure: Secondary | ICD-10-CM

## 2015-07-02 DIAGNOSIS — K219 Gastro-esophageal reflux disease without esophagitis: Secondary | ICD-10-CM | POA: Diagnosis not present

## 2015-07-02 DIAGNOSIS — E669 Obesity, unspecified: Secondary | ICD-10-CM | POA: Diagnosis not present

## 2015-07-05 ENCOUNTER — Encounter (HOSPITAL_COMMUNITY)
Admission: RE | Admit: 2015-07-05 | Discharge: 2015-07-05 | Disposition: A | Discharge status: Home / Self Care | Payer: Medicare Other | Source: Ambulatory Visit | Attending: Cardiology | Admitting: Cardiology

## 2015-07-05 DIAGNOSIS — Z7901 Long term (current) use of anticoagulants: Secondary | ICD-10-CM | POA: Diagnosis not present

## 2015-07-05 DIAGNOSIS — K219 Gastro-esophageal reflux disease without esophagitis: Secondary | ICD-10-CM | POA: Diagnosis not present

## 2015-07-05 DIAGNOSIS — Z79899 Other long term (current) drug therapy: Secondary | ICD-10-CM | POA: Diagnosis not present

## 2015-07-05 DIAGNOSIS — E669 Obesity, unspecified: Secondary | ICD-10-CM | POA: Diagnosis not present

## 2015-07-05 DIAGNOSIS — I5042 Chronic combined systolic (congestive) and diastolic (congestive) heart failure: Secondary | ICD-10-CM | POA: Diagnosis not present

## 2015-07-05 DIAGNOSIS — I130 Hypertensive heart and chronic kidney disease with heart failure and stage 1 through stage 4 chronic kidney disease, or unspecified chronic kidney disease: Secondary | ICD-10-CM | POA: Diagnosis not present

## 2015-07-07 ENCOUNTER — Encounter (HOSPITAL_COMMUNITY)
Admission: RE | Admit: 2015-07-07 | Discharge: 2015-07-07 | Disposition: A | Discharge status: Home / Self Care | Payer: Medicare Other | Source: Ambulatory Visit | Attending: Cardiology | Admitting: Cardiology

## 2015-07-07 DIAGNOSIS — K219 Gastro-esophageal reflux disease without esophagitis: Secondary | ICD-10-CM | POA: Diagnosis not present

## 2015-07-07 DIAGNOSIS — Z7901 Long term (current) use of anticoagulants: Secondary | ICD-10-CM | POA: Diagnosis not present

## 2015-07-07 DIAGNOSIS — I5042 Chronic combined systolic (congestive) and diastolic (congestive) heart failure: Secondary | ICD-10-CM

## 2015-07-07 DIAGNOSIS — E669 Obesity, unspecified: Secondary | ICD-10-CM | POA: Diagnosis not present

## 2015-07-07 DIAGNOSIS — Z79899 Other long term (current) drug therapy: Secondary | ICD-10-CM | POA: Diagnosis not present

## 2015-07-07 DIAGNOSIS — I130 Hypertensive heart and chronic kidney disease with heart failure and stage 1 through stage 4 chronic kidney disease, or unspecified chronic kidney disease: Secondary | ICD-10-CM | POA: Diagnosis not present

## 2015-07-09 ENCOUNTER — Telehealth: Payer: Self-pay | Admitting: Cardiology

## 2015-07-09 ENCOUNTER — Encounter (HOSPITAL_COMMUNITY)
Admission: RE | Admit: 2015-07-09 | Discharge: 2015-07-09 | Disposition: A | Discharge status: Home / Self Care | Payer: Medicare Other | Source: Ambulatory Visit | Attending: Cardiology | Admitting: Cardiology

## 2015-07-09 DIAGNOSIS — K219 Gastro-esophageal reflux disease without esophagitis: Secondary | ICD-10-CM | POA: Diagnosis not present

## 2015-07-09 DIAGNOSIS — E669 Obesity, unspecified: Secondary | ICD-10-CM | POA: Diagnosis not present

## 2015-07-09 DIAGNOSIS — Z79899 Other long term (current) drug therapy: Secondary | ICD-10-CM | POA: Diagnosis not present

## 2015-07-09 DIAGNOSIS — Z7901 Long term (current) use of anticoagulants: Secondary | ICD-10-CM | POA: Diagnosis not present

## 2015-07-09 DIAGNOSIS — I5042 Chronic combined systolic (congestive) and diastolic (congestive) heart failure: Secondary | ICD-10-CM | POA: Diagnosis not present

## 2015-07-09 DIAGNOSIS — I130 Hypertensive heart and chronic kidney disease with heart failure and stage 1 through stage 4 chronic kidney disease, or unspecified chronic kidney disease: Secondary | ICD-10-CM | POA: Diagnosis not present

## 2015-07-09 NOTE — Telephone Encounter (Signed)
New message      The tech at cardiac rehab had taken the blood pressure while the pt was working out it was elevated, she is faxing over her note to you, before the pt left anoth blood pressure reading was done the blood pressure was fine then. No other detail provided.

## 2015-07-09 NOTE — Telephone Encounter (Signed)
Need bps Kirk Ruths

## 2015-07-09 NOTE — Telephone Encounter (Signed)
Did not see information on fax. Did send Verdis Frederickson with cardiac rehab a message to refax Will forward to Dr Stanford Breed and Luisa Dago RN

## 2015-07-09 NOTE — Progress Notes (Signed)
Jose Snyder's blood pressure was noted at 176/96 this morning on the nustep this morning at cardiac rehab. Patient had increased his workload on the recumbent bike to a level 4.0. Workload was decreased. Re check blood pressure 172/80 then 122/80. Jose Snyder said he took his medications as prescribed today. Dr Jacalyn Lefevre office called and notified about today's exertional blood pressures. Will fax today's  exercise flow sheets to Dr. Jacalyn Lefevre office for review.

## 2015-07-12 ENCOUNTER — Encounter (HOSPITAL_COMMUNITY)
Admission: RE | Admit: 2015-07-12 | Discharge: 2015-07-12 | Disposition: A | Discharge status: Home / Self Care | Payer: Medicare Other | Source: Ambulatory Visit | Attending: Cardiology | Admitting: Cardiology

## 2015-07-12 DIAGNOSIS — Z7901 Long term (current) use of anticoagulants: Secondary | ICD-10-CM | POA: Diagnosis not present

## 2015-07-12 DIAGNOSIS — E669 Obesity, unspecified: Secondary | ICD-10-CM | POA: Diagnosis not present

## 2015-07-12 DIAGNOSIS — K219 Gastro-esophageal reflux disease without esophagitis: Secondary | ICD-10-CM | POA: Diagnosis not present

## 2015-07-12 DIAGNOSIS — I5042 Chronic combined systolic (congestive) and diastolic (congestive) heart failure: Secondary | ICD-10-CM | POA: Diagnosis not present

## 2015-07-12 DIAGNOSIS — I130 Hypertensive heart and chronic kidney disease with heart failure and stage 1 through stage 4 chronic kidney disease, or unspecified chronic kidney disease: Secondary | ICD-10-CM | POA: Diagnosis not present

## 2015-07-12 DIAGNOSIS — Z79899 Other long term (current) drug therapy: Secondary | ICD-10-CM | POA: Diagnosis not present

## 2015-07-14 ENCOUNTER — Encounter (HOSPITAL_COMMUNITY)
Admission: RE | Admit: 2015-07-14 | Discharge: 2015-07-14 | Disposition: A | Discharge status: Home / Self Care | Payer: Medicare Other | Source: Ambulatory Visit | Attending: Cardiology | Admitting: Cardiology

## 2015-07-14 DIAGNOSIS — I5042 Chronic combined systolic (congestive) and diastolic (congestive) heart failure: Secondary | ICD-10-CM

## 2015-07-14 DIAGNOSIS — I130 Hypertensive heart and chronic kidney disease with heart failure and stage 1 through stage 4 chronic kidney disease, or unspecified chronic kidney disease: Secondary | ICD-10-CM | POA: Diagnosis not present

## 2015-07-14 DIAGNOSIS — E669 Obesity, unspecified: Secondary | ICD-10-CM | POA: Diagnosis not present

## 2015-07-14 DIAGNOSIS — K219 Gastro-esophageal reflux disease without esophagitis: Secondary | ICD-10-CM | POA: Diagnosis not present

## 2015-07-14 DIAGNOSIS — Z79899 Other long term (current) drug therapy: Secondary | ICD-10-CM | POA: Diagnosis not present

## 2015-07-14 DIAGNOSIS — Z7901 Long term (current) use of anticoagulants: Secondary | ICD-10-CM | POA: Diagnosis not present

## 2015-07-15 NOTE — Telephone Encounter (Signed)
Blood pressures reviewed by dr Stanford Breed, no change in medications at this time.

## 2015-07-16 ENCOUNTER — Encounter (HOSPITAL_COMMUNITY)
Admission: RE | Admit: 2015-07-16 | Discharge: 2015-07-16 | Disposition: A | Discharge status: Home / Self Care | Payer: Medicare Other | Source: Ambulatory Visit | Attending: Cardiology | Admitting: Cardiology

## 2015-07-16 DIAGNOSIS — I130 Hypertensive heart and chronic kidney disease with heart failure and stage 1 through stage 4 chronic kidney disease, or unspecified chronic kidney disease: Secondary | ICD-10-CM | POA: Diagnosis not present

## 2015-07-16 DIAGNOSIS — Z7901 Long term (current) use of anticoagulants: Secondary | ICD-10-CM | POA: Diagnosis not present

## 2015-07-16 DIAGNOSIS — K219 Gastro-esophageal reflux disease without esophagitis: Secondary | ICD-10-CM | POA: Diagnosis not present

## 2015-07-16 DIAGNOSIS — Z79899 Other long term (current) drug therapy: Secondary | ICD-10-CM | POA: Diagnosis not present

## 2015-07-16 DIAGNOSIS — I5042 Chronic combined systolic (congestive) and diastolic (congestive) heart failure: Secondary | ICD-10-CM

## 2015-07-16 DIAGNOSIS — E669 Obesity, unspecified: Secondary | ICD-10-CM | POA: Diagnosis not present

## 2015-07-19 ENCOUNTER — Encounter (HOSPITAL_COMMUNITY)
Admission: RE | Admit: 2015-07-19 | Discharge: 2015-07-19 | Disposition: A | Discharge status: Home / Self Care | Payer: Medicare Other | Source: Ambulatory Visit | Attending: Cardiology | Admitting: Cardiology

## 2015-07-19 DIAGNOSIS — I130 Hypertensive heart and chronic kidney disease with heart failure and stage 1 through stage 4 chronic kidney disease, or unspecified chronic kidney disease: Secondary | ICD-10-CM | POA: Diagnosis not present

## 2015-07-19 DIAGNOSIS — Z79899 Other long term (current) drug therapy: Secondary | ICD-10-CM | POA: Diagnosis not present

## 2015-07-19 DIAGNOSIS — Z7901 Long term (current) use of anticoagulants: Secondary | ICD-10-CM | POA: Diagnosis not present

## 2015-07-19 DIAGNOSIS — E669 Obesity, unspecified: Secondary | ICD-10-CM | POA: Diagnosis not present

## 2015-07-19 DIAGNOSIS — I5042 Chronic combined systolic (congestive) and diastolic (congestive) heart failure: Secondary | ICD-10-CM | POA: Diagnosis not present

## 2015-07-19 DIAGNOSIS — K219 Gastro-esophageal reflux disease without esophagitis: Secondary | ICD-10-CM | POA: Diagnosis not present

## 2015-07-20 NOTE — Progress Notes (Signed)
Cardiac Individual Treatment Plan  Patient Details  Name: Jose Snyder MRN: [id number] Date of Birth: 11-Jul-1940 Referring Provider:        CARDIAC REHAB PHASE II ORIENTATION from 06/03/2015 in [place]   Referring Provider  Kirk Ruths MD      Initial Encounter Date:       CARDIAC REHAB PHASE II ORIENTATION from 06/03/2015 in [place] [facility]   Date  06/03/15   Referring Provider  Kirk Ruths MD      Visit Diagnosis: Chronic combined systolic and diastolic congestive heart failure ([place])  Patient's Home Medications on Admission:  Current outpatient prescriptions:  .  apixaban (ELIQUIS) 5 MG TABS tablet, Take 1 tablet (5 mg total) by mouth 2 (two) times daily., Disp: 60 tablet, Rfl: 11 .  atorvastatin (LIPITOR) 80 MG tablet, Take 1 tablet (80 mg total) by mouth daily with breakfast., Disp: 30 tablet, Rfl: 12 .  betamethasone dipropionate (DIPROLENE) 0.05 % ointment, Apply topically 2 (two) times daily., Disp: , Rfl:  .  Cholecalciferol (VITAMIN D) 2000 UNITS tablet, Take 2,000 Units by mouth daily., Disp: , Rfl:  .  docusate sodium (COLACE) 100 MG capsule, Take 100 mg by mouth daily as needed for mild constipation., Disp: , Rfl:  .  fluticasone (FLONASE) 50 MCG/ACT nasal spray, Place 1 spray into both nostrils daily as needed for allergies or rhinitis. , Disp: , Rfl:  .  furosemide (LASIX) 40 MG tablet, Take 1 tablet (40 mg total) by mouth 2 (two) times daily. If swelling takes an extra dose, Disp: 60 tablet, Rfl: 6 .  ibuprofen (ADVIL,MOTRIN) 200 MG tablet, Take 400 mg by mouth daily., Disp: , Rfl:  .  lisinopril (PRINIVIL,ZESTRIL) 20 MG tablet, Take 1 tablet (20 mg total) by mouth daily with breakfast., Disp: 30 tablet, Rfl: 12 .  metoprolol succinate (TOPROL-XL) 100 MG 24 hr tablet, Take 1 tablet (100 mg total) by mouth daily., Disp: 30 tablet, Rfl: 12 .  omeprazole (PRILOSEC) 20 MG capsule, Take 20 mg by mouth daily.,  Disp: , Rfl:  .  potassium chloride SA (K-DUR,KLOR-CON) 20 MEQ tablet, Take 1 tablet (20 mEq total) by mouth daily., Disp: 30 tablet, Rfl: 11 .  temazepam (RESTORIL) 15 MG capsule, Take 15 mg by mouth at bedtime as needed. , Disp: , Rfl:   Past Medical History: Past Medical History  Diagnosis Date  . Hypertension   . Obesity   . GERD (gastroesophageal reflux disease)   . Chronic combined systolic and diastolic heart failure ([place]) 06/26/2013    a.  Echo (06/25/13):  EF 20-25%, diff HK, restrictive physio, mild MR, mod to severe LAE, mild RVE, mildly reduced RVSF, mod to severe RAE  . NICM (nonischemic cardiomyopathy) with EF 20-25% 06/26/2013  . Atrial Fibrillation  06/25/2013  . Dyslipidemia 07/15/2013  . CAD (coronary artery disease), native coronary artery 06/28/2013    a. LHC (06/27/13):  Mid LAD 40%, mid D1 75%, mid CFX 50-60%, mid RCA 50%, EF 20%, LVEDP 24 mmHg.  Marland Kitchen Penetrating atherosclerotic ulcer of aorta ([place]) 10/22/2013    Small penetrating ulcer of descending thoracic aorta discovered on routine CTA  . Shortness of breath     seen by [facility], & low energy   . CHF (congestive heart failure) ([place])   . Dysrhythmia     a-fib  . Anxiety   . Arthritis     R hand- OA  . Prostate cancer ([employer])  watchful waiting, no treatment so far  . S/P Maze operation for atrial fibrillation 10/29/2013    Complete bilateral atrial lesion set using cryothermy and bipolar radiofrequency ablation with clipping of LA appendage via right mini thoracotomy  . Chronic kidney disease   . Incidental pulmonary nodule 06/22/2014    Seen on CT angiogram of chest    Tobacco Use: History  Smoking status  . Never Smoker   Smokeless tobacco  . Never Used    Comment: 06/25/2013 "smoked very light; years and years ago"    Labs:     Recent Review Flowsheet Data    Labs for ITP Cardiac and Pulmonary Rehab Latest Ref Rng 10/30/2013 10/30/2013 10/30/2013 10/30/2013 05/20/2015   Cholestrol 125 - 200 mg/dL - - - - 117(L)    LDLCALC <130 mg/dL - - - - 32   HDL >=40 mg/dL - - - - 43   Trlycerides <150 mg/dL - - - - 211(H)   PHART 7.350 - 7.450 7.406 7.425 - 7.448 -   PCO2ART 35.0 - 45.0 mmHg 32.8(L) 30.7(L) - 29.4(L) -   HCO3 20.0 - 24.0 mEq/L 20.8 20.2 - 20.5 -   TCO2 0 - 100 mmol/L 22 21 19 21  -   ACIDBASEDEF 0.0 - 2.0 mmol/L 3.0(H) 3.0(H) - 3.0(H) -   O2SAT - 90.0 88.0 - 95.0 -      Capillary Blood Glucose: Lab Results  Component Value Date   GLUCAP 121* 06/07/2015   GLUCAP 139* 10/31/2013   GLUCAP 108* 10/31/2013   GLUCAP 110* 10/30/2013   GLUCAP 136* 10/30/2013     Exercise Target Goals:    Exercise Program Goal: Individual exercise prescription set with THRR, safety & activity barriers. Participant demonstrates ability to understand and report RPE using BORG scale, to self-measure pulse accurately, and to acknowledge the importance of the exercise prescription.  Exercise Prescription Goal: Starting with aerobic activity 30 plus minutes a day, 3 days per week for initial exercise prescription. Provide home exercise prescription and guidelines that participant acknowledges understanding prior to discharge.  Activity Barriers & Risk Stratification:     Activity Barriers & Cardiac Risk Stratification - 06/03/15 0834    Activity Barriers & Cardiac Risk Stratification   Activity Barriers Deconditioning;Muscular Weakness;Balance Concerns;Left Knee Replacement;Arthritis;Shortness of Breath   Cardiac Risk Stratification High      6 Minute Walk:     6 Minute Walk      06/03/15 0800       6 Minute Walk   Phase Initial     Distance 688 feet     Walk Time 5.23 minutes     # of Rest Breaks 1     MPH 1.49     METS 1.36     RPE 13     Perceived Dyspnea  2  95%     VO2 Peak 4.79     Symptoms Yes (comment)     Comments Shortness of breath     Resting HR 84 bpm     Resting BP 136/74 mmHg     Max Ex. HR 113 bpm     Max Ex. BP 140/80 mmHg     2 Minute Post BP 122/62 mmHg         Initial Exercise Prescription:     Initial Exercise Prescription - 06/10/15 1300    Date of Initial Exercise RX and Referring Provider   Date 06/03/15   Referring Provider Kirk Ruths MD   [place]  Level 1   RPM 30   Watts 5   Minutes 10   METs 1.4   T5 Nustep   Level 1   Minutes 10   METs 1.5   Track   Laps 5   Minutes 10   METs 0.84   Prescription Details   Frequency (times per week) 3   Intensity   THRR 40-80% of Max Heartrate 58-117   Ratings of Perceived Exertion 11-13   Perceived Dyspnea 0-4   Progression   Progression Continue to progress workloads to maintain intensity without signs/symptoms of physical distress.   Resistance Training   Training Prescription Yes   Weight 2 lbs   Reps 10-12      Perform Capillary Blood Glucose checks as needed.  Exercise Prescription Changes:      Exercise Prescription Changes      06/15/15 1000 06/18/15 1000 06/29/15 0800 07/16/15 1300     Exercise Review   Progression Yes Yes Yes Yes    Response to Exercise   Blood Pressure (Admit) 160/82 mmHg 160/82 mmHg 104/60 mmHg 148/80 mmHg    Blood Pressure (Exercise) 182/90 mmHg 182/90 mmHg 128/74 mmHg 142/82 mmHg    Blood Pressure (Exit) 110/64 mmHg 110/64 mmHg 140/84 mmHg 146/80 mmHg    Heart Rate (Admit) 80 bpm 80 bpm 82 bpm 87 bpm    Heart Rate (Exercise) 114 bpm 114 bpm 105 bpm 103 bpm    Heart Rate (Exit) 61 bpm 61 bpm 72 bpm 86 bpm    Oxygen Saturation (Exercise) 97 % 97 %      Oxygen Saturation (Exit)    96 %    Rating of Perceived Exertion (Exercise) 13 13 12 11     Symptoms none none walking fatigue increased none    Comments  Home Exercise Given 06/18/15 Home Exercise Given 06/18/15 Home Exercise Given 06/18/15    Duration Progress to 30 minutes of continuous aerobic without signs/symptoms of physical distress Progress to 30 minutes of continuous aerobic without signs/symptoms of physical distress Progress to 30 minutes of continuous aerobic without  signs/symptoms of physical distress Progress to 30 minutes of continuous aerobic without signs/symptoms of physical distress    Intensity THRR unchanged THRR unchanged THRR unchanged THRR unchanged    Progression   Progression Continue to progress workloads to maintain intensity without signs/symptoms of physical distress. Continue to progress workloads to maintain intensity without signs/symptoms of physical distress. Continue to progress workloads to maintain intensity without signs/symptoms of physical distress. Continue to progress workloads to maintain intensity without signs/symptoms of physical distress.    Average METs 2.3 2.3 2.6 3.4    Resistance Training   Training Prescription Yes Yes Yes Yes    Weight 2 lbs 2 lbs 2 lbs 4 lbs    Reps 10-12 10-12 10-12 10-12    Interval Training   Interval Training No No No No    Recumbant Bike   Level 2 2 3 4     Watts 24 24      Minutes 10 10 10 10     METs 2.6 2.6 3.3 3.8    Arm Ergometer   Level   3 3    Watts   10 14    Minutes   10 10    METs   1.42 2.08    T5 Nustep   Level 3 3 3 3     Minutes 10 10 10 10     METs 2.2 2.2 3.1 2.7    Track  Laps 6 6      Minutes 10 10      METs 2 2      Home Exercise Plan   Plans to continue exercise at  Home  Walking and [place]  Walking and Kettlebells    Frequency  Add 2 additional days to program exercise sessions. Add 2 additional days to program exercise sessions. Add 2 additional days to program exercise sessions.       Exercise Comments:      Exercise Comments      06/15/15 1021 06/29/15 0846 07/16/15 1340       Exercise Comments Pt is tolerating exercise well and continues to make progress.  He has had some elevated blood pressures.  Will continue to monitor. Pt has switched from walking to arm crank due to fatigue from walking. Pt feels balance and breathing are improving. Make steady progress with exercise.        Discharge Exercise  Prescription (Final Exercise Prescription Changes):     Exercise Prescription Changes - 07/16/15 1300    Exercise Review   Progression Yes   Response to Exercise   Blood Pressure (Admit) 148/80 mmHg   Blood Pressure (Exercise) 142/82 mmHg   Blood Pressure (Exit) 146/80 mmHg   Heart Rate (Admit) 87 bpm   Heart Rate (Exercise) 103 bpm   Heart Rate (Exit) 86 bpm   Oxygen Saturation (Exit) 96 %   Rating of Perceived Exertion (Exercise) 11   Symptoms none   Comments Home Exercise Given 06/18/15   Duration Progress to 30 minutes of continuous aerobic without signs/symptoms of physical distress   Intensity THRR unchanged   Progression   Progression Continue to progress workloads to maintain intensity without signs/symptoms of physical distress.   Average METs 3.4   Resistance Training   Training Prescription Yes   Weight 4 lbs   Reps 10-12   Interval Training   Interval Training No   Recumbant Bike   Level 4   Minutes 10   METs 3.8   Arm Ergometer   Level 3   Watts 14   Minutes 10   METs 2.08   T5 Nustep   Level 3   Minutes 10   METs 2.7   Home Exercise Plan   Plans to continue exercise at Home  Walking and Kettlebells   Frequency Add 2 additional days to program exercise sessions.      Nutrition:  Target Goals: Understanding of nutrition guidelines, daily intake of sodium 1500mg , cholesterol 200mg , calories 30% from fat and 7% or less from saturated fats, daily to have 5 or more servings of fruits and vegetables.  Biometrics:     Pre Biometrics - 06/03/15 1213    Pre Biometrics   Height 6\' 1"  (1.854 m)   Weight 268 lb 8.3 oz (121.8 kg)   Waist Circumference 46 inches   Hip Circumference 46 inches   Waist to Hip Ratio 1 %   BMI (Calculated) 35.5   Triceps Skinfold 25 mm   % Body Fat 34.9 %   Grip Strength 30 kg   Flexibility 0 in   Single Leg Stand 2.21 seconds       Nutrition Therapy Plan and Nutrition Goals:     Nutrition Therapy & Goals -  06/14/15 1100    Nutrition Therapy   Diet Therapeutic Lifestyle Changes   Personal Nutrition Goals   Personal Goal #1 0.5-2 lb weight loss per week to  a goal wt loss of 6-24 lb at graduation from [facility], educate and counsel regarding individualized specific dietary modifications aiming towards targeted core components such as weight, hypertension, lipid management, diabetes, heart failure and other comorbidities.;Nutrition handout(s) given to patient.  Handouts for Nutrition I & II classes, 5 day menu ideas, and a list of meal preparation companys that cater to special dietary needs   Expected Outcomes Short Term Goal: Understand basic principles of dietary content, such as calories, fat, sodium, cholesterol and nutrients.;Long Term Goal: Adherence to prescribed nutrition plan.      Nutrition Discharge: Nutrition Scores:     Nutrition Assessments - 06/03/15 1457    MEDFICTS Scores   Pre Score 78      Nutrition Goals Re-Evaluation:   Psychosocial: Target Goals: Acknowledge presence or absence of depression, maximize coping skills, provide positive support system. Participant is able to verbalize types and ability to use techniques and skills needed for reducing stress and depression.  Initial Review & Psychosocial Screening:     Initial Psych Review & Screening - 06/07/15 1146    Family Dynamics   Comments mr Giannelli's wife passed away in 06-17-2012      Quality of Life Scores:     Quality of Life - 06/03/15 1214    Quality of Life Scores   Health/Function Pre 25.43 %   Socioeconomic Pre 28.21 %   Psych/Spiritual Pre 28.29 %   Family Pre 27 %   GLOBAL Pre 26.85 %      PHQ-9:     Recent Review Flowsheet Data    Depression screen [facility] 2/9 06/07/2015   Decreased Interest 0   Down, Depressed, Hopeless 0   PHQ - 2 Score 0      Psychosocial Evaluation and Intervention:   Psychosocial Re-Evaluation:     Psychosocial  Re-Evaluation      06/18/15 0936 07/20/15 1019         Psychosocial Re-Evaluation   Interventions Encouraged to attend Cardiac Rehabilitation for the exercise;Stress management education Encouraged to attend Cardiac Rehabilitation for the exercise;Stress management education      Comments  Dimitar is enjoying participating in cardiac rehab and has been able have social intereaction with his firends       Continued Psychosocial Services Needed No No         Vocational Rehabilitation: Provide vocational rehab assistance to qualifying candidates.   Vocational Rehab Evaluation & Intervention:     Vocational Rehab - 06/04/15 0905    Initial Vocational Rehab Evaluation & Intervention   Assessment shows need for Vocational Rehabilitation No  Mr Egler is retired and does not need vocational rehab      Education: Education Goals: Education classes will be provided on a weekly basis, covering required topics. Participant will state understanding/return demonstration of topics presented.  Learning Barriers/Preferences:     Learning Barriers/Preferences - 06/03/15 1212    Learning Barriers/Preferences   Learning Barriers Sight;Hearing  reading glasses, bilateral hearing loss, no hearing aids   Learning Preferences Individual Instruction;Verbal Instruction;Written Material      Education Topics: Count Your Pulse:  -Group instruction provided by verbal instruction, demonstration, patient participation and written materials to support subject.  Instructors address importance of being able to find your pulse and how to count your pulse when at home without a heart monitor.  Patients get hands on experience counting their pulse with staff help and individually.   Heart Attack,  Angina, and Risk Factor Modification:  -Group instruction provided by verbal instruction, video, and written materials to support subject.  Instructors address signs and symptoms of angina and heart attacks.    Also  discuss risk factors for heart disease and how to make changes to improve heart health risk factors.   Functional Fitness:  -Group instruction provided by verbal instruction, demonstration, patient participation, and written materials to support subject.  Instructors address safety measures for doing things around the house.  Discuss how to get up and down off the floor, how to pick things up properly, how to safely get out of a chair without assistance, and balance training.      CARDIAC REHAB PHASE II EXERCISE from 07/16/2015 in [place]   Date  07/16/15   Educator  [place]   Instruction Review Code  2- meets goals/outcomes      Meditation and Mindfulness:  -Group instruction provided by verbal instruction, patient participation, and written materials to support subject.  Instructor addresses importance of mindfulness and meditation practice to help reduce stress and improve awareness.  Instructor also leads participants through a meditation exercise.    Stretching for Flexibility and Mobility:  -Group instruction provided by verbal instruction, patient participation, and written materials to support subject.  Instructors lead participants through series of stretches that are designed to increase flexibility thus improving mobility.  These stretches are additional exercise for major muscle groups that are typically performed during regular warm up and cool down.      CARDIAC REHAB PHASE II EXERCISE from 07/16/2015 in [facility]   Date  06/11/15   Educator  Alberteen Sam, MA, ACSM RCEP   Instruction Review Code  2- meets goals/outcomes      Hands Only CPR Anytime:  -Group instruction provided by verbal instruction, video, patient participation and written materials to support subject.  Instructors co-teach with AHA video for hands only CPR.  Participants get hands on experience with mannequins.   Nutrition I class:  Heart Healthy Eating:  -Group instruction provided by PowerPoint slides, verbal discussion, and written materials to support subject matter. The instructor gives an explanation and review of the Therapeutic Lifestyle Changes diet recommendations, which includes a discussion on lipid goals, dietary fat, sodium, fiber, plant stanol/sterol esters, sugar, and the components of a well-balanced, healthy diet.      CARDIAC REHAB PHASE II EXERCISE from 07/16/2015 in [place]   Date  06/14/15   Educator  RD   Instruction Review Code  Not applicable [Handout given]      Nutrition II class: Lifestyle Skills:  -Group instruction provided by PowerPoint slides, verbal discussion, and written materials to support subject matter. The instructor gives an explanation and review of label reading, grocery shopping for heart health, heart healthy recipe modifications, and ways to make healthier choices when eating out.      CARDIAC REHAB PHASE II EXERCISE from 07/16/2015 in [place]   Date  06/14/15   Educator  RD   Instruction Review Code  Not applicable [class handouts given]      Diabetes Question & Answer:  -Group instruction provided by PowerPoint slides, verbal discussion, and written materials to support subject matter. The instructor gives an explanation and review of diabetes co-morbidities, pre- and post-prandial blood glucose goals, pre-exercise blood glucose goals, signs, symptoms, and treatment of hypoglycemia and hyperglycemia, and foot care basics.  Diabetes Blitz:  -Group instruction provided by PowerPoint slides, verbal discussion, and written materials to support subject matter. The instructor gives an explanation and review of the physiology behind type 1 and type 2 diabetes, diabetes medications and rational behind using different medications, pre- and post-prandial blood glucose recommendations and Hemoglobin A1c goals,  diabetes diet, and exercise including blood glucose guidelines for exercising safely.    Portion Distortion:  -Group instruction provided by PowerPoint slides, verbal discussion, written materials, and food models to support subject matter. The instructor gives an explanation of serving size versus portion size, changes in portions sizes over the last 20 years, and what consists of a serving from each food group.      CARDIAC REHAB PHASE II EXERCISE from 07/16/2015 in [place]   Date  06/23/15   Educator  RD   Instruction Review Code  2- meets goals/outcomes      Stress Management:  -Group instruction provided by verbal instruction, video, and written materials to support subject matter.  Instructors review role of stress in heart disease and how to cope with stress positively.        CARDIAC REHAB PHASE II EXERCISE from 07/16/2015 in [place]   Date  06/16/15   Instruction Review Code  2- meets goals/outcomes      Exercising on Your Own:  -Group instruction provided by verbal instruction, power point, and written materials to support subject.  Instructors discuss benefits of exercise, components of exercise, frequency and intensity of exercise, and end points for exercise.  Also discuss use of nitroglycerin and activating EMS.  Review options of places to exercise outside of rehab.  Review guidelines for sex with heart disease.   Cardiac Drugs I:  -Group instruction provided by verbal instruction and written materials to support subject.  Instructor reviews cardiac drug classes: antiplatelets, anticoagulants, beta blockers, and statins.  Instructor discusses reasons, side effects, and lifestyle considerations for each drug class.          CARDIAC REHAB PHASE II EXERCISE from 07/16/2015 in [place]   Date  06/09/15   Educator  Pharm D   Instruction Review Code  2- meets goals/outcomes       Cardiac Drugs II:  -Group instruction provided by verbal instruction and written materials to support subject.  Instructor reviews cardiac drug classes: angiotensin converting enzyme inhibitors (ACE-I), angiotensin II receptor blockers (ARBs), nitrates, and calcium channel blockers.  Instructor discusses reasons, side effects, and lifestyle considerations for each drug class.   Anatomy and Physiology of the Circulatory System:  -Group instruction provided by verbal instruction, video, and written materials to support subject.  Reviews functional anatomy of heart, how it relates to various diagnoses, and what role the heart plays in the overall system.   Knowledge Questionnaire Score:     Knowledge Questionnaire Score - 06/03/15 1212    Knowledge Questionnaire Score   Pre Score 18/24      Core Components/Risk Factors/Patient Goals at Admission:     Personal Goals and Risk Factors at Admission - 06/03/15 0836    Core Components/Risk Factors/Patient Goals on Admission    Weight Management Obesity;Yes;Weight Loss   Intervention Obesity: Provide education and appropriate resources to help participant work on and attain dietary goals.;Weight Management/Obesity: Establish reasonable short term and long term weight goals.;Weight Management: Provide education and appropriate resources to help participant work on and attain dietary goals.;Weight Management: Develop a combined  nutrition and exercise program designed to reach desired caloric intake, while maintaining appropriate intake of nutrient and fiber, sodium and fats, and appropriate energy expenditure required for the weight goal.   Admit Weight 268 lb 8.3 oz (121.8 kg)   Goal Weight: Short Term 265 lb (120.203 kg)   Goal Weight: Long Term 260 lb (117.935 kg)   Expected Outcomes Short Term: Continue to assess and modify interventions until short term weight is achieved;Long Term: Adherence to nutrition and physical activity/exercise  program aimed toward attainment of established weight goal;Weight Loss: Understanding of general recommendations for a balanced deficit meal plan, which promotes 1-2 lb weight loss per week and includes a negative energy balance of [phone] kcal/d;Understanding recommendations for meals to include 15-35% energy as protein, 25-35% energy from fat, 35-60% energy from carbohydrates, less than 200mg  of dietary cholesterol, 20-35 gm of total fiber daily;Understanding of distribution of calorie intake throughout the day with the consumption of 4-5 meals/snacks   Sedentary Yes   Intervention Provide advice, education, support and counseling about physical activity/exercise needs.;Develop an individualized exercise prescription for aerobic and resistive training based on initial evaluation findings, risk stratification, comorbidities and participant's personal goals.   Expected Outcomes Achievement of increased cardiorespiratory fitness and enhanced flexibility, muscular endurance and strength shown through measurements of functional capacity and personal statement of participant.   Increase Strength and Stamina Yes   Intervention Provide advice, education, support and counseling about physical activity/exercise needs.;Develop an individualized exercise prescription for aerobic and resistive training based on initial evaluation findings, risk stratification, comorbidities and participant's personal goals.   Expected Outcomes Achievement of increased cardiorespiratory fitness and enhanced flexibility, muscular endurance and strength shown through measurements of functional capacity and personal statement of participant.   Tobacco Cessation Yes  quit 1970, 2 pk/yrs   Improve shortness of breath with ADL's Yes   Intervention Provide education, individualized exercise plan and daily activity instruction to help decrease symptoms of SOB with activities of daily living.   Expected Outcomes Short Term: Achieves a  reduction of symptoms when performing activities of daily living.   Heart Failure Yes   Intervention Provide a combined exercise and nutrition program that is supplemented with education, support and counseling about heart failure. Directed toward relieving symptoms such as shortness of breath, decreased exercise tolerance, and extremity edema.   Expected Outcomes Improve functional capacity of life;Short term: Attendance in program 2-3 days a week with increased exercise capacity. Reported lower sodium intake. Reported increased fruit and vegetable intake. Reports medication compliance.;Short term: Daily weights obtained and reported for increase. Utilizing diuretic protocols set by physician.;Long term: Adoption of self-care skills and reduction of barriers for early signs and symptoms recognition and intervention leading to self-care maintenance.   Hypertension Yes   Intervention Provide education on lifestyle modifcations including regular physical activity/exercise, weight management, moderate sodium restriction and increased consumption of fresh fruit, vegetables, and low fat dairy, alcohol moderation, and smoking cessation.;Monitor prescription use compliance.   Expected Outcomes Short Term: Continued assessment and intervention until BP is < 140/78mm HG in hypertensive participants. < 130/35mm HG in hypertensive participants with diabetes, heart failure or chronic kidney disease.;Long Term: Maintenance of blood pressure at goal levels.   Lipids Yes   Intervention Provide education and support for participant on nutrition & aerobic/resistive exercise along with prescribed medications to achieve LDL 70mg , HDL >40mg .   Expected Outcomes Short Term: Participant states understanding of desired cholesterol values and is compliant with medications prescribed. Participant is following exercise  prescription and nutrition guidelines.;Long Term: Cholesterol controlled with medications as prescribed, with  individualized exercise RX and with personalized nutrition plan. Value goals: LDL < 70mg , HDL > 40 mg.   Stress Yes   Intervention Offer individual and/or small group education and counseling on adjustment to heart disease, stress management and health-related lifestyle change. Teach and support self-help strategies.;Refer participants experiencing significant psychosocial distress to appropriate mental health specialists for further evaluation and treatment. When possible, include family members and significant others in education/counseling sessions.   Expected Outcomes Short Term: Participant demonstrates changes in health-related behavior, relaxation and other stress management skills, ability to obtain effective social support, and compliance with psychotropic medications if prescribed.;Long Term: Emotional wellbeing is indicated by absence of clinically significant psychosocial distress or social isolation.   Personal Goal Other Yes   Personal Goal Improve diet and improve motor skills (improve balance)   Intervention Provide education on diet and exercise, diet guidelines, balance exercises   Expected Outcomes Adherance to diet and exericse guidelines      Core Components/Risk Factors/Patient Goals Review:      Goals and Risk Factor Review      06/16/15 1044 07/16/15 1342         Core Components/Risk Factors/Patient Goals Review   Personal Goals Review Weight Management/Obesity;Sedentary;Increase Strength and Stamina;Improve shortness of breath with ADL's;Hypertension;Other Sedentary;Increase Strength and Stamina;Improve shortness of breath with ADL's;Other      Review Pt is losing a little weight, has made some changes to his diet and using kettle bells at home.  Will review home ex on 4/20.  Breathing is better with ADLs, just not exercise yet.  Blood pressures are stable. Pt feels balance is better, breathing is better and feels strength and stamina is increasing with exercise. Pt  attended functional fitness class for balance exercises.      Expected Outcomes Conintue to improve diet and add exercise to improve cardiorespiratory fitness. Continue exercising 2 days per week in addition to 3 days per week at cardiac rehab.         Core Components/Risk Factors/Patient Goals at Discharge (Final Review):      Goals and Risk Factor Review - 07/16/15 1342    Core Components/Risk Factors/Patient Goals Review   Personal Goals Review Sedentary;Increase Strength and Stamina;Improve shortness of breath with ADL's;Other   Review Pt feels balance is better, breathing is better and feels strength and stamina is increasing with exercise. Pt attended functional fitness class for balance exercises.   Expected Outcomes Continue exercising 2 days per week in addition to 3 days per week at cardiac rehab.      ITP Comments:     ITP Comments      06/03/15 0834           ITP Comments Medical Director-Dr. Fransico Him, MD          Comments: Pt is making expected progress toward personal goals after completing 20 sessions. Recommend continued exercise and life style modification education including  stress management and relaxation techniques to decrease cardiac risk profile. Barnet Pall, RN,BSN 07/22/2015 10:07 AM

## 2015-07-21 ENCOUNTER — Encounter: Payer: Self-pay | Admitting: Podiatry

## 2015-07-21 ENCOUNTER — Ambulatory Visit (INDEPENDENT_AMBULATORY_CARE_PROVIDER_SITE_OTHER): Payer: Medicare Other | Admitting: Podiatry

## 2015-07-21 ENCOUNTER — Encounter (HOSPITAL_COMMUNITY)
Admission: RE | Admit: 2015-07-21 | Discharge: 2015-07-21 | Disposition: A | Discharge status: Home / Self Care | Payer: Medicare Other | Source: Ambulatory Visit | Attending: Cardiology | Admitting: Cardiology

## 2015-07-21 DIAGNOSIS — M79673 Pain in unspecified foot: Secondary | ICD-10-CM

## 2015-07-21 DIAGNOSIS — B351 Tinea unguium: Secondary | ICD-10-CM | POA: Diagnosis not present

## 2015-07-21 DIAGNOSIS — I130 Hypertensive heart and chronic kidney disease with heart failure and stage 1 through stage 4 chronic kidney disease, or unspecified chronic kidney disease: Secondary | ICD-10-CM | POA: Diagnosis not present

## 2015-07-21 DIAGNOSIS — Z79899 Other long term (current) drug therapy: Secondary | ICD-10-CM | POA: Diagnosis not present

## 2015-07-21 DIAGNOSIS — K219 Gastro-esophageal reflux disease without esophagitis: Secondary | ICD-10-CM | POA: Diagnosis not present

## 2015-07-21 DIAGNOSIS — I5042 Chronic combined systolic (congestive) and diastolic (congestive) heart failure: Secondary | ICD-10-CM

## 2015-07-21 DIAGNOSIS — E669 Obesity, unspecified: Secondary | ICD-10-CM | POA: Diagnosis not present

## 2015-07-21 DIAGNOSIS — Z7901 Long term (current) use of anticoagulants: Secondary | ICD-10-CM | POA: Diagnosis not present

## 2015-07-21 NOTE — Progress Notes (Signed)
Patient ID: Jose Snyder, male   DOB: [birth year], 75 y.o.   MRN: [id number] Complaint:  Visit Type: Patient returns to my office for continued preventative foot care services. Complaint: Patient states" my nails have grown long and thick and become painful to walk and wear shoes. The patient presents for preventative foot care services. No changes to ROS  Podiatric Exam: Vascular: dorsalis pedis and posterior tibial pulses are palpable bilateral. Capillary return is immediate. Temperature gradient is WNL. Skin turgor WNL  Sensorium: Normal Semmes Weinstein monofilament test. Normal tactile sensation bilaterally. Nail Exam: Pt has thick disfigured discolored nails with subungual debris noted bilateral entire nail hallux through fifth toenails Ulcer Exam: There is no evidence of ulcer or pre-ulcerative changes or infection. Orthopedic Exam: Muscle tone and strength are WNL. No limitations in general ROM. No crepitus or effusions noted. Foot type and digits show no abnormalities. Bony prominences are unremarkable. Skin: No Porokeratosis. No infection or ulcers  Diagnosis:  Onychomycosis, , Pain in right toe, pain in left toes  Treatment & Plan Procedures and Treatment: Consent by patient was obtained for treatment procedures. The patient understood the discussion of treatment and procedures well. All questions were answered thoroughly reviewed. Debridement of mycotic and hypertrophic toenails, 1 through 5 bilateral and clearing of subungual debris. No ulceration, no infection noted.  Return Visit-Office Procedure: Patient instructed to return to the office for a follow up visit 3 months for continued evaluation and treatment.  [name] DPM

## 2015-07-23 ENCOUNTER — Encounter (HOSPITAL_COMMUNITY)
Admission: RE | Admit: 2015-07-23 | Discharge: 2015-07-23 | Disposition: A | Discharge status: Home / Self Care | Payer: Medicare Other | Source: Ambulatory Visit | Attending: Cardiology | Admitting: Cardiology

## 2015-07-23 DIAGNOSIS — I5042 Chronic combined systolic (congestive) and diastolic (congestive) heart failure: Secondary | ICD-10-CM | POA: Diagnosis not present

## 2015-07-23 DIAGNOSIS — Z79899 Other long term (current) drug therapy: Secondary | ICD-10-CM | POA: Diagnosis not present

## 2015-07-23 DIAGNOSIS — I130 Hypertensive heart and chronic kidney disease with heart failure and stage 1 through stage 4 chronic kidney disease, or unspecified chronic kidney disease: Secondary | ICD-10-CM | POA: Diagnosis not present

## 2015-07-23 DIAGNOSIS — E669 Obesity, unspecified: Secondary | ICD-10-CM | POA: Diagnosis not present

## 2015-07-23 DIAGNOSIS — K219 Gastro-esophageal reflux disease without esophagitis: Secondary | ICD-10-CM | POA: Diagnosis not present

## 2015-07-23 DIAGNOSIS — Z7901 Long term (current) use of anticoagulants: Secondary | ICD-10-CM | POA: Diagnosis not present

## 2015-07-28 ENCOUNTER — Encounter (HOSPITAL_COMMUNITY)
Admission: RE | Admit: 2015-07-28 | Discharge: 2015-07-28 | Disposition: A | Discharge status: Home / Self Care | Payer: Medicare Other | Source: Ambulatory Visit | Attending: Cardiology | Admitting: Cardiology

## 2015-07-28 DIAGNOSIS — I130 Hypertensive heart and chronic kidney disease with heart failure and stage 1 through stage 4 chronic kidney disease, or unspecified chronic kidney disease: Secondary | ICD-10-CM | POA: Diagnosis not present

## 2015-07-28 DIAGNOSIS — K219 Gastro-esophageal reflux disease without esophagitis: Secondary | ICD-10-CM | POA: Diagnosis not present

## 2015-07-28 DIAGNOSIS — I5042 Chronic combined systolic (congestive) and diastolic (congestive) heart failure: Secondary | ICD-10-CM

## 2015-07-28 DIAGNOSIS — Z79899 Other long term (current) drug therapy: Secondary | ICD-10-CM | POA: Diagnosis not present

## 2015-07-28 DIAGNOSIS — Z7901 Long term (current) use of anticoagulants: Secondary | ICD-10-CM | POA: Diagnosis not present

## 2015-07-28 DIAGNOSIS — E669 Obesity, unspecified: Secondary | ICD-10-CM | POA: Diagnosis not present

## 2015-07-30 ENCOUNTER — Encounter (HOSPITAL_COMMUNITY)
Admission: RE | Admit: 2015-07-30 | Discharge: 2015-07-30 | Disposition: A | Discharge status: Home / Self Care | Payer: Medicare Other | Source: Ambulatory Visit | Attending: Cardiology | Admitting: Cardiology

## 2015-07-30 DIAGNOSIS — Z6835 Body mass index (BMI) 35.0-35.9, adult: Secondary | ICD-10-CM | POA: Diagnosis not present

## 2015-07-30 DIAGNOSIS — N189 Chronic kidney disease, unspecified: Secondary | ICD-10-CM | POA: Insufficient documentation

## 2015-07-30 DIAGNOSIS — C61 Malignant neoplasm of prostate: Secondary | ICD-10-CM | POA: Diagnosis not present

## 2015-07-30 DIAGNOSIS — I4891 Unspecified atrial fibrillation: Secondary | ICD-10-CM | POA: Diagnosis not present

## 2015-07-30 DIAGNOSIS — Z79899 Other long term (current) drug therapy: Secondary | ICD-10-CM | POA: Insufficient documentation

## 2015-07-30 DIAGNOSIS — E669 Obesity, unspecified: Secondary | ICD-10-CM | POA: Diagnosis not present

## 2015-07-30 DIAGNOSIS — I130 Hypertensive heart and chronic kidney disease with heart failure and stage 1 through stage 4 chronic kidney disease, or unspecified chronic kidney disease: Secondary | ICD-10-CM | POA: Diagnosis not present

## 2015-07-30 DIAGNOSIS — I5042 Chronic combined systolic (congestive) and diastolic (congestive) heart failure: Secondary | ICD-10-CM | POA: Insufficient documentation

## 2015-07-30 DIAGNOSIS — I2510 Atherosclerotic heart disease of native coronary artery without angina pectoris: Secondary | ICD-10-CM | POA: Insufficient documentation

## 2015-07-30 DIAGNOSIS — K219 Gastro-esophageal reflux disease without esophagitis: Secondary | ICD-10-CM | POA: Diagnosis not present

## 2015-07-30 DIAGNOSIS — E785 Hyperlipidemia, unspecified: Secondary | ICD-10-CM | POA: Insufficient documentation

## 2015-07-30 DIAGNOSIS — I428 Other cardiomyopathies: Secondary | ICD-10-CM | POA: Insufficient documentation

## 2015-07-30 DIAGNOSIS — Z7901 Long term (current) use of anticoagulants: Secondary | ICD-10-CM | POA: Diagnosis not present

## 2015-08-02 ENCOUNTER — Encounter (HOSPITAL_COMMUNITY)
Admission: RE | Admit: 2015-08-02 | Discharge: 2015-08-02 | Disposition: A | Discharge status: Home / Self Care | Payer: Medicare Other | Source: Ambulatory Visit | Attending: Cardiology | Admitting: Cardiology

## 2015-08-02 DIAGNOSIS — E669 Obesity, unspecified: Secondary | ICD-10-CM | POA: Diagnosis not present

## 2015-08-02 DIAGNOSIS — I5042 Chronic combined systolic (congestive) and diastolic (congestive) heart failure: Secondary | ICD-10-CM | POA: Diagnosis not present

## 2015-08-02 DIAGNOSIS — I130 Hypertensive heart and chronic kidney disease with heart failure and stage 1 through stage 4 chronic kidney disease, or unspecified chronic kidney disease: Secondary | ICD-10-CM | POA: Diagnosis not present

## 2015-08-02 DIAGNOSIS — Z7901 Long term (current) use of anticoagulants: Secondary | ICD-10-CM | POA: Diagnosis not present

## 2015-08-02 DIAGNOSIS — K219 Gastro-esophageal reflux disease without esophagitis: Secondary | ICD-10-CM | POA: Diagnosis not present

## 2015-08-02 DIAGNOSIS — Z79899 Other long term (current) drug therapy: Secondary | ICD-10-CM | POA: Diagnosis not present

## 2015-08-04 ENCOUNTER — Encounter (HOSPITAL_COMMUNITY)
Admission: RE | Admit: 2015-08-04 | Discharge: 2015-08-04 | Disposition: A | Discharge status: Home / Self Care | Payer: Medicare Other | Source: Ambulatory Visit | Attending: Cardiology | Admitting: Cardiology

## 2015-08-04 DIAGNOSIS — Z7901 Long term (current) use of anticoagulants: Secondary | ICD-10-CM | POA: Diagnosis not present

## 2015-08-04 DIAGNOSIS — I130 Hypertensive heart and chronic kidney disease with heart failure and stage 1 through stage 4 chronic kidney disease, or unspecified chronic kidney disease: Secondary | ICD-10-CM | POA: Diagnosis not present

## 2015-08-04 DIAGNOSIS — I5042 Chronic combined systolic (congestive) and diastolic (congestive) heart failure: Secondary | ICD-10-CM | POA: Diagnosis not present

## 2015-08-04 DIAGNOSIS — E669 Obesity, unspecified: Secondary | ICD-10-CM | POA: Diagnosis not present

## 2015-08-04 DIAGNOSIS — K219 Gastro-esophageal reflux disease without esophagitis: Secondary | ICD-10-CM | POA: Diagnosis not present

## 2015-08-04 DIAGNOSIS — Z79899 Other long term (current) drug therapy: Secondary | ICD-10-CM | POA: Diagnosis not present

## 2015-08-06 ENCOUNTER — Encounter (HOSPITAL_COMMUNITY)
Admission: RE | Admit: 2015-08-06 | Discharge: 2015-08-06 | Disposition: A | Discharge status: Home / Self Care | Payer: Medicare Other | Source: Ambulatory Visit | Attending: Cardiology | Admitting: Cardiology

## 2015-08-06 DIAGNOSIS — K219 Gastro-esophageal reflux disease without esophagitis: Secondary | ICD-10-CM | POA: Diagnosis not present

## 2015-08-06 DIAGNOSIS — E669 Obesity, unspecified: Secondary | ICD-10-CM | POA: Diagnosis not present

## 2015-08-06 DIAGNOSIS — I130 Hypertensive heart and chronic kidney disease with heart failure and stage 1 through stage 4 chronic kidney disease, or unspecified chronic kidney disease: Secondary | ICD-10-CM | POA: Diagnosis not present

## 2015-08-06 DIAGNOSIS — I5042 Chronic combined systolic (congestive) and diastolic (congestive) heart failure: Secondary | ICD-10-CM

## 2015-08-06 DIAGNOSIS — Z7901 Long term (current) use of anticoagulants: Secondary | ICD-10-CM | POA: Diagnosis not present

## 2015-08-06 DIAGNOSIS — Z79899 Other long term (current) drug therapy: Secondary | ICD-10-CM | POA: Diagnosis not present

## 2015-08-09 ENCOUNTER — Encounter (HOSPITAL_COMMUNITY)
Admission: RE | Admit: 2015-08-09 | Discharge: 2015-08-09 | Disposition: A | Discharge status: Home / Self Care | Payer: Medicare Other | Source: Ambulatory Visit | Attending: Cardiology | Admitting: Cardiology

## 2015-08-09 DIAGNOSIS — E669 Obesity, unspecified: Secondary | ICD-10-CM | POA: Diagnosis not present

## 2015-08-09 DIAGNOSIS — I5042 Chronic combined systolic (congestive) and diastolic (congestive) heart failure: Secondary | ICD-10-CM

## 2015-08-09 DIAGNOSIS — Z7901 Long term (current) use of anticoagulants: Secondary | ICD-10-CM | POA: Diagnosis not present

## 2015-08-09 DIAGNOSIS — Z79899 Other long term (current) drug therapy: Secondary | ICD-10-CM | POA: Diagnosis not present

## 2015-08-09 DIAGNOSIS — K219 Gastro-esophageal reflux disease without esophagitis: Secondary | ICD-10-CM | POA: Diagnosis not present

## 2015-08-09 DIAGNOSIS — I130 Hypertensive heart and chronic kidney disease with heart failure and stage 1 through stage 4 chronic kidney disease, or unspecified chronic kidney disease: Secondary | ICD-10-CM | POA: Diagnosis not present

## 2015-08-11 ENCOUNTER — Encounter (HOSPITAL_COMMUNITY): Payer: Medicare Other

## 2015-08-13 ENCOUNTER — Encounter (HOSPITAL_COMMUNITY)
Admission: RE | Admit: 2015-08-13 | Discharge: 2015-08-13 | Disposition: A | Discharge status: Home / Self Care | Payer: Medicare Other | Source: Ambulatory Visit | Attending: Cardiology | Admitting: Cardiology

## 2015-08-13 DIAGNOSIS — I130 Hypertensive heart and chronic kidney disease with heart failure and stage 1 through stage 4 chronic kidney disease, or unspecified chronic kidney disease: Secondary | ICD-10-CM | POA: Diagnosis not present

## 2015-08-13 DIAGNOSIS — E669 Obesity, unspecified: Secondary | ICD-10-CM | POA: Diagnosis not present

## 2015-08-13 DIAGNOSIS — K219 Gastro-esophageal reflux disease without esophagitis: Secondary | ICD-10-CM | POA: Diagnosis not present

## 2015-08-13 DIAGNOSIS — Z79899 Other long term (current) drug therapy: Secondary | ICD-10-CM | POA: Diagnosis not present

## 2015-08-13 DIAGNOSIS — I5042 Chronic combined systolic (congestive) and diastolic (congestive) heart failure: Secondary | ICD-10-CM | POA: Diagnosis not present

## 2015-08-13 DIAGNOSIS — Z7901 Long term (current) use of anticoagulants: Secondary | ICD-10-CM | POA: Diagnosis not present

## 2015-08-16 ENCOUNTER — Encounter (HOSPITAL_COMMUNITY)
Admission: RE | Admit: 2015-08-16 | Discharge: 2015-08-16 | Disposition: A | Discharge status: Home / Self Care | Payer: Medicare Other | Source: Ambulatory Visit | Attending: Cardiology | Admitting: Cardiology

## 2015-08-16 DIAGNOSIS — Z79899 Other long term (current) drug therapy: Secondary | ICD-10-CM | POA: Diagnosis not present

## 2015-08-16 DIAGNOSIS — I5042 Chronic combined systolic (congestive) and diastolic (congestive) heart failure: Secondary | ICD-10-CM

## 2015-08-16 DIAGNOSIS — I130 Hypertensive heart and chronic kidney disease with heart failure and stage 1 through stage 4 chronic kidney disease, or unspecified chronic kidney disease: Secondary | ICD-10-CM | POA: Diagnosis not present

## 2015-08-16 DIAGNOSIS — E669 Obesity, unspecified: Secondary | ICD-10-CM | POA: Diagnosis not present

## 2015-08-16 DIAGNOSIS — K219 Gastro-esophageal reflux disease without esophagitis: Secondary | ICD-10-CM | POA: Diagnosis not present

## 2015-08-16 DIAGNOSIS — Z7901 Long term (current) use of anticoagulants: Secondary | ICD-10-CM | POA: Diagnosis not present

## 2015-08-18 ENCOUNTER — Encounter (HOSPITAL_COMMUNITY)
Admission: RE | Admit: 2015-08-18 | Discharge: 2015-08-18 | Disposition: A | Discharge status: Home / Self Care | Payer: Medicare Other | Source: Ambulatory Visit | Attending: Cardiology | Admitting: Cardiology

## 2015-08-18 DIAGNOSIS — I5042 Chronic combined systolic (congestive) and diastolic (congestive) heart failure: Secondary | ICD-10-CM

## 2015-08-18 DIAGNOSIS — Z7901 Long term (current) use of anticoagulants: Secondary | ICD-10-CM | POA: Diagnosis not present

## 2015-08-18 DIAGNOSIS — E669 Obesity, unspecified: Secondary | ICD-10-CM | POA: Diagnosis not present

## 2015-08-18 DIAGNOSIS — I130 Hypertensive heart and chronic kidney disease with heart failure and stage 1 through stage 4 chronic kidney disease, or unspecified chronic kidney disease: Secondary | ICD-10-CM | POA: Diagnosis not present

## 2015-08-18 DIAGNOSIS — K219 Gastro-esophageal reflux disease without esophagitis: Secondary | ICD-10-CM | POA: Diagnosis not present

## 2015-08-18 DIAGNOSIS — Z79899 Other long term (current) drug therapy: Secondary | ICD-10-CM | POA: Diagnosis not present

## 2015-08-19 NOTE — Progress Notes (Signed)
Cardiac Individual Treatment Plan  Patient Details  Name: Jose Snyder MRN: [id number] Date of Birth: 1940-08-12 Referring Provider:        CARDIAC REHAB PHASE II ORIENTATION from 06/03/2015 in [place]   Referring Provider  Kirk Ruths MD      Initial Encounter Date:       CARDIAC REHAB PHASE II ORIENTATION from 06/03/2015 in [place]   Date  06/03/15   Referring Provider  Kirk Ruths MD      Visit Diagnosis: Chronic combined systolic and diastolic congestive heart failure ([place])  Patient's Home Medications on Admission:  Current outpatient prescriptions:  .  apixaban (ELIQUIS) 5 MG TABS tablet, Take 1 tablet (5 mg total) by mouth 2 (two) times daily., Disp: 60 tablet, Rfl: 11 .  atorvastatin (LIPITOR) 80 MG tablet, Take 1 tablet (80 mg total) by mouth daily with breakfast., Disp: 30 tablet, Rfl: 12 .  betamethasone dipropionate (DIPROLENE) 0.05 % ointment, Apply topically 2 (two) times daily., Disp: , Rfl:  .  Cholecalciferol (VITAMIN D) 2000 UNITS tablet, Take 2,000 Units by mouth daily., Disp: , Rfl:  .  docusate sodium (COLACE) 100 MG capsule, Take 100 mg by mouth daily as needed for mild constipation., Disp: , Rfl:  .  fluticasone (FLONASE) 50 MCG/ACT nasal spray, Place 1 spray into both nostrils daily as needed for allergies or rhinitis. , Disp: , Rfl:  .  furosemide (LASIX) 40 MG tablet, Take 1 tablet (40 mg total) by mouth 2 (two) times daily. If swelling takes an extra dose, Disp: 60 tablet, Rfl: 6 .  ibuprofen (ADVIL,MOTRIN) 200 MG tablet, Take 400 mg by mouth daily., Disp: , Rfl:  .  lisinopril (PRINIVIL,ZESTRIL) 20 MG tablet, Take 1 tablet (20 mg total) by mouth daily with breakfast., Disp: 30 tablet, Rfl: 12 .  metoprolol succinate (TOPROL-XL) 100 MG 24 hr tablet, Take 1 tablet (100 mg total) by mouth daily., Disp: 30 tablet, Rfl: 12 .  omeprazole (PRILOSEC) 20 MG capsule, Take 20 mg by mouth daily.,  Disp: , Rfl:  .  potassium chloride SA (K-DUR,KLOR-CON) 20 MEQ tablet, Take 1 tablet (20 mEq total) by mouth daily., Disp: 30 tablet, Rfl: 11 .  temazepam (RESTORIL) 15 MG capsule, Take 15 mg by mouth at bedtime as needed. , Disp: , Rfl:   Past Medical History: Past Medical History  Diagnosis Date  . Hypertension   . Obesity   . GERD (gastroesophageal reflux disease)   . Chronic combined systolic and diastolic heart failure ([place]) 06/26/2013    a.  Echo (06/25/13):  EF 20-25%, diff HK, restrictive physio, mild MR, mod to severe LAE, mild RVE, mildly reduced RVSF, mod to severe RAE  . NICM (nonischemic cardiomyopathy) with EF 20-25% 06/26/2013  . Atrial Fibrillation  06/25/2013  . Dyslipidemia 07/15/2013  . CAD (coronary artery disease), native coronary artery 06/28/2013    a. LHC (06/27/13):  Mid LAD 40%, mid D1 75%, mid CFX 50-60%, mid RCA 50%, EF 20%, LVEDP 24 mmHg.  Marland Kitchen Penetrating atherosclerotic ulcer of aorta ([place]) 10/22/2013    Small penetrating ulcer of descending thoracic aorta discovered on routine CTA  . Shortness of breath     seen by [facility], & low energy   . CHF (congestive heart failure) ([place])   . Dysrhythmia     a-fib  . Anxiety   . Arthritis     R hand- OA  . Prostate cancer ([place])  watchful waiting, no treatment so far  . S/P Maze operation for atrial fibrillation 10/29/2013    Complete bilateral atrial lesion set using cryothermy and bipolar radiofrequency ablation with clipping of LA appendage via right mini thoracotomy  . Chronic kidney disease   . Incidental pulmonary nodule 06/22/2014    Seen on CT angiogram of chest    Tobacco Use: History  Smoking status  . Never Smoker   Smokeless tobacco  . Never Used    Comment: 06/25/2013 "smoked very light; years and years ago"    Labs: Recent Review Flowsheet Data    Labs for ITP Cardiac and Pulmonary Rehab Latest Ref Rng 10/30/2013 10/30/2013 10/30/2013 10/30/2013 05/20/2015   Cholestrol 125 - 200 mg/dL - - - - 117(L)    LDLCALC <130 mg/dL - - - - 32   HDL >=40 mg/dL - - - - 43   Trlycerides <150 mg/dL - - - - 211(H)   PHART 7.350 - 7.450 7.406 7.425 - 7.448 -   PCO2ART 35.0 - 45.0 mmHg 32.8(L) 30.7(L) - 29.4(L) -   HCO3 20.0 - 24.0 mEq/L 20.8 20.2 - 20.5 -   TCO2 0 - 100 mmol/L 22 21 19 21  -   ACIDBASEDEF 0.0 - 2.0 mmol/L 3.0(H) 3.0(H) - 3.0(H) -   O2SAT - 90.0 88.0 - 95.0 -      Capillary Blood Glucose: Lab Results  Component Value Date   GLUCAP 121* 06/07/2015   GLUCAP 139* 10/31/2013   GLUCAP 108* 10/31/2013   GLUCAP 110* 10/30/2013   GLUCAP 136* 10/30/2013     Exercise Target Goals:    Exercise Program Goal: Individual exercise prescription set with THRR, safety & activity barriers. Participant demonstrates ability to understand and report RPE using BORG scale, to self-measure pulse accurately, and to acknowledge the importance of the exercise prescription.  Exercise Prescription Goal: Starting with aerobic activity 30 plus minutes a day, 3 days per week for initial exercise prescription. Provide home exercise prescription and guidelines that participant acknowledges understanding prior to discharge.  Activity Barriers & Risk Stratification:     Activity Barriers & Cardiac Risk Stratification - 06/03/15 0834    Activity Barriers & Cardiac Risk Stratification   Activity Barriers Deconditioning;Muscular Weakness;Balance Concerns;Left Knee Replacement;Arthritis;Shortness of Breath   Cardiac Risk Stratification High      6 Minute Walk:     6 Minute Walk      06/03/15 0800       6 Minute Walk   Phase Initial     Distance 688 feet     Walk Time 5.23 minutes     # of Rest Breaks 1     MPH 1.49     METS 1.36     RPE 13     Perceived Dyspnea  2  95%     VO2 Peak 4.79     Symptoms Yes (comment)     Comments Shortness of breath     Resting HR 84 bpm     Resting BP 136/74 mmHg     Max Ex. HR 113 bpm     Max Ex. BP 140/80 mmHg     2 Minute Post BP 122/62 mmHg         Initial Exercise Prescription:     Initial Exercise Prescription - 06/10/15 1300    Date of Initial Exercise RX and Referring Provider   Date 06/03/15   Referring Provider Kirk Ruths MD   Recumbant Bike   Level 1  RPM 30   Watts 5   Minutes 10   METs 1.4   T5 Nustep   Level 1   Minutes 10   METs 1.5   Track   Laps 5   Minutes 10   METs 0.84   Prescription Details   Frequency (times per week) 3   Intensity   THRR 40-80% of Max Heartrate 58-117   Ratings of Perceived Exertion 11-13   Perceived Dyspnea 0-4   Progression   Progression Continue to progress workloads to maintain intensity without signs/symptoms of physical distress.   Resistance Training   Training Prescription Yes   Weight 2 lbs   Reps 10-12      Perform Capillary Blood Glucose checks as needed.  Exercise Prescription Changes:     Exercise Prescription Changes      06/15/15 1000 06/18/15 1000 06/29/15 0800 07/16/15 1300 07/28/15 1300   Exercise Review   Progression Yes Yes Yes Yes Yes   Response to Exercise   Blood Pressure (Admit) 160/82 mmHg 160/82 mmHg 104/60 mmHg 148/80 mmHg 118/72 mmHg   Blood Pressure (Exercise) 182/90 mmHg 182/90 mmHg 128/74 mmHg 142/82 mmHg 142/82 mmHg   Blood Pressure (Exit) 110/64 mmHg 110/64 mmHg 140/84 mmHg 146/80 mmHg 118/60 mmHg   Heart Rate (Admit) 80 bpm 80 bpm 82 bpm 87 bpm 71 bpm   Heart Rate (Exercise) 114 bpm 114 bpm 105 bpm 103 bpm 98 bpm   Heart Rate (Exit) 61 bpm 61 bpm 72 bpm 86 bpm 68 bpm   Oxygen Saturation (Exercise) 97 % 97 %      Oxygen Saturation (Exit)    96 %    Rating of Perceived Exertion (Exercise) 13 13 12 11 11    Symptoms none none walking fatigue increased none none   Comments  Home Exercise Given 06/18/15 Home Exercise Given 06/18/15 Home Exercise Given 06/18/15 Home Exercise Given 06/18/15   Duration Progress to 30 minutes of continuous aerobic without signs/symptoms of physical distress Progress to 30 minutes of continuous aerobic  without signs/symptoms of physical distress Progress to 30 minutes of continuous aerobic without signs/symptoms of physical distress Progress to 30 minutes of continuous aerobic without signs/symptoms of physical distress Progress to 30 minutes of continuous aerobic without signs/symptoms of physical distress   Intensity THRR unchanged THRR unchanged THRR unchanged THRR unchanged THRR unchanged   Progression   Progression Continue to progress workloads to maintain intensity without signs/symptoms of physical distress. Continue to progress workloads to maintain intensity without signs/symptoms of physical distress. Continue to progress workloads to maintain intensity without signs/symptoms of physical distress. Continue to progress workloads to maintain intensity without signs/symptoms of physical distress. Continue to progress workloads to maintain intensity without signs/symptoms of physical distress.   Average METs 2.3 2.3 2.6 3.4 2.8   Resistance Training   Training Prescription Yes Yes Yes Yes Yes   Weight 2 lbs 2 lbs 2 lbs 4 lbs 4 lbs   Reps 10-12 10-12 10-12 10-12 10-12   Interval Training   Interval Training No No No No No   Recumbant Bike   Level 2 2 3 4 4    Watts 24 24      Minutes 10 10 10 10 10    METs 2.6 2.6 3.3 3.8 3.6   Arm Ergometer   Level   3 3 3    Watts   10 14 15    Minutes   10 10 10    METs   1.42 2.08 1.66   T5 Nustep  Level 3 3 3 3 3    Minutes 10 10 10 10 10    METs 2.2 2.2 3.1 2.7 3   Track   Laps 6 6      Minutes 10 10      METs 2 2      Home Exercise Plan   Plans to continue exercise at  Home  Walking and [facility]  Walking and [facility]  Walking and [facility]  Walking and Kettlebells   Frequency  Add 2 additional days to program exercise sessions. Add 2 additional days to program exercise sessions. Add 2 additional days to program exercise sessions. Add 2 additional days to program exercise sessions.     08/09/15 1700            Exercise Review   Progression Yes       Response to Exercise   Blood Pressure (Admit) 138/90 mmHg       Blood Pressure (Exercise) 160/70 mmHg       Blood Pressure (Exit) 130/84 mmHg       Heart Rate (Admit) 75 bpm       Heart Rate (Exercise) 103 bpm       Heart Rate (Exit) 72 bpm       Rating of Perceived Exertion (Exercise) 13       Symptoms none       Comments Home Exercise Given 06/18/15       Duration Progress to 30 minutes of continuous aerobic without signs/symptoms of physical distress       Intensity THRR unchanged       Progression   Progression Continue to progress workloads to maintain intensity without signs/symptoms of physical distress.       Average METs 3       Resistance Training   Training Prescription Yes       Weight 4 lbs       Reps 10-12       Interval Training   Interval Training No       Recumbant Bike   Level 4       Minutes 10       METs 3.8       Arm Ergometer   Level 3       Watts 19       Minutes 10       METs 1.87       T5 Nustep   Level 3       Minutes 10       METs 3.1       Home Exercise Plan   Plans to continue exercise at Home  Walking and Kettlebells       Frequency Add 2 additional days to program exercise sessions.          Exercise Comments:     Exercise Comments      06/15/15 1021 06/29/15 0846 07/16/15 1340 07/28/15 1358 08/09/15 1703   Exercise Comments Pt is tolerating exercise well and continues to make progress.  He has had some elevated blood pressures.  Will continue to monitor. Pt has switched from walking to arm crank due to fatigue from walking. Pt feels balance and breathing are improving. Make steady progress with exercise. Tolerating exercise well without c/o. Maintaining exercise at cardiac rehab. Encouraged to walk more at home to enhanc ecardiorespiratory benefit and help with weight loss.      Discharge Exercise Prescription (Final Exercise Prescription Changes):     Exercise Prescription  Changes - 08/09/15  1700    Exercise Review   Progression Yes   Response to Exercise   Blood Pressure (Admit) 138/90 mmHg   Blood Pressure (Exercise) 160/70 mmHg   Blood Pressure (Exit) 130/84 mmHg   Heart Rate (Admit) 75 bpm   Heart Rate (Exercise) 103 bpm   Heart Rate (Exit) 72 bpm   Rating of Perceived Exertion (Exercise) 13   Symptoms none   Comments Home Exercise Given 06/18/15   Duration Progress to 30 minutes of continuous aerobic without signs/symptoms of physical distress   Intensity THRR unchanged   Progression   Progression Continue to progress workloads to maintain intensity without signs/symptoms of physical distress.   Average METs 3   Resistance Training   Training Prescription Yes   Weight 4 lbs   Reps 10-12   Interval Training   Interval Training No   Recumbant Bike   Level 4   Minutes 10   METs 3.8   Arm Ergometer   Level 3   Watts 19   Minutes 10   METs 1.87   T5 Nustep   Level 3   Minutes 10   METs 3.1   Home Exercise Plan   Plans to continue exercise at Home  Walking and Kettlebells   Frequency Add 2 additional days to program exercise sessions.      Nutrition:  Target Goals: Understanding of nutrition guidelines, daily intake of sodium 1500mg , cholesterol 200mg , calories 30% from fat and 7% or less from saturated fats, daily to have 5 or more servings of fruits and vegetables.  Biometrics:     Pre Biometrics - 06/03/15 1213    Pre Biometrics   Height 6\' 1"  (1.854 m)   Weight 268 lb 8.3 oz (121.8 kg)   Waist Circumference 46 inches   Hip Circumference 46 inches   Waist to Hip Ratio 1 %   BMI (Calculated) 35.5   Triceps Skinfold 25 mm   % Body Fat 34.9 %   Grip Strength 30 kg   Flexibility 0 in   Single Leg Stand 2.21 seconds       Nutrition Therapy Plan and Nutrition Goals:     Nutrition Therapy & Goals - 06/14/15 1100    Nutrition Therapy   Diet Therapeutic Lifestyle Changes   Personal Nutrition Goals   Personal Goal #1 0.5-2 lb weight loss  per week to a goal wt loss of 6-24 lb at graduation from [place], educate and counsel regarding individualized specific dietary modifications aiming towards targeted core components such as weight, hypertension, lipid management, diabetes, heart failure and other comorbidities.;Nutrition handout(s) given to patient.  Handouts for Nutrition I & II classes, 5 day menu ideas, and a list of meal preparation companys that cater to special dietary needs   Expected Outcomes Short Term Goal: Understand basic principles of dietary content, such as calories, fat, sodium, cholesterol and nutrients.;Long Term Goal: Adherence to prescribed nutrition plan.      Nutrition Discharge: Nutrition Scores:     Nutrition Assessments - 06/03/15 1457    MEDFICTS Scores   Pre Score 78      Nutrition Goals Re-Evaluation:   Psychosocial: Target Goals: Acknowledge presence or absence of depression, maximize coping skills, provide positive support system. Participant is able to verbalize types and ability to use techniques and skills needed for reducing stress and depression.  Initial Review & Psychosocial Screening:     Initial Psych  Review & Screening - 06/07/15 1146    Family Dynamics   Comments mr Laperle's wife passed away in 06-03-12      Quality of Life Scores:     Quality of Life - 06/03/15 1214    Quality of Life Scores   Health/Function Pre 25.43 %   Socioeconomic Pre 28.21 %   Psych/Spiritual Pre 28.29 %   Family Pre 27 %   GLOBAL Pre 26.85 %      PHQ-9:     Recent Review Flowsheet Data    Depression screen [facility] 2/9 06/07/2015   Decreased Interest 0   Down, Depressed, Hopeless 0   PHQ - 2 Score 0      Psychosocial Evaluation and Intervention:   Psychosocial Re-Evaluation:     Psychosocial Re-Evaluation      06/18/15 0936 07/20/15 1019 08/19/15 1804       Psychosocial Re-Evaluation   Interventions Encouraged to attend Cardiac  Rehabilitation for the exercise;Stress management education Encouraged to attend Cardiac Rehabilitation for the exercise;Stress management education Encouraged to attend Cardiac Rehabilitation for the exercise;Stress management education     Comments  Akeim is enjoying participating in cardiac rehab and has been able have social intereaction with his firends       Continued Psychosocial Services Needed No No No        Vocational Rehabilitation: Provide vocational rehab assistance to qualifying candidates.   Vocational Rehab Evaluation & Intervention:     Vocational Rehab - 06/04/15 0905    Initial Vocational Rehab Evaluation & Intervention   Assessment shows need for Vocational Rehabilitation No  Mr Dombeck is retired and does not need vocational rehab      Education: Education Goals: Education classes will be provided on a weekly basis, covering required topics. Participant will state understanding/return demonstration of topics presented.  Learning Barriers/Preferences:     Learning Barriers/Preferences - 06/03/15 1212    Learning Barriers/Preferences   Learning Barriers Sight;Hearing  reading glasses, bilateral hearing loss, no hearing aids   Learning Preferences Individual Instruction;Verbal Instruction;Written Material      Education Topics: Count Your Pulse:  -Group instruction provided by verbal instruction, demonstration, patient participation and written materials to support subject.  Instructors address importance of being able to find your pulse and how to count your pulse when at home without a heart monitor.  Patients get hands on experience counting their pulse with staff help and individually.   Heart Attack, Angina, and Risk Factor Modification:  -Group instruction provided by verbal instruction, video, and written materials to support subject.  Instructors address signs and symptoms of angina and heart attacks.    Also discuss risk factors for heart disease and  how to make changes to improve heart health risk factors.   Functional Fitness:  -Group instruction provided by verbal instruction, demonstration, patient participation, and written materials to support subject.  Instructors address safety measures for doing things around the house.  Discuss how to get up and down off the floor, how to pick things up properly, how to safely get out of a chair without assistance, and balance training.          CARDIAC REHAB PHASE II EXERCISE from 07/16/2015 in [place]   Date  07/16/15   Educator  [place]   Instruction Review Code  2- meets goals/outcomes      Meditation and Mindfulness:  -Group instruction provided by verbal instruction, patient participation, and written materials to support subject.  Instructor addresses importance of mindfulness and meditation practice to help reduce stress and improve awareness.  Instructor also leads participants through a meditation exercise.    Stretching for Flexibility and Mobility:  -Group instruction provided by verbal instruction, patient participation, and written materials to support subject.  Instructors lead participants through series of stretches that are designed to increase flexibility thus improving mobility.  These stretches are additional exercise for major muscle groups that are typically performed during regular warm up and cool down.      CARDIAC REHAB PHASE II EXERCISE from 07/16/2015 in [place]   Date  06/11/15   Educator  Alberteen Sam, MA, ACSM RCEP   Instruction Review Code  2- meets goals/outcomes      Hands Only CPR Anytime:  -Group instruction provided by verbal instruction, video, patient participation and written materials to support subject.  Instructors co-teach with AHA video for hands only CPR.  Participants get hands on experience with mannequins.   Nutrition I class: Heart Healthy Eating:  -Group  instruction provided by PowerPoint slides, verbal discussion, and written materials to support subject matter. The instructor gives an explanation and review of the Therapeutic Lifestyle Changes diet recommendations, which includes a discussion on lipid goals, dietary fat, sodium, fiber, plant stanol/sterol esters, sugar, and the components of a well-balanced, healthy diet.      CARDIAC REHAB PHASE II EXERCISE from 07/16/2015 in [place]   Date  06/14/15   Educator  RD   Instruction Review Code  Not applicable [Handout given]      Nutrition II class: Lifestyle Skills:  -Group instruction provided by PowerPoint slides, verbal discussion, and written materials to support subject matter. The instructor gives an explanation and review of label reading, grocery shopping for heart health, heart healthy recipe modifications, and ways to make healthier choices when eating out.      CARDIAC REHAB PHASE II EXERCISE from 07/16/2015 in [place]   Date  06/14/15   Educator  RD   Instruction Review Code  Not applicable [class handouts given]      Diabetes Question & Answer:  -Group instruction provided by PowerPoint slides, verbal discussion, and written materials to support subject matter. The instructor gives an explanation and review of diabetes co-morbidities, pre- and post-prandial blood glucose goals, pre-exercise blood glucose goals, signs, symptoms, and treatment of hypoglycemia and hyperglycemia, and foot care basics.   Diabetes Blitz:  -Group instruction provided by PowerPoint slides, verbal discussion, and written materials to support subject matter. The instructor gives an explanation and review of the physiology behind type 1 and type 2 diabetes, diabetes medications and rational behind using different medications, pre- and post-prandial blood glucose recommendations and Hemoglobin A1c goals, diabetes diet, and exercise including  blood glucose guidelines for exercising safely.    Portion Distortion:  -Group instruction provided by PowerPoint slides, verbal discussion, written materials, and food models to support subject matter. The instructor gives an explanation of serving size versus portion size, changes in portions sizes over the last 20 years, and what consists of a serving from each food group.      CARDIAC REHAB PHASE II EXERCISE from 07/16/2015 in [place]   Date  06/23/15   Educator  RD   Instruction Review Code  2- meets goals/outcomes      Stress Management:  -Group instruction provided by verbal instruction, video, and written materials to support  subject matter.  Instructors review role of stress in heart disease and how to cope with stress positively.        CARDIAC REHAB PHASE II EXERCISE from 07/16/2015 in [place]   Date  06/16/15   Instruction Review Code  2- meets goals/outcomes      Exercising on Your Own:  -Group instruction provided by verbal instruction, power point, and written materials to support subject.  Instructors discuss benefits of exercise, components of exercise, frequency and intensity of exercise, and end points for exercise.  Also discuss use of nitroglycerin and activating EMS.  Review options of places to exercise outside of rehab.  Review guidelines for sex with heart disease.   Cardiac Drugs I:  -Group instruction provided by verbal instruction and written materials to support subject.  Instructor reviews cardiac drug classes: antiplatelets, anticoagulants, beta blockers, and statins.  Instructor discusses reasons, side effects, and lifestyle considerations for each drug class.      CARDIAC REHAB PHASE II EXERCISE from 07/16/2015 in [place]   Date  06/09/15   Educator  Pharm D   Instruction Review Code  2- meets goals/outcomes      Cardiac Drugs II:  -Group instruction  provided by verbal instruction and written materials to support subject.  Instructor reviews cardiac drug classes: angiotensin converting enzyme inhibitors (ACE-I), angiotensin II receptor blockers (ARBs), nitrates, and calcium channel blockers.  Instructor discusses reasons, side effects, and lifestyle considerations for each drug class.   Anatomy and Physiology of the Circulatory System:  -Group instruction provided by verbal instruction, video, and written materials to support subject.  Reviews functional anatomy of heart, how it relates to various diagnoses, and what role the heart plays in the overall system.   Knowledge Questionnaire Score:     Knowledge Questionnaire Score - 06/03/15 1212    Knowledge Questionnaire Score   Pre Score 18/24      Core Components/Risk Factors/Patient Goals at Admission:     Personal Goals and Risk Factors at Admission - 06/03/15 0836    Core Components/Risk Factors/Patient Goals on Admission    Weight Management Obesity;Yes;Weight Loss   Intervention Obesity: Provide education and appropriate resources to help participant work on and attain dietary goals.;Weight Management/Obesity: Establish reasonable short term and long term weight goals.;Weight Management: Provide education and appropriate resources to help participant work on and attain dietary goals.;Weight Management: Develop a combined nutrition and exercise program designed to reach desired caloric intake, while maintaining appropriate intake of nutrient and fiber, sodium and fats, and appropriate energy expenditure required for the weight goal.   Admit Weight 268 lb 8.3 oz (121.8 kg)   Goal Weight: Short Term 265 lb (120.203 kg)   Goal Weight: Long Term 260 lb (117.935 kg)   Expected Outcomes Short Term: Continue to assess and modify interventions until short term weight is achieved;Long Term: Adherence to nutrition and physical activity/exercise program aimed toward attainment of established  weight goal;Weight Loss: Understanding of general recommendations for a balanced deficit meal plan, which promotes 1-2 lb weight loss per week and includes a negative energy balance of [phone] kcal/d;Understanding recommendations for meals to include 15-35% energy as protein, 25-35% energy from fat, 35-60% energy from carbohydrates, less than 200mg  of dietary cholesterol, 20-35 gm of total fiber daily;Understanding of distribution of calorie intake throughout the day with the consumption of 4-5 meals/snacks   Sedentary Yes   Intervention Provide advice, education, support and counseling about physical  activity/exercise needs.;Develop an individualized exercise prescription for aerobic and resistive training based on initial evaluation findings, risk stratification, comorbidities and participant's personal goals.   Expected Outcomes Achievement of increased cardiorespiratory fitness and enhanced flexibility, muscular endurance and strength shown through measurements of functional capacity and personal statement of participant.   Increase Strength and Stamina Yes   Intervention Provide advice, education, support and counseling about physical activity/exercise needs.;Develop an individualized exercise prescription for aerobic and resistive training based on initial evaluation findings, risk stratification, comorbidities and participant's personal goals.   Expected Outcomes Achievement of increased cardiorespiratory fitness and enhanced flexibility, muscular endurance and strength shown through measurements of functional capacity and personal statement of participant.   Tobacco Cessation Yes  quit 1970, 2 pk/yrs   Improve shortness of breath with ADL's Yes   Intervention Provide education, individualized exercise plan and daily activity instruction to help decrease symptoms of SOB with activities of daily living.   Expected Outcomes Short Term: Achieves a reduction of symptoms when performing activities of  daily living.   Heart Failure Yes   Intervention Provide a combined exercise and nutrition program that is supplemented with education, support and counseling about heart failure. Directed toward relieving symptoms such as shortness of breath, decreased exercise tolerance, and extremity edema.   Expected Outcomes Improve functional capacity of life;Short term: Attendance in program 2-3 days a week with increased exercise capacity. Reported lower sodium intake. Reported increased fruit and vegetable intake. Reports medication compliance.;Short term: Daily weights obtained and reported for increase. Utilizing diuretic protocols set by physician.;Long term: Adoption of self-care skills and reduction of barriers for early signs and symptoms recognition and intervention leading to self-care maintenance.   Hypertension Yes   Intervention Provide education on lifestyle modifcations including regular physical activity/exercise, weight management, moderate sodium restriction and increased consumption of fresh fruit, vegetables, and low fat dairy, alcohol moderation, and smoking cessation.;Monitor prescription use compliance.   Expected Outcomes Short Term: Continued assessment and intervention until BP is < 140/80mm HG in hypertensive participants. < 130/30mm HG in hypertensive participants with diabetes, heart failure or chronic kidney disease.;Long Term: Maintenance of blood pressure at goal levels.   Lipids Yes   Intervention Provide education and support for participant on nutrition & aerobic/resistive exercise along with prescribed medications to achieve LDL 70mg , HDL >40mg .   Expected Outcomes Short Term: Participant states understanding of desired cholesterol values and is compliant with medications prescribed. Participant is following exercise prescription and nutrition guidelines.;Long Term: Cholesterol controlled with medications as prescribed, with individualized exercise RX and with personalized  nutrition plan. Value goals: LDL < 70mg , HDL > 40 mg.   Stress Yes   Intervention Offer individual and/or small group education and counseling on adjustment to heart disease, stress management and health-related lifestyle change. Teach and support self-help strategies.;Refer participants experiencing significant psychosocial distress to appropriate mental health specialists for further evaluation and treatment. When possible, include family members and significant others in education/counseling sessions.   Expected Outcomes Short Term: Participant demonstrates changes in health-related behavior, relaxation and other stress management skills, ability to obtain effective social support, and compliance with psychotropic medications if prescribed.;Long Term: Emotional wellbeing is indicated by absence of clinically significant psychosocial distress or social isolation.   Personal Goal Other Yes   Personal Goal Improve diet and improve motor skills (improve balance)   Intervention Provide education on diet and exercise, diet guidelines, balance exercises   Expected Outcomes Adherance to diet and exericse guidelines      Core  Components/Risk Factors/Patient Goals Review:      Goals and Risk Factor Review      06/16/15 1044 07/16/15 1342 08/09/15 1700       Core Components/Risk Factors/Patient Goals Review   Personal Goals Review Weight Management/Obesity;Sedentary;Increase Strength and Stamina;Improve shortness of breath with ADL's;Hypertension;Other Sedentary;Increase Strength and Stamina;Improve shortness of breath with ADL's;Other Other     Review Pt is losing a little weight, has made some changes to his diet and using kettle bells at home.  Will review home ex on 4/20.  Breathing is better with ADLs, just not exercise yet.  Blood pressures are stable. Pt feels balance is better, breathing is better and feels strength and stamina is increasing with exercise. Pt attended functional fitness class for  balance exercises. Pt feels mobility and agility are better. Pt states he's eating better and breathing is slowly improving. Encouraged to walk more at home.     Expected Outcomes Conintue to improve diet and add exercise to improve cardiorespiratory fitness. Continue exercising 2 days per week in addition to 3 days per week at cardiac rehab. Exercise for a total of 4-5 days per week. Maintain balance by practicing functional fitness exercises.        Core Components/Risk Factors/Patient Goals at Discharge (Final Review):      Goals and Risk Factor Review - 08/09/15 1700    Core Components/Risk Factors/Patient Goals Review   Personal Goals Review Other   Review Pt feels mobility and agility are better. Pt states he's eating better and breathing is slowly improving. Encouraged to walk more at home.   Expected Outcomes Exercise for a total of 4-5 days per week. Maintain balance by practicing functional fitness exercises.      ITP Comments:     ITP Comments      06/03/15 0834           ITP Comments Medical Director-Dr. Fransico Him, MD          Comments: Pt is making expected progress toward personal goals after completing 31sessions. Recommend continued exercise and life style modification education including  stress management and relaxation techniques to decrease cardiac risk profile. Barnet Pall, RN,BSN 08/19/2015 6:10 PM

## 2015-08-20 ENCOUNTER — Encounter (HOSPITAL_COMMUNITY)
Admission: RE | Admit: 2015-08-20 | Discharge: 2015-08-20 | Disposition: A | Discharge status: Home / Self Care | Payer: Medicare Other | Source: Ambulatory Visit | Attending: Cardiology | Admitting: Cardiology

## 2015-08-20 DIAGNOSIS — Z79899 Other long term (current) drug therapy: Secondary | ICD-10-CM | POA: Diagnosis not present

## 2015-08-20 DIAGNOSIS — Z7901 Long term (current) use of anticoagulants: Secondary | ICD-10-CM | POA: Diagnosis not present

## 2015-08-20 DIAGNOSIS — K219 Gastro-esophageal reflux disease without esophagitis: Secondary | ICD-10-CM | POA: Diagnosis not present

## 2015-08-20 DIAGNOSIS — E669 Obesity, unspecified: Secondary | ICD-10-CM | POA: Diagnosis not present

## 2015-08-20 DIAGNOSIS — I5042 Chronic combined systolic (congestive) and diastolic (congestive) heart failure: Secondary | ICD-10-CM

## 2015-08-20 DIAGNOSIS — I130 Hypertensive heart and chronic kidney disease with heart failure and stage 1 through stage 4 chronic kidney disease, or unspecified chronic kidney disease: Secondary | ICD-10-CM | POA: Diagnosis not present

## 2015-08-23 ENCOUNTER — Encounter (HOSPITAL_COMMUNITY)
Admission: RE | Admit: 2015-08-23 | Discharge: 2015-08-23 | Disposition: A | Discharge status: Home / Self Care | Payer: Medicare Other | Source: Ambulatory Visit | Attending: Cardiology | Admitting: Cardiology

## 2015-08-23 DIAGNOSIS — I5042 Chronic combined systolic (congestive) and diastolic (congestive) heart failure: Secondary | ICD-10-CM

## 2015-08-23 DIAGNOSIS — E669 Obesity, unspecified: Secondary | ICD-10-CM | POA: Diagnosis not present

## 2015-08-23 DIAGNOSIS — Z7901 Long term (current) use of anticoagulants: Secondary | ICD-10-CM | POA: Diagnosis not present

## 2015-08-23 DIAGNOSIS — Z79899 Other long term (current) drug therapy: Secondary | ICD-10-CM | POA: Diagnosis not present

## 2015-08-23 DIAGNOSIS — I130 Hypertensive heart and chronic kidney disease with heart failure and stage 1 through stage 4 chronic kidney disease, or unspecified chronic kidney disease: Secondary | ICD-10-CM | POA: Diagnosis not present

## 2015-08-23 DIAGNOSIS — K219 Gastro-esophageal reflux disease without esophagitis: Secondary | ICD-10-CM | POA: Diagnosis not present

## 2015-08-25 ENCOUNTER — Encounter (HOSPITAL_COMMUNITY)
Admission: RE | Admit: 2015-08-25 | Discharge: 2015-08-25 | Disposition: A | Discharge status: Home / Self Care | Payer: Medicare Other | Source: Ambulatory Visit | Attending: Cardiology | Admitting: Cardiology

## 2015-08-25 DIAGNOSIS — Z7901 Long term (current) use of anticoagulants: Secondary | ICD-10-CM | POA: Diagnosis not present

## 2015-08-25 DIAGNOSIS — E669 Obesity, unspecified: Secondary | ICD-10-CM | POA: Diagnosis not present

## 2015-08-25 DIAGNOSIS — Z79899 Other long term (current) drug therapy: Secondary | ICD-10-CM | POA: Diagnosis not present

## 2015-08-25 DIAGNOSIS — I130 Hypertensive heart and chronic kidney disease with heart failure and stage 1 through stage 4 chronic kidney disease, or unspecified chronic kidney disease: Secondary | ICD-10-CM | POA: Diagnosis not present

## 2015-08-25 DIAGNOSIS — I5042 Chronic combined systolic (congestive) and diastolic (congestive) heart failure: Secondary | ICD-10-CM

## 2015-08-25 DIAGNOSIS — K219 Gastro-esophageal reflux disease without esophagitis: Secondary | ICD-10-CM | POA: Diagnosis not present

## 2015-08-27 ENCOUNTER — Encounter (HOSPITAL_COMMUNITY)
Admission: RE | Admit: 2015-08-27 | Discharge: 2015-08-27 | Disposition: A | Discharge status: Home / Self Care | Payer: Medicare Other | Source: Ambulatory Visit | Attending: Cardiology | Admitting: Cardiology

## 2015-08-27 DIAGNOSIS — I130 Hypertensive heart and chronic kidney disease with heart failure and stage 1 through stage 4 chronic kidney disease, or unspecified chronic kidney disease: Secondary | ICD-10-CM | POA: Diagnosis not present

## 2015-08-27 DIAGNOSIS — Z79899 Other long term (current) drug therapy: Secondary | ICD-10-CM | POA: Diagnosis not present

## 2015-08-27 DIAGNOSIS — E669 Obesity, unspecified: Secondary | ICD-10-CM | POA: Diagnosis not present

## 2015-08-27 DIAGNOSIS — I5042 Chronic combined systolic (congestive) and diastolic (congestive) heart failure: Secondary | ICD-10-CM | POA: Diagnosis not present

## 2015-08-27 DIAGNOSIS — Z7901 Long term (current) use of anticoagulants: Secondary | ICD-10-CM | POA: Diagnosis not present

## 2015-08-27 DIAGNOSIS — K219 Gastro-esophageal reflux disease without esophagitis: Secondary | ICD-10-CM | POA: Diagnosis not present

## 2015-08-30 ENCOUNTER — Encounter (HOSPITAL_COMMUNITY)
Admission: RE | Admit: 2015-08-30 | Discharge: 2015-08-30 | Disposition: A | Discharge status: Home / Self Care | Payer: Medicare Other | Source: Ambulatory Visit | Attending: Cardiology | Admitting: Cardiology

## 2015-08-30 VITALS — BP 126/84 | HR 64 | Ht 73.0 in | Wt 265.9 lb

## 2015-08-30 DIAGNOSIS — I2510 Atherosclerotic heart disease of native coronary artery without angina pectoris: Secondary | ICD-10-CM | POA: Insufficient documentation

## 2015-08-30 DIAGNOSIS — C61 Malignant neoplasm of prostate: Secondary | ICD-10-CM | POA: Insufficient documentation

## 2015-08-30 DIAGNOSIS — Z7901 Long term (current) use of anticoagulants: Secondary | ICD-10-CM | POA: Diagnosis not present

## 2015-08-30 DIAGNOSIS — K219 Gastro-esophageal reflux disease without esophagitis: Secondary | ICD-10-CM | POA: Diagnosis not present

## 2015-08-30 DIAGNOSIS — E669 Obesity, unspecified: Secondary | ICD-10-CM | POA: Insufficient documentation

## 2015-08-30 DIAGNOSIS — Z6835 Body mass index (BMI) 35.0-35.9, adult: Secondary | ICD-10-CM | POA: Insufficient documentation

## 2015-08-30 DIAGNOSIS — I428 Other cardiomyopathies: Secondary | ICD-10-CM | POA: Diagnosis not present

## 2015-08-30 DIAGNOSIS — N189 Chronic kidney disease, unspecified: Secondary | ICD-10-CM | POA: Diagnosis not present

## 2015-08-30 DIAGNOSIS — Z9889 Other specified postprocedural states: Secondary | ICD-10-CM | POA: Diagnosis not present

## 2015-08-30 DIAGNOSIS — Z79899 Other long term (current) drug therapy: Secondary | ICD-10-CM | POA: Insufficient documentation

## 2015-08-30 DIAGNOSIS — I130 Hypertensive heart and chronic kidney disease with heart failure and stage 1 through stage 4 chronic kidney disease, or unspecified chronic kidney disease: Secondary | ICD-10-CM | POA: Diagnosis not present

## 2015-08-30 DIAGNOSIS — I5042 Chronic combined systolic (congestive) and diastolic (congestive) heart failure: Secondary | ICD-10-CM | POA: Insufficient documentation

## 2015-08-30 DIAGNOSIS — I4891 Unspecified atrial fibrillation: Secondary | ICD-10-CM | POA: Diagnosis not present

## 2015-08-30 DIAGNOSIS — E785 Hyperlipidemia, unspecified: Secondary | ICD-10-CM | POA: Insufficient documentation

## 2015-08-30 NOTE — Progress Notes (Signed)
Discharge Summary  Patient Details  Name: Jose Snyder MRN: [id number] Date of Birth: [birth year] Referring Provider:        CARDIAC REHAB PHASE II ORIENTATION from 06/03/2015 in [place]   Referring Provider  Kirk Ruths MD       Number of Visits: 36  Reason for Discharge:  Patient reached a stable level of exercise.  Smoking History:  History  Smoking status  . Never Smoker   Smokeless tobacco  . Never Used    Comment: 06/25/2013 "smoked very light; years and years ago"    Diagnosis:  Chronic combined systolic and diastolic congestive heart failure ([place])  ADL [facility]:   Initial Exercise Prescription:     Initial Exercise Prescription - 06/10/15 1300    Date of Initial Exercise RX and Referring Provider   Date 06/03/15   Referring Provider Kirk Ruths MD   Recumbant Bike   Level 1   RPM 30   Watts 5   Minutes 10   METs 1.4   T5 Nustep   Level 1   Minutes 10   METs 1.5   Track   Laps 5   Minutes 10   METs 0.84   Prescription Details   Frequency (times per week) 3   Intensity   THRR 40-80% of Max Heartrate 58-117   Ratings of Perceived Exertion 11-13   Perceived Dyspnea 0-4   Progression   Progression Continue to progress workloads to maintain intensity without signs/symptoms of physical distress.   Resistance Training   Training Prescription Yes   Weight 2 lbs   Reps 10-12      Discharge Exercise Prescription (Final Exercise Prescription Changes):     Exercise Prescription Changes - 08/23/15 1600    Response to Exercise   Blood Pressure (Admit) 122/80 mmHg   Blood Pressure (Exercise) 120/60 mmHg   Blood Pressure (Exit) 140/56 mmHg   Heart Rate (Admit) 66 bpm   Heart Rate (Exercise) 101 bpm   Heart Rate (Exit) 73 bpm   Rating of Perceived Exertion (Exercise) 11   Symptoms none   Comments Home Exercise Given 06/18/15   Duration Progress to 30 minutes of continuous aerobic without signs/symptoms of physical  distress   Intensity THRR unchanged   Progression   Progression Continue to progress workloads to maintain intensity without signs/symptoms of physical distress.   Average METs 3   Resistance Training   Training Prescription Yes   Weight 4 lbs   Reps 10-12   Interval Training   Interval Training No   Recumbant Bike   Level 4   Minutes 10   METs 3.8   Arm Ergometer   Level 3   Watts 18   Minutes 10   METs 1.77   T5 Nustep   Level 3   Minutes 10   METs 3.5   Home Exercise Plan   Plans to continue exercise at Home  Walking and Kettlebells   Frequency Add 2 additional days to program exercise sessions.      Functional Capacity:     6 Minute Walk      06/03/15 0800       6 Minute Walk   Phase Initial     Distance 688 feet     Walk Time 5.23 minutes     # of Rest Breaks 1     MPH 1.49     METS 1.36     RPE 13     Perceived  Dyspnea  2  95%     VO2 Peak 4.79     Symptoms Yes (comment)     Comments Shortness of breath     Resting HR 84 bpm     Resting BP 136/74 mmHg     Max Ex. HR 113 bpm     Max Ex. BP 140/80 mmHg     2 Minute Post BP 122/62 mmHg        Psychological, QOL, Others - Outcomes: PHQ 2/9: Depression screen [facility] 2/9 08/30/2015 06/07/2015  Decreased Interest 0 0  Down, Depressed, Hopeless 0 0  PHQ - 2 Score 0 0    Quality of Life:     Quality of Life - 06/03/15 1214    Quality of Life Scores   Health/Function Pre 25.43 %   Socioeconomic Pre 28.21 %   Psych/Spiritual Pre 28.29 %   Family Pre 27 %   GLOBAL Pre 26.85 %      Personal Goals: Goals established at orientation with interventions provided to work toward goal.     Personal Goals and Risk Factors at Admission - 06/03/15 0836    Core Components/Risk Factors/Patient Goals on Admission    Weight Management Obesity;Yes;Weight Loss   Intervention Obesity: Provide education and appropriate resources to help participant work on and attain dietary goals.;Weight Management/Obesity:  Establish reasonable short term and long term weight goals.;Weight Management: Provide education and appropriate resources to help participant work on and attain dietary goals.;Weight Management: Develop a combined nutrition and exercise program designed to reach desired caloric intake, while maintaining appropriate intake of nutrient and fiber, sodium and fats, and appropriate energy expenditure required for the weight goal.   Admit Weight 268 lb 8.3 oz (121.8 kg)   Goal Weight: Short Term 265 lb (120.203 kg)   Goal Weight: Long Term 260 lb (117.935 kg)   Expected Outcomes Short Term: Continue to assess and modify interventions until short term weight is achieved;Long Term: Adherence to nutrition and physical activity/exercise program aimed toward attainment of established weight goal;Weight Loss: Understanding of general recommendations for a balanced deficit meal plan, which promotes 1-2 lb weight loss per week and includes a negative energy balance of [phone] kcal/d;Understanding recommendations for meals to include 15-35% energy as protein, 25-35% energy from fat, 35-60% energy from carbohydrates, less than 24m of dietary cholesterol, 20-35 gm of total fiber daily;Understanding of distribution of calorie intake throughout the day with the consumption of 4-5 meals/snacks   Sedentary Yes   Intervention Provide advice, education, support and counseling about physical activity/exercise needs.;Develop an individualized exercise prescription for aerobic and resistive training based on initial evaluation findings, risk stratification, comorbidities and participant's personal goals.   Expected Outcomes Achievement of increased cardiorespiratory fitness and enhanced flexibility, muscular endurance and strength shown through measurements of functional capacity and personal statement of participant.   Increase Strength and Stamina Yes   Intervention Provide advice, education, support and counseling about  physical activity/exercise needs.;Develop an individualized exercise prescription for aerobic and resistive training based on initial evaluation findings, risk stratification, comorbidities and participant's personal goals.   Expected Outcomes Achievement of increased cardiorespiratory fitness and enhanced flexibility, muscular endurance and strength shown through measurements of functional capacity and personal statement of participant.   Tobacco Cessation Yes  quit 1970, 2 pk/yrs   Improve shortness of breath with ADL's Yes   Intervention Provide education, individualized exercise plan and daily activity instruction to help decrease symptoms of SOB with activities of daily living.  Expected Outcomes Short Term: Achieves a reduction of symptoms when performing activities of daily living.   Heart Failure Yes   Intervention Provide a combined exercise and nutrition program that is supplemented with education, support and counseling about heart failure. Directed toward relieving symptoms such as shortness of breath, decreased exercise tolerance, and extremity edema.   Expected Outcomes Improve functional capacity of life;Short term: Attendance in program 2-3 days a week with increased exercise capacity. Reported lower sodium intake. Reported increased fruit and vegetable intake. Reports medication compliance.;Short term: Daily weights obtained and reported for increase. Utilizing diuretic protocols set by physician.;Long term: Adoption of self-care skills and reduction of barriers for early signs and symptoms recognition and intervention leading to self-care maintenance.   Hypertension Yes   Intervention Provide education on lifestyle modifcations including regular physical activity/exercise, weight management, moderate sodium restriction and increased consumption of fresh fruit, vegetables, and low fat dairy, alcohol moderation, and smoking cessation.;Monitor prescription use compliance.   Expected  Outcomes Short Term: Continued assessment and intervention until BP is < 140/64m HG in hypertensive participants. < 130/819mHG in hypertensive participants with diabetes, heart failure or chronic kidney disease.;Long Term: Maintenance of blood pressure at goal levels.   Lipids Yes   Intervention Provide education and support for participant on nutrition & aerobic/resistive exercise along with prescribed medications to achieve LDL <7035mHDL >80m56m Expected Outcomes Short Term: Participant states understanding of desired cholesterol values and is compliant with medications prescribed. Participant is following exercise prescription and nutrition guidelines.;Long Term: Cholesterol controlled with medications as prescribed, with individualized exercise RX and with personalized nutrition plan. Value goals: LDL < 70mg15mL > 40 mg.   Stress Yes   Intervention Offer individual and/or small group education and counseling on adjustment to heart disease, stress management and health-related lifestyle change. Teach and support self-help strategies.;Refer participants experiencing significant psychosocial distress to appropriate mental health specialists for further evaluation and treatment. When possible, include family members and significant others in education/counseling sessions.   Expected Outcomes Short Term: Participant demonstrates changes in health-related behavior, relaxation and other stress management skills, ability to obtain effective social support, and compliance with psychotropic medications if prescribed.;Long Term: Emotional wellbeing is indicated by absence of clinically significant psychosocial distress or social isolation.   Personal Goal Other Yes   Personal Goal Improve diet and improve motor skills (improve balance)   Intervention Provide education on diet and exercise, diet guidelines, balance exercises   Expected Outcomes Adherance to diet and exericse guidelines       Personal  Goals Discharge:     Goals and Risk Factor Review      06/16/15 1044 07/16/15 1342 08/09/15 1700       Core Components/Risk Factors/Patient Goals Review   Personal Goals Review Weight Management/Obesity;Sedentary;Increase Strength and Stamina;Improve shortness of breath with ADL's;Hypertension;Other Sedentary;Increase Strength and Stamina;Improve shortness of breath with ADL's;Other Other     Review Pt is losing a little weight, has made some changes to his diet and using kettle bells at home.  Will review home ex on 4/20.  Breathing is better with ADLs, just not exercise yet.  Blood pressures are stable. Pt feels balance is better, breathing is better and feels strength and stamina is increasing with exercise. Pt attended functional fitness class for balance exercises. Pt feels mobility and agility are better. Pt states he's eating better and breathing is slowly improving. Encouraged to walk more at home.     Expected Outcomes Conintue to improve  diet and add exercise to improve cardiorespiratory fitness. Continue exercising 2 days per week in addition to 3 days per week at cardiac rehab. Exercise for a total of 4-5 days per week. Maintain balance by practicing functional fitness exercises.        Nutrition & Weight - Outcomes:     Pre Biometrics - 06/03/15 1213    Pre Biometrics   Height _0  (1.854 m)   Weight 268 lb 8.3 oz (121.8 kg)   Waist Circumference 46 inches   Hip Circumference 46 inches   Waist to Hip Ratio 1 %   BMI (Calculated) 35.5   Triceps Skinfold 25 mm   % Body Fat 34.9 %   Grip Strength 30 kg   Flexibility 0 in   Single Leg Stand 2.21 seconds       Nutrition:     Nutrition Therapy & Goals - 06/14/15 1100    Nutrition Therapy   Diet Therapeutic Lifestyle Changes   Personal Nutrition Goals   Personal Goal #1 0.5-2 lb weight loss per week to a goal wt loss of 6-24 lb at graduation from [place], educate  and counsel regarding individualized specific dietary modifications aiming towards targeted core components such as weight, hypertension, lipid management, diabetes, heart failure and other comorbidities.;Nutrition handout(s) given to patient.  Handouts for Nutrition I & II classes, 5 day menu ideas, and a list of meal preparation companys that cater to special dietary needs   Expected Outcomes Short Term Goal: Understand basic principles of dietary content, such as calories, fat, sodium, cholesterol and nutrients.;Long Term Goal: Adherence to prescribed nutrition plan.      Nutrition Discharge:     Nutrition Assessments - 06/03/15 1457    MEDFICTS Scores   Pre Score 78      Education Questionnaire Score:     Knowledge Questionnaire Score - 06/03/15 1212    Knowledge Questionnaire Score   Pre Score 18/24      Goals reviewed with patient; copy given to patient.Pt graduated from cardiac rehab program today with completion of 36 exercise sessions in Phase II. Pt maintained good attendance and progressed nicely during his participation in rehab as evidenced by increased MET level.   Medication list reconciled. Repeat  PHQ score-0  .  Pt has made significant lifestyle changes and should be commended for his success. Pt feels he has achieved his goals during cardiac rehab.   Pt plans to continue exercise at [employer] or the [facility].Barnet Pall, RN,BSN 08/30/2015 5:51 PM

## 2015-09-01 ENCOUNTER — Encounter (HOSPITAL_COMMUNITY): Payer: Medicare Other

## 2015-09-03 ENCOUNTER — Encounter (HOSPITAL_COMMUNITY): Payer: Medicare Other

## 2015-09-06 ENCOUNTER — Encounter (HOSPITAL_COMMUNITY): Payer: Medicare Other

## 2015-09-08 ENCOUNTER — Encounter (HOSPITAL_COMMUNITY): Payer: Medicare Other

## 2015-09-10 ENCOUNTER — Encounter (HOSPITAL_COMMUNITY): Payer: Medicare Other

## 2015-09-29 DIAGNOSIS — C61 Malignant neoplasm of prostate: Secondary | ICD-10-CM | POA: Diagnosis not present

## 2015-10-06 DIAGNOSIS — C61 Malignant neoplasm of prostate: Secondary | ICD-10-CM | POA: Diagnosis not present

## 2015-10-06 DIAGNOSIS — N5201 Erectile dysfunction due to arterial insufficiency: Secondary | ICD-10-CM | POA: Diagnosis not present

## 2015-10-06 DIAGNOSIS — R3912 Poor urinary stream: Secondary | ICD-10-CM | POA: Diagnosis not present

## 2015-10-06 DIAGNOSIS — N481 Balanitis: Secondary | ICD-10-CM | POA: Diagnosis not present

## 2015-10-06 DIAGNOSIS — N401 Enlarged prostate with lower urinary tract symptoms: Secondary | ICD-10-CM | POA: Diagnosis not present

## 2015-10-13 ENCOUNTER — Encounter: Payer: Self-pay | Admitting: Podiatry

## 2015-10-13 ENCOUNTER — Ambulatory Visit (INDEPENDENT_AMBULATORY_CARE_PROVIDER_SITE_OTHER): Payer: Medicare Other | Admitting: Podiatry

## 2015-10-13 DIAGNOSIS — B351 Tinea unguium: Secondary | ICD-10-CM

## 2015-10-13 DIAGNOSIS — M79673 Pain in unspecified foot: Secondary | ICD-10-CM

## 2015-10-13 NOTE — Progress Notes (Signed)
Patient ID: Jose Snyder, male   DOB: 1940/08/11, 75 y.o.   MRN: [id number] Complaint:  Visit Type: Patient returns to my office for continued preventative foot care services. Complaint: Patient states" my nails have grown long and thick and become painful to walk and wear shoes. The patient presents for preventative foot care services. No changes to ROS  Podiatric Exam: Vascular: dorsalis pedis and posterior tibial pulses are palpable bilateral. Capillary return is immediate. Temperature gradient is WNL. Skin turgor WNL  Sensorium: Normal Semmes Weinstein monofilament test. Normal tactile sensation bilaterally. Nail Exam: Pt has thick disfigured discolored nails with subungual debris noted bilateral entire nail hallux through fifth toenails Ulcer Exam: There is no evidence of ulcer or pre-ulcerative changes or infection. Orthopedic Exam: Muscle tone and strength are WNL. No limitations in general ROM. No crepitus or effusions noted. Foot type and digits show no abnormalities. Bony prominences are unremarkable. Skin: No Porokeratosis. No infection or ulcers  Diagnosis:  Onychomycosis, , Pain in right toe, pain in left toes  Treatment & Plan Procedures and Treatment: Consent by patient was obtained for treatment procedures. The patient understood the discussion of treatment and procedures well. All questions were answered thoroughly reviewed. Debridement of mycotic and hypertrophic toenails, 1 through 5 bilateral and clearing of subungual debris. No ulceration, no infection noted.  Return Visit-Office Procedure: Patient instructed to return to the office for a follow up visit 3 months for continued evaluation and treatment.  [name] DPM

## 2015-10-21 ENCOUNTER — Encounter: Payer: Self-pay | Admitting: Cardiology

## 2015-10-30 NOTE — Progress Notes (Signed)
HPI: FU atrial fibrillation. Echo 4/15 EF 20-25%. LHC demonstrated mod non-obs CAD. Most significant lesion was a mid D1 with 75% stenosis. DCM was felt to be out of proportion to CAD (NICM). Had DCCV on amiodarone but did not hold sinus. He was seen by Dr. Rayann Heman and referred to Dr. Roxy Manns for surgical maze. Carotid Dopplers August 2015 showed 1-39% bilateral stenosis. CTA August 2015 showed possible small focal dissection or penetrating ulcer in the descending thoracic aorta. No evidence of abdominal aortic aneurysm. Patient underwent maze procedure with clipping of left atrial appendage on October 29, 2013. Echocardiogram repeated December 2015 and showed improvement in LV function. Ejection fraction 50-55% and mild left atrial enlargement. Chest CT April 2017 showed unchanged penetrating atherosclerotic ulcer. This is followed by Dr. Roxy Manns. Since last seen, the patient has dyspnea with more extreme activities but not with routine activities. It is relieved with rest. It is not associated with chest pain. There is no orthopnea, PND or pedal edema. There is no syncope or palpitations. There is no exertional chest pain.   Current Outpatient Prescriptions  Medication Sig Dispense Refill  . apixaban (ELIQUIS) 5 MG TABS tablet Take 1 tablet (5 mg total) by mouth 2 (two) times daily. 60 tablet 11  . atorvastatin (LIPITOR) 80 MG tablet Take 1 tablet (80 mg total) by mouth daily with breakfast. 30 tablet 12  . betamethasone dipropionate (DIPROLENE) 0.05 % ointment Apply topically 2 (two) times daily.    . Cholecalciferol (VITAMIN D) 2000 UNITS tablet Take 2,000 Units by mouth daily.    Marland Kitchen docusate sodium (COLACE) 100 MG capsule Take 100 mg by mouth daily as needed for mild constipation.    . fluticasone (FLONASE) 50 MCG/ACT nasal spray Place 1 spray into both nostrils daily as needed for allergies or rhinitis.     . furosemide (LASIX) 40 MG tablet Take 1 tablet (40 mg total) by mouth 2 (two) times  daily. If swelling takes an extra dose 60 tablet 6  . ibuprofen (ADVIL,MOTRIN) 200 MG tablet Take 400 mg by mouth daily. Reported on 08/30/2015    . lisinopril (PRINIVIL,ZESTRIL) 20 MG tablet Take 1 tablet (20 mg total) by mouth daily with breakfast. 30 tablet 12  . metoprolol succinate (TOPROL-XL) 100 MG 24 hr tablet Take 1 tablet (100 mg total) by mouth daily. 30 tablet 12  . omeprazole (PRILOSEC) 20 MG capsule Take 20 mg by mouth daily.    . potassium chloride SA (K-DUR,KLOR-CON) 20 MEQ tablet Take 1 tablet (20 mEq total) by mouth daily. 30 tablet 11  . temazepam (RESTORIL) 15 MG capsule Take 15 mg by mouth at bedtime as needed.      No current facility-administered medications for this visit.      Past Medical History:  Diagnosis Date  . Anxiety   . Arthritis    R hand- OA  . Atrial Fibrillation  06/25/2013  . CAD (coronary artery disease), native coronary artery 06/28/2013   a. LHC (06/27/13):  Mid LAD 40%, mid D1 75%, mid CFX 50-60%, mid RCA 50%, EF 20%, LVEDP 24 mmHg.  . CHF (congestive heart failure) ([place])   . Chronic combined systolic and diastolic heart failure ([place]) 06/26/2013   a.  Echo (06/25/13):  EF 20-25%, diff HK, restrictive physio, mild MR, mod to severe LAE, mild RVE, mildly reduced RVSF, mod to severe RAE  . Chronic kidney disease   . Dyslipidemia 07/15/2013  . Dysrhythmia    a-fib  .  GERD (gastroesophageal reflux disease)   . Hypertension   . Incidental pulmonary nodule 06/22/2014   Seen on CT angiogram of chest  . NICM (nonischemic cardiomyopathy) with EF 20-25% 06/26/2013  . Obesity   . Penetrating atherosclerotic ulcer of aorta ([place]) 10/22/2013   Small penetrating ulcer of descending thoracic aorta discovered on routine CTA  . Prostate cancer ([place])    watchful waiting, no treatment so far  . S/P Maze operation for atrial fibrillation 10/29/2013   Complete bilateral atrial lesion set using cryothermy and bipolar radiofrequency ablation with clipping of LA appendage via  right mini thoracotomy  . Shortness of breath    seen by Stanford Breed, & low energy     Past Surgical History:  Procedure Laterality Date  . CARDIAC CATHETERIZATION  06/2013  . CARDIOVERSION N/A 06/30/2013   Procedure: CARDIOVERSION;  Surgeon: [name], MD;  Location: [facility];  Service: Cardiovascular;  Laterality: N/A;  . CARDIOVERSION N/A 07/31/2013   Procedure: CARDIOVERSION;  Surgeon: Thayer Headings, MD;  Location: [facility];  Service: Cardiovascular;  Laterality: N/A;  . CLIPPING OF ATRIAL APPENDAGE  10/29/2013   Procedure: CLIPPING OF ATRIAL APPENDAGE;  Surgeon: Rexene Alberts, MD;  Location: [facility];  Service: Open Heart Surgery;;  . INTRAOPERATIVE TRANSESOPHAGEAL ECHOCARDIOGRAM N/A 10/29/2013   Procedure: INTRAOPERATIVE TRANSESOPHAGEAL ECHOCARDIOGRAM;  Surgeon: Rexene Alberts, MD;  Location: [facility];  Service: Open Heart Surgery;  Laterality: N/A;  . LEFT AND RIGHT HEART CATHETERIZATION WITH CORONARY ANGIOGRAM N/A 06/27/2013   Procedure: LEFT AND RIGHT HEART CATHETERIZATION WITH CORONARY ANGIOGRAM;  Surgeon: Jettie Booze, MD;  Location: [facility];  Service: Cardiovascular;  Laterality: N/A;  . MEDIAL PARTIAL KNEE REPLACEMENT Left 1990's?  Marland Kitchen MINIMALLY INVASIVE MAZE PROCEDURE N/A 10/29/2013   Procedure: MINIMALLY INVASIVE MAZE PROCEDURE;  Surgeon: Rexene Alberts, MD;  Location: [facility];  Service: Open Heart Surgery;  Laterality: N/A;  . TEE WITHOUT CARDIOVERSION N/A 06/30/2013   Procedure: TRANSESOPHAGEAL ECHOCARDIOGRAM (TEE) ;  Surgeon: [name], MD;  Location: [facility];  Service: Cardiovascular;  Laterality: N/A;  . TRANSURETHRAL RESECTION OF PROSTATE  2010    Social History   Social History  . Marital status: Widowed    Spouse name: N/A  . Number of children: N/A  . Years of education: N/A   Occupational History  . Not on file.   Social History Main Topics  . Smoking status: Never Smoker  . Smokeless tobacco: Never Used     Comment: 06/25/2013 "smoked very  light; years and years ago"  . Alcohol use 8.4 oz/week    7 Glasses of wine, 7 Shots of liquor per week  . Drug use: No  . Sexual activity: Not Currently   Other Topics Concern  . Not on file   Social History Narrative   He lives in [place]. He is widowed. Wife passed of breast cancer last year. No children.     Family History  Problem Relation Age of Onset  . Healthy Father   . Healthy Mother   . Healthy Sister     ROS: no fevers or chills, productive cough, hemoptysis, dysphasia, odynophagia, melena, hematochezia, dysuria, hematuria, rash, seizure activity, orthopnea, PND, pedal edema, claudication. Remaining systems are negative.  Physical Exam: Well-developed well-nourished in no acute distress.  Skin is warm and dry.  HEENT is normal.  Neck is supple.  Chest is clear to auscultation with normal expansion.  Cardiovascular exam is regular rate and rhythm.  Abdominal  exam nontender or distended. No masses palpated. Extremities show no edema. neuro grossly intact  ECG- Sinus rhythm at a rate of 63. Normal axis. No ST changes.  A/P  1 Paroxysmal atrial fibrillation-patient remains in sinus rhythm. Continue beta blocker and apixaban. Check hemoglobin and renal function.   2 coronary artery disease-Continue statin. No aspirin given need for anticoagulation.  3 Chronic Diastolic congestive heart failure-patient is euvolemic. Continue present dose of Lasix. Check potassium and renal function.  4 hyperlipidemia-continue statin. Check lipids and liver.  5 hypertension-blood pressure is mildly elevated. However he states it is controlled at home. Follow and adjust regimen as needed.  6 nonischemic cardio myopathy-normalized on most recent echocardiogram. Continue ACE inhibitor and beta blocker.  7 penetrating atherosclerotic aortic ulcer-followed by Dr. Roxy Manns.  Kirk Ruths, MD

## 2015-11-03 ENCOUNTER — Encounter: Payer: Self-pay | Admitting: Cardiology

## 2015-11-03 ENCOUNTER — Ambulatory Visit (INDEPENDENT_AMBULATORY_CARE_PROVIDER_SITE_OTHER): Payer: Medicare Other | Admitting: Cardiology

## 2015-11-03 VITALS — BP 157/80 | HR 63 | Ht 73.0 in | Wt 270.0 lb

## 2015-11-03 DIAGNOSIS — Z79899 Other long term (current) drug therapy: Secondary | ICD-10-CM | POA: Diagnosis not present

## 2015-11-03 DIAGNOSIS — I481 Persistent atrial fibrillation: Secondary | ICD-10-CM

## 2015-11-03 DIAGNOSIS — I10 Essential (primary) hypertension: Secondary | ICD-10-CM

## 2015-11-03 DIAGNOSIS — I4819 Other persistent atrial fibrillation: Secondary | ICD-10-CM

## 2015-11-03 DIAGNOSIS — E785 Hyperlipidemia, unspecified: Secondary | ICD-10-CM | POA: Diagnosis not present

## 2015-11-03 DIAGNOSIS — I2510 Atherosclerotic heart disease of native coronary artery without angina pectoris: Secondary | ICD-10-CM | POA: Diagnosis not present

## 2015-11-03 NOTE — Patient Instructions (Addendum)
Your physician recommends that you continue on your current medications as directed. Please refer to the Current Medication list given to you today.   Your physician wants you to follow-up in: [place] will receive a reminder letter in the mail two months in advance. If you don't receive a letter, please call our office to schedule the follow-up appointment.  Your physician recommends that you return for lab work in: FASTING CBC BMET  LIPID LIVER

## 2015-11-16 ENCOUNTER — Other Ambulatory Visit: Payer: Self-pay | Admitting: Cardiology

## 2015-11-16 DIAGNOSIS — Z79899 Other long term (current) drug therapy: Secondary | ICD-10-CM | POA: Diagnosis not present

## 2015-11-16 DIAGNOSIS — E785 Hyperlipidemia, unspecified: Secondary | ICD-10-CM | POA: Diagnosis not present

## 2015-11-16 DIAGNOSIS — R609 Edema, unspecified: Secondary | ICD-10-CM | POA: Diagnosis not present

## 2015-11-16 LAB — CBC WITH DIFFERENTIAL/PLATELET
Basophils Absolute: 0 cells/uL (ref 0–200)
Basophils Relative: 0 %
EOS PCT: 1 %
Eosinophils Absolute: 49 cells/uL (ref 15–500)
HCT: 38.1 % — ABNORMAL LOW (ref 38.5–50.0)
Hemoglobin: 12.9 g/dL — ABNORMAL LOW (ref 13.2–17.1)
LYMPHS ABS: 882 {cells}/uL (ref 850–3900)
LYMPHS PCT: 18 %
MCH: 30.4 pg (ref 27.0–33.0)
MCHC: 33.9 g/dL (ref 32.0–36.0)
MCV: 89.9 fL (ref 80.0–100.0)
MONOS PCT: 7 %
MPV: 11.3 fL (ref 7.5–12.5)
Monocytes Absolute: 343 cells/uL (ref 200–950)
NEUTROS PCT: 74 %
Neutro Abs: 3626 cells/uL (ref 1500–7800)
PLATELETS: 197 10*3/uL (ref 140–400)
RBC: 4.24 MIL/uL (ref 4.20–5.80)
RDW: 13.6 % (ref 11.0–15.0)
WBC: 4.9 10*3/uL (ref 3.8–10.8)

## 2015-11-16 LAB — BASIC METABOLIC PANEL
BUN: 17 mg/dL (ref 7–25)
CALCIUM: 9.2 mg/dL (ref 8.6–10.3)
CO2: 26 mmol/L (ref 20–31)
Chloride: 103 mmol/L (ref 98–110)
Creat: 1.42 mg/dL — ABNORMAL HIGH (ref 0.70–1.18)
Glucose, Bld: 113 mg/dL — ABNORMAL HIGH (ref 65–99)
POTASSIUM: 4.7 mmol/L (ref 3.5–5.3)
SODIUM: 137 mmol/L (ref 135–146)

## 2015-11-16 LAB — LIPID PANEL
CHOL/HDL RATIO: 3.4 ratio (ref <=5.0)
CHOLESTEROL: 114 mg/dL — AB (ref 125–200)
HDL: 34 mg/dL — ABNORMAL LOW (ref >=40)
LDL Cholesterol: 44 mg/dL (ref <130)
TRIGLYCERIDES: 178 mg/dL — AB (ref <150)
VLDL: 36 mg/dL — AB (ref <30)

## 2015-11-16 LAB — HEPATIC FUNCTION PANEL
ALT: 14 U/L (ref 9–46)
AST: 17 U/L (ref 10–35)
Albumin: 4.2 g/dL (ref 3.6–5.1)
Alkaline Phosphatase: 73 U/L (ref 40–115)
Bilirubin, Direct: 0.1 mg/dL (ref <=0.2)
Indirect Bilirubin: 0.5 mg/dL (ref 0.2–1.2)
TOTAL PROTEIN: 6.7 g/dL (ref 6.1–8.1)
Total Bilirubin: 0.6 mg/dL (ref 0.2–1.2)

## 2016-01-05 ENCOUNTER — Encounter: Payer: Self-pay | Admitting: Podiatry

## 2016-01-05 ENCOUNTER — Ambulatory Visit (INDEPENDENT_AMBULATORY_CARE_PROVIDER_SITE_OTHER): Payer: Medicare Other | Admitting: Podiatry

## 2016-01-05 VITALS — Ht 72.0 in | Wt 270.0 lb

## 2016-01-05 DIAGNOSIS — M79676 Pain in unspecified toe(s): Secondary | ICD-10-CM

## 2016-01-05 DIAGNOSIS — B351 Tinea unguium: Secondary | ICD-10-CM | POA: Diagnosis not present

## 2016-01-05 NOTE — Progress Notes (Signed)
Patient ID: Jose Snyder, male   DOB: 27-Jun-1940, 75 y.o.   MRN: [id number] Complaint:  Visit Type: Patient returns to my office for continued preventative foot care services. Complaint: Patient states" my nails have grown long and thick and become painful to walk and wear shoes. The patient presents for preventative foot care services. No changes to ROS  Podiatric Exam: Vascular: dorsalis pedis and posterior tibial pulses are palpable bilateral. Capillary return is immediate. Temperature gradient is WNL. Skin turgor WNL  Sensorium: Normal Semmes Weinstein monofilament test. Normal tactile sensation bilaterally. Nail Exam: Pt has thick disfigured discolored nails with subungual debris noted bilateral entire nail hallux through fifth toenails Ulcer Exam: There is no evidence of ulcer or pre-ulcerative changes or infection. Orthopedic Exam: Muscle tone and strength are WNL. No limitations in general ROM. No crepitus or effusions noted. Foot type and digits show no abnormalities. Bony prominences are unremarkable. Skin: No Porokeratosis. No infection or ulcers  Diagnosis:  Onychomycosis, , Pain in right toe, pain in left toes  Treatment & Plan Procedures and Treatment: Consent by patient was obtained for treatment procedures. The patient understood the discussion of treatment and procedures well. All questions were answered thoroughly reviewed. Debridement of mycotic and hypertrophic toenails, 1 through 5 bilateral and clearing of subungual debris. No ulceration, no infection noted.  Return Visit-Office Procedure: Patient instructed to return to the office for a follow up visit 3 months for continued evaluation and treatment.  [name] DPM

## 2016-03-31 DIAGNOSIS — N5201 Erectile dysfunction due to arterial insufficiency: Secondary | ICD-10-CM | POA: Diagnosis not present

## 2016-03-31 DIAGNOSIS — N481 Balanitis: Secondary | ICD-10-CM | POA: Diagnosis not present

## 2016-03-31 DIAGNOSIS — R3912 Poor urinary stream: Secondary | ICD-10-CM | POA: Diagnosis not present

## 2016-03-31 DIAGNOSIS — C61 Malignant neoplasm of prostate: Secondary | ICD-10-CM | POA: Diagnosis not present

## 2016-03-31 DIAGNOSIS — N401 Enlarged prostate with lower urinary tract symptoms: Secondary | ICD-10-CM | POA: Diagnosis not present

## 2016-04-05 ENCOUNTER — Ambulatory Visit (INDEPENDENT_AMBULATORY_CARE_PROVIDER_SITE_OTHER): Payer: Medicare Other | Admitting: Podiatry

## 2016-04-05 ENCOUNTER — Encounter: Payer: Self-pay | Admitting: Podiatry

## 2016-04-05 VITALS — Ht 72.0 in | Wt 270.0 lb

## 2016-04-05 DIAGNOSIS — B351 Tinea unguium: Secondary | ICD-10-CM

## 2016-04-05 DIAGNOSIS — M79676 Pain in unspecified toe(s): Secondary | ICD-10-CM

## 2016-04-05 NOTE — Progress Notes (Signed)
Patient ID: Jose Snyder, male   DOB: 08-18-1940, 76 y.o.   MRN: [id number] Complaint:  Visit Type: Patient returns to my office for continued preventative foot care services. Complaint: Patient states" my nails have grown long and thick and become painful to walk and wear shoes. The patient presents for preventative foot care services. No changes to ROS  Podiatric Exam: Vascular: dorsalis pedis and posterior tibial pulses are palpable bilateral. Capillary return is immediate. Temperature gradient is WNL. Skin turgor WNL  Sensorium: Normal Semmes Weinstein monofilament test. Normal tactile sensation bilaterally. Nail Exam: Pt has thick disfigured discolored nails with subungual debris noted bilateral entire nail hallux through fifth toenails Ulcer Exam: There is no evidence of ulcer or pre-ulcerative changes or infection. Orthopedic Exam: Muscle tone and strength are WNL. No limitations in general ROM. No crepitus or effusions noted. Foot type and digits show no abnormalities. Bony prominences are unremarkable. Skin: No Porokeratosis. No infection or ulcers  Diagnosis:  Onychomycosis, , Pain in right toe, pain in left toes  Treatment & Plan Procedures and Treatment: Consent by patient was obtained for treatment procedures. The patient understood the discussion of treatment and procedures well. All questions were answered thoroughly reviewed. Debridement of mycotic and hypertrophic toenails, 1 through 5 bilateral and clearing of subungual debris. No ulceration, no infection noted.  Return Visit-Office Procedure: Patient instructed to return to the office for a follow up visit 3 months for continued evaluation and treatment.  [name] DPM

## 2016-05-05 ENCOUNTER — Other Ambulatory Visit: Payer: Self-pay | Admitting: Thoracic Surgery (Cardiothoracic Vascular Surgery)

## 2016-05-05 DIAGNOSIS — I700 Atherosclerosis of aorta: Principal | ICD-10-CM

## 2016-05-05 DIAGNOSIS — I719 Aortic aneurysm of unspecified site, without rupture: Secondary | ICD-10-CM

## 2016-06-26 ENCOUNTER — Ambulatory Visit
Admission: RE | Admit: 2016-06-26 | Discharge: 2016-06-26 | Disposition: A | Discharge status: Home / Self Care | Payer: Medicare Other | Source: Ambulatory Visit | Attending: Thoracic Surgery (Cardiothoracic Vascular Surgery) | Admitting: Thoracic Surgery (Cardiothoracic Vascular Surgery)

## 2016-06-26 ENCOUNTER — Ambulatory Visit (INDEPENDENT_AMBULATORY_CARE_PROVIDER_SITE_OTHER): Payer: Medicare Other | Admitting: Thoracic Surgery (Cardiothoracic Vascular Surgery)

## 2016-06-26 ENCOUNTER — Encounter: Payer: Self-pay | Admitting: Thoracic Surgery (Cardiothoracic Vascular Surgery)

## 2016-06-26 VITALS — BP 148/74 | HR 74 | Resp 20 | Ht 72.0 in | Wt 265.0 lb

## 2016-06-26 DIAGNOSIS — R918 Other nonspecific abnormal finding of lung field: Secondary | ICD-10-CM | POA: Diagnosis not present

## 2016-06-26 DIAGNOSIS — R911 Solitary pulmonary nodule: Secondary | ICD-10-CM

## 2016-06-26 DIAGNOSIS — Z9889 Other specified postprocedural states: Secondary | ICD-10-CM

## 2016-06-26 DIAGNOSIS — Z8679 Personal history of other diseases of the circulatory system: Secondary | ICD-10-CM | POA: Diagnosis not present

## 2016-06-26 DIAGNOSIS — I719 Aortic aneurysm of unspecified site, without rupture: Secondary | ICD-10-CM

## 2016-06-26 DIAGNOSIS — I700 Atherosclerosis of aorta: Secondary | ICD-10-CM

## 2016-06-26 DIAGNOSIS — I4891 Unspecified atrial fibrillation: Secondary | ICD-10-CM | POA: Diagnosis not present

## 2016-06-26 MED ORDER — IOPAMIDOL (ISOVUE-370) INJECTION 76%
60.0000 mL | Freq: Once | INTRAVENOUS | Status: AC | PRN
  Administered 2016-06-26: 60 mL via INTRAVENOUS

## 2016-06-26 NOTE — Progress Notes (Signed)
[address]       [place] [address]             [phone]     CARDIOTHORACIC SURGERY OFFICE NOTE  Referring Provider is Thompson Grayer, MD  Primary Cardiologist is Stanford Breed Denice Bors, MD PCP is Verline Lema, MD   HPI:  Patient is a 76 year old male with multiple medical problems who returns to the office today for follow-up and surveillance of what was felt to be penetrating atherosclerotic ulcer of the descending thoracic aorta. He originally underwent minimally invasive Maze procedure on 10/29/2013 for refractory symptoms secondary to recurrent persistent atrial fibrillation that had failed medical therapy and DC cardioversion on multiple occasions. He was last seen here in our office on 06/22/2015 at which time he was maintaining sinus rhythm and doing quite well.  A CT scan was performed at that time to follow-up previously noted groundglass opacities in the lungs. The groundglass opacities had resolved but there was felt to be a small penetrating atherosclerotic ulcer in the descending thoracic aorta.  Follow-up was recommended. The patient returns to the office today and reports that he has done exceptionally well over the past year. He states that he feels much improved root in comparison with how he felt prior to surgery almost 2 years ago. He states that he still gets some degree of exertional shortness of breath but this continues to improve. He has been more active physically and make an effort to exercise and a regular basis. He never gets any chest pain or chest tightness with activity. He has not had any palpitations and to his knowledge he has not had any irregularities in his heart rhythm since his surgery. He remains chronically anticoagulated using Eliquis.  He is not on any antiarrhythmic medications. He has not had any bleeding episodes nor neurologic events.   Current Outpatient Prescriptions  Medication Sig Dispense Refill  . apixaban (ELIQUIS)  5 MG TABS tablet Take 1 tablet (5 mg total) by mouth 2 (two) times daily. 60 tablet 11  . atorvastatin (LIPITOR) 80 MG tablet Take 1 tablet (80 mg total) by mouth daily with breakfast. 30 tablet 12  . betamethasone dipropionate (DIPROLENE) 0.05 % ointment Apply topically 2 (two) times daily.    . Cholecalciferol (VITAMIN D) 2000 UNITS tablet Take 2,000 Units by mouth daily.    Marland Kitchen docusate sodium (COLACE) 100 MG capsule Take 100 mg by mouth daily as needed for mild constipation.    . fluticasone (FLONASE) 50 MCG/ACT nasal spray Place 1 spray into both nostrils daily as needed for allergies or rhinitis.     . furosemide (LASIX) 40 MG tablet Take 1 tablet (40 mg total) by mouth 2 (two) times daily. If swelling takes an extra dose 60 tablet 6  . ibuprofen (ADVIL,MOTRIN) 200 MG tablet Take 400 mg by mouth daily. Reported on 08/30/2015    . lisinopril (PRINIVIL,ZESTRIL) 20 MG tablet Take 1 tablet (20 mg total) by mouth daily with breakfast. 30 tablet 12  . metoprolol succinate (TOPROL-XL) 100 MG 24 hr tablet Take 1 tablet (100 mg total) by mouth daily. 30 tablet 12  . omeprazole (PRILOSEC) 20 MG capsule Take 20 mg by mouth daily.    . potassium chloride SA (K-DUR,KLOR-CON) 20 MEQ tablet Take 1 tablet (20 mEq total) by mouth daily. 30 tablet 11  . temazepam (RESTORIL) 15 MG capsule Take 15 mg by mouth at bedtime as needed.  No current facility-administered medications for this visit.       Physical Exam:   BP (!) 148/74   Pulse 74   Resp 20   Ht 6' (1.829 m)   Wt 265 lb (120.2 kg)   BMI 35.94 kg/m   General:  Moderately obese but well appearing  Chest:   Clear to auscultation  CV:   Regular rate and rhythm without murmur  Incisions:  n/a  Abdomen:  Soft and nontender  Extremities:  Warm and well-perfused, no edema  Diagnostic Tests:  CT ANGIOGRAPHY CHEST, ABDOMEN AND PELVIS  TECHNIQUE: Multidetector CT imaging through the chest, abdomen and pelvis was performed using the standard  protocol during bolus administration of intravenous contrast. Multiplanar reconstructed images and MIPs were obtained and reviewed to evaluate the vascular anatomy.  CONTRAST:  60 cc Isovue 370  COMPARISON:  06/22/2015, 06/22/2014, 10/22/2013  FINDINGS: CTA CHEST FINDINGS  Cardiovascular:  Heart:  Cardiac size unchanged. No pericardial fluid/thickening surgical changes of left atrial amputation. Calcifications of left main, left anterior descending, circumflex, right coronary arteries.  Aorta:  Minimal aortic valve calcifications. Greatest diameter of the ascending aorta measures 3.5 cm. Minimal calcifications of the aortic arch. Common origin of the innominate artery and left common carotid artery. Mixed calcified and soft plaque of the descending thoracic aorta, similar to prior.  Configuration of the lower descending thoracic aorta unchanged from the comparison with a focal small saccular outpouching just above the aortic hiatus. Coronal images demonstrate that this out pouching results in a diameter of the aorta of approximately 2.7 cm. No evidence of associated intramural hematoma. No periaortic fluid. The base of the out pouching measures 9 mm -10 mm. The depth from the dome to the lumen measures 4 mm. Configuration and size unchanged compared to the prior CT.  Pulmonary arteries:  Timing of the contrast bolus not optimized for evaluation of the pulmonary arteries, which are nearly completely on opacified. Greatest diameter of the main pulmonary artery measures 3.2 cm.  Mediastinum/Nodes: No mediastinal adenopathy.  Hiatal hernia.  Unremarkable appearance of the thoracic inlet.  Lungs/Pleura: Mixed centrilobular and paraseptal emphysema. Atelectasis at the dependent lung bases. No confluent airspace disease.  No pneumothorax.  Upper Abdomen: Unremarkable.  Musculoskeletal: No displaced fracture. Degenerative changes of  the spine.  Review of the MIP images confirms the above findings.  CTA ABDOMEN AND PELVIS FINDINGS  VASCULAR  Aorta: Mixed calcified and soft plaque of the abdominal aorta. Diameter at the aortic hiatus measures 2.5 cm. Greatest diameter of the infrarenal abdominal aorta measures 2.7 cm. No dissection flap. No periaortic fluid.  Celiac: Minimal atherosclerotic changes at the origin of the celiac artery. Typical branch pattern of the celiac artery, with left gastric, common hepatic, and splenic artery identified.  SMA: No significant atherosclerosis at the origin of the SMA, which remains widely patent.  Renals: Accessory renal arteries are present bilaterally. 1 accessory identified on the right with 2 accessory identified on the left. The main left and right renal artery demonstrate mild atherosclerotic changes with no evidence of high-grade stenosis.  IMA: Inferior mesenteric artery patent.  Right lower extremity:  Atherosclerotic changes of the right iliac system without occlusion or significant stenosis. Hypogastric artery is patent. Common femoral artery and proximal femoral vasculature patent.  Left lower extremity:  Atherosclerotic changes of the left iliac system without occlusion or significant stenosis. Hypogastric artery is patent. Common femoral artery patent. Proximal femoral vasculature patent.  Veins: Unremarkable appearance of the venous system.  Review of the MIP images confirms the above findings.  NON-VASCULAR  Hepatobiliary: Unremarkable appearance of the liver. Unremarkable gall bladder.  Pancreas: Fatty infiltration of pancreatic parenchyma. No inflammatory changes.  Spleen: Unremarkable appearance of the spleen.  Adrenals/Urinary Tract: Unremarkable appearance of adrenal glands.  Right:  No hydronephrosis. Symmetric perfusion to the left. No nephrolithiasis. Unremarkable course of the right ureter.  Left:  No  hydronephrosis. Symmetric perfusion to the right. No nephrolithiasis. Unremarkable course of the left ureter.  Unremarkable appearance of the urinary bladder .  Stomach/Bowel: Small hiatal hernia. Unremarkable appearance of the stomach. Unremarkable appearance of small bowel with no abnormal distention. Normal appendix. Colonic diverticular disease without evidence of acute inflammatory changes. Redundancy of the sigmoid colon, which crosses the midline to the right abdomen.  Lymphatic: No lymphadenopathy.  No inflammatory changes.  Mesenteric: No free fluid or air. No adenopathy.  Reproductive: Unremarkable appearance the pelvic organs.  Other: Fact containing umbilical hernia.  Musculoskeletal: Degenerative changes of the lumbar spine. No displaced fracture. No bony canal narrowing. Bilateral facet disease.  IMPRESSION: No acute finding.  Re- demonstration of small, focal saccular outpouching of the distal descending thoracic aorta, with no evidence of associated intramural hematoma, aortic wall thickening, or inflammatory changes. Finding favored to represent irregular plaque or possibly early stages of ulcer formation, and not a penetrating ulcer per se, as there are no CT findings of acute aortic syndrome.  Abdominal aortic atherosclerosis with associated mesenteric and renal disease without evidence of high-grade stenosis. Associated coronary artery disease.  Surgical changes of prior left atrial amputation.  Diverticular disease without evidence of acute diverticulitis.  Centrilobular and paraseptal emphysema.  Signed,  Dulcy Fanny. Earleen Newport, DO  [facility]  [facility]   Electronically Signed   By: Corrie Mckusick D.O.   On: 06/26/2016 13:49   Impression:  Patient is doing well and maintaining sinus rhythm approximately 2-1/2 years status post minimally invasive Maze procedure. Follow-up CT  angiogram reveals a stable, small outpouching of the descending thoracic aorta that is felt to represent atherosclerotic plaque.  Plan:  I discussed the results of this and with the patient. I feel that no further follow-up is necessary. In the future he will call and return to see [place] as needed. All of his questions have been addressed.  I spent in excess of 15 minutes during the conduct of this office consultation and >50% of this time involved direct face-to-face encounter with the patient for counseling and/or coordination of their care.   Valentina Gu. Roxy Manns, MD 06/26/2016 3:37 PM

## 2016-06-26 NOTE — Patient Instructions (Signed)
Continue all previous medications without any changes at this time  

## 2016-06-28 ENCOUNTER — Ambulatory Visit (INDEPENDENT_AMBULATORY_CARE_PROVIDER_SITE_OTHER): Payer: Medicare Other | Admitting: Podiatry

## 2016-06-28 ENCOUNTER — Encounter: Payer: Self-pay | Admitting: Podiatry

## 2016-06-28 DIAGNOSIS — B351 Tinea unguium: Secondary | ICD-10-CM | POA: Diagnosis not present

## 2016-06-28 DIAGNOSIS — M79676 Pain in unspecified toe(s): Secondary | ICD-10-CM | POA: Diagnosis not present

## 2016-06-28 NOTE — Progress Notes (Signed)
Patient ID: Jose Snyder, male   DOB: 1941-01-30, 76 y.o.   MRN: [id number] Complaint:  Visit Type: Patient returns to my office for continued preventative foot care services. Complaint: Patient states" my nails have grown long and thick and become painful to walk and wear shoes. The patient presents for preventative foot care services. He is attempting to lose wight.  Podiatric Exam: Vascular: dorsalis pedis and posterior tibial pulses are palpable bilateral. Capillary return is immediate. Temperature gradient is WNL. Skin turgor WNL  Sensorium: Normal Semmes Weinstein monofilament test. Normal tactile sensation bilaterally. Nail Exam: Pt has thick disfigured discolored nails with subungual debris noted bilateral entire nail hallux through fifth toenails Ulcer Exam: There is no evidence of ulcer or pre-ulcerative changes or infection. Orthopedic Exam: Muscle tone and strength are WNL. No limitations in general ROM. No crepitus or effusions noted. Foot type and digits show no abnormalities. Bony prominences are unremarkable. Skin: No Porokeratosis. No infection or ulcers  Diagnosis:  Onychomycosis, , Pain in right toe, pain in left toes  Treatment & Plan Procedures and Treatment: Consent by patient was obtained for treatment procedures. The patient understood the discussion of treatment and procedures well. All questions were answered thoroughly reviewed. Debridement of mycotic and hypertrophic toenails, 1 through 5 bilateral and clearing of subungual debris. No ulceration, no infection noted.  Return Visit-Office Procedure: Patient instructed to return to the office for a follow up visit 3 months for continued evaluation and treatment.  [name] DPM

## 2016-09-27 ENCOUNTER — Ambulatory Visit (INDEPENDENT_AMBULATORY_CARE_PROVIDER_SITE_OTHER): Payer: Medicare Other | Admitting: Podiatry

## 2016-09-27 ENCOUNTER — Encounter: Payer: Self-pay | Admitting: Podiatry

## 2016-09-27 DIAGNOSIS — C61 Malignant neoplasm of prostate: Secondary | ICD-10-CM | POA: Diagnosis not present

## 2016-09-27 DIAGNOSIS — M79676 Pain in unspecified toe(s): Secondary | ICD-10-CM | POA: Diagnosis not present

## 2016-09-27 DIAGNOSIS — B351 Tinea unguium: Secondary | ICD-10-CM | POA: Diagnosis not present

## 2016-09-27 NOTE — Progress Notes (Signed)
Patient ID: Jose Snyder, male   DOB: 25-May-1940, 76 y.o.   MRN: [id number] Complaint:  Visit Type: Patient returns to my office for continued preventative foot care services. Complaint: Patient states" my nails have grown long and thick and become painful to walk and wear shoes. The patient presents for preventative foot care services. He is attempting to lose wight.  Podiatric Exam: Vascular: dorsalis pedis and posterior tibial pulses are palpable bilateral. Capillary return is immediate. Temperature gradient is WNL. Skin turgor WNL  Sensorium: Normal Semmes Weinstein monofilament test. Normal tactile sensation bilaterally. Nail Exam: Pt has thick disfigured discolored nails with subungual debris noted bilateral entire nail hallux through fifth toenails Ulcer Exam: There is no evidence of ulcer or pre-ulcerative changes or infection. Orthopedic Exam: Muscle tone and strength are WNL. No limitations in general ROM. No crepitus or effusions noted. Foot type and digits show no abnormalities. Bony prominences are unremarkable. Skin: No Porokeratosis. No infection or ulcers  Diagnosis:  Onychomycosis, , Pain in right toe, pain in left toes  Treatment & Plan Procedures and Treatment: Consent by patient was obtained for treatment procedures. The patient understood the discussion of treatment and procedures well. All questions were answered thoroughly reviewed. Debridement of mycotic and hypertrophic toenails, 1 through 5 bilateral and clearing of subungual debris. No ulceration, no infection noted.  Return Visit-Office Procedure: Patient instructed to return to the office for a follow up visit 3 months for continued evaluation and treatment.  [name] DPM

## 2016-10-10 DIAGNOSIS — C61 Malignant neoplasm of prostate: Secondary | ICD-10-CM | POA: Diagnosis not present

## 2016-10-10 DIAGNOSIS — N5201 Erectile dysfunction due to arterial insufficiency: Secondary | ICD-10-CM | POA: Diagnosis not present

## 2016-10-10 DIAGNOSIS — N481 Balanitis: Secondary | ICD-10-CM | POA: Diagnosis not present

## 2016-12-29 ENCOUNTER — Ambulatory Visit (INDEPENDENT_AMBULATORY_CARE_PROVIDER_SITE_OTHER): Payer: Medicare Other | Admitting: Podiatry

## 2016-12-29 ENCOUNTER — Encounter: Payer: Self-pay | Admitting: Podiatry

## 2016-12-29 DIAGNOSIS — M79676 Pain in unspecified toe(s): Secondary | ICD-10-CM | POA: Diagnosis not present

## 2016-12-29 DIAGNOSIS — B351 Tinea unguium: Secondary | ICD-10-CM

## 2016-12-29 NOTE — Progress Notes (Addendum)
Patient ID: Jose Snyder, male   DOB: February 20, 1941, 76 y.o.   MRN: [id number] Complaint:  Visit Type: Patient returns to my office for continued preventative foot care services. Complaint: Patient states" my nails have grown long and thick and become painful to walk and wear shoes. The patient presents for preventative foot care services. He is attempting to lose wight.  Podiatric Exam: Vascular: dorsalis pedis and posterior tibial pulses are palpable bilateral. Capillary return is immediate. Temperature gradient is WNL. Skin turgor WNL  Sensorium: Normal Semmes Weinstein monofilament test. Normal tactile sensation bilaterally. Nail Exam: Pt has thick disfigured discolored nails with subungual debris noted bilateral entire nail hallux through fifth toenails Ulcer Exam: There is no evidence of ulcer or pre-ulcerative changes or infection. Orthopedic Exam: Muscle tone and strength are WNL. No limitations in general ROM. No crepitus or effusions noted. Foot type and digits show no abnormalities. Bony prominences are unremarkable. Skin: No Porokeratosis. No infection or ulcers  Diagnosis:  Onychomycosis, , Pain in right toe, pain in left toes  Treatment & Plan Procedures and Treatment: Consent by patient was obtained for treatment procedures. The patient understood the discussion of treatment and procedures well. All questions were answered thoroughly reviewed. Debridement of mycotic and hypertrophic toenails, 1 through 5 bilateral and clearing of subungual debris. No ulceration, no infection noted. ABN signed for 2018. Return Visit-Office Procedure: Patient instructed to return to the office for a follow up visit 3 months for continued evaluation and treatment.  [name] DPM

## 2017-03-23 ENCOUNTER — Ambulatory Visit (INDEPENDENT_AMBULATORY_CARE_PROVIDER_SITE_OTHER): Payer: Medicare Other | Admitting: Podiatry

## 2017-03-23 ENCOUNTER — Encounter: Payer: Self-pay | Admitting: Podiatry

## 2017-03-23 DIAGNOSIS — B351 Tinea unguium: Secondary | ICD-10-CM

## 2017-03-23 DIAGNOSIS — M79676 Pain in unspecified toe(s): Secondary | ICD-10-CM | POA: Diagnosis not present

## 2017-03-23 DIAGNOSIS — D689 Coagulation defect, unspecified: Secondary | ICD-10-CM

## 2017-03-23 NOTE — Progress Notes (Signed)
Patient ID: Jose Snyder, male   DOB: January 23, 1941, 77 y.o.   MRN: [id number] Complaint:  Visit Type: Patient returns to my office for continued preventative foot care services. Complaint: Patient states" my nails have grown long and thick and become painful to walk and wear shoes. The patient presents for preventative foot care services. He is attempting to lose wight. Patient is on eliquiss.  Podiatric Exam: Vascular: dorsalis pedis and posterior tibial pulses are palpable bilateral. Capillary return is immediate. Temperature gradient is WNL. Skin turgor WNL  Sensorium: Normal Semmes Weinstein monofilament test. Normal tactile sensation bilaterally. Nail Exam: Pt has thick disfigured discolored nails with subungual debris noted bilateral entire nail hallux through fifth toenails Ulcer Exam: There is no evidence of ulcer or pre-ulcerative changes or infection. Orthopedic Exam: Muscle tone and strength are WNL. No limitations in general ROM. No crepitus or effusions noted. Foot type and digits show no abnormalities. Bony prominences are unremarkable. Skin: No Porokeratosis. No infection or ulcers  Diagnosis:  Onychomycosis, , Pain in right toe, pain in left toes  Treatment & Plan Procedures and Treatment: Consent by patient was obtained for treatment procedures. The patient understood the discussion of treatment and procedures well. All questions were answered thoroughly reviewed. Debridement of mycotic and hypertrophic toenails, 1 through 5 bilateral and clearing of subungual debris. No ulceration, no infection noted. ABN signed for 2019. Return Visit-Office Procedure: Patient instructed to return to the office for a follow up visit 3 months for continued evaluation and treatment.  [name] DPM

## 2017-03-27 DIAGNOSIS — C61 Malignant neoplasm of prostate: Secondary | ICD-10-CM | POA: Diagnosis not present

## 2017-04-04 DIAGNOSIS — C61 Malignant neoplasm of prostate: Secondary | ICD-10-CM | POA: Diagnosis not present

## 2017-04-04 DIAGNOSIS — N5201 Erectile dysfunction due to arterial insufficiency: Secondary | ICD-10-CM | POA: Diagnosis not present

## 2017-04-04 DIAGNOSIS — N481 Balanitis: Secondary | ICD-10-CM | POA: Diagnosis not present

## 2017-04-05 ENCOUNTER — Other Ambulatory Visit: Payer: Self-pay | Admitting: Urology

## 2017-04-05 DIAGNOSIS — C61 Malignant neoplasm of prostate: Secondary | ICD-10-CM

## 2017-04-16 ENCOUNTER — Ambulatory Visit
Admission: RE | Admit: 2017-04-16 | Discharge: 2017-04-16 | Disposition: A | Discharge status: Home / Self Care | Payer: Medicare Other | Source: Ambulatory Visit | Attending: Urology | Admitting: Urology

## 2017-04-16 DIAGNOSIS — C61 Malignant neoplasm of prostate: Secondary | ICD-10-CM

## 2017-04-16 MED ORDER — GADOBENATE DIMEGLUMINE 529 MG/ML IV SOLN: 20.0000 mL | Freq: Once | INTRAVENOUS | Status: DC | PRN

## 2017-06-20 ENCOUNTER — Ambulatory Visit (HOSPITAL_COMMUNITY)
Admission: RE | Admit: 2017-06-20 | Discharge: 2017-06-20 | Disposition: A | Discharge status: Home / Self Care | Payer: Medicare Other | Source: Ambulatory Visit | Attending: Nurse Practitioner | Admitting: Nurse Practitioner

## 2017-06-20 ENCOUNTER — Encounter (HOSPITAL_COMMUNITY): Payer: Self-pay | Admitting: Nurse Practitioner

## 2017-06-20 VITALS — BP 166/84 | HR 70 | Ht 72.0 in | Wt 266.0 lb

## 2017-06-20 DIAGNOSIS — I481 Persistent atrial fibrillation: Secondary | ICD-10-CM | POA: Diagnosis not present

## 2017-06-20 DIAGNOSIS — E669 Obesity, unspecified: Secondary | ICD-10-CM | POA: Insufficient documentation

## 2017-06-20 DIAGNOSIS — E785 Hyperlipidemia, unspecified: Secondary | ICD-10-CM | POA: Insufficient documentation

## 2017-06-20 DIAGNOSIS — Z7901 Long term (current) use of anticoagulants: Secondary | ICD-10-CM | POA: Diagnosis not present

## 2017-06-20 DIAGNOSIS — I130 Hypertensive heart and chronic kidney disease with heart failure and stage 1 through stage 4 chronic kidney disease, or unspecified chronic kidney disease: Secondary | ICD-10-CM | POA: Insufficient documentation

## 2017-06-20 DIAGNOSIS — I2510 Atherosclerotic heart disease of native coronary artery without angina pectoris: Secondary | ICD-10-CM | POA: Insufficient documentation

## 2017-06-20 DIAGNOSIS — Z8546 Personal history of malignant neoplasm of prostate: Secondary | ICD-10-CM | POA: Insufficient documentation

## 2017-06-20 DIAGNOSIS — Z9889 Other specified postprocedural states: Secondary | ICD-10-CM | POA: Diagnosis not present

## 2017-06-20 DIAGNOSIS — I5042 Chronic combined systolic (congestive) and diastolic (congestive) heart failure: Secondary | ICD-10-CM | POA: Diagnosis not present

## 2017-06-20 DIAGNOSIS — Z955 Presence of coronary angioplasty implant and graft: Secondary | ICD-10-CM | POA: Insufficient documentation

## 2017-06-20 DIAGNOSIS — I4819 Other persistent atrial fibrillation: Secondary | ICD-10-CM

## 2017-06-20 DIAGNOSIS — Z79899 Other long term (current) drug therapy: Secondary | ICD-10-CM | POA: Insufficient documentation

## 2017-06-20 DIAGNOSIS — K219 Gastro-esophageal reflux disease without esophagitis: Secondary | ICD-10-CM | POA: Diagnosis not present

## 2017-06-20 DIAGNOSIS — Z6836 Body mass index (BMI) 36.0-36.9, adult: Secondary | ICD-10-CM | POA: Insufficient documentation

## 2017-06-20 DIAGNOSIS — Z96652 Presence of left artificial knee joint: Secondary | ICD-10-CM | POA: Diagnosis not present

## 2017-06-20 DIAGNOSIS — N189 Chronic kidney disease, unspecified: Secondary | ICD-10-CM | POA: Insufficient documentation

## 2017-06-20 NOTE — Progress Notes (Signed)
Primary Care Physician: Verline Lema, MD Referring Physician: Dr. Lorelee Cover Harr is a 77 y.o. male with a h/o  underwent minimally invasive Maze procedure on 10/29/2013 for refractory symptoms secondary to recurrent persistent atrial fibrillation that had failed medical therapy and DC cardioversion on multiple occasions. He was last seen by Dr. Roxy Manns on 06/22/2015 at which time he was maintaining sinus rhythm and doing quite well.  He was recently seen by Dr.Owen for f/u  what was felt to be penetrating atherosclerotic ulcer of the descending thoracic aorta.F/u CT revealed a stable, small outpouching of the descending thoracic aorta felt to represent atherosclerotic plague.  He is beening seen in the afib clinic for long term surveillance of Maze, today, 06/20/17. As far as pt is concerned he has not experienced any Afib since the procedure. Has some shortness of breath, but much improved since the Maze. He has recently made a lot of diet changes and has lost 6 lbs. This has helped with his shortness of breath. He is determined to be consistent in his weight loss efforts.  Today, he denies symptoms of palpitations, chest pain, shortness of breath, orthopnea, PND, lower extremity edema, dizziness, presyncope, syncope, or neurologic sequela. The patient is tolerating medications without difficulties and is otherwise without complaint today.   Past Medical History:  Diagnosis Date  . Anxiety   . Arthritis    R hand- OA  . Atrial Fibrillation  06/25/2013  . CAD (coronary artery disease), native coronary artery 06/28/2013   a. LHC (06/27/13):  Mid LAD 40%, mid D1 75%, mid CFX 50-60%, mid RCA 50%, EF 20%, LVEDP 24 mmHg.  . CHF (congestive heart failure) ([place])   . Chronic combined systolic and diastolic heart failure ([place]) 06/26/2013   a.  Echo (06/25/13):  EF 20-25%, diff HK, restrictive physio, mild MR, mod to severe LAE, mild RVE, mildly reduced RVSF, mod to severe RAE  . Chronic kidney disease     . Dyslipidemia 07/15/2013  . Dysrhythmia    a-fib  . GERD (gastroesophageal reflux disease)   . Hypertension   . Incidental pulmonary nodule 06/22/2014   Seen on CT angiogram of chest  . NICM (nonischemic cardiomyopathy) with EF 20-25% 06/26/2013  . Obesity   . Penetrating atherosclerotic ulcer of aorta ([place]) 10/22/2013   Small penetrating ulcer of descending thoracic aorta discovered on routine CTA  . Prostate cancer ([place])    watchful waiting, no treatment so far  . S/P Maze operation for atrial fibrillation 10/29/2013   Complete bilateral atrial lesion set using cryothermy and bipolar radiofrequency ablation with clipping of LA appendage via right mini thoracotomy  . Shortness of breath    seen by Stanford Breed, & low energy    Past Surgical History:  Procedure Laterality Date  . CARDIAC CATHETERIZATION  06/2013  . CARDIOVERSION N/A 06/30/2013   Procedure: CARDIOVERSION;  Surgeon: [name], MD;  Location: [facility];  Service: Cardiovascular;  Laterality: N/A;  . CARDIOVERSION N/A 07/31/2013   Procedure: CARDIOVERSION;  Surgeon: Thayer Headings, MD;  Location: [facility];  Service: Cardiovascular;  Laterality: N/A;  . CLIPPING OF ATRIAL APPENDAGE  10/29/2013   Procedure: CLIPPING OF ATRIAL APPENDAGE;  Surgeon: Rexene Alberts, MD;  Location: [facility];  Service: Open Heart Surgery;;  . INTRAOPERATIVE TRANSESOPHAGEAL ECHOCARDIOGRAM N/A 10/29/2013   Procedure: INTRAOPERATIVE TRANSESOPHAGEAL ECHOCARDIOGRAM;  Surgeon: Rexene Alberts, MD;  Location: [facility];  Service: Open Heart Surgery;  Laterality: N/A;  . LEFT AND  RIGHT HEART CATHETERIZATION WITH CORONARY ANGIOGRAM N/A 06/27/2013   Procedure: LEFT AND RIGHT HEART CATHETERIZATION WITH CORONARY ANGIOGRAM;  Surgeon: Jettie Booze, MD;  Location: [facility];  Service: Cardiovascular;  Laterality: N/A;  . MEDIAL PARTIAL KNEE REPLACEMENT Left 1990's?  Marland Kitchen MINIMALLY INVASIVE MAZE PROCEDURE N/A 10/29/2013   Procedure: MINIMALLY INVASIVE MAZE PROCEDURE;   Surgeon: Rexene Alberts, MD;  Location: [facility];  Service: Open Heart Surgery;  Laterality: N/A;  . TEE WITHOUT CARDIOVERSION N/A 06/30/2013   Procedure: TRANSESOPHAGEAL ECHOCARDIOGRAM (TEE) ;  Surgeon: [name], MD;  Location: [facility];  Service: Cardiovascular;  Laterality: N/A;  . TRANSURETHRAL RESECTION OF PROSTATE  2010    Current Outpatient Medications  Medication Sig Dispense Refill  . apixaban (ELIQUIS) 5 MG TABS tablet Take 1 tablet (5 mg total) by mouth 2 (two) times daily. 60 tablet 11  . atorvastatin (LIPITOR) 80 MG tablet Take 1 tablet (80 mg total) by mouth daily with breakfast. 30 tablet 12  . betamethasone dipropionate (DIPROLENE) 0.05 % ointment Apply topically 2 (two) times daily.    . Cholecalciferol (VITAMIN D) 2000 UNITS tablet Take 2,000 Units by mouth daily.    Marland Kitchen docusate sodium (COLACE) 100 MG capsule Take 100 mg by mouth daily as needed for mild constipation.    . fluticasone (FLONASE) 50 MCG/ACT nasal spray Place 1 spray into both nostrils daily as needed for allergies or rhinitis.     . furosemide (LASIX) 40 MG tablet Take 1 tablet (40 mg total) by mouth 2 (two) times daily. If swelling takes an extra dose 60 tablet 6  . ibuprofen (ADVIL,MOTRIN) 200 MG tablet Take 400 mg by mouth daily. Reported on 08/30/2015    . lisinopril (PRINIVIL,ZESTRIL) 20 MG tablet Take 1 tablet (20 mg total) by mouth daily with breakfast. 30 tablet 12  . metoprolol succinate (TOPROL-XL) 100 MG 24 hr tablet Take 1 tablet (100 mg total) by mouth daily. 30 tablet 12  . omeprazole (PRILOSEC) 20 MG capsule Take 20 mg by mouth daily.    . potassium chloride SA (K-DUR,KLOR-CON) 20 MEQ tablet Take 1 tablet (20 mEq total) by mouth daily. 30 tablet 11  . temazepam (RESTORIL) 15 MG capsule Take 15 mg by mouth at bedtime as needed.      No current facility-administered medications for this encounter.     No Known Allergies  Social History   Socioeconomic History  . Marital status: Widowed     Spouse name: Not on file  . Number of children: Not on file  . Years of education: Not on file  . Highest education level: Not on file  Occupational History  . Not on file  Social Needs  . Financial resource strain: Not on file  . Food insecurity:    Worry: Not on file    Inability: Not on file  . Transportation needs:    Medical: Not on file    Non-medical: Not on file  Tobacco Use  . Smoking status: Never Smoker  . Smokeless tobacco: Never Used  . Tobacco comment: 06/25/2013 "smoked very light; years and years ago"  Substance and Sexual Activity  . Alcohol use: Yes    Alcohol/week: 8.4 oz    Types: 7 Glasses of wine, 7 Shots of liquor per week  . Drug use: No  . Sexual activity: Not Currently  Lifestyle  . Physical activity:    Days per week: Not on file    Minutes per session: Not on file  .  Stress: Not on file  Relationships  . Social connections:    Talks on phone: Not on file    Gets together: Not on file    Attends religious service: Not on file    Active member of club or organization: Not on file    Attends meetings of clubs or organizations: Not on file    Relationship status: Not on file  . Intimate partner violence:    Fear of current or ex partner: Not on file    Emotionally abused: Not on file    Physically abused: Not on file    Forced sexual activity: Not on file  Other Topics Concern  . Not on file  Social History Narrative   He lives in [place]. He is widowed. Wife passed of breast cancer last year. No children.     Family History  Problem Relation Age of Onset  . Healthy Father   . Healthy Mother   . Healthy Sister     ROS- All systems are reviewed and negative except as per the HPI above  Physical Exam: Vitals:   06/20/17 0948  BP: (!) 166/84  Pulse: 70  Weight: 266 lb (120.7 kg)  Height: 6' (1.829 m)   Wt Readings from Last 3 Encounters:  06/20/17 266 lb (120.7 kg)  06/26/16 265 lb (120.2 kg)  04/05/16 270 lb (122.5 kg)     Labs: Lab Results  Component Value Date   NA 137 11/16/2015   K 4.7 11/16/2015   CL 103 11/16/2015   CO2 26 11/16/2015   GLUCOSE 113 (H) 11/16/2015   BUN 17 11/16/2015   CREATININE 1.42 (H) 11/16/2015   CALCIUM 9.2 11/16/2015   MG 2.4 10/30/2013   Lab Results  Component Value Date   INR 1.50 (H) 10/29/2013   Lab Results  Component Value Date   CHOL 114 (L) 11/16/2015   HDL 34 (L) 11/16/2015   LDLCALC 44 11/16/2015   TRIG 178 (H) 11/16/2015     GEN- The patient is well appearing, alert and oriented x 3 today.   Head- normocephalic, atraumatic Eyes-  Sclera clear, conjunctiva pink Ears- hearing intact Oropharynx- clear Neck- supple, no JVP Lymph- no cervical lymphadenopathy Lungs- Clear to ausculation bilaterally, normal work of breathing Heart- Regular rate and rhythm, no murmurs, rubs or gallops, PMI not laterally displaced GI- soft, NT, ND, + BS Extremities- no clubbing, cyanosis, or edema MS- no significant deformity or atrophy Skin- no rash or lesion Psych- euthymic mood, full affect Neuro- strength and sensation are intact  EKG-NSR at 80 bpm, pr int 156 ms, qrs int 86 ms, qtc 424 ms Epic records reviewed    Assessment and Plan: 1. Afib, s/p Maze procedure  Maintaining SR Shortness of breath much improved since Maze procedure Encouraged weight loss efforts, he is excited he has lost 6 lbs and is noticing improvement in exertional dyspnea Continue metoprolol at 100 mg daily Continue eliquis 5 mg bid  2. Abnormal CT Thought to be stable atherosclerotic plague by Dr. Roxy Manns and no f/u is needed  F/u in afib clinic in one year  Butch Penny C. Keats Kingry, [facility] [address] [place], [place] [address] [phone]

## 2017-06-22 ENCOUNTER — Encounter: Payer: Self-pay | Admitting: Podiatry

## 2017-06-22 ENCOUNTER — Ambulatory Visit (INDEPENDENT_AMBULATORY_CARE_PROVIDER_SITE_OTHER): Payer: Medicare Other | Admitting: Podiatry

## 2017-06-22 DIAGNOSIS — M79676 Pain in unspecified toe(s): Secondary | ICD-10-CM

## 2017-06-22 DIAGNOSIS — D689 Coagulation defect, unspecified: Secondary | ICD-10-CM

## 2017-06-22 DIAGNOSIS — B351 Tinea unguium: Secondary | ICD-10-CM

## 2017-06-22 NOTE — Progress Notes (Signed)
Patient ID: Jose Snyder, male   DOB: 06/13/1940, 77 y.o.   MRN: [id number] Complaint:  Visit Type: Patient returns to my office for continued preventative foot care services. Complaint: Patient states" my nails have grown long and thick and become painful to walk and wear shoes. The patient presents for preventative foot care services. He is attempting to lose wight. Patient is on eliquiss.  Podiatric Exam: Vascular: dorsalis pedis and posterior tibial pulses are palpable bilateral. Capillary return is immediate. Temperature gradient is WNL. Skin turgor WNL  Sensorium: Normal Semmes Weinstein monofilament test. Normal tactile sensation bilaterally. Nail Exam: Pt has thick disfigured discolored nails with subungual debris noted bilateral entire nail hallux through fifth toenails Ulcer Exam: There is no evidence of ulcer or pre-ulcerative changes or infection. Orthopedic Exam: Muscle tone and strength are WNL. No limitations in general ROM. No crepitus or effusions noted. Foot type and digits show no abnormalities. Bony prominences are unremarkable. Skin: No Porokeratosis. No infection or ulcers  Diagnosis:  Onychomycosis, , Pain in right toe, pain in left toes  Treatment & Plan Procedures and Treatment: Consent by patient was obtained for treatment procedures. The patient understood the discussion of treatment and procedures well. All questions were answered thoroughly reviewed. Debridement of mycotic and hypertrophic toenails, 1 through 5 bilateral and clearing of subungual debris. No ulceration, no infection noted. ABN signed for 2019. Return Visit-Office Procedure: Patient instructed to return to the office for a follow up visit 3 months for continued evaluation and treatment.  Gavrielle Streck DPM 

## 2017-09-21 ENCOUNTER — Ambulatory Visit: Payer: Medicare Other | Admitting: Podiatry

## 2017-09-26 ENCOUNTER — Ambulatory Visit (INDEPENDENT_AMBULATORY_CARE_PROVIDER_SITE_OTHER): Payer: Medicare Other | Admitting: Podiatry

## 2017-09-26 ENCOUNTER — Encounter: Payer: Self-pay | Admitting: Podiatry

## 2017-09-26 DIAGNOSIS — D689 Coagulation defect, unspecified: Secondary | ICD-10-CM | POA: Diagnosis not present

## 2017-09-26 DIAGNOSIS — M79676 Pain in unspecified toe(s): Secondary | ICD-10-CM

## 2017-09-26 DIAGNOSIS — B351 Tinea unguium: Secondary | ICD-10-CM

## 2017-09-26 DIAGNOSIS — C61 Malignant neoplasm of prostate: Secondary | ICD-10-CM | POA: Diagnosis not present

## 2017-09-26 NOTE — Progress Notes (Signed)
Patient ID: Jose Snyder, male   DOB: [birth year], 77 y.o.   MRN: [id number] Complaint:  Visit Type: Patient returns to my office for continued preventative foot care services. Complaint: Patient states" my nails have grown long and thick and become painful to walk and wear shoes. The patient presents for preventative foot care services. He is attempting to lose wight. Patient is on eliquiss.  Podiatric Exam: Vascular: dorsalis pedis and posterior tibial pulses are palpable bilateral. Capillary return is immediate. Temperature gradient is WNL. Skin turgor WNL  Sensorium: Normal Semmes Weinstein monofilament test. Normal tactile sensation bilaterally. Nail Exam: Pt has thick disfigured discolored nails with subungual debris noted bilateral entire nail hallux through fifth toenails Ulcer Exam: There is no evidence of ulcer or pre-ulcerative changes or infection. Orthopedic Exam: Muscle tone and strength are WNL. No limitations in general ROM. No crepitus or effusions noted. Foot type and digits show no abnormalities. Bony prominences are unremarkable. Skin: No Porokeratosis. No infection or ulcers  Diagnosis:  Onychomycosis, , Pain in right toe, pain in left toes  Treatment & Plan Procedures and Treatment: Consent by patient was obtained for treatment procedures. The patient understood the discussion of treatment and procedures well. All questions were answered thoroughly reviewed. Debridement of mycotic and hypertrophic toenails, 1 through 5 bilateral and clearing of subungual debris. No ulceration, no infection noted. ABN signed for 2019. Return Visit-Office Procedure: Patient instructed to return to the office for a follow up visit 3 months for continued evaluation and treatment.  [name] DPM

## 2017-10-03 DIAGNOSIS — N481 Balanitis: Secondary | ICD-10-CM | POA: Diagnosis not present

## 2017-10-03 DIAGNOSIS — C61 Malignant neoplasm of prostate: Secondary | ICD-10-CM | POA: Diagnosis not present

## 2017-10-03 DIAGNOSIS — N5201 Erectile dysfunction due to arterial insufficiency: Secondary | ICD-10-CM | POA: Diagnosis not present

## 2017-11-20 ENCOUNTER — Encounter: Payer: Self-pay | Admitting: Cardiology

## 2017-11-28 NOTE — Progress Notes (Signed)
HPI: FU atrial fibrillation. Echo 4/15 EF 20-25%. LHC demonstrated mod non-obs CAD. Most significant lesion was a mid D1 with 75% stenosis. DCM was felt to be out of proportion to CAD (NICM). Had DCCV on amiodarone but did not hold sinus. He was seen by Dr. Rayann Heman and referred to Dr. Roxy Manns for surgical maze. Carotid Dopplers August 2015 showed 1-39% bilateral stenosis. Patient underwent maze procedure with clipping of left atrial appendage on October 29, 2013. Echocardiogram repeated December 2015 and showed improvement in LV function. Ejection fraction 50-55% and mild left atrial enlargement. CTA April 2018 showed focal saccular outpouching of the distal descending thoracic aorta with no intramural hematoma or inflammatory changes.  Findings felt possibly early ulcer formation and not a penetrating ulcer per se.  Images were reviewed by Dr. Roxy Manns and no further follow-up recommended.  Since last seen, patient denies dyspnea, chest pain, palpitations or syncope.  No bleeding.  Current Outpatient Medications  Medication Sig Dispense Refill  . apixaban (ELIQUIS) 5 MG TABS tablet Take 1 tablet (5 mg total) by mouth 2 (two) times daily. 60 tablet 11  . atorvastatin (LIPITOR) 80 MG tablet Take 1 tablet (80 mg total) by mouth daily with breakfast. 30 tablet 12  . betamethasone dipropionate (DIPROLENE) 0.05 % ointment Apply topically 2 (two) times daily.    . Cholecalciferol (VITAMIN D) 2000 UNITS tablet Take 2,000 Units by mouth daily.    Marland Kitchen docusate sodium (COLACE) 100 MG capsule Take 100 mg by mouth daily as needed for mild constipation.    . fluticasone (FLONASE) 50 MCG/ACT nasal spray Place 1 spray into both nostrils daily as needed for allergies or rhinitis.     . furosemide (LASIX) 40 MG tablet Take 1 tablet (40 mg total) by mouth 2 (two) times daily. If swelling takes an extra dose 60 tablet 6  . ibuprofen (ADVIL,MOTRIN) 200 MG tablet Take 400 mg by mouth daily. Reported on 08/30/2015    .  lisinopril (PRINIVIL,ZESTRIL) 20 MG tablet Take 1 tablet (20 mg total) by mouth daily with breakfast. 30 tablet 12  . metoprolol succinate (TOPROL-XL) 100 MG 24 hr tablet Take 1 tablet (100 mg total) by mouth daily. 30 tablet 12  . omeprazole (PRILOSEC) 20 MG capsule Take 20 mg by mouth daily.    . potassium chloride SA (K-DUR,KLOR-CON) 20 MEQ tablet Take 1 tablet (20 mEq total) by mouth daily. 30 tablet 11  . temazepam (RESTORIL) 15 MG capsule Take 15 mg by mouth at bedtime as needed.      No current facility-administered medications for this visit.      Past Medical History:  Diagnosis Date  . Anxiety   . Arthritis    R hand- OA  . Atrial Fibrillation  06/25/2013  . CAD (coronary artery disease), native coronary artery 06/28/2013   a. LHC (06/27/13):  Mid LAD 40%, mid D1 75%, mid CFX 50-60%, mid RCA 50%, EF 20%, LVEDP 24 mmHg.  . CHF (congestive heart failure) ([place])   . Chronic combined systolic and diastolic heart failure ([place]) 06/26/2013   a.  Echo (06/25/13):  EF 20-25%, diff HK, restrictive physio, mild MR, mod to severe LAE, mild RVE, mildly reduced RVSF, mod to severe RAE  . Chronic kidney disease   . Dyslipidemia 07/15/2013  . Dysrhythmia    a-fib  . GERD (gastroesophageal reflux disease)   . Hypertension   . Incidental pulmonary nodule 06/22/2014   Seen on CT angiogram of chest  .  NICM (nonischemic cardiomyopathy) with EF 20-25% 06/26/2013  . Obesity   . Penetrating atherosclerotic ulcer of aorta ([place]) 10/22/2013   Small penetrating ulcer of descending thoracic aorta discovered on routine CTA  . Prostate cancer ([place])    watchful waiting, no treatment so far  . S/P Maze operation for atrial fibrillation 10/29/2013   Complete bilateral atrial lesion set using cryothermy and bipolar radiofrequency ablation with clipping of LA appendage via right mini thoracotomy  . Shortness of breath    seen by Stanford Breed, & low energy     Past Surgical History:  Procedure Laterality Date  .  CARDIAC CATHETERIZATION  06/2013  . CARDIOVERSION N/A 06/30/2013   Procedure: CARDIOVERSION;  Surgeon: [name], MD;  Location: [facility];  Service: Cardiovascular;  Laterality: N/A;  . CARDIOVERSION N/A 07/31/2013   Procedure: CARDIOVERSION;  Surgeon: Thayer Headings, MD;  Location: [facility];  Service: Cardiovascular;  Laterality: N/A;  . CLIPPING OF ATRIAL APPENDAGE  10/29/2013   Procedure: CLIPPING OF ATRIAL APPENDAGE;  Surgeon: Rexene Alberts, MD;  Location: [facility];  Service: Open Heart Surgery;;  . INTRAOPERATIVE TRANSESOPHAGEAL ECHOCARDIOGRAM N/A 10/29/2013   Procedure: INTRAOPERATIVE TRANSESOPHAGEAL ECHOCARDIOGRAM;  Surgeon: Rexene Alberts, MD;  Location: [facility];  Service: Open Heart Surgery;  Laterality: N/A;  . LEFT AND RIGHT HEART CATHETERIZATION WITH CORONARY ANGIOGRAM N/A 06/27/2013   Procedure: LEFT AND RIGHT HEART CATHETERIZATION WITH CORONARY ANGIOGRAM;  Surgeon: Jettie Booze, MD;  Location: [facility] CATH LAB;  Service: Cardiovascular;  Laterality: N/A;  . MEDIAL PARTIAL KNEE REPLACEMENT Left 1990's?  Marland Kitchen MINIMALLY INVASIVE MAZE PROCEDURE N/A 10/29/2013   Procedure: MINIMALLY INVASIVE MAZE PROCEDURE;  Surgeon: Rexene Alberts, MD;  Location: [place] [facility];  Service: Open Heart Surgery;  Laterality: N/A;  . TEE WITHOUT CARDIOVERSION N/A 06/30/2013   Procedure: TRANSESOPHAGEAL ECHOCARDIOGRAM (TEE) ;  Surgeon: [name], MD;  Location: [place];  Service: Cardiovascular;  Laterality: N/A;  . TRANSURETHRAL RESECTION OF PROSTATE  2010    Social History   Socioeconomic History  . Marital status: Widowed    Spouse name: Not on file  . Number of children: Not on file  . Years of education: Not on file  . Highest education level: Not on file  Occupational History  . Not on file  Social Needs  . Financial resource strain: Not on file  . Food insecurity:    Worry: Not on file    Inability: Not on file  . Transportation needs:    Medical: Not on file    Non-medical: Not on file    Tobacco Use  . Smoking status: Never Smoker  . Smokeless tobacco: Never Used  . Tobacco comment: 06/25/2013 "smoked very light; years and years ago"  Substance and Sexual Activity  . Alcohol use: Yes    Alcohol/week: 14.0 standard drinks    Types: 7 Glasses of wine, 7 Shots of liquor per week  . Drug use: No  . Sexual activity: Not Currently  Lifestyle  . Physical activity:    Days per week: Not on file    Minutes per session: Not on file  . Stress: Not on file  Relationships  . Social connections:    Talks on phone: Not on file    Gets together: Not on file    Attends religious service: Not on file    Active member of club or organization: Not on file    Attends meetings of clubs or organizations: Not on file  Relationship status: Not on file  . Intimate partner violence:    Fear of current or ex partner: Not on file    Emotionally abused: Not on file    Physically abused: Not on file    Forced sexual activity: Not on file  Other Topics Concern  . Not on file  Social History Narrative   He lives in [place]. He is widowed. Wife passed of breast cancer last year. No children.     Family History  Problem Relation Age of Onset  . Healthy Father   . Healthy Mother   . Healthy Sister     ROS: no fevers or chills, productive cough, hemoptysis, dysphasia, odynophagia, melena, hematochezia, dysuria, hematuria, rash, seizure activity, orthopnea, PND, pedal edema, claudication. Remaining systems are negative.  Physical Exam: Well-developed obese in no acute distress.  Skin is warm and dry.  HEENT is normal.  Neck is supple.  Chest is clear to auscultation with normal expansion.  Cardiovascular exam is regular rate and rhythm.  Abdominal exam nontender or distended. No masses palpated. Extremities show no edema. neuro grossly intact  ECG-sinus rhythm with PACs.  Right axis deviation.  Personally reviewed  A/P  1 paroxysmal atrial fibrillation-patient remains in  sinus rhythm today.  Continue beta-blocker for rate control if atrial fibrillation recurs.  Continue Eliquis.  Will have most recent hemoglobin and renal function forwarded for our records.  2 chronic diastolic congestive heart failure-he is not volume overloaded today.  Continue present dose of Lasix. Continue fluid restriction and low-sodium diet.  3 coronary artery disease-continue statin.  He is not on aspirin given need for anticoagulation.  4 hypertension-blood pressure is elevated.  Add amlodipine 5 mg daily.  Follow blood pressure and adjust regimen as needed.  5 hyperlipidemia-continue statin.  Lipids and liver monitored at the [place].  6 nonischemic cardiomyopathy-LV function improved on most recent echocardiogram.  Continue medical therapy with ACE inhibitor and beta-blocker.  Repeat echocardiogram.  7 history of penetrating atherosclerotic aortic ulcer-previously seen by Dr. Roxy Manns in follow-up imaging not felt necessary.  Kirk Ruths, MD

## 2017-12-05 ENCOUNTER — Ambulatory Visit (INDEPENDENT_AMBULATORY_CARE_PROVIDER_SITE_OTHER): Payer: Medicare Other | Admitting: Cardiology

## 2017-12-05 ENCOUNTER — Encounter: Payer: Self-pay | Admitting: Cardiology

## 2017-12-05 VITALS — BP 160/92 | HR 75 | Ht 72.0 in | Wt 262.0 lb

## 2017-12-05 DIAGNOSIS — I10 Essential (primary) hypertension: Secondary | ICD-10-CM

## 2017-12-05 DIAGNOSIS — I4819 Other persistent atrial fibrillation: Secondary | ICD-10-CM | POA: Diagnosis not present

## 2017-12-05 DIAGNOSIS — E7800 Pure hypercholesterolemia, unspecified: Secondary | ICD-10-CM | POA: Diagnosis not present

## 2017-12-05 DIAGNOSIS — I2510 Atherosclerotic heart disease of native coronary artery without angina pectoris: Secondary | ICD-10-CM

## 2017-12-05 DIAGNOSIS — I428 Other cardiomyopathies: Secondary | ICD-10-CM

## 2017-12-05 MED ORDER — AMLODIPINE BESYLATE 5 MG PO TABS: 5.0000 mg | ORAL_TABLET | Freq: Every day | ORAL | 6 refills | Status: DC

## 2017-12-05 MED ORDER — AMLODIPINE BESYLATE 5 MG PO TABS: 5.0000 mg | ORAL_TABLET | Freq: Every day | ORAL | 3 refills | Status: DC

## 2017-12-05 NOTE — Patient Instructions (Signed)
Medication Instructions:   START AMLODIPINE 5 MG ONCE DAILY  Testing/Procedures:  Your physician has requested that you have an echocardiogram. Echocardiography is a painless test that uses sound waves to create images of your heart. It provides your doctor with information about the size and shape of your heart and how well your heart's chambers and valves are working. This procedure takes approximately one hour. There are no restrictions for this procedure.  AT THE [place] [facility]  Follow-Up:  DR Stanford Breed WOULD LIKE TO SEE YOU BACK IN ONE YEAR. PLEASE CALL THE OFFICE 2 MONTHS PRIOR TO THE APPOINTMENT TIME TO SCHEDULE.

## 2017-12-06 ENCOUNTER — Other Ambulatory Visit: Payer: Self-pay | Admitting: *Deleted

## 2017-12-06 MED ORDER — AMLODIPINE BESYLATE 5 MG PO TABS: 5.0000 mg | ORAL_TABLET | Freq: Every day | ORAL | 3 refills | Status: DC

## 2017-12-10 ENCOUNTER — Ambulatory Visit (HOSPITAL_BASED_OUTPATIENT_CLINIC_OR_DEPARTMENT_OTHER)
Admission: RE | Admit: 2017-12-10 | Discharge: 2017-12-10 | Disposition: A | Discharge status: Home / Self Care | Payer: Medicare Other | Source: Ambulatory Visit | Attending: Cardiology | Admitting: Cardiology

## 2017-12-10 DIAGNOSIS — I4819 Other persistent atrial fibrillation: Secondary | ICD-10-CM | POA: Insufficient documentation

## 2017-12-10 DIAGNOSIS — I10 Essential (primary) hypertension: Secondary | ICD-10-CM | POA: Diagnosis not present

## 2017-12-10 DIAGNOSIS — I2510 Atherosclerotic heart disease of native coronary artery without angina pectoris: Secondary | ICD-10-CM | POA: Insufficient documentation

## 2017-12-10 DIAGNOSIS — E785 Hyperlipidemia, unspecified: Secondary | ICD-10-CM | POA: Diagnosis not present

## 2017-12-10 DIAGNOSIS — R0600 Dyspnea, unspecified: Secondary | ICD-10-CM | POA: Insufficient documentation

## 2017-12-10 DIAGNOSIS — I340 Nonrheumatic mitral (valve) insufficiency: Secondary | ICD-10-CM | POA: Insufficient documentation

## 2017-12-10 DIAGNOSIS — I4891 Unspecified atrial fibrillation: Secondary | ICD-10-CM | POA: Insufficient documentation

## 2017-12-10 NOTE — Progress Notes (Signed)
  Echocardiogram 2D Echocardiogram has been performed.  Jose Snyder T Berneda Piccininni 12/10/2017, 11:02 AM

## 2017-12-26 ENCOUNTER — Ambulatory Visit (INDEPENDENT_AMBULATORY_CARE_PROVIDER_SITE_OTHER): Payer: Medicare Other | Admitting: Podiatry

## 2017-12-26 ENCOUNTER — Encounter: Payer: Self-pay | Admitting: Podiatry

## 2017-12-26 ENCOUNTER — Telehealth: Payer: Self-pay | Admitting: *Deleted

## 2017-12-26 DIAGNOSIS — M79676 Pain in unspecified toe(s): Secondary | ICD-10-CM | POA: Diagnosis not present

## 2017-12-26 DIAGNOSIS — B351 Tinea unguium: Secondary | ICD-10-CM

## 2017-12-26 DIAGNOSIS — D689 Coagulation defect, unspecified: Secondary | ICD-10-CM | POA: Diagnosis not present

## 2017-12-26 NOTE — Telephone Encounter (Signed)
Called patient to inform that the Tolcylen antifungal solution is in and  may purchase at the cost of 59.00. Unable to  [employer].

## 2017-12-26 NOTE — Progress Notes (Signed)
Patient ID: Jose Snyder, male   DOB: [birth year], 77 y.o.   MRN: [id number] Complaint:  Visit Type: Patient returns to my office for continued preventative foot care services. Complaint: Patient states" my nails have grown long and thick and become painful to walk and wear shoes. The patient presents for preventative foot care services. He is attempting to lose wight. Patient is on eliquiss.  Podiatric Exam: Vascular: dorsalis pedis and posterior tibial pulses are palpable bilateral. Capillary return is immediate. Temperature gradient is WNL. Skin turgor WNL  Sensorium: Normal Semmes Weinstein monofilament test. Normal tactile sensation bilaterally. Nail Exam: Pt has thick disfigured discolored nails with subungual debris noted bilateral entire nail hallux through fifth toenails Ulcer Exam: There is no evidence of ulcer or pre-ulcerative changes or infection. Orthopedic Exam: Muscle tone and strength are WNL. No limitations in general ROM. No crepitus or effusions noted. Foot type and digits show no abnormalities. Bony prominences are unremarkable. Skin: No Porokeratosis. No infection or ulcers  Diagnosis:  Onychomycosis, , Pain in right toe, pain in left toes  Treatment & Plan Procedures and Treatment: Consent by patient was obtained for treatment procedures. The patient understood the discussion of treatment and procedures well. All questions were answered thoroughly reviewed. Debridement of mycotic and hypertrophic toenails, 1 through 5 bilateral and clearing of subungual debris. No ulceration, no infection noted. ABN signed for 2019. Return Visit-Office Procedure: Patient instructed to return to the office for a follow up visit 3 months for continued evaluation and treatment.  [name] DPM

## 2018-03-11 ENCOUNTER — Telehealth: Payer: Self-pay | Admitting: Cardiology

## 2018-03-11 NOTE — Progress Notes (Signed)
HPI: FU atrial fibrillation. Echo 4/15 EF 20-25%. LHC demonstrated mod non-obs CAD. Most significant lesion was a mid D1 with 75% stenosis. DCM was felt to be out of proportion to CAD (NICM). Had DCCV on amiodarone but did not hold sinus. He was seen by Dr. Rayann Heman and referred to Dr. Roxy Manns for surgical maze. Carotid Dopplers August 2015 showed 1-39% bilateral stenosis. Patient underwent maze procedure with clipping of left atrial appendage on October 29, 2013. CTA April 2018 showed focal saccular outpouching of the distal descending thoracic aorta with no intramural hematoma or inflammatory changes.  Findings felt possibly early ulcer formation and not a penetrating ulcer per se.  Images were reviewed by Dr. Roxy Manns and no further follow-up recommended.  Echocardiogram October 2019 showed normal LV function, moderate left ventricular hypertrophy,  mildly dilated ascending aorta and mild mitral regurgitation.  Since last seen, patient has some dyspnea on exertion unchanged.  No orthopnea, PND, pedal edema, chest pain, palpitations or syncope.  He has noticed recently that his heart rate has been elevated.  He therefore scheduled an appointment for evaluation.  Current Outpatient Medications  Medication Sig Dispense Refill  . amLODipine (NORVASC) 5 MG tablet Take 1 tablet (5 mg total) by mouth daily. 90 tablet 3  . apixaban (ELIQUIS) 5 MG TABS tablet Take 1 tablet (5 mg total) by mouth 2 (two) times daily. 60 tablet 11  . atorvastatin (LIPITOR) 80 MG tablet Take 1 tablet (80 mg total) by mouth daily with breakfast. 30 tablet 12  . betamethasone dipropionate (DIPROLENE) 0.05 % ointment Apply topically 2 (two) times daily.    . Cholecalciferol (VITAMIN D) 2000 UNITS tablet Take 2,000 Units by mouth daily.    Marland Kitchen docusate sodium (COLACE) 100 MG capsule Take 100 mg by mouth daily as needed for mild constipation.    . furosemide (LASIX) 40 MG tablet Take 1 tablet (40 mg total) by mouth 2 (two) times daily.  If swelling takes an extra dose 60 tablet 6  . ibuprofen (ADVIL,MOTRIN) 200 MG tablet Take 400 mg by mouth daily. Reported on 08/30/2015    . lisinopril (PRINIVIL,ZESTRIL) 20 MG tablet Take 1 tablet (20 mg total) by mouth daily with breakfast. 30 tablet 12  . metoprolol succinate (TOPROL-XL) 100 MG 24 hr tablet Take 1 tablet (100 mg total) by mouth daily. 30 tablet 12  . omeprazole (PRILOSEC) 20 MG capsule Take 20 mg by mouth daily.    . potassium chloride SA (K-DUR,KLOR-CON) 20 MEQ tablet Take 1 tablet (20 mEq total) by mouth daily. 30 tablet 11  . temazepam (RESTORIL) 15 MG capsule Take 15 mg by mouth at bedtime as needed.      No current facility-administered medications for this visit.      Past Medical History:  Diagnosis Date  . Anxiety   . Arthritis    R hand- OA  . Atrial Fibrillation  06/25/2013  . CAD (coronary artery disease), native coronary artery 06/28/2013   a. LHC (06/27/13):  Mid LAD 40%, mid D1 75%, mid CFX 50-60%, mid RCA 50%, EF 20%, LVEDP 24 mmHg.  . CHF (congestive heart failure) ([place])   . Chronic combined systolic and diastolic heart failure ([place]) 06/26/2013   a.  Echo (06/25/13):  EF 20-25%, diff HK, restrictive physio, mild MR, mod to severe LAE, mild RVE, mildly reduced RVSF, mod to severe RAE  . Chronic kidney disease   . Dyslipidemia 07/15/2013  . Dysrhythmia    a-fib  .  GERD (gastroesophageal reflux disease)   . Hypertension   . Incidental pulmonary nodule 06/22/2014   Seen on CT angiogram of chest  . NICM (nonischemic cardiomyopathy) with EF 20-25% 06/26/2013  . Obesity   . Penetrating atherosclerotic ulcer of aorta ([place]) 10/22/2013   Small penetrating ulcer of descending thoracic aorta discovered on routine CTA  . Prostate cancer ([place])    watchful waiting, no treatment so far  . S/P Maze operation for atrial fibrillation 10/29/2013   Complete bilateral atrial lesion set using cryothermy and bipolar radiofrequency ablation with clipping of LA appendage via right  mini thoracotomy  . Shortness of breath    seen by Stanford Breed, & low energy     Past Surgical History:  Procedure Laterality Date  . CARDIAC CATHETERIZATION  06/2013  . CARDIOVERSION N/A 06/30/2013   Procedure: CARDIOVERSION;  Surgeon: [name], MD;  Location: [facility];  Service: Cardiovascular;  Laterality: N/A;  . CARDIOVERSION N/A 07/31/2013   Procedure: CARDIOVERSION;  Surgeon: Thayer Headings, MD;  Location: [place];  Service: Cardiovascular;  Laterality: N/A;  . CLIPPING OF ATRIAL APPENDAGE  10/29/2013   Procedure: CLIPPING OF ATRIAL APPENDAGE;  Surgeon: Rexene Alberts, MD;  Location: [place];  Service: Open Heart Surgery;;  . INTRAOPERATIVE TRANSESOPHAGEAL ECHOCARDIOGRAM N/A 10/29/2013   Procedure: INTRAOPERATIVE TRANSESOPHAGEAL ECHOCARDIOGRAM;  Surgeon: Rexene Alberts, MD;  Location: [place];  Service: Open Heart Surgery;  Laterality: N/A;  . LEFT AND RIGHT HEART CATHETERIZATION WITH CORONARY ANGIOGRAM N/A 06/27/2013   Procedure: LEFT AND RIGHT HEART CATHETERIZATION WITH CORONARY ANGIOGRAM;  Surgeon: Jettie Booze, MD;  Location[facility] CATH LAB;  Service: Cardiovascular;  Laterality: N/A;  . MEDIAL PARTIAL KNEE REPLACEMENT Left 1990's?  Marland Kitchen MINIMALLY INVASIVE MAZE PROCEDURE N/A 10/29/2013   Procedure: MINIMALLY INVASIVE MAZE PROCEDURE;  Surgeon: Rexene Alberts, MD;  Location: [facility];  Service: Open Heart Surgery;  Laterality: N/A;  . TEE WITHOUT CARDIOVERSION N/A 06/30/2013   Procedure: TRANSESOPHAGEAL ECHOCARDIOGRAM (TEE) ;  Surgeon: [name], MD;  Location: [facility];  Service: Cardiovascular;  Laterality: N/A;  . TRANSURETHRAL RESECTION OF PROSTATE  2010    Social History   Socioeconomic History  . Marital status: Widowed    Spouse name: Not on file  . Number of children: Not on file  . Years of education: Not on file  . Highest education level: Not on file  Occupational History  . Not on file  Social Needs  . Financial resource strain: Not on file  . Food  insecurity:    Worry: Not on file    Inability: Not on file  . Transportation needs:    Medical: Not on file    Non-medical: Not on file  Tobacco Use  . Smoking status: Never Smoker  . Smokeless tobacco: Never Used  . Tobacco comment: 06/25/2013 "smoked very light; years and years ago"  Substance and Sexual Activity  . Alcohol use: Yes    Alcohol/week: 14.0 standard drinks    Types: 7 Glasses of wine, 7 Shots of liquor per week  . Drug use: No  . Sexual activity: Not Currently  Lifestyle  . Physical activity:    Days per week: Not on file    Minutes per session: Not on file  . Stress: Not on file  Relationships  . Social connections:    Talks on phone: Not on file    Gets together: Not on file    Attends religious service: Not on file  Active member of club or organization: Not on file    Attends meetings of clubs or organizations: Not on file    Relationship status: Not on file  . Intimate partner violence:    Fear of current or ex partner: Not on file    Emotionally abused: Not on file    Physically abused: Not on file    Forced sexual activity: Not on file  Other Topics Concern  . Not on file  Social History Narrative   He lives in [place]. He is widowed. Wife passed of breast cancer last year. No children.     Family History  Problem Relation Age of Onset  . Healthy Father   . Healthy Mother   . Healthy Sister     ROS: no fevers or chills, productive cough, hemoptysis, dysphasia, odynophagia, melena, hematochezia, dysuria, hematuria, rash, seizure activity, orthopnea, PND, pedal edema, claudication. Remaining systems are negative.  Physical Exam: Well-developed well-nourished in no acute distress.  Skin is warm and dry.  HEENT is normal.  Neck is supple.  Chest is clear to auscultation with normal expansion.  Cardiovascular exam is regular and tachycardic Abdominal exam nontender or distended. No masses palpated. Extremities show no edema. neuro  grossly intact  ECG-probable atypical atrial flutter, low voltage.  Personally reviewed  A/P  1 paroxysmal atrial fibrillation/flutter-patient appears to be in atypical atrial flutter today.  Continue apixaban.  Patient has been compliant.  Increase metoprolol to 125 mg daily.  We will arrange cardioversion.  If he has recurrent arrhythmias in the future we will consider addition of antiarrhythmic vs rate control.  2 chronic diastolic congestive heart failure-he is doing well from a volume standpoint.  We will continue with present dose of diuretic.  Continue fluid restriction and low-sodium diet.  3 coronary artery disease-continue statin.  No aspirin given need for anticoagulation.  He is not having chest pain.  4 hypertension-patient's blood pressure is controlled.  Continue present medications and follow.  5 hyperlipidemia-continue statin.  Patient's lipids and liver are followed at the [place].  6 history of nonischemic cardiomyopathy-resolved on most recent echocardiogram.  Continue ACE inhibitor and beta-blocker.  7 history of penetrating atherosclerotic aortic ulcer-as outlined in previous notes he was seen by Dr. Roxy Manns and follow-up imaging was felt unnecessary.  Kirk Ruths, MD

## 2018-03-11 NOTE — Telephone Encounter (Signed)
Patient called stating his HR over the weekend and today is beating too fast and wanted to get in.

## 2018-03-11 NOTE — Telephone Encounter (Signed)
Spoke with pt, for over the last week he has noticed his heart rate is elevated on his bp monitor. He has a new wrist bp cuff and it has been readings his heart rate at 124 bpm. He feels fine with no chest pain, SOB or palpitations. He continues to take metoprolol 100 mg once daily. His bp is all over the [facility] but he is not used to the new bp monitor and will bring the cuff to his follow up appointment. Follow up scheduled Wednesday this week in [place].

## 2018-03-11 NOTE — H&P (View-Only) (Signed)
HPI: FU atrial fibrillation. Echo 4/15 EF 20-25%. LHC demonstrated mod non-obs CAD. Most significant lesion was a mid D1 with 75% stenosis. DCM was felt to be out of proportion to CAD (NICM). Had DCCV on amiodarone but did not hold sinus. He was seen by Dr. Rayann Heman and referred to Dr. Roxy Manns for surgical maze. Carotid Dopplers August 2015 showed 1-39% bilateral stenosis. Patient underwent maze procedure with clipping of left atrial appendage on October 29, 2013. CTA April 2018 showed focal saccular outpouching of the distal descending thoracic aorta with no intramural hematoma or inflammatory changes.  Findings felt possibly early ulcer formation and not a penetrating ulcer per se.  Images were reviewed by Dr. Roxy Manns and no further follow-up recommended.  Echocardiogram October 2019 showed normal LV function, moderate left ventricular hypertrophy,  mildly dilated ascending aorta and mild mitral regurgitation.  Since last seen, patient has some dyspnea on exertion unchanged.  No orthopnea, PND, pedal edema, chest pain, palpitations or syncope.  He has noticed recently that his heart rate has been elevated.  He therefore scheduled an appointment for evaluation.  Current Outpatient Medications  Medication Sig Dispense Refill  . amLODipine (NORVASC) 5 MG tablet Take 1 tablet (5 mg total) by mouth daily. 90 tablet 3  . apixaban (ELIQUIS) 5 MG TABS tablet Take 1 tablet (5 mg total) by mouth 2 (two) times daily. 60 tablet 11  . atorvastatin (LIPITOR) 80 MG tablet Take 1 tablet (80 mg total) by mouth daily with breakfast. 30 tablet 12  . betamethasone dipropionate (DIPROLENE) 0.05 % ointment Apply topically 2 (two) times daily.    . Cholecalciferol (VITAMIN D) 2000 UNITS tablet Take 2,000 Units by mouth daily.    Marland Kitchen docusate sodium (COLACE) 100 MG capsule Take 100 mg by mouth daily as needed for mild constipation.    . furosemide (LASIX) 40 MG tablet Take 1 tablet (40 mg total) by mouth 2 (two) times daily.  If swelling takes an extra dose 60 tablet 6  . ibuprofen (ADVIL,MOTRIN) 200 MG tablet Take 400 mg by mouth daily. Reported on 08/30/2015    . lisinopril (PRINIVIL,ZESTRIL) 20 MG tablet Take 1 tablet (20 mg total) by mouth daily with breakfast. 30 tablet 12  . metoprolol succinate (TOPROL-XL) 100 MG 24 hr tablet Take 1 tablet (100 mg total) by mouth daily. 30 tablet 12  . omeprazole (PRILOSEC) 20 MG capsule Take 20 mg by mouth daily.    . potassium chloride SA (K-DUR,KLOR-CON) 20 MEQ tablet Take 1 tablet (20 mEq total) by mouth daily. 30 tablet 11  . temazepam (RESTORIL) 15 MG capsule Take 15 mg by mouth at bedtime as needed.      No current facility-administered medications for this visit.      Past Medical History:  Diagnosis Date  . Anxiety   . Arthritis    R hand- OA  . Atrial Fibrillation  06/25/2013  . CAD (coronary artery disease), native coronary artery 06/28/2013   a. LHC (06/27/13):  Mid LAD 40%, mid D1 75%, mid CFX 50-60%, mid RCA 50%, EF 20%, LVEDP 24 mmHg.  . CHF (congestive heart failure) ([place])   . Chronic combined systolic and diastolic heart failure ([place]) 06/26/2013   a.  Echo (06/25/13):  EF 20-25%, diff HK, restrictive physio, mild MR, mod to severe LAE, mild RVE, mildly reduced RVSF, mod to severe RAE  . Chronic kidney disease   . Dyslipidemia 07/15/2013  . Dysrhythmia    a-fib  .  GERD (gastroesophageal reflux disease)   . Hypertension   . Incidental pulmonary nodule 06/22/2014   Seen on CT angiogram of chest  . NICM (nonischemic cardiomyopathy) with EF 20-25% 06/26/2013  . Obesity   . Penetrating atherosclerotic ulcer of aorta ([place]) 10/22/2013   Small penetrating ulcer of descending thoracic aorta discovered on routine CTA  . Prostate cancer ([place])    watchful waiting, no treatment so far  . S/P Maze operation for atrial fibrillation 10/29/2013   Complete bilateral atrial lesion set using cryothermy and bipolar radiofrequency ablation with clipping of LA appendage via right  mini thoracotomy  . Shortness of breath    seen by Stanford Breed, & low energy     Past Surgical History:  Procedure Laterality Date  . CARDIAC CATHETERIZATION  06/2013  . CARDIOVERSION N/A 06/30/2013   Procedure: CARDIOVERSION;  Surgeon: [name], MD;  Location: [facility];  Service: Cardiovascular;  Laterality: N/A;  . CARDIOVERSION N/A 07/31/2013   Procedure: CARDIOVERSION;  Surgeon: Thayer Headings, MD;  Location: [facility];  Service: Cardiovascular;  Laterality: N/A;  . CLIPPING OF ATRIAL APPENDAGE  10/29/2013   Procedure: CLIPPING OF ATRIAL APPENDAGE;  Surgeon: Rexene Alberts, MD;  Location: [facility];  Service: Open Heart Surgery;;  . INTRAOPERATIVE TRANSESOPHAGEAL ECHOCARDIOGRAM N/A 10/29/2013   Procedure: INTRAOPERATIVE TRANSESOPHAGEAL ECHOCARDIOGRAM;  Surgeon: Rexene Alberts, MD;  Location: [facility];  Service: Open Heart Surgery;  Laterality: N/A;  . LEFT AND RIGHT HEART CATHETERIZATION WITH CORONARY ANGIOGRAM N/A 06/27/2013   Procedure: LEFT AND RIGHT HEART CATHETERIZATION WITH CORONARY ANGIOGRAM;  Surgeon: Jettie Booze, MD;  Location[facility];  Service: Cardiovascular;  Laterality: N/A;  . MEDIAL PARTIAL KNEE REPLACEMENT Left 1990's?  Marland Kitchen MINIMALLY INVASIVE MAZE PROCEDURE N/A 10/29/2013   Procedure: MINIMALLY INVASIVE MAZE PROCEDURE;  Surgeon: Rexene Alberts, MD;  Location: [facility];  Service: Open Heart Surgery;  Laterality: N/A;  . TEE WITHOUT CARDIOVERSION N/A 06/30/2013   Procedure: TRANSESOPHAGEAL ECHOCARDIOGRAM (TEE) ;  Surgeon: [name], MD;  Location: [facility];  Service: Cardiovascular;  Laterality: N/A;  . TRANSURETHRAL RESECTION OF PROSTATE  2010    Social History   Socioeconomic History  . Marital status: Widowed    Spouse name: Not on file  . Number of children: Not on file  . Years of education: Not on file  . Highest education level: Not on file  Occupational History  . Not on file  Social Needs  . Financial resource strain: Not on file  . Food  insecurity:    Worry: Not on file    Inability: Not on file  . Transportation needs:    Medical: Not on file    Non-medical: Not on file  Tobacco Use  . Smoking status: Never Smoker  . Smokeless tobacco: Never Used  . Tobacco comment: 06/25/2013 "smoked very light; years and years ago"  Substance and Sexual Activity  . Alcohol use: Yes    Alcohol/week: 14.0 standard drinks    Types: 7 Glasses of wine, 7 Shots of liquor per week  . Drug use: No  . Sexual activity: Not Currently  Lifestyle  . Physical activity:    Days per week: Not on file    Minutes per session: Not on file  . Stress: Not on file  Relationships  . Social connections:    Talks on phone: Not on file    Gets together: Not on file    Attends religious service: Not on file  Active member of club or organization: Not on file    Attends meetings of clubs or organizations: Not on file    Relationship status: Not on file  . Intimate partner violence:    Fear of current or ex partner: Not on file    Emotionally abused: Not on file    Physically abused: Not on file    Forced sexual activity: Not on file  Other Topics Concern  . Not on file  Social History Narrative   He lives in [place]. He is widowed. Wife passed of breast cancer last year. No children.     Family History  Problem Relation Age of Onset  . Healthy Father   . Healthy Mother   . Healthy Sister     ROS: no fevers or chills, productive cough, hemoptysis, dysphasia, odynophagia, melena, hematochezia, dysuria, hematuria, rash, seizure activity, orthopnea, PND, pedal edema, claudication. Remaining systems are negative.  Physical Exam: Well-developed well-nourished in no acute distress.  Skin is warm and dry.  HEENT is normal.  Neck is supple.  Chest is clear to auscultation with normal expansion.  Cardiovascular exam is regular and tachycardic Abdominal exam nontender or distended. No masses palpated. Extremities show no edema. neuro  grossly intact  ECG-probable atypical atrial flutter, low voltage.  Personally reviewed  A/P  1 paroxysmal atrial fibrillation/flutter-patient appears to be in atypical atrial flutter today.  Continue apixaban.  Patient has been compliant.  Increase metoprolol to 125 mg daily.  We will arrange cardioversion.  If he has recurrent arrhythmias in the future we will consider addition of antiarrhythmic vs rate control.  2 chronic diastolic congestive heart failure-he is doing well from a volume standpoint.  We will continue with present dose of diuretic.  Continue fluid restriction and low-sodium diet.  3 coronary artery disease-continue statin.  No aspirin given need for anticoagulation.  He is not having chest pain.  4 hypertension-patient's blood pressure is controlled.  Continue present medications and follow.  5 hyperlipidemia-continue statin.  Patient's lipids and liver are followed at the [facility].  6 history of nonischemic cardiomyopathy-resolved on most recent echocardiogram.  Continue ACE inhibitor and beta-blocker.  7 history of penetrating atherosclerotic aortic ulcer-as outlined in previous notes he was seen by Dr. Roxy Manns and follow-up imaging was felt unnecessary.  Kirk Ruths, MD

## 2018-03-13 ENCOUNTER — Ambulatory Visit (INDEPENDENT_AMBULATORY_CARE_PROVIDER_SITE_OTHER): Payer: Medicare Other | Admitting: Cardiology

## 2018-03-13 ENCOUNTER — Encounter: Payer: Self-pay | Admitting: Cardiology

## 2018-03-13 VITALS — BP 128/82 | HR 127 | Ht 72.0 in | Wt 266.0 lb

## 2018-03-13 DIAGNOSIS — I428 Other cardiomyopathies: Secondary | ICD-10-CM

## 2018-03-13 DIAGNOSIS — I480 Paroxysmal atrial fibrillation: Secondary | ICD-10-CM | POA: Diagnosis not present

## 2018-03-13 DIAGNOSIS — I2510 Atherosclerotic heart disease of native coronary artery without angina pectoris: Secondary | ICD-10-CM

## 2018-03-13 DIAGNOSIS — I10 Essential (primary) hypertension: Secondary | ICD-10-CM | POA: Diagnosis not present

## 2018-03-13 DIAGNOSIS — I484 Atypical atrial flutter: Secondary | ICD-10-CM | POA: Diagnosis not present

## 2018-03-13 MED ORDER — METOPROLOL SUCCINATE ER 25 MG PO TB24: 25.0000 mg | ORAL_TABLET | Freq: Every day | ORAL | 3 refills | Status: DC

## 2018-03-13 NOTE — Patient Instructions (Addendum)
Medication Instructions:   INCREASE METOPROLOL SUCC ER 25 MG ONCE DAILY WITH THE 100 MG TABLET  You are scheduled for a  Cardioversion on 03-19-2018 with Dr. Radford Pax.  Please arrive at the [facility] (Main Entrance A) at [facility]: [address] [place], [place] [address] at 1:30 pm. (1 hour prior to procedure unless lab work is needed; if lab work is needed arrive 1.5 hours ahead)  DIET: Nothing to eat or drink after midnight except a sip of water with medications (see medication instructions below)  Medication Instructions: Hold METOPROLOL THE MORNING OF THE PROCEDURE  Continue your anticoagulant: ELIQUIS You will need to continue your anticoagulant after your procedure until you  are told by your  Provider that it is safe to stop  You must have a responsible person to drive you home and stay in the waiting area during your procedure. Failure to do so could result in cancellation.  Bring your insurance cards.  *Special Note: Every effort is made to have your procedure done on time. Occasionally there are emergencies that occur at the hospital that may cause delays. Please be patient if a delay does occur.    Your physician recommends that you schedule a follow-up appointment in: [place]

## 2018-03-19 ENCOUNTER — Ambulatory Visit (HOSPITAL_COMMUNITY): Payer: Medicare Other | Admitting: Registered Nurse

## 2018-03-19 ENCOUNTER — Encounter (HOSPITAL_COMMUNITY)
Admission: RE | Disposition: A | Discharge status: Home / Self Care | Payer: Self-pay | Source: Home / Self Care | Attending: Cardiology

## 2018-03-19 ENCOUNTER — Encounter (HOSPITAL_COMMUNITY): Payer: Self-pay | Admitting: Registered Nurse

## 2018-03-19 ENCOUNTER — Ambulatory Visit (HOSPITAL_COMMUNITY)
Admission: RE | Admit: 2018-03-19 | Discharge: 2018-03-19 | Disposition: A | Discharge status: Home / Self Care | Payer: Medicare Other | Attending: Cardiology | Admitting: Cardiology

## 2018-03-19 ENCOUNTER — Other Ambulatory Visit: Payer: Self-pay

## 2018-03-19 DIAGNOSIS — I480 Paroxysmal atrial fibrillation: Secondary | ICD-10-CM | POA: Diagnosis not present

## 2018-03-19 DIAGNOSIS — K219 Gastro-esophageal reflux disease without esophagitis: Secondary | ICD-10-CM | POA: Diagnosis not present

## 2018-03-19 DIAGNOSIS — Z7901 Long term (current) use of anticoagulants: Secondary | ICD-10-CM | POA: Insufficient documentation

## 2018-03-19 DIAGNOSIS — I4892 Unspecified atrial flutter: Secondary | ICD-10-CM | POA: Insufficient documentation

## 2018-03-19 DIAGNOSIS — F419 Anxiety disorder, unspecified: Secondary | ICD-10-CM | POA: Insufficient documentation

## 2018-03-19 DIAGNOSIS — Z79899 Other long term (current) drug therapy: Secondary | ICD-10-CM | POA: Insufficient documentation

## 2018-03-19 DIAGNOSIS — I130 Hypertensive heart and chronic kidney disease with heart failure and stage 1 through stage 4 chronic kidney disease, or unspecified chronic kidney disease: Secondary | ICD-10-CM | POA: Diagnosis not present

## 2018-03-19 DIAGNOSIS — N189 Chronic kidney disease, unspecified: Secondary | ICD-10-CM | POA: Insufficient documentation

## 2018-03-19 DIAGNOSIS — E669 Obesity, unspecified: Secondary | ICD-10-CM | POA: Insufficient documentation

## 2018-03-19 DIAGNOSIS — Z8546 Personal history of malignant neoplasm of prostate: Secondary | ICD-10-CM | POA: Insufficient documentation

## 2018-03-19 DIAGNOSIS — I428 Other cardiomyopathies: Secondary | ICD-10-CM | POA: Insufficient documentation

## 2018-03-19 DIAGNOSIS — I2510 Atherosclerotic heart disease of native coronary artery without angina pectoris: Secondary | ICD-10-CM | POA: Insufficient documentation

## 2018-03-19 DIAGNOSIS — I4891 Unspecified atrial fibrillation: Secondary | ICD-10-CM | POA: Diagnosis not present

## 2018-03-19 DIAGNOSIS — I484 Atypical atrial flutter: Secondary | ICD-10-CM

## 2018-03-19 DIAGNOSIS — I5042 Chronic combined systolic (congestive) and diastolic (congestive) heart failure: Secondary | ICD-10-CM | POA: Diagnosis not present

## 2018-03-19 DIAGNOSIS — E785 Hyperlipidemia, unspecified: Secondary | ICD-10-CM | POA: Diagnosis not present

## 2018-03-19 DIAGNOSIS — Z791 Long term (current) use of non-steroidal anti-inflammatories (NSAID): Secondary | ICD-10-CM | POA: Diagnosis not present

## 2018-03-19 HISTORY — PX: CARDIOVERSION: SHX1299

## 2018-03-19 LAB — POCT I-STAT 4, (NA,K, GLUC, HGB,HCT)
Glucose, Bld: 134 mg/dL — ABNORMAL HIGH (ref 70–99)
HEMATOCRIT: 42 % (ref 39.0–52.0)
Hemoglobin: 14.3 g/dL (ref 13.0–17.0)
Potassium: 4.2 mmol/L (ref 3.5–5.1)
Sodium: 137 mmol/L (ref 135–145)

## 2018-03-19 SURGERY — CARDIOVERSION
Anesthesia: General

## 2018-03-19 MED ORDER — LIDOCAINE 2% (20 MG/ML) 5 ML SYRINGE
INTRAMUSCULAR | Status: DC | PRN
  Administered 2018-03-19: 100 mg via INTRAVENOUS

## 2018-03-19 MED ORDER — SODIUM CHLORIDE 0.9 % IV SOLN
250.0000 mL | INTRAVENOUS | Status: DC
  Administered 2018-03-19: 14:00:00 via INTRAVENOUS

## 2018-03-19 MED ORDER — PROPOFOL 10 MG/ML IV BOLUS
INTRAVENOUS | Status: DC | PRN
  Administered 2018-03-19: 70 mg via INTRAVENOUS

## 2018-03-19 MED ORDER — SODIUM CHLORIDE 0.9% FLUSH: 3.0000 mL | INTRAVENOUS | Status: DC | PRN

## 2018-03-19 MED ORDER — SODIUM CHLORIDE 0.9% FLUSH: 3.0000 mL | Freq: Two times a day (BID) | INTRAVENOUS | Status: DC

## 2018-03-19 NOTE — Interval H&P Note (Signed)
History and Physical Interval Note:  03/19/2018 2:22 PM  Jose Snyder  has presented today for surgery, with the diagnosis of A-FIB  The various methods of treatment have been discussed with the patient and family. After consideration of risks, benefits and other options for treatment, the patient has consented to  Procedure(s): CARDIOVERSION (N/A) as a surgical intervention .  The patient's history has been reviewed, patient examined, no change in status, stable for surgery.  I have reviewed the patient's chart and labs.  Questions were answered to the patient's satisfaction.     Fransico Him

## 2018-03-19 NOTE — Anesthesia Preprocedure Evaluation (Signed)
Anesthesia Evaluation  Patient identified by MRN, date of birth, ID band Patient awake    Reviewed: Allergy & Precautions, H&P , NPO status , Patient's Chart, lab work & pertinent test results, Unable to perform ROS - Chart review only  Airway Mallampati: II  TM Distance: >3 FB Neck ROM: Full    Dental no notable dental hx. (+) Dental Advisory Given, Edentulous Upper   Pulmonary neg pulmonary ROS,    Pulmonary exam normal breath sounds clear to auscultation       Cardiovascular hypertension, + CAD  Normal cardiovascular exam+ dysrhythmias Atrial Fibrillation  Rhythm:Regular Rate:Normal  12/10/17 Echo  Left ventricle: The cavity size was normal. Wall thickness was   increased in a pattern of moderate LVH. Systolic function was   normal. The estimated ejection fraction was in the range of 55%   to 60%. Wall motion was normal; there were no regional wall   motion abnormalities. The study is not technically sufficient to   allow evaluation of LV diastolic function. - Ascending aorta: The ascending aorta was moderately dilated. - Mitral valve: There was mild regurgitation.   Neuro/Psych Anxiety negative neurological ROS     GI/Hepatic negative GI ROS, Neg liver ROS,   Endo/Other    Renal/GU Cr 1.42     Musculoskeletal  (+) Arthritis ,   Abdominal (+) + obese,   Peds  Hematology negative hematology ROS (+)   Anesthesia Other Findings   Reproductive/Obstetrics                             Lab Results  Component Value Date   CREATININE 1.42 (H) 11/16/2015   BUN 17 11/16/2015   NA 137 11/16/2015   K 4.7 11/16/2015   CL 103 11/16/2015   CO2 26 11/16/2015    Lab Results  Component Value Date   WBC 4.9 11/16/2015   HGB 12.9 (L) 11/16/2015   HCT 38.1 (L) 11/16/2015   MCV 89.9 11/16/2015   PLT 197 11/16/2015    Anesthesia Physical Anesthesia Plan  ASA: II  Anesthesia Plan: General    Post-op Pain Management:    Induction: Intravenous  PONV Risk Score and Plan:   Airway Management Planned: Nasal Cannula and Natural Airway  Additional Equipment:   Intra-op Plan:   Post-operative Plan:   Informed Consent: I have reviewed the patients History and Physical, chart, labs and discussed the procedure including the risks, benefits and alternatives for the proposed anesthesia with the patient or authorized representative who has indicated his/her understanding and acceptance.     Dental advisory given  Plan Discussed with: CRNA  Anesthesia Plan Comments:         Anesthesia Quick Evaluation

## 2018-03-19 NOTE — Anesthesia Postprocedure Evaluation (Signed)
Anesthesia Post Note  Patient: Jose Snyder  Procedure(s) Performed: CARDIOVERSION (N/A )     Patient location during evaluation: Endoscopy Anesthesia Type: General Level of consciousness: awake and alert Pain management: pain level controlled Vital Signs Assessment: post-procedure vital signs reviewed and stable Respiratory status: spontaneous breathing, nonlabored ventilation, respiratory function stable and patient connected to nasal cannula oxygen Cardiovascular status: blood pressure returned to baseline and stable Postop Assessment: no apparent nausea or vomiting Anesthetic complications: no    Last Vitals:  Vitals:   03/19/18 1440 03/19/18 1450  BP: 117/73 132/85  Pulse: 83 83  Resp: (!) 22 (!) 21  Temp: 36.6 C   SpO2: 96% 97%    Last Pain:  Vitals:   03/19/18 1500  TempSrc:   PainSc: 0-No pain                 Barnet Glasgow

## 2018-03-19 NOTE — Transfer of Care (Signed)
Immediate Anesthesia Transfer of Care Note  Patient: Jose Snyder  Procedure(s) Performed: CARDIOVERSION (N/A )  Patient Location: Endoscopy Unit  Anesthesia Type:General  Level of Consciousness: awake, alert  and oriented  Airway & Oxygen Therapy: Patient Spontanous Breathing  Post-op Assessment: Report given to RN and Post -op Vital signs reviewed and stable  Post vital signs: Reviewed and stable  Last Vitals:  Vitals Value Taken Time  BP 117/73 03/19/2018  2:37 PM  Temp    Pulse 79 03/19/2018  2:39 PM  Resp 26 03/19/2018  2:39 PM  SpO2 97 % 03/19/2018  2:39 PM    Last Pain:  Vitals:   03/19/18 1335  TempSrc: Oral  PainSc: 0-No pain         Complications: No apparent anesthesia complications

## 2018-03-19 NOTE — Discharge Instructions (Signed)

## 2018-03-19 NOTE — CV Procedure (Signed)
   Electrical Cardioversion Procedure Note Jose Snyder [id number] Jun 27, 1940  Procedure: Electrical Cardioversion Indications:  Atrial Flutter  Time Out: Verified patient identification, verified procedure,medications/allergies/relevent history reviewed, required imaging and test results available.  Performed  Procedure Details  The patient was NPO after midnight. Anesthesia was administered at the beside  by Dr.Houser with 70mg  of propofol and 100mg  Lidodaine.  Cardioversion was done with synchronized biphasic defibrillation with AP pads with 150watts.  The patient converted to normal sinus rhythm. The patient tolerated the procedure well   IMPRESSION:  Successful cardioversion of atria flutter    Jose Snyder 03/19/2018, 2:19 PM

## 2018-03-20 ENCOUNTER — Encounter (HOSPITAL_COMMUNITY): Payer: Self-pay | Admitting: Cardiology

## 2018-03-27 ENCOUNTER — Encounter: Payer: Self-pay | Admitting: Podiatry

## 2018-03-27 ENCOUNTER — Ambulatory Visit (INDEPENDENT_AMBULATORY_CARE_PROVIDER_SITE_OTHER): Payer: Medicare Other | Admitting: Podiatry

## 2018-03-27 DIAGNOSIS — D689 Coagulation defect, unspecified: Secondary | ICD-10-CM

## 2018-03-27 DIAGNOSIS — M79676 Pain in unspecified toe(s): Secondary | ICD-10-CM

## 2018-03-27 DIAGNOSIS — B351 Tinea unguium: Secondary | ICD-10-CM

## 2018-03-27 NOTE — Progress Notes (Signed)
Patient ID: Jose Snyder, male   DOB: [birth year], 78 y.o.   MRN: [id number] Complaint:  Visit Type: Patient returns to my office for continued preventative foot care services. Complaint: Patient states" my nails have grown long and thick and become painful to walk and wear shoes. The patient presents for preventative foot care services. He is attempting to lose wight. Patient is on eliquiss.  Podiatric Exam: Vascular: dorsalis pedis and posterior tibial pulses are palpable bilateral. Capillary return is immediate. Temperature gradient is WNL. Skin turgor WNL  Sensorium: Normal Semmes Weinstein monofilament test. Normal tactile sensation bilaterally. Nail Exam: Pt has thick disfigured discolored nails with subungual debris noted bilateral entire nail hallux through fifth toenails Ulcer Exam: There is no evidence of ulcer or pre-ulcerative changes or infection. Orthopedic Exam: Muscle tone and strength are WNL. No limitations in general ROM. No crepitus or effusions noted. Foot type and digits show no abnormalities. Bony prominences are unremarkable. Skin: No Porokeratosis. No infection or ulcers  Diagnosis:  Onychomycosis, , Pain in right toe, pain in left toes  Treatment & Plan Procedures and Treatment: Consent by patient was obtained for treatment procedures. The patient understood the discussion of treatment and procedures well. All questions were answered thoroughly reviewed. Debridement of mycotic and hypertrophic toenails, 1 through 5 bilateral and clearing of subungual debris. No ulceration, no infection noted.  Return Visit-Office Procedure: Patient instructed to return to the office for a follow up visit 3 months for continued evaluation and treatment.  [name] DPM

## 2018-04-10 NOTE — Progress Notes (Signed)
HPI: FU atrial fibrillation. Echo 4/15 EF 20-25%. LHC demonstrated mod non-obs CAD. Most significant lesion was a mid D1 with 75% stenosis. DCM was felt to be out of proportion to CAD (NICM). Had DCCV on amiodarone but did not hold sinus. He was seen by Dr. Rayann Heman and referred to Dr. Roxy Manns for surgical maze. Carotid Dopplers August 2015 showed 1-39% bilateral stenosis. Patient underwent maze procedure with clipping of left atrial appendage on October 29, 2013.CTA April 2018 showed focal saccular outpouching of the distal descending thoracic aorta with no intramural hematoma or inflammatory changes. Findings felt possibly early ulcer formation and not a penetrating ulcer per se. Images were reviewed by Dr. Roxy Manns and no further follow-up recommended.  Echocardiogram October 2019 showed normal LV function, moderate left ventricular hypertrophy, mildly dilated ascending aorta and mild mitral regurgitation.    Patient found to be in atrial flutter at last office visit.  Had successful cardioversion on March 19, 2018.  Since last seen,  he has some dyspnea on exertion but no orthopnea, PND, pedal edema, chest pain, palpitations or syncope.  No bleeding.  Current Outpatient Medications  Medication Sig Dispense Refill  . amLODipine (NORVASC) 5 MG tablet Take 1 tablet (5 mg total) by mouth daily. 90 tablet 3  . apixaban (ELIQUIS) 5 MG TABS tablet Take 1 tablet (5 mg total) by mouth 2 (two) times daily. 60 tablet 11  . atorvastatin (LIPITOR) 80 MG tablet Take 1 tablet (80 mg total) by mouth daily with breakfast. 30 tablet 12  . docusate sodium (COLACE) 100 MG capsule Take 100 mg by mouth daily as needed for mild constipation.    . furosemide (LASIX) 40 MG tablet Take 1 tablet (40 mg total) by mouth 2 (two) times daily. If swelling takes an extra dose 60 tablet 6  . ibuprofen (ADVIL,MOTRIN) 200 MG tablet Take 400 mg by mouth every 6 (six) hours as needed for headache or moderate pain.     Marland Kitchen lisinopril  (PRINIVIL,ZESTRIL) 40 MG tablet Take 40 mg by mouth daily.    . metoprolol (TOPROL-XL) 200 MG 24 hr tablet Take 100 mg by mouth daily.    . metoprolol succinate (TOPROL XL) 25 MG 24 hr tablet Take 1 tablet (25 mg total) by mouth daily. 90 tablet 3  . omeprazole (PRILOSEC) 20 MG capsule Take 20 mg by mouth daily.    . potassium chloride SA (K-DUR,KLOR-CON) 20 MEQ tablet Take 1 tablet (20 mEq total) by mouth daily. 30 tablet 11  . temazepam (RESTORIL) 15 MG capsule Take 15 mg by mouth at bedtime as needed for sleep.      No current facility-administered medications for this visit.      Past Medical History:  Diagnosis Date  . Anxiety   . Arthritis    R hand- OA  . Atrial Fibrillation  06/25/2013  . CAD (coronary artery disease), native coronary artery 06/28/2013   a. LHC (06/27/13):  Mid LAD 40%, mid D1 75%, mid CFX 50-60%, mid RCA 50%, EF 20%, LVEDP 24 mmHg.  . CHF (congestive heart failure) ([place])   . Chronic combined systolic and diastolic heart failure ([place]) 06/26/2013   a.  Echo (06/25/13):  EF 20-25%, diff HK, restrictive physio, mild MR, mod to severe LAE, mild RVE, mildly reduced RVSF, mod to severe RAE  . Chronic kidney disease   . Dyslipidemia 07/15/2013  . Dysrhythmia    a-fib  . GERD (gastroesophageal reflux disease)   . Hypertension   .  Incidental pulmonary nodule 06/22/2014   Seen on CT angiogram of chest  . NICM (nonischemic cardiomyopathy) with EF 20-25% 06/26/2013  . Obesity   . Penetrating atherosclerotic ulcer of aorta ([place]) 10/22/2013   Small penetrating ulcer of descending thoracic aorta discovered on routine CTA  . Prostate cancer ([place])    watchful waiting, no treatment so far  . S/P Maze operation for atrial fibrillation 10/29/2013   Complete bilateral atrial lesion set using cryothermy and bipolar radiofrequency ablation with clipping of LA appendage via right mini thoracotomy  . Shortness of breath    seen by Stanford Breed, & low energy     Past Surgical History:    Procedure Laterality Date  . CARDIAC CATHETERIZATION  06/2013  . CARDIOVERSION N/A 06/30/2013   Procedure: CARDIOVERSION;  Surgeon: [name], MD;  Location: [place];  Service: Cardiovascular;  Laterality: N/A;  . CARDIOVERSION N/A 07/31/2013   Procedure: CARDIOVERSION;  Surgeon: Thayer Headings, MD;  Location: [place];  Service: Cardiovascular;  Laterality: N/A;  . CARDIOVERSION N/A 03/19/2018   Procedure: CARDIOVERSION;  Surgeon: [name], MD;  Location: [facility] ENDOSCOPY;  Service: Cardiovascular;  Laterality: N/A;  . CLIPPING OF ATRIAL APPENDAGE  10/29/2013   Procedure: CLIPPING OF ATRIAL APPENDAGE;  Surgeon: Rexene Alberts, MD;  Location: [place];  Service: Open Heart Surgery;;  . INTRAOPERATIVE TRANSESOPHAGEAL ECHOCARDIOGRAM N/A 10/29/2013   Procedure: INTRAOPERATIVE TRANSESOPHAGEAL ECHOCARDIOGRAM;  Surgeon: Rexene Alberts, MD;  Location: [place];  Service: Open Heart Surgery;  Laterality: N/A;  . LEFT AND RIGHT HEART CATHETERIZATION WITH CORONARY ANGIOGRAM N/A 06/27/2013   Procedure: LEFT AND RIGHT HEART CATHETERIZATION WITH CORONARY ANGIOGRAM;  Surgeon: Jettie Booze, MD;  Location: [facility] CATH LAB;  Service: Cardiovascular;  Laterality: N/A;  . MEDIAL PARTIAL KNEE REPLACEMENT Left 1990's?  Marland Kitchen MINIMALLY INVASIVE MAZE PROCEDURE N/A 10/29/2013   Procedure: MINIMALLY INVASIVE MAZE PROCEDURE;  Surgeon: Rexene Alberts, MD;  Location: [place];  Service: Open Heart Surgery;  Laterality: N/A;  . TEE WITHOUT CARDIOVERSION N/A 06/30/2013   Procedure: TRANSESOPHAGEAL ECHOCARDIOGRAM (TEE) ;  Surgeon: [name], MD;  Location: [place];  Service: Cardiovascular;  Laterality: N/A;  . TRANSURETHRAL RESECTION OF PROSTATE  2010    Social History   Socioeconomic History  . Marital status: Widowed    Spouse name: Not on file  . Number of children: Not on file  . Years of education: Not on file  . Highest education level: Not on file  Occupational History  . Not on file  Social Needs   . Financial resource strain: Not on file  . Food insecurity:    Worry: Not on file    Inability: Not on file  . Transportation needs:    Medical: Not on file    Non-medical: Not on file  Tobacco Use  . Smoking status: Never Smoker  . Smokeless tobacco: Never Used  . Tobacco comment: 06/25/2013 "smoked very light; years and years ago"  Substance and Sexual Activity  . Alcohol use: Yes    Alcohol/week: 14.0 standard drinks    Types: 7 Glasses of wine, 7 Shots of liquor per week  . Drug use: No  . Sexual activity: Not Currently  Lifestyle  . Physical activity:    Days per week: Not on file    Minutes per session: Not on file  . Stress: Not on file  Relationships  . Social connections:    Talks on phone: Not on file    Gets  together: Not on file    Attends religious service: Not on file    Active member of club or organization: Not on file    Attends meetings of clubs or organizations: Not on file    Relationship status: Not on file  . Intimate partner violence:    Fear of current or ex partner: Not on file    Emotionally abused: Not on file    Physically abused: Not on file    Forced sexual activity: Not on file  Other Topics Concern  . Not on file  Social History Narrative   He lives in [place]. He is widowed. Wife passed of breast cancer last year. No children.     Family History  Problem Relation Age of Onset  . Healthy Father   . Healthy Mother   . Healthy Sister     ROS: no fevers or chills, productive cough, hemoptysis, dysphasia, odynophagia, melena, hematochezia, dysuria, hematuria, rash, seizure activity, orthopnea, PND, pedal edema, claudication. Remaining systems are negative.  Physical Exam: Well-developed well-nourished in no acute distress.  Skin is warm and dry.  HEENT is normal.  Neck is supple.  Chest is clear to auscultation with normal expansion.  Cardiovascular exam is regular rate and rhythm.  Abdominal exam nontender or distended. No  masses palpated. Extremities show no edema. neuro grossly intact  ECG-sinus rhythm at a rate of 72, low voltage.  Personally reviewed  A/P  1 paroxysmal atrial fibrillation/flutter-patient is now in sinus rhythm status post cardioversion.  Plan to continue metoprolol at present dose for rate control if atrial fibrillation recurs.  Continue apixaban at present dose.  We will consider addition of an antiarrhythmic in the future if atrial arrhythmias recur and he is symptomatic.  We will obtain his most recent hemoglobin, BUN and creatinine from the [place] where he has his blood work done.  2 chronic diastolic congestive heart failure-patient appears to be euvolemic on examination today.  Continue present dose of diuretic.  3 hypertension-patient's blood pressure is elevated; he would like to follow this at home and we can increase amlodipine if it remains elevated.  4 hyperlipidemia-continue statin.  5 coronary artery disease-he has not had chest pain.  Continue medical therapy with statin.  No aspirin given need for apixaban.  6 history of nonischemic cardiomyopathy-LV function improved on most recent echocardiogram.  Continue ACE inhibitor and beta-blocker.  7 history of penetrating atherosclerotic aortic ulcer-previously evaluated by Dr. Roxy Manns and follow-up imaging not recommended.  Kirk Ruths, MD

## 2018-04-24 ENCOUNTER — Encounter: Payer: Self-pay | Admitting: Cardiology

## 2018-04-24 ENCOUNTER — Ambulatory Visit (INDEPENDENT_AMBULATORY_CARE_PROVIDER_SITE_OTHER): Payer: Medicare Other | Admitting: Cardiology

## 2018-04-24 VITALS — BP 158/90 | HR 72 | Ht 72.0 in | Wt 263.0 lb

## 2018-04-24 DIAGNOSIS — I480 Paroxysmal atrial fibrillation: Secondary | ICD-10-CM | POA: Diagnosis not present

## 2018-04-24 DIAGNOSIS — E7800 Pure hypercholesterolemia, unspecified: Secondary | ICD-10-CM | POA: Diagnosis not present

## 2018-04-24 DIAGNOSIS — I2510 Atherosclerotic heart disease of native coronary artery without angina pectoris: Secondary | ICD-10-CM | POA: Diagnosis not present

## 2018-04-24 DIAGNOSIS — I10 Essential (primary) hypertension: Secondary | ICD-10-CM

## 2018-04-24 NOTE — Patient Instructions (Signed)
Your physician wants you to follow-up in: Desha will receive a reminder letter in the mail two months in advance. If you don't receive a letter, please call our office to schedule the follow-up appointment.  CALL IN June TO SCHEDULE APPOINTMENT IN August

## 2018-06-26 ENCOUNTER — Ambulatory Visit (INDEPENDENT_AMBULATORY_CARE_PROVIDER_SITE_OTHER): Payer: Medicare Other | Admitting: Podiatry

## 2018-06-26 ENCOUNTER — Other Ambulatory Visit: Payer: Self-pay

## 2018-06-26 ENCOUNTER — Encounter: Payer: Self-pay | Admitting: Podiatry

## 2018-06-26 DIAGNOSIS — B351 Tinea unguium: Secondary | ICD-10-CM | POA: Diagnosis not present

## 2018-06-26 DIAGNOSIS — D689 Coagulation defect, unspecified: Secondary | ICD-10-CM

## 2018-06-26 DIAGNOSIS — M79676 Pain in unspecified toe(s): Secondary | ICD-10-CM | POA: Diagnosis not present

## 2018-06-26 NOTE — Progress Notes (Signed)
Patient ID: Jose Snyder, male   DOB: [birth year], 78 y.o.   MRN: [id number] Complaint:  Visit Type: Patient returns to my office for continued preventative foot care services. Complaint: Patient states" my nails have grown long and thick and become painful to walk and wear shoes. The patient presents for preventative foot care services.  Patient is on eliquiss.  Podiatric Exam: Vascular: dorsalis pedis and posterior tibial pulses are palpable bilateral. Capillary return is immediate. Temperature gradient is WNL. Skin turgor WNL  Sensorium: Normal Semmes Weinstein monofilament test. Normal tactile sensation bilaterally. Nail Exam: Pt has thick disfigured discolored nails with subungual debris noted bilateral entire nail hallux through fifth toenails Ulcer Exam: There is no evidence of ulcer or pre-ulcerative changes or infection. Orthopedic Exam: Muscle tone and strength are WNL. No limitations in general ROM. No crepitus or effusions noted. Foot type and digits show no abnormalities. Bony prominences are unremarkable. Skin: No Porokeratosis. No infection or ulcers  Diagnosis:  Onychomycosis, , Pain in right toe, pain in left toes  Treatment & Plan Procedures and Treatment: Consent by patient was obtained for treatment procedures. The patient understood the discussion of treatment and procedures well. All questions were answered thoroughly reviewed. Debridement of mycotic and hypertrophic toenails, 1 through 5 bilateral and clearing of subungual debris. No ulceration, no infection noted.  Return Visit-Office Procedure: Patient instructed to return to the office for a follow up visit 3 months for continued evaluation and treatment.  [name] DPM

## 2018-07-17 NOTE — Progress Notes (Signed)
HPI: FU atrial fibrillation. Echo 4/15 EF 20-25%. LHC demonstrated mod non-obs CAD. Most significant lesion was a mid D1 with 75% stenosis. DCM was felt to be out of proportion to CAD (NICM). Had DCCV on amiodarone but did not hold sinus. He was seen by Dr. Rayann Heman and referred to Dr. Roxy Manns for surgical maze. Carotid Dopplers August 2015 showed 1-39% bilateral stenosis. Patient underwent maze procedure with clipping of left atrial appendage on October 29, 2013.CTA April 2018 showed focal saccular outpouching of the distal descending thoracic aorta with no intramural hematoma or inflammatory changes. Findings felt possibly early ulcer formation and not a penetrating ulcer per se. Images were reviewed by Dr. Roxy Manns and no further follow-up recommended.Echocardiogram October 2019 showed normal LV function, moderate left ventricular hypertrophy,mildly dilated ascending aorta and mild mitral regurgitation.   Patient found to be in atrial flutter at last office visit.  Had successful cardioversion on March 19, 2018.    Recently contacted the office and felt he was in recurrent atrial fibrillation and was added to my schedule today.  Since last seen,over the past 2 weeks patient has noticed his heart rate at about 130.  He also has had increased dyspnea on exertion but no orthopnea or PND.  Occasional mild pedal edema.  He has not had chest pain, palpitations or syncope.  He has been compliant with apixaban.  Current Outpatient Medications  Medication Sig Dispense Refill  . amLODipine (NORVASC) 5 MG tablet Take 1 tablet (5 mg total) by mouth daily. 90 tablet 3  . apixaban (ELIQUIS) 5 MG TABS tablet Take 1 tablet (5 mg total) by mouth 2 (two) times daily. 60 tablet 11  . atorvastatin (LIPITOR) 80 MG tablet Take 1 tablet (80 mg total) by mouth daily with breakfast. 30 tablet 12  . docusate sodium (COLACE) 100 MG capsule Take 100 mg by mouth daily as needed for mild constipation.    . furosemide  (LASIX) 40 MG tablet Take 1 tablet (40 mg total) by mouth 2 (two) times daily. If swelling takes an extra dose 60 tablet 6  . ibuprofen (ADVIL,MOTRIN) 200 MG tablet Take 400 mg by mouth every 6 (six) hours as needed for headache or moderate pain.     Marland Kitchen lisinopril (PRINIVIL,ZESTRIL) 40 MG tablet Take 40 mg by mouth daily.    . metoprolol (TOPROL-XL) 200 MG 24 hr tablet Take 100 mg by mouth daily.    . metoprolol succinate (TOPROL XL) 25 MG 24 hr tablet Take 1 tablet (25 mg total) by mouth daily. 90 tablet 3  . omeprazole (PRILOSEC) 20 MG capsule Take 20 mg by mouth daily.    . potassium chloride SA (K-DUR,KLOR-CON) 20 MEQ tablet Take 1 tablet (20 mEq total) by mouth daily. 30 tablet 11  . temazepam (RESTORIL) 15 MG capsule Take 15 mg by mouth at bedtime as needed for sleep.      No current facility-administered medications for this visit.      Past Medical History:  Diagnosis Date  . Anxiety   . Arthritis    R hand- OA  . Atrial Fibrillation  06/25/2013  . CAD (coronary artery disease), native coronary artery 06/28/2013   a. LHC (06/27/13):  Mid LAD 40%, mid D1 75%, mid CFX 50-60%, mid RCA 50%, EF 20%, LVEDP 24 mmHg.  . CHF (congestive heart failure) ([place])   . Chronic combined systolic and diastolic heart failure ([place]) 06/26/2013   a.  Echo (06/25/13):  EF 20-25%, diff  HK, restrictive physio, mild MR, mod to severe LAE, mild RVE, mildly reduced RVSF, mod to severe RAE  . Chronic kidney disease   . Dyslipidemia 07/15/2013  . Dysrhythmia    a-fib  . GERD (gastroesophageal reflux disease)   . Hypertension   . Incidental pulmonary nodule 06/22/2014   Seen on CT angiogram of chest  . NICM (nonischemic cardiomyopathy) with EF 20-25% 06/26/2013  . Obesity   . Penetrating atherosclerotic ulcer of aorta ([place]) 10/22/2013   Small penetrating ulcer of descending thoracic aorta discovered on routine CTA  . Prostate cancer ([place])    watchful waiting, no treatment so far  . S/P Maze operation for atrial  fibrillation 10/29/2013   Complete bilateral atrial lesion set using cryothermy and bipolar radiofrequency ablation with clipping of LA appendage via right mini thoracotomy  . Shortness of breath    seen by Stanford Breed, & low energy     Past Surgical History:  Procedure Laterality Date  . CARDIAC CATHETERIZATION  06/2013  . CARDIOVERSION N/A 06/30/2013   Procedure: CARDIOVERSION;  Surgeon: [name], MD;  Location: [place];  Service: Cardiovascular;  Laterality: N/A;  . CARDIOVERSION N/A 07/31/2013   Procedure: CARDIOVERSION;  Surgeon: Thayer Headings, MD;  Location: [place];  Service: Cardiovascular;  Laterality: N/A;  . CARDIOVERSION N/A 03/19/2018   Procedure: CARDIOVERSION;  Surgeon: [name], MD;  Location: [facility] ENDOSCOPY;  Service: Cardiovascular;  Laterality: N/A;  . CLIPPING OF ATRIAL APPENDAGE  10/29/2013   Procedure: CLIPPING OF ATRIAL APPENDAGE;  Surgeon: Rexene Alberts, MD;  Location: [place];  Service: Open Heart Surgery;;  . INTRAOPERATIVE TRANSESOPHAGEAL ECHOCARDIOGRAM N/A 10/29/2013   Procedure: INTRAOPERATIVE TRANSESOPHAGEAL ECHOCARDIOGRAM;  Surgeon: Rexene Alberts, MD;  Location: [place];  Service: Open Heart Surgery;  Laterality: N/A;  . LEFT AND RIGHT HEART CATHETERIZATION WITH CORONARY ANGIOGRAM N/A 06/27/2013   Procedure: LEFT AND RIGHT HEART CATHETERIZATION WITH CORONARY ANGIOGRAM;  Surgeon: Jettie Booze, MD;  Location: [facility] CATH LAB;  Service: Cardiovascular;  Laterality: N/A;  . MEDIAL PARTIAL KNEE REPLACEMENT Left 1990's?  Marland Kitchen MINIMALLY INVASIVE MAZE PROCEDURE N/A 10/29/2013   Procedure: MINIMALLY INVASIVE MAZE PROCEDURE;  Surgeon: Rexene Alberts, MD;  Location: [place];  Service: Open Heart Surgery;  Laterality: N/A;  . TEE WITHOUT CARDIOVERSION N/A 06/30/2013   Procedure: TRANSESOPHAGEAL ECHOCARDIOGRAM (TEE) ;  Surgeon: [name], MD;  Location: [place];  Service: Cardiovascular;  Laterality: N/A;  . TRANSURETHRAL RESECTION OF PROSTATE  2010    Social  History   Socioeconomic History  . Marital status: Widowed    Spouse name: Not on file  . Number of children: Not on file  . Years of education: Not on file  . Highest education level: Not on file  Occupational History  . Not on file  Social Needs  . Financial resource strain: Not on file  . Food insecurity:    Worry: Not on file    Inability: Not on file  . Transportation needs:    Medical: Not on file    Non-medical: Not on file  Tobacco Use  . Smoking status: Never Smoker  . Smokeless tobacco: Never Used  . Tobacco comment: 06/25/2013 "smoked very light; years and years ago"  Substance and Sexual Activity  . Alcohol use: Yes    Alcohol/week: 14.0 standard drinks    Types: 7 Glasses of wine, 7 Shots of liquor per week  . Drug use: No  . Sexual activity: Not Currently  Lifestyle  .  Physical activity:    Days per week: Not on file    Minutes per session: Not on file  . Stress: Not on file  Relationships  . Social connections:    Talks on phone: Not on file    Gets together: Not on file    Attends religious service: Not on file    Active member of club or organization: Not on file    Attends meetings of clubs or organizations: Not on file    Relationship status: Not on file  . Intimate partner violence:    Fear of current or ex partner: Not on file    Emotionally abused: Not on file    Physically abused: Not on file    Forced sexual activity: Not on file  Other Topics Concern  . Not on file  Social History Narrative   He lives in [place]. He is widowed. Wife passed of breast cancer last year. No children.     Family History  Problem Relation Age of Onset  . Healthy Father   . Healthy Mother   . Healthy Sister     ROS: no fevers or chills, productive cough, hemoptysis, dysphasia, odynophagia, melena, hematochezia, dysuria, hematuria, rash, seizure activity, orthopnea, PND, pedal edema, claudication. Remaining systems are negative.  Physical Exam:  Well-developed well-nourished in no acute distress.  Skin is warm and dry.  HEENT is normal.  Neck is supple.  Chest is clear to auscultation with normal expansion.  Cardiovascular exam is regular rate and rhythm.  Abdominal exam nontender or distended. No masses palpated. Extremities show no edema. neuro grossly intact  ECG-atrial flutter at a rate of 130, right axis deviation, low voltage personally reviewed  A/P  1 paroxysmal atrial fibrillation-patient has developed recurrent atypical atrial flutter.  As outlined previously he has had previous Maze procedure.  He also previously had a tachycardia mediated cardiomyopathy.  Therefore we will arrange cardioversion as his heart rate is approximately 130.  He has been compliant with apixaban.  Given the close proximity to recent episode he will need an antiarrhythmic to maintain sinus rhythm.  Add amiodarone 200 mg twice daily for 2 weeks and then 200 mg daily thereafter.  2 Chronic diastolic congestive heart failure-patient appears to be euvolemic.  Continue present dose of diuretic.  3 hypertension-patient's blood pressure is elevated.  However he states typically controlled.  Follow blood pressure and adjust regimen as needed.  4 coronary artery disease-plan to continue medical therapy.  He is not on aspirin given need for anticoagulation.  Continue statin.  5 hyperlipidemia-continue statin.  6 history of nonischemic cardiomyopathy-LV function has improved on most recent study.  Plan to continue ACE inhibitor and beta-blocker.  7 history of penetrating atherosclerotic aortic ulcer-patient has previously been evaluated by Dr. Roxy Manns.  Follow-up imaging was not recommended.  Kirk Ruths, MD

## 2018-07-17 NOTE — H&P (View-Only) (Signed)
HPI: FU atrial fibrillation. Echo 4/15 EF 20-25%. LHC demonstrated mod non-obs CAD. Most significant lesion was a mid D1 with 75% stenosis. DCM was felt to be out of proportion to CAD (NICM). Had DCCV on amiodarone but did not hold sinus. He was seen by Dr. Rayann Heman and referred to Dr. Roxy Manns for surgical maze. Carotid Dopplers August 2015 showed 1-39% bilateral stenosis. Patient underwent maze procedure with clipping of left atrial appendage on October 29, 2013.CTA April 2018 showed focal saccular outpouching of the distal descending thoracic aorta with no intramural hematoma or inflammatory changes. Findings felt possibly early ulcer formation and not a penetrating ulcer per se. Images were reviewed by Dr. Roxy Manns and no further follow-up recommended.Echocardiogram October 2019 showed normal LV function, moderate left ventricular hypertrophy,mildly dilated ascending aorta and mild mitral regurgitation.   Patient found to be in atrial flutter at last office visit.  Had successful cardioversion on March 19, 2018.    Recently contacted the office and felt he was in recurrent atrial fibrillation and was added to my schedule today.  Since last seen,over the past 2 weeks patient has noticed his heart rate at about 130.  He also has had increased dyspnea on exertion but no orthopnea or PND.  Occasional mild pedal edema.  He has not had chest pain, palpitations or syncope.  He has been compliant with apixaban.  Current Outpatient Medications  Medication Sig Dispense Refill  . amLODipine (NORVASC) 5 MG tablet Take 1 tablet (5 mg total) by mouth daily. 90 tablet 3  . apixaban (ELIQUIS) 5 MG TABS tablet Take 1 tablet (5 mg total) by mouth 2 (two) times daily. 60 tablet 11  . atorvastatin (LIPITOR) 80 MG tablet Take 1 tablet (80 mg total) by mouth daily with breakfast. 30 tablet 12  . docusate sodium (COLACE) 100 MG capsule Take 100 mg by mouth daily as needed for mild constipation.    . furosemide  (LASIX) 40 MG tablet Take 1 tablet (40 mg total) by mouth 2 (two) times daily. If swelling takes an extra dose 60 tablet 6  . ibuprofen (ADVIL,MOTRIN) 200 MG tablet Take 400 mg by mouth every 6 (six) hours as needed for headache or moderate pain.     Marland Kitchen lisinopril (PRINIVIL,ZESTRIL) 40 MG tablet Take 40 mg by mouth daily.    . metoprolol (TOPROL-XL) 200 MG 24 hr tablet Take 100 mg by mouth daily.    . metoprolol succinate (TOPROL XL) 25 MG 24 hr tablet Take 1 tablet (25 mg total) by mouth daily. 90 tablet 3  . omeprazole (PRILOSEC) 20 MG capsule Take 20 mg by mouth daily.    . potassium chloride SA (K-DUR,KLOR-CON) 20 MEQ tablet Take 1 tablet (20 mEq total) by mouth daily. 30 tablet 11  . temazepam (RESTORIL) 15 MG capsule Take 15 mg by mouth at bedtime as needed for sleep.      No current facility-administered medications for this visit.      Past Medical History:  Diagnosis Date  . Anxiety   . Arthritis    R hand- OA  . Atrial Fibrillation  06/25/2013  . CAD (coronary artery disease), native coronary artery 06/28/2013   a. LHC (06/27/13):  Mid LAD 40%, mid D1 75%, mid CFX 50-60%, mid RCA 50%, EF 20%, LVEDP 24 mmHg.  . CHF (congestive heart failure) ([place])   . Chronic combined systolic and diastolic heart failure ([place]) 06/26/2013   a.  Echo (06/25/13):  EF 20-25%, diff  HK, restrictive physio, mild MR, mod to severe LAE, mild RVE, mildly reduced RVSF, mod to severe RAE  . Chronic kidney disease   . Dyslipidemia 07/15/2013  . Dysrhythmia    a-fib  . GERD (gastroesophageal reflux disease)   . Hypertension   . Incidental pulmonary nodule 06/22/2014   Seen on CT angiogram of chest  . NICM (nonischemic cardiomyopathy) with EF 20-25% 06/26/2013  . Obesity   . Penetrating atherosclerotic ulcer of aorta ([place]) 10/22/2013   Small penetrating ulcer of descending thoracic aorta discovered on routine CTA  . Prostate cancer ([place])    watchful waiting, no treatment so far  . S/P Maze operation for atrial  fibrillation 10/29/2013   Complete bilateral atrial lesion set using cryothermy and bipolar radiofrequency ablation with clipping of LA appendage via right mini thoracotomy  . Shortness of breath    seen by Stanford Breed, & low energy     Past Surgical History:  Procedure Laterality Date  . CARDIAC CATHETERIZATION  06/2013  . CARDIOVERSION N/A 06/30/2013   Procedure: CARDIOVERSION;  Surgeon: [name], MD;  Location: [facility];  Service: Cardiovascular;  Laterality: N/A;  . CARDIOVERSION N/A 07/31/2013   Procedure: CARDIOVERSION;  Surgeon: Thayer Headings, MD;  Location: [facility];  Service: Cardiovascular;  Laterality: N/A;  . CARDIOVERSION N/A 03/19/2018   Procedure: CARDIOVERSION;  Surgeon: [name], MD;  Location: [facility] ENDOSCOPY;  Service: Cardiovascular;  Laterality: N/A;  . CLIPPING OF ATRIAL APPENDAGE  10/29/2013   Procedure: CLIPPING OF ATRIAL APPENDAGE;  Surgeon: Rexene Alberts, MD;  Location: [place];  Service: Open Heart Surgery;;  . INTRAOPERATIVE TRANSESOPHAGEAL ECHOCARDIOGRAM N/A 10/29/2013   Procedure: INTRAOPERATIVE TRANSESOPHAGEAL ECHOCARDIOGRAM;  Surgeon: Rexene Alberts, MD;  Location: [place];  Service: Open Heart Surgery;  Laterality: N/A;  . LEFT AND RIGHT HEART CATHETERIZATION WITH CORONARY ANGIOGRAM N/A 06/27/2013   Procedure: LEFT AND RIGHT HEART CATHETERIZATION WITH CORONARY ANGIOGRAM;  Surgeon: Jettie Booze, MD;  Location: [facility] CATH LAB;  Service: Cardiovascular;  Laterality: N/A;  . MEDIAL PARTIAL KNEE REPLACEMENT Left 1990's?  Marland Kitchen MINIMALLY INVASIVE MAZE PROCEDURE N/A 10/29/2013   Procedure: MINIMALLY INVASIVE MAZE PROCEDURE;  Surgeon: Rexene Alberts, MD;  Location: [place];  Service: Open Heart Surgery;  Laterality: N/A;  . TEE WITHOUT CARDIOVERSION N/A 06/30/2013   Procedure: TRANSESOPHAGEAL ECHOCARDIOGRAM (TEE) ;  Surgeon: [name], MD;  Location: [place];  Service: Cardiovascular;  Laterality: N/A;  . TRANSURETHRAL RESECTION OF PROSTATE  2010    Social  History   Socioeconomic History  . Marital status: Widowed    Spouse name: Not on file  . Number of children: Not on file  . Years of education: Not on file  . Highest education level: Not on file  Occupational History  . Not on file  Social Needs  . Financial resource strain: Not on file  . Food insecurity:    Worry: Not on file    Inability: Not on file  . Transportation needs:    Medical: Not on file    Non-medical: Not on file  Tobacco Use  . Smoking status: Never Smoker  . Smokeless tobacco: Never Used  . Tobacco comment: 06/25/2013 "smoked very light; years and years ago"  Substance and Sexual Activity  . Alcohol use: Yes    Alcohol/week: 14.0 standard drinks    Types: 7 Glasses of wine, 7 Shots of liquor per week  . Drug use: No  . Sexual activity: Not Currently  Lifestyle  .  Physical activity:    Days per week: Not on file    Minutes per session: Not on file  . Stress: Not on file  Relationships  . Social connections:    Talks on phone: Not on file    Gets together: Not on file    Attends religious service: Not on file    Active member of club or organization: Not on file    Attends meetings of clubs or organizations: Not on file    Relationship status: Not on file  . Intimate partner violence:    Fear of current or ex partner: Not on file    Emotionally abused: Not on file    Physically abused: Not on file    Forced sexual activity: Not on file  Other Topics Concern  . Not on file  Social History Narrative   He lives in [place]. He is widowed. Wife passed of breast cancer last year. No children.     Family History  Problem Relation Age of Onset  . Healthy Father   . Healthy Mother   . Healthy Sister     ROS: no fevers or chills, productive cough, hemoptysis, dysphasia, odynophagia, melena, hematochezia, dysuria, hematuria, rash, seizure activity, orthopnea, PND, pedal edema, claudication. Remaining systems are negative.  Physical Exam:  Well-developed well-nourished in no acute distress.  Skin is warm and dry.  HEENT is normal.  Neck is supple.  Chest is clear to auscultation with normal expansion.  Cardiovascular exam is regular rate and rhythm.  Abdominal exam nontender or distended. No masses palpated. Extremities show no edema. neuro grossly intact  ECG-atrial flutter at a rate of 130, right axis deviation, low voltage personally reviewed  A/P  1 paroxysmal atrial fibrillation-patient has developed recurrent atypical atrial flutter.  As outlined previously he has had previous Maze procedure.  He also previously had a tachycardia mediated cardiomyopathy.  Therefore we will arrange cardioversion as his heart rate is approximately 130.  He has been compliant with apixaban.  Given the close proximity to recent episode he will need an antiarrhythmic to maintain sinus rhythm.  Add amiodarone 200 mg twice daily for 2 weeks and then 200 mg daily thereafter.  2 Chronic diastolic congestive heart failure-patient appears to be euvolemic.  Continue present dose of diuretic.  3 hypertension-patient's blood pressure is elevated.  However he states typically controlled.  Follow blood pressure and adjust regimen as needed.  4 coronary artery disease-plan to continue medical therapy.  He is not on aspirin given need for anticoagulation.  Continue statin.  5 hyperlipidemia-continue statin.  6 history of nonischemic cardiomyopathy-LV function has improved on most recent study.  Plan to continue ACE inhibitor and beta-blocker.  7 history of penetrating atherosclerotic aortic ulcer-patient has previously been evaluated by Dr. Roxy Manns.  Follow-up imaging was not recommended.  Kirk Ruths, MD

## 2018-07-18 ENCOUNTER — Encounter: Payer: Self-pay | Admitting: Cardiology

## 2018-07-18 ENCOUNTER — Ambulatory Visit (INDEPENDENT_AMBULATORY_CARE_PROVIDER_SITE_OTHER): Payer: Medicare Other | Admitting: Cardiology

## 2018-07-18 ENCOUNTER — Other Ambulatory Visit: Payer: Self-pay

## 2018-07-18 ENCOUNTER — Other Ambulatory Visit: Payer: Self-pay | Admitting: *Deleted

## 2018-07-18 VITALS — BP 162/102 | HR 130 | Temp 97.1°F | Ht 74.0 in | Wt 262.0 lb

## 2018-07-18 DIAGNOSIS — I2510 Atherosclerotic heart disease of native coronary artery without angina pectoris: Secondary | ICD-10-CM

## 2018-07-18 DIAGNOSIS — I10 Essential (primary) hypertension: Secondary | ICD-10-CM | POA: Diagnosis not present

## 2018-07-18 DIAGNOSIS — I482 Chronic atrial fibrillation, unspecified: Secondary | ICD-10-CM

## 2018-07-18 DIAGNOSIS — I5042 Chronic combined systolic (congestive) and diastolic (congestive) heart failure: Secondary | ICD-10-CM

## 2018-07-18 DIAGNOSIS — I428 Other cardiomyopathies: Secondary | ICD-10-CM

## 2018-07-18 DIAGNOSIS — I4891 Unspecified atrial fibrillation: Secondary | ICD-10-CM

## 2018-07-18 DIAGNOSIS — I480 Paroxysmal atrial fibrillation: Secondary | ICD-10-CM | POA: Diagnosis not present

## 2018-07-18 MED ORDER — SODIUM CHLORIDE 0.9% FLUSH: 3.0000 mL | Freq: Two times a day (BID) | INTRAVENOUS | Status: AC

## 2018-07-18 MED ORDER — AMIODARONE HCL 200 MG PO TABS: 200.0000 mg | ORAL_TABLET | Freq: Two times a day (BID) | ORAL | 0 refills | Status: DC

## 2018-07-18 MED ORDER — AMIODARONE HCL 200 MG PO TABS: 200.0000 mg | ORAL_TABLET | Freq: Every day | ORAL | 3 refills | Status: DC

## 2018-07-18 NOTE — Patient Instructions (Addendum)
Medication Instructions:  START AMIODARONE 200 MG ONE TABLET TWICE DAILY FOR 2 WEEKS THEN DECREASE TO ONE 200 MG TABLET ONCE DAILY If you need a refill on your cardiac medications before your next appointment, please call your pharmacy.   Lab work: If you have labs (blood work) drawn today and your tests are completely normal, you will receive your results only by: Marland Kitchen MyChart Message (if you have MyChart) OR . A paper copy in the mail If you have any lab test that is abnormal or we need to change your treatment, we will call you to review the results.  Testing/Procedures: Your physician has recommended that you have a Cardioversion (DCCV). Electrical Cardioversion uses a jolt of electricity to your heart either through paddles or wired patches attached to your chest. This is a controlled, usually prescheduled, procedure. Defibrillation is done under light anesthesia in the hospital, and you usually go home the day of the procedure. This is done to get your heart back into a normal rhythm. You are not awake for the procedure. Please see the instruction sheet given to you today.    Follow-Up: At [facility], you and your health needs are our priority.  As part of our continuing mission to provide you with exceptional heart care, we have created designated Provider Care Teams.  These Care Teams include your primary Cardiologist (physician) and Advanced Practice Providers (APPs -  Physician Assistants and Nurse Practitioners) who all work together to provide you with the care you need, when you need it. Your physician recommends that you schedule a follow-up appointment in: [place] APP  Your physician recommends that you schedule a follow-up appointment in: [place] are scheduled for a Cardioversion on MAY 27 with Dr. Radford Pax.  Please arrive at the [facility] (Main Entrance A) at [facility]: [address] [place], [place] [address] at 8:45 am. (1  hour prior to procedure unless lab work is needed; if lab work is needed arrive 1.5 hours ahead)  DIET: Nothing to eat or drink after midnight except a sip of water with medications (see medication instructions below)  Medication Instructions: DO NOT TAKE METOPROLOL THE MORNING OF THE PROCEDURE  Continue your anticoagulant: ELIQUIS You will need to continue your anticoagulant after your procedure until you  are told by your  Provider that it is safe to stop   Labs: [place] Friday 07-19-2018 @ 10 AM [place] must have a responsible person to drive you home and stay in the waiting area during your procedure. Failure to do so could result in cancellation.  Bring your insurance cards.  *Special Note: Every effort is made to have your procedure done on time. Occasionally there are emergencies that occur at the hospital that may cause delays. Please be patient if a delay does occur.

## 2018-07-19 ENCOUNTER — Other Ambulatory Visit (HOSPITAL_COMMUNITY)
Admission: RE | Admit: 2018-07-19 | Discharge: 2018-07-19 | Disposition: A | Discharge status: Home / Self Care | Payer: Medicare Other | Source: Ambulatory Visit | Attending: Cardiology | Admitting: Cardiology

## 2018-07-19 DIAGNOSIS — Z1159 Encounter for screening for other viral diseases: Secondary | ICD-10-CM | POA: Diagnosis not present

## 2018-07-20 LAB — NOVEL CORONAVIRUS, NAA (HOSP ORDER, SEND-OUT TO REF LAB; TAT 18-24 HRS): SARS-CoV-2, NAA: NOT DETECTED

## 2018-07-23 NOTE — Progress Notes (Signed)
Attempted to reach patient to verify quarantine.  Unable to leave message on voicemail.

## 2018-07-24 ENCOUNTER — Ambulatory Visit (HOSPITAL_COMMUNITY)
Admission: RE | Admit: 2018-07-24 | Discharge: 2018-07-24 | Disposition: A | Discharge status: Home / Self Care | Payer: Medicare Other | Attending: Cardiology | Admitting: Cardiology

## 2018-07-24 ENCOUNTER — Other Ambulatory Visit: Payer: Self-pay

## 2018-07-24 ENCOUNTER — Encounter (HOSPITAL_COMMUNITY)
Admission: RE | Disposition: A | Discharge status: Home / Self Care | Payer: Self-pay | Source: Home / Self Care | Attending: Cardiology

## 2018-07-24 ENCOUNTER — Ambulatory Visit (HOSPITAL_COMMUNITY): Payer: Medicare Other | Admitting: Certified Registered"

## 2018-07-24 DIAGNOSIS — I480 Paroxysmal atrial fibrillation: Secondary | ICD-10-CM | POA: Diagnosis not present

## 2018-07-24 DIAGNOSIS — Z7901 Long term (current) use of anticoagulants: Secondary | ICD-10-CM | POA: Insufficient documentation

## 2018-07-24 DIAGNOSIS — I4891 Unspecified atrial fibrillation: Secondary | ICD-10-CM | POA: Diagnosis not present

## 2018-07-24 DIAGNOSIS — E785 Hyperlipidemia, unspecified: Secondary | ICD-10-CM | POA: Diagnosis not present

## 2018-07-24 DIAGNOSIS — I428 Other cardiomyopathies: Secondary | ICD-10-CM | POA: Diagnosis not present

## 2018-07-24 DIAGNOSIS — M19041 Primary osteoarthritis, right hand: Secondary | ICD-10-CM | POA: Insufficient documentation

## 2018-07-24 DIAGNOSIS — I5042 Chronic combined systolic (congestive) and diastolic (congestive) heart failure: Secondary | ICD-10-CM | POA: Diagnosis not present

## 2018-07-24 DIAGNOSIS — I110 Hypertensive heart disease with heart failure: Secondary | ICD-10-CM | POA: Diagnosis not present

## 2018-07-24 DIAGNOSIS — E669 Obesity, unspecified: Secondary | ICD-10-CM | POA: Insufficient documentation

## 2018-07-24 DIAGNOSIS — I2510 Atherosclerotic heart disease of native coronary artery without angina pectoris: Secondary | ICD-10-CM | POA: Diagnosis not present

## 2018-07-24 DIAGNOSIS — K219 Gastro-esophageal reflux disease without esophagitis: Secondary | ICD-10-CM | POA: Insufficient documentation

## 2018-07-24 DIAGNOSIS — Z79899 Other long term (current) drug therapy: Secondary | ICD-10-CM | POA: Diagnosis not present

## 2018-07-24 DIAGNOSIS — N189 Chronic kidney disease, unspecified: Secondary | ICD-10-CM | POA: Insufficient documentation

## 2018-07-24 DIAGNOSIS — I130 Hypertensive heart and chronic kidney disease with heart failure and stage 1 through stage 4 chronic kidney disease, or unspecified chronic kidney disease: Secondary | ICD-10-CM | POA: Diagnosis not present

## 2018-07-24 DIAGNOSIS — I509 Heart failure, unspecified: Secondary | ICD-10-CM | POA: Diagnosis not present

## 2018-07-24 DIAGNOSIS — M1990 Unspecified osteoarthritis, unspecified site: Secondary | ICD-10-CM | POA: Diagnosis not present

## 2018-07-24 DIAGNOSIS — Z96652 Presence of left artificial knee joint: Secondary | ICD-10-CM | POA: Insufficient documentation

## 2018-07-24 DIAGNOSIS — I484 Atypical atrial flutter: Secondary | ICD-10-CM

## 2018-07-24 HISTORY — PX: CARDIOVERSION: SHX1299

## 2018-07-24 LAB — POCT I-STAT 4, (NA,K, GLUC, HGB,HCT)
Glucose, Bld: 119 mg/dL — ABNORMAL HIGH (ref 70–99)
HCT: 39 % (ref 39.0–52.0)
Hemoglobin: 13.3 g/dL (ref 13.0–17.0)
Potassium: 4.8 mmol/L (ref 3.5–5.1)
Sodium: 138 mmol/L (ref 135–145)

## 2018-07-24 SURGERY — CARDIOVERSION
Anesthesia: General

## 2018-07-24 MED ORDER — SODIUM CHLORIDE 0.9 % IV SOLN
INTRAVENOUS | Status: DC
  Administered 2018-07-24: 09:00:00 via INTRAVENOUS

## 2018-07-24 MED ORDER — LIDOCAINE 2% (20 MG/ML) 5 ML SYRINGE
INTRAMUSCULAR | Status: DC | PRN
  Administered 2018-07-24: 60 mg via INTRAVENOUS

## 2018-07-24 MED ORDER — SODIUM CHLORIDE 0.9 % IV SOLN: 250.0000 mL | INTRAVENOUS | Status: DC

## 2018-07-24 MED ORDER — METOPROLOL SUCCINATE ER 25 MG PO TB24: 25.0000 mg | ORAL_TABLET | Freq: Every day | ORAL | Status: DC

## 2018-07-24 MED ORDER — SODIUM CHLORIDE 0.9% FLUSH: 3.0000 mL | INTRAVENOUS | Status: DC | PRN

## 2018-07-24 MED ORDER — AMLODIPINE BESYLATE 5 MG PO TABS: 5.0000 mg | ORAL_TABLET | Freq: Every day | ORAL | Status: DC

## 2018-07-24 MED ORDER — PROPOFOL 10 MG/ML IV BOLUS
INTRAVENOUS | Status: DC | PRN
  Administered 2018-07-24: 50 mg via INTRAVENOUS

## 2018-07-24 NOTE — Discharge Instructions (Signed)

## 2018-07-24 NOTE — Transfer of Care (Signed)
Immediate Anesthesia Transfer of Care Note  Patient: Jose Snyder  Procedure(s) Performed: CARDIOVERSION (N/A )  Patient Location: PACU  Anesthesia Type:General  Level of Consciousness: awake, alert , oriented and patient cooperative  Airway & Oxygen Therapy: Patient Spontanous Breathing and Patient connected to face mask oxygen  Post-op Assessment: Report given to RN and Post -op Vital signs reviewed and stable  Post vital signs: Reviewed and stable  Last Vitals:  Vitals Value Taken Time  BP 119/76 07/24/2018 10:00 AM  Temp    Pulse 63 07/24/2018 10:02 AM  Resp 22 07/24/2018 10:02 AM  SpO2 100 % 07/24/2018 10:02 AM    Last Pain:  Vitals:   07/24/18 0850  TempSrc: Oral  PainSc: 0-No pain         Complications: No apparent anesthesia complications

## 2018-07-24 NOTE — Anesthesia Postprocedure Evaluation (Signed)
Anesthesia Post Note  Patient: Jose Snyder  Procedure(s) Performed: CARDIOVERSION (N/A )     Patient location during evaluation: PACU Anesthesia Type: General Level of consciousness: awake and alert Pain management: pain level controlled Vital Signs Assessment: post-procedure vital signs reviewed and stable Respiratory status: spontaneous breathing, nonlabored ventilation and respiratory function stable Cardiovascular status: blood pressure returned to baseline and stable Postop Assessment: no apparent nausea or vomiting Anesthetic complications: no    Last Vitals:  Vitals:   07/24/18 1010 07/24/18 1015  BP: (!) 164/97 (!) 167/97  Pulse: 69 69  Resp: 18 18  Temp:    SpO2: 98% 97%    Last Pain:  Vitals:   07/24/18 1007  TempSrc: Oral  PainSc: 0-No pain                 [name],W. EDMOND

## 2018-07-24 NOTE — CV Procedure (Signed)
   Electrical Cardioversion Procedure Note Nikolaos Maddocks [id number] [birth year]  Procedure: Electrical Cardioversion Indications:  Atrial Flutter  Time Out: Verified patient identification, verified procedure,medications/allergies/relevent history reviewed, required imaging and test results available.  Performed  Procedure Details  The patient was NPO after midnight. Anesthesia was administered at the beside  by Dr.Fitzgerald with 50mg  of propofol and 60mg  Lidocaine.  Cardioversion was done with synchronized biphasic defibrillation with AP pads with 150watts.  The patient converted to sinus bradycardia. The patient tolerated the procedure well   IMPRESSION:  Successful cardioversion of atrial flutter    Traci Turner 07/24/2018, 9:47 AM

## 2018-07-24 NOTE — Interval H&P Note (Signed)
History and Physical Interval Note:  07/24/2018 9:46 AM  Jose Snyder  has presented today for surgery, with the diagnosis of AFIB.  The various methods of treatment have been discussed with the patient and family. After consideration of risks, benefits and other options for treatment, the patient has consented to  Procedure(s): CARDIOVERSION (N/A) as a surgical intervention.  The patient's history has been reviewed, patient examined, no change in status, stable for surgery.  I have reviewed the patient's chart and labs.  Questions were answered to the patient's satisfaction.     Fransico Him

## 2018-07-24 NOTE — Anesthesia Preprocedure Evaluation (Signed)
Anesthesia Evaluation  Patient identified by MRN, date of birth, ID band Patient awake    Reviewed: Allergy & Precautions, H&P , NPO status , Patient's Chart, lab work & pertinent test results, reviewed documented beta blocker date and time   Airway Mallampati: II  TM Distance: >3 FB Neck ROM: Full    Dental no notable dental hx. (+) Edentulous Upper, Dental Advisory Given   Pulmonary neg pulmonary ROS,    Pulmonary exam normal breath sounds clear to auscultation       Cardiovascular hypertension, Pt. on medications and Pt. on home beta blockers +CHF  + dysrhythmias Atrial Fibrillation  Rhythm:Irregular Rate:Normal     Neuro/Psych Anxiety negative neurological ROS     GI/Hepatic Neg liver ROS, GERD  Medicated and Controlled,  Endo/Other  negative endocrine ROS  Renal/GU negative Renal ROS  negative genitourinary   Musculoskeletal  (+) Arthritis ,   Abdominal   Peds  Hematology negative hematology ROS (+)   Anesthesia Other Findings   Reproductive/Obstetrics negative OB ROS                             Anesthesia Physical Anesthesia Plan  ASA: III  Anesthesia Plan: General   Post-op Pain Management:    Induction: Intravenous  PONV Risk Score and Plan: 2 and Treatment may vary due to age or medical condition  Airway Management Planned: Mask  Additional Equipment:   Intra-op Plan:   Post-operative Plan:   Informed Consent: I have reviewed the patients History and Physical, chart, labs and discussed the procedure including the risks, benefits and alternatives for the proposed anesthesia with the patient or authorized representative who has indicated his/her understanding and acceptance.     Dental advisory given  Plan Discussed with: CRNA  Anesthesia Plan Comments:         Anesthesia Quick Evaluation

## 2018-07-25 ENCOUNTER — Encounter (HOSPITAL_COMMUNITY): Payer: Self-pay | Admitting: Cardiology

## 2018-08-13 NOTE — Progress Notes (Signed)
Cardiology Office Note   Date:  08/14/2018   ID:  Jose Snyder, DOB 1940/04/28, MRN [id number]  PCP:  Verline Lema, MD  Cardiologist: [facility] [place]  No chief complaint on file.    History of Present Illness: Jose Snyder is a 78 y.o. male who presents for ongoing assessment and management of atrial fibrillation, nonischemic cardiomyopathy with an EF of 20% to 25%, most recent left heart cath demonstrated moderate nonobstructive CAD with most significant lesion in the mid diagonal 1 with 75% stenosis.  The patient had a DCCV on amiodarone but did not hold sinus rhythm.  Was seen by Dr. Rayann Heman and referred to Dr. Roxy Manns for surgical Maze procedure.  The patient did undergo Maze procedure with clipping of left atrial appendage on October 29, 2013.  A CTA on April 2018 revealed focal saccular outpouching of the distal descending thoracic aorta with no intramural hematoma or inflammatory changes.  It was felt that it was early ulcer formation and not a penetrating ulcer per se.  The images were reviewed by Dr. Roxy Manns who recommended no further follow-up at that time.  Follow-up echocardiogram in October 2019 revealed normal LV function with moderate left ventricular hypertrophy, mildly dilated ascending aorta and mild mitral regurg.  The patient did return to atrial flutter, and had successful cardioversion in January 2020.  Was seen last by Dr. Stanford Breed on 07/18/2018 at the patient's request as he felt he had gone back into atrial fibrillation.  He was symptomatic with shortness of breath and mild edema.  He was noted to be back in atrial fibrillation with RVR at 130 bpm.  It was felt that he would benefit from repeat cardioversion as he had been compliant with apixaban.  Because of close proximity to recent episode he was placed on antiarrhythmic to maintain sinus rhythm and therefore amiodarone 200 mg twice daily for 2 weeks and then 200 mg daily was planned.  The patient had biphasic defibrillation by Dr.  Fransico Him on 07/24/2018 which was successful cardioversion of atrial flutter to normal sinus rhythm.  Today  patient states he feels well.  He states he is back to his normal activity and able to walk to his mailbox.  He does notice that he has to take breaks with more intense activity.  He states he has gained about 10 pounds with the recent pandemic.  He has been following a low-sodium diet. He denies chest pain, shortness of breath, palpitations, dizziness, weakness, hematuria, melena, hemoptysis, orthopnea, and PND.   Past Medical History:  Diagnosis Date  . Anxiety   . Arthritis    R hand- OA  . Atrial Fibrillation  06/25/2013  . CAD (coronary artery disease), native coronary artery 06/28/2013   a. LHC (06/27/13):  Mid LAD 40%, mid D1 75%, mid CFX 50-60%, mid RCA 50%, EF 20%, LVEDP 24 mmHg.  . CHF (congestive heart failure) ([place])   . Chronic combined systolic and diastolic heart failure ([place]) 06/26/2013   a.  Echo (06/25/13):  EF 20-25%, diff HK, restrictive physio, mild MR, mod to severe LAE, mild RVE, mildly reduced RVSF, mod to severe RAE  . Chronic kidney disease   . Dyslipidemia 07/15/2013  . Dysrhythmia    a-fib  . GERD (gastroesophageal reflux disease)   . Hypertension   . Incidental pulmonary nodule 06/22/2014   Seen on CT angiogram of chest  . NICM (nonischemic cardiomyopathy) with EF 20-25% 06/26/2013  . Obesity   . Penetrating atherosclerotic ulcer of aorta ([place]) 10/22/2013  Small penetrating ulcer of descending thoracic aorta discovered on routine CTA  . Prostate cancer ([place])    watchful waiting, no treatment so far  . S/P Maze operation for atrial fibrillation 10/29/2013   Complete bilateral atrial lesion set using cryothermy and bipolar radiofrequency ablation with clipping of LA appendage via right mini thoracotomy  . Shortness of breath    seen by Stanford Breed, & low energy     Past Surgical History:  Procedure Laterality Date  . CARDIAC CATHETERIZATION  06/2013  .  CARDIOVERSION N/A 06/30/2013   Procedure: CARDIOVERSION;  Surgeon: [name], MD;  Location: [place];  Service: Cardiovascular;  Laterality: N/A;  . CARDIOVERSION N/A 07/31/2013   Procedure: CARDIOVERSION;  Surgeon: Thayer Headings, MD;  Location: [place];  Service: Cardiovascular;  Laterality: N/A;  . CARDIOVERSION N/A 03/19/2018   Procedure: CARDIOVERSION;  Surgeon: [name], MD;  Location: [facility] ENDOSCOPY;  Service: Cardiovascular;  Laterality: N/A;  . CARDIOVERSION N/A 07/24/2018   Procedure: CARDIOVERSION;  Surgeon: [name], MD;  Location: [facility] ENDOSCOPY;  Service: Cardiovascular;  Laterality: N/A;  . CLIPPING OF ATRIAL APPENDAGE  10/29/2013   Procedure: CLIPPING OF ATRIAL APPENDAGE;  Surgeon: Rexene Alberts, MD;  Location: [place];  Service: Open Heart Surgery;;  . INTRAOPERATIVE TRANSESOPHAGEAL ECHOCARDIOGRAM N/A 10/29/2013   Procedure: INTRAOPERATIVE TRANSESOPHAGEAL ECHOCARDIOGRAM;  Surgeon: Rexene Alberts, MD;  Location: [place];  Service: Open Heart Surgery;  Laterality: N/A;  . LEFT AND RIGHT HEART CATHETERIZATION WITH CORONARY ANGIOGRAM N/A 06/27/2013   Procedure: LEFT AND RIGHT HEART CATHETERIZATION WITH CORONARY ANGIOGRAM;  Surgeon: Jettie Booze, MD;  Location: [facility] CATH LAB;  Service: Cardiovascular;  Laterality: N/A;  . MEDIAL PARTIAL KNEE REPLACEMENT Left 1990's?  Marland Kitchen MINIMALLY INVASIVE MAZE PROCEDURE N/A 10/29/2013   Procedure: MINIMALLY INVASIVE MAZE PROCEDURE;  Surgeon: Rexene Alberts, MD;  Location: [place];  Service: Open Heart Surgery;  Laterality: N/A;  . TEE WITHOUT CARDIOVERSION N/A 06/30/2013   Procedure: TRANSESOPHAGEAL ECHOCARDIOGRAM (TEE) ;  Surgeon: [name], MD;  Location: [place];  Service: Cardiovascular;  Laterality: N/A;  . TRANSURETHRAL RESECTION OF PROSTATE  2010     Current Outpatient Medications  Medication Sig Dispense Refill  . amiodarone (PACERONE) 200 MG tablet Take 1 tablet (200 mg total) by mouth 2 (two) times daily. 14 tablet  0  . amiodarone (PACERONE) 200 MG tablet Take 1 tablet (200 mg total) by mouth daily. 90 tablet 3  . amLODipine (NORVASC) 5 MG tablet Take 1 tablet (5 mg total) by mouth daily at 6 PM.    . apixaban (ELIQUIS) 5 MG TABS tablet Take 1 tablet (5 mg total) by mouth 2 (two) times daily. 60 tablet 11  . atorvastatin (LIPITOR) 80 MG tablet Take 1 tablet (80 mg total) by mouth daily with breakfast. 30 tablet 12  . docusate sodium (COLACE) 100 MG capsule Take 400 mg by mouth at bedtime as needed for mild constipation.     . furosemide (LASIX) 40 MG tablet Take 1 tablet (40 mg total) by mouth 2 (two) times daily. If swelling takes an extra dose 60 tablet 6  . ibuprofen (ADVIL,MOTRIN) 200 MG tablet Take 400 mg by mouth every 8 (eight) hours as needed for headache or moderate pain.     Marland Kitchen lisinopril (PRINIVIL,ZESTRIL) 40 MG tablet Take 40 mg by mouth daily.    . metoprolol (TOPROL-XL) 200 MG 24 hr tablet Take 200 mg by mouth daily. In the morning.    Marland Kitchen  metoprolol succinate (TOPROL XL) 25 MG 24 hr tablet Take 1 tablet (25 mg total) by mouth at bedtime.    Marland Kitchen omeprazole (PRILOSEC) 20 MG capsule Take 20 mg by mouth daily.    . potassium chloride SA (K-DUR,KLOR-CON) 20 MEQ tablet Take 1 tablet (20 mEq total) by mouth daily. 30 tablet 11  . temazepam (RESTORIL) 15 MG capsule Take 15 mg by mouth at bedtime as needed for sleep.      Current Facility-Administered Medications  Medication Dose Route Frequency Provider Last Rate Last Dose  . sodium chloride flush (NS) 0.9 % injection 3 mL  3 mL Intravenous Q12H Crenshaw, Denice Bors, MD        Allergies:   Patient has no known allergies.    Social History:  The patient  reports that he has never smoked. He has never used smokeless tobacco. He reports current alcohol use of about 14.0 standard drinks of alcohol per week. He reports that he does not use drugs.   Family History:  The patient's family history includes Healthy in his father, mother, and sister.    ROS:  All other systems are reviewed and negative. Unless otherwise mentioned in H&P    PHYSICAL EXAM: VS:  BP (!) 148/82 (BP Location: Left Arm, Patient Position: Sitting, Cuff Size: Large)   Pulse 62   Temp (!) 97.3 F (36.3 C)   Ht 6\' 2"  (1.88 m)   Wt 272 lb 3.2 oz (123.5 kg)   SpO2 96%   BMI 34.95 kg/m  , BMI Body mass index is 34.95 kg/m. GEN: Well nourished, well developed, in no acute distress HEENT: normal Neck: no JVD, carotid bruits, or masses Cardiac: RRR; no murmurs, rubs, or gallops,no edema  Respiratory:  Clear to auscultation bilaterally, normal work of breathing GI: soft, nontender, nondistended, + BS MS: no deformity or atrophy Skin: warm and dry, no rash Neuro:  Strength and sensation are intact Psych: euthymic mood, full affect   EKG:  EKG is ordered today. The ekg ordered today demonstrates sinus arrhythmia/sinus bradycardia with PACs 58 bpm.   Recent Labs: 07/24/2018: Hemoglobin 13.3; Potassium 4.8; Sodium 138    Lipid Panel    Component Value Date/Time   CHOL 114 (L) 11/16/2015 0916   TRIG 178 (H) 11/16/2015 0916   HDL 34 (L) 11/16/2015 0916   CHOLHDL 3.4 11/16/2015 0916   VLDL 36 (H) 11/16/2015 0916   LDLCALC 44 11/16/2015 0916      Wt Readings from Last 3 Encounters:  08/14/18 272 lb 3.2 oz (123.5 kg)  07/18/18 262 lb (118.8 kg)  04/24/18 263 lb (119.3 kg)      Other studies Reviewed: Echocardiogram 12/16/18 Left ventricle: The cavity size was normal. Wall thickness was   increased in a pattern of moderate LVH. Systolic function was   normal. The estimated ejection fraction was in the range of 55%   to 60%. Wall motion was normal; there were no regional wall   motion abnormalities. The study is not technically sufficient to   allow evaluation of LV diastolic function. - Ascending aorta: The ascending aorta was moderately dilated. - Mitral valve: There was mild regurgitation.  ASSESSMENT AND PLAN:  1.  PAF- sinus arrhythmia/sinus  bradycardia with PACs Continue amiodarone 200 mg tablet daily Continue metoprolol 200 mg tablet daily Continue metoprolol succinate 25 mg tablet at bedtime Continue apixaban 2.  Essential hypertension- well-controlled with home blood pressure measurements.  Patient instructed to bring blood pressure cuff blood pressure log  to next appointment. Continue amlodipine 5 mg tablet Continue lisinopril 40 mg tablet Continue low-sodium diet Encouraged weight loss and increased physical activity 3.  Chronic combined systolic and diastolic heart failure- no shortness of breath/increased activity intolerance Continue furosemide 40 mg tablet twice daily Continue potassium chloride 20 mEq daily Patient encouraged to monitor heart rate and blood pressure and bring log to next appointment Continue low-sodium diet 4.  CAD-no chest pain Continue current medical management 5.  Pure hypercholesterolemia- LDL 44 (11/16/2015) Continue atorvastatin 80 mg tablet Increase physical activity as tolerated. Monitored by PCP.  Disposition: Follow-up with Dr. Stanford Breed September 9.   Current medicines are reviewed at length with the patient today.    Labs/ tests ordered today include: EKG see above. Coletta Memos, NP   08/14/2018 10:45 AM    [place] [address] Office [phone] Fax [phone]

## 2018-08-14 ENCOUNTER — Encounter: Payer: Self-pay | Admitting: Adult Health

## 2018-08-14 ENCOUNTER — Ambulatory Visit (INDEPENDENT_AMBULATORY_CARE_PROVIDER_SITE_OTHER): Payer: Medicare Other | Admitting: Adult Health

## 2018-08-14 ENCOUNTER — Other Ambulatory Visit: Payer: Self-pay

## 2018-08-14 VITALS — BP 148/82 | HR 62 | Temp 97.3°F | Ht 74.0 in | Wt 272.2 lb

## 2018-08-14 DIAGNOSIS — I5042 Chronic combined systolic (congestive) and diastolic (congestive) heart failure: Secondary | ICD-10-CM

## 2018-08-14 DIAGNOSIS — I2510 Atherosclerotic heart disease of native coronary artery without angina pectoris: Secondary | ICD-10-CM

## 2018-08-14 DIAGNOSIS — E7800 Pure hypercholesterolemia, unspecified: Secondary | ICD-10-CM

## 2018-08-14 DIAGNOSIS — I480 Paroxysmal atrial fibrillation: Secondary | ICD-10-CM | POA: Diagnosis not present

## 2018-08-14 DIAGNOSIS — I10 Essential (primary) hypertension: Secondary | ICD-10-CM

## 2018-08-14 NOTE — Patient Instructions (Signed)
Follow-Up: You will need a follow up appointment in [place].  You may see Kirk Ruths, MD , Coletta Memos, NP or one of the following Advanced Practice Providers on your designated Care Team:  Kerin Ransom, [place]  Roby Lofts, PA-C Sande Rives, [place]       Medication Instructions:  NO CHANGES- Your physician recommends that you continue on your current medications as directed. Please refer to the Current Medication list given to you today. If you need a refill on your cardiac medications before your next appointment, please call your pharmacy. Labwork: When you have labs (blood work) and your tests are completely normal, you will receive your results ONLY by [place] (if you have MyChart) -OR- A paper copy in the mail.  At [facility], you and your health needs are our priority.  As part of our continuing mission to provide you with exceptional heart care, we have created designated Provider Care Teams.  These Care Teams include your primary Cardiologist (physician) and Advanced Practice Providers (APPs -  Physician Assistants and Nurse Practitioners) who all work together to provide you with the care you need, when you need it.  Thank you for choosing [facility] at [facility]!!

## 2018-09-25 ENCOUNTER — Encounter: Payer: Self-pay | Admitting: Podiatry

## 2018-09-25 ENCOUNTER — Ambulatory Visit (INDEPENDENT_AMBULATORY_CARE_PROVIDER_SITE_OTHER): Payer: Medicare Other | Admitting: Podiatry

## 2018-09-25 ENCOUNTER — Other Ambulatory Visit: Payer: Self-pay

## 2018-09-25 VITALS — Temp 97.1°F

## 2018-09-25 DIAGNOSIS — D689 Coagulation defect, unspecified: Secondary | ICD-10-CM

## 2018-09-25 DIAGNOSIS — M79676 Pain in unspecified toe(s): Secondary | ICD-10-CM

## 2018-09-25 DIAGNOSIS — B351 Tinea unguium: Secondary | ICD-10-CM

## 2018-09-25 NOTE — Progress Notes (Signed)
Patient ID: Jose Snyder, male   DOB: April 05, 1940, 78 y.o.   MRN: [id number] Complaint:  Visit Type: Patient returns to my office for continued preventative foot care services. Complaint: Patient states" my nails have grown long and thick and become painful to walk and wear shoes. The patient presents for preventative foot care services.  Patient is on eliquiss.  Podiatric Exam: Vascular: dorsalis pedis and posterior tibial pulses are palpable bilateral. Capillary return is immediate. Temperature gradient is WNL. Skin turgor WNL  Sensorium: Normal Semmes Weinstein monofilament test. Normal tactile sensation bilaterally. Nail Exam: Pt has thick disfigured discolored nails with subungual debris noted bilateral entire nail hallux through fifth toenails Ulcer Exam: There is no evidence of ulcer or pre-ulcerative changes or infection. Orthopedic Exam: Muscle tone and strength are WNL. No limitations in general ROM. No crepitus or effusions noted. Foot type and digits show no abnormalities. Bony prominences are unremarkable. Skin: No Porokeratosis. No infection or ulcers  Diagnosis:  Onychomycosis, , Pain in right toe, pain in left toes  Treatment & Plan Procedures and Treatment: Consent by patient was obtained for treatment procedures. The patient understood the discussion of treatment and procedures well. All questions were answered thoroughly reviewed. Debridement of mycotic and hypertrophic toenails, 1 through 5 bilateral and clearing of subungual debris. No ulceration, no infection noted.  Return Visit-Office Procedure: Patient instructed to return to the office for a follow up visit 3 months for continued evaluation and treatment.  [name] DPM

## 2018-10-21 ENCOUNTER — Ambulatory Visit: Payer: Medicare Other | Admitting: Cardiology

## 2018-11-05 NOTE — Progress Notes (Signed)
HPI:  FU atrial fibrillation. Echo 4/15 EF 20-25%. LHC demonstrated mod non-obs CAD. Most significant lesion was a mid D1 with 75% stenosis. DCM was felt to be out of proportion to CAD (NICM). Had DCCV on amiodarone but did not hold sinus. He was seen by Dr. Rayann Heman and referred to Dr. Roxy Manns for surgical maze. Carotid Dopplers August 2015 showed 1-39% bilateral stenosis. Patient underwent maze procedure with clipping of left atrial appendage on October 29, 2013.CTA April 2018 showed focal saccular outpouching of the distal descending thoracic aorta with no intramural hematoma or inflammatory changes. Findings felt possibly early ulcer formation and not a penetrating ulcer per se. Images were reviewed by Dr. Roxy Manns and no further follow-up recommended. Echocardiogram October 2019 showed normal LV function, moderate left ventricular hypertrophy, mildly dilated ascending aorta and mild mitral regurgitation. At previous office visit May 2020 patient noted to be in atrial flutter.  Had successful cardioversion Jul 24, 2018.  Since last seen,  he has some dyspnea on exertion but no orthopnea, PND, pedal edema, chest pain or syncope.  Current Outpatient Medications  Medication Sig Dispense Refill  . amiodarone (PACERONE) 200 MG tablet Take 1 tablet (200 mg total) by mouth daily. 90 tablet 3  . amLODipine (NORVASC) 5 MG tablet Take 1 tablet (5 mg total) by mouth daily at 6 PM.    . apixaban (ELIQUIS) 5 MG TABS tablet Take 1 tablet (5 mg total) by mouth 2 (two) times daily. 60 tablet 11  . atorvastatin (LIPITOR) 80 MG tablet Take 1 tablet (80 mg total) by mouth daily with breakfast. 30 tablet 12  . docusate sodium (COLACE) 100 MG capsule Take 400 mg by mouth at bedtime as needed for mild constipation.     . furosemide (LASIX) 40 MG tablet Take 1 tablet (40 mg total) by mouth 2 (two) times daily. If swelling takes an extra dose 60 tablet 6  . ibuprofen (ADVIL,MOTRIN) 200 MG tablet Take 400 mg by mouth  every 8 (eight) hours as needed for headache or moderate pain.     Marland Kitchen lisinopril (PRINIVIL,ZESTRIL) 40 MG tablet Take 40 mg by mouth daily.    . metoprolol (TOPROL-XL) 200 MG 24 hr tablet Take 200 mg by mouth daily. In the morning.    . metoprolol succinate (TOPROL XL) 25 MG 24 hr tablet Take 1 tablet (25 mg total) by mouth at bedtime.    Marland Kitchen omeprazole (PRILOSEC) 20 MG capsule Take 20 mg by mouth daily.    . potassium chloride SA (K-DUR,KLOR-CON) 20 MEQ tablet Take 1 tablet (20 mEq total) by mouth daily. 30 tablet 11  . temazepam (RESTORIL) 15 MG capsule Take 15 mg by mouth at bedtime as needed for sleep.      Current Facility-Administered Medications  Medication Dose Route Frequency Provider Last Rate Last Dose  . sodium chloride flush (NS) 0.9 % injection 3 mL  3 mL Intravenous Q12H Lelon Perla, MD         Past Medical History:  Diagnosis Date  . Anxiety   . Arthritis    R hand- OA  . Atrial Fibrillation  06/25/2013  . CAD (coronary artery disease), native coronary artery 06/28/2013   a. LHC (06/27/13):  Mid LAD 40%, mid D1 75%, mid CFX 50-60%, mid RCA 50%, EF 20%, LVEDP 24 mmHg.  . CHF (congestive heart failure) ([place])   . Chronic combined systolic and diastolic heart failure ([place]) 06/26/2013   a.  Echo (06/25/13):  EF 20-25%, diff HK, restrictive physio, mild MR, mod to severe LAE, mild RVE, mildly reduced RVSF, mod to severe RAE  . Chronic kidney disease   . Dyslipidemia 07/15/2013  . Dysrhythmia    a-fib  . GERD (gastroesophageal reflux disease)   . Hypertension   . Incidental pulmonary nodule 06/22/2014   Seen on CT angiogram of chest  . NICM (nonischemic cardiomyopathy) with EF 20-25% 06/26/2013  . Obesity   . Penetrating atherosclerotic ulcer of aorta ([place]) 10/22/2013   Small penetrating ulcer of descending thoracic aorta discovered on routine CTA  . Prostate cancer ([place])    watchful waiting, no treatment so far  . S/P Maze operation for atrial fibrillation 10/29/2013    Complete bilateral atrial lesion set using cryothermy and bipolar radiofrequency ablation with clipping of LA appendage via right mini thoracotomy  . Shortness of breath    seen by Stanford Breed, & low energy     Past Surgical History:  Procedure Laterality Date  . CARDIAC CATHETERIZATION  06/2013  . CARDIOVERSION N/A 06/30/2013   Procedure: CARDIOVERSION;  Surgeon: [name], MD;  Location: [place];  Service: Cardiovascular;  Laterality: N/A;  . CARDIOVERSION N/A 07/31/2013   Procedure: CARDIOVERSION;  Surgeon: Thayer Headings, MD;  Location: [place];  Service: Cardiovascular;  Laterality: N/A;  . CARDIOVERSION N/A 03/19/2018   Procedure: CARDIOVERSION;  Surgeon: [name], MD;  Location: [facility] ENDOSCOPY;  Service: Cardiovascular;  Laterality: N/A;  . CARDIOVERSION N/A 07/24/2018   Procedure: CARDIOVERSION;  Surgeon: [name], MD;  Location: [facility] ENDOSCOPY;  Service: Cardiovascular;  Laterality: N/A;  . CLIPPING OF ATRIAL APPENDAGE  10/29/2013   Procedure: CLIPPING OF ATRIAL APPENDAGE;  Surgeon: Rexene Alberts, MD;  Location: [place];  Service: Open Heart Surgery;;  . INTRAOPERATIVE TRANSESOPHAGEAL ECHOCARDIOGRAM N/A 10/29/2013   Procedure: INTRAOPERATIVE TRANSESOPHAGEAL ECHOCARDIOGRAM;  Surgeon: Rexene Alberts, MD;  Location: [place];  Service: Open Heart Surgery;  Laterality: N/A;  . LEFT AND RIGHT HEART CATHETERIZATION WITH CORONARY ANGIOGRAM N/A 06/27/2013   Procedure: LEFT AND RIGHT HEART CATHETERIZATION WITH CORONARY ANGIOGRAM;  Surgeon: Jettie Booze, MD;  Location: [facility] CATH LAB;  Service: Cardiovascular;  Laterality: N/A;  . MEDIAL PARTIAL KNEE REPLACEMENT Left 1990's?  Marland Kitchen MINIMALLY INVASIVE MAZE PROCEDURE N/A 10/29/2013   Procedure: MINIMALLY INVASIVE MAZE PROCEDURE;  Surgeon: Rexene Alberts, MD;  Location: [place];  Service: Open Heart Surgery;  Laterality: N/A;  . TEE WITHOUT CARDIOVERSION N/A 06/30/2013   Procedure: TRANSESOPHAGEAL ECHOCARDIOGRAM (TEE) ;  Surgeon: [name], MD;  Location: [place];  Service: Cardiovascular;  Laterality: N/A;  . TRANSURETHRAL RESECTION OF PROSTATE  2010    Social History   Socioeconomic History  . Marital status: Widowed    Spouse name: Not on file  . Number of children: Not on file  . Years of education: Not on file  . Highest education level: Not on file  Occupational History  . Not on file  Social Needs  . Financial resource strain: Not on file  . Food insecurity    Worry: Not on file    Inability: Not on file  . Transportation needs    Medical: Not on file    Non-medical: Not on file  Tobacco Use  . Smoking status: Never Smoker  . Smokeless tobacco: Never Used  . Tobacco comment: 06/25/2013 "smoked very light; years and years ago"  Substance and Sexual Activity  . Alcohol use: Yes    Alcohol/week: 14.0 standard  drinks    Types: 7 Glasses of wine, 7 Shots of liquor per week  . Drug use: No  . Sexual activity: Not Currently  Lifestyle  . Physical activity    Days per week: Not on file    Minutes per session: Not on file  . Stress: Not on file  Relationships  . Social Herbalist on phone: Not on file    Gets together: Not on file    Attends religious service: Not on file    Active member of club or organization: Not on file    Attends meetings of clubs or organizations: Not on file    Relationship status: Not on file  . Intimate partner violence    Fear of current or ex partner: Not on file    Emotionally abused: Not on file    Physically abused: Not on file    Forced sexual activity: Not on file  Other Topics Concern  . Not on file  Social History Narrative   He lives in [place]. He is widowed. Wife passed of breast cancer last year. No children.     Family History  Problem Relation Age of Onset  . Healthy Father   . Healthy Mother   . Healthy Sister     ROS: no fevers or chills, productive cough, hemoptysis, dysphasia, odynophagia, melena, hematochezia, dysuria,  hematuria, rash, seizure activity, orthopnea, PND, pedal edema, claudication. Remaining systems are negative.  Physical Exam: Well-developed obese in no acute distress.  Skin is warm and dry.  HEENT is normal.  Neck is supple.  Chest is clear to auscultation with normal expansion.  Cardiovascular exam is regular rate and rhythm.  Abdominal exam nontender or distended. No masses palpated. Extremities show no edema. neuro grossly intact  ECG-sinus rhythm at a rate of 59, right axis deviation.  Personally reviewed  A/P  1 paroxysmal atrial fibrillation/flutter.  Patient remains in sinus rhythm today.  Continue amiodarone at present dose.  Check TSH, liver functions and chest x-ray.  Continue apixaban.  Check hemoglobin and renal function.  2 chronic diastolic congestive heart failure-patient doing well from a volume standpoint.  Continue present dose of Lasix.  3 hypertension-blood pressure is controlled.  Continue present medications and follow.  4 hyperlipidemia-continue statin.  5 coronary artery disease-continue statin.  No aspirin given need for apixaban.  6 history of nonischemic cardiomyopathy-LV function improved on most recent echocardiogram.  Continue ACE inhibitor and beta-blocker.  7 history of penetrating atherosclerotic aortic ulcer-previously seen by Dr. Roxy Manns.  No further imaging or follow-up recommended.  Kirk Ruths, MD

## 2018-11-06 ENCOUNTER — Encounter: Payer: Self-pay | Admitting: Cardiology

## 2018-11-06 ENCOUNTER — Ambulatory Visit (HOSPITAL_BASED_OUTPATIENT_CLINIC_OR_DEPARTMENT_OTHER)
Admission: RE | Admit: 2018-11-06 | Discharge: 2018-11-06 | Disposition: A | Discharge status: Home / Self Care | Payer: Medicare Other | Source: Ambulatory Visit | Attending: Cardiology | Admitting: Cardiology

## 2018-11-06 ENCOUNTER — Other Ambulatory Visit: Payer: Self-pay

## 2018-11-06 ENCOUNTER — Ambulatory Visit (INDEPENDENT_AMBULATORY_CARE_PROVIDER_SITE_OTHER): Payer: Medicare Other | Admitting: Cardiology

## 2018-11-06 VITALS — BP 140/68 | HR 59 | Ht 74.0 in | Wt 269.4 lb

## 2018-11-06 DIAGNOSIS — I10 Essential (primary) hypertension: Secondary | ICD-10-CM | POA: Diagnosis not present

## 2018-11-06 DIAGNOSIS — Z79899 Other long term (current) drug therapy: Secondary | ICD-10-CM | POA: Diagnosis not present

## 2018-11-06 DIAGNOSIS — I4891 Unspecified atrial fibrillation: Secondary | ICD-10-CM

## 2018-11-06 DIAGNOSIS — I480 Paroxysmal atrial fibrillation: Secondary | ICD-10-CM

## 2018-11-06 DIAGNOSIS — I2510 Atherosclerotic heart disease of native coronary artery without angina pectoris: Secondary | ICD-10-CM

## 2018-11-06 NOTE — Patient Instructions (Addendum)
Your physician recommends that you continue on your current medications as directed. Please refer to the Current Medication list given to you today.  Your physician recommends that you return for lab work in:  TODAY BMET CBC LIVER TSH  A chest x-ray takes a picture of the organs and structures inside the chest, including the heart, lungs, and blood vessels. This test can show several things, including, whether the heart is enlarges; whether fluid is building up in the lungs; and whether pacemaker / defibrillator leads are still in place.  Your physician wants you to follow-up in: [place] will receive a reminder letter in the mail two months in advance. If you don't receive a letter, please call our office to schedule the follow-up appointment.

## 2018-11-07 LAB — CBC
Hematocrit: 39.1 % (ref 37.5–51.0)
Hemoglobin: 13.2 g/dL (ref 13.0–17.7)
MCH: 32.8 pg (ref 26.6–33.0)
MCHC: 33.8 g/dL (ref 31.5–35.7)
MCV: 97 fL (ref 79–97)
Platelets: 181 10*3/uL (ref 150–450)
RBC: 4.03 x10E6/uL — ABNORMAL LOW (ref 4.14–5.80)
RDW: 13.6 % (ref 11.6–15.4)
WBC: 4.4 10*3/uL (ref 3.4–10.8)

## 2018-11-07 LAB — BASIC METABOLIC PANEL
BUN/Creatinine Ratio: 10 (ref 10–24)
BUN: 21 mg/dL (ref 8–27)
CO2: 23 mmol/L (ref 20–29)
Calcium: 9.4 mg/dL (ref 8.6–10.2)
Chloride: 98 mmol/L (ref 96–106)
Creatinine, Ser: 2.11 mg/dL — ABNORMAL HIGH (ref 0.76–1.27)
GFR calc Af Amer: 34 mL/min/{1.73_m2} — ABNORMAL LOW (ref >59)
GFR calc non Af Amer: 29 mL/min/{1.73_m2} — ABNORMAL LOW (ref >59)
Glucose: 118 mg/dL — ABNORMAL HIGH (ref 65–99)
Potassium: 5.6 mmol/L — ABNORMAL HIGH (ref 3.5–5.2)
Sodium: 136 mmol/L (ref 134–144)

## 2018-11-07 LAB — HEPATIC FUNCTION PANEL
ALT: 33 IU/L (ref 0–44)
AST: 33 IU/L (ref 0–40)
Albumin: 4.5 g/dL (ref 3.7–4.7)
Alkaline Phosphatase: 115 IU/L (ref 39–117)
Bilirubin Total: 0.6 mg/dL (ref 0.0–1.2)
Bilirubin, Direct: 0.24 mg/dL (ref 0.00–0.40)
Total Protein: 6.8 g/dL (ref 6.0–8.5)

## 2018-11-07 LAB — TSH: TSH: 2.74 u[IU]/mL (ref 0.450–4.500)

## 2018-11-08 ENCOUNTER — Telehealth: Payer: Self-pay | Admitting: *Deleted

## 2018-11-08 NOTE — Telephone Encounter (Signed)
-----   Message from Lelon Perla, MD sent at 11/07/2018  7:17 AM EDT ----- Hold lasix for 2 days and then decrease to 40 mg daily thereafter; DC KCL; bmet one week Kirk Ruths

## 2018-11-08 NOTE — Telephone Encounter (Signed)
-----   Message from Lelon Perla, MD sent at 11/07/2018  7:18 AM EDT ----- No amiodarone toxicity Kirk Ruths

## 2018-11-08 NOTE — Telephone Encounter (Signed)
Advised patient of lab/xray results and medication changes,verbalized understanding. Orders placed for recheck 1 week

## 2018-12-25 ENCOUNTER — Other Ambulatory Visit: Payer: Self-pay

## 2018-12-25 ENCOUNTER — Ambulatory Visit (INDEPENDENT_AMBULATORY_CARE_PROVIDER_SITE_OTHER): Payer: Medicare Other | Admitting: Podiatry

## 2018-12-25 ENCOUNTER — Encounter: Payer: Self-pay | Admitting: Podiatry

## 2018-12-25 DIAGNOSIS — M79676 Pain in unspecified toe(s): Secondary | ICD-10-CM

## 2018-12-25 DIAGNOSIS — B351 Tinea unguium: Secondary | ICD-10-CM | POA: Diagnosis not present

## 2018-12-25 DIAGNOSIS — D689 Coagulation defect, unspecified: Secondary | ICD-10-CM | POA: Diagnosis not present

## 2018-12-25 NOTE — Progress Notes (Signed)
Patient ID: Jose Snyder, male   DOB: 07/10/1940, 78 y.o.   MRN: [id number] Complaint:  Visit Type: Patient returns to my office for continued preventative foot care services. Complaint: Patient states" my nails have grown long and thick and become painful to walk and wear shoes. The patient presents for preventative foot care services.  Patient is on eliquiss.  Podiatric Exam: Vascular: dorsalis pedis and posterior tibial pulses are palpable bilateral. Capillary return is immediate. Temperature gradient is WNL. Skin turgor WNL  Sensorium: Normal Semmes Weinstein monofilament test. Normal tactile sensation bilaterally. Nail Exam: Pt has thick disfigured discolored nails with subungual debris noted bilateral entire nail hallux through fifth toenails Ulcer Exam: There is no evidence of ulcer or pre-ulcerative changes or infection. Orthopedic Exam: Muscle tone and strength are WNL. No limitations in general ROM. No crepitus or effusions noted. Foot type and digits show no abnormalities. Bony prominences are unremarkable. Skin: No Porokeratosis. No infection or ulcers  Diagnosis:  Onychomycosis, , Pain in right toe, pain in left toes  Treatment & Plan Procedures and Treatment: Consent by patient was obtained for treatment procedures. The patient understood the discussion of treatment and procedures well. All questions were answered thoroughly reviewed. Debridement of mycotic and hypertrophic toenails, 1 through 5 bilateral and clearing of subungual debris. No ulceration, no infection noted.  Return Visit-Office Procedure: Patient instructed to return to the office for a follow up visit 3 months for continued evaluation and treatment.  Telia Amundson DPM 

## 2019-01-01 ENCOUNTER — Ambulatory Visit: Payer: Medicare Other | Admitting: Podiatry

## 2019-03-26 ENCOUNTER — Ambulatory Visit (INDEPENDENT_AMBULATORY_CARE_PROVIDER_SITE_OTHER): Payer: Medicare Other | Admitting: Podiatry

## 2019-03-26 ENCOUNTER — Encounter: Payer: Self-pay | Admitting: Podiatry

## 2019-03-26 ENCOUNTER — Other Ambulatory Visit: Payer: Self-pay

## 2019-03-26 DIAGNOSIS — M79676 Pain in unspecified toe(s): Secondary | ICD-10-CM | POA: Diagnosis not present

## 2019-03-26 DIAGNOSIS — D689 Coagulation defect, unspecified: Secondary | ICD-10-CM | POA: Diagnosis not present

## 2019-03-26 DIAGNOSIS — B351 Tinea unguium: Secondary | ICD-10-CM

## 2019-03-26 NOTE — Progress Notes (Signed)
Patient ID: Jose Snyder, male   DOB: 1940/05/26, 79 y.o.   MRN: [id number] Complaint:  Visit Type: Patient returns to my office for continued preventative foot care services. Complaint: Patient states" my nails have grown long and thick and become painful to walk and wear shoes. The patient presents for preventative foot care services.  Patient is on eliquiss.  Podiatric Exam: Vascular: dorsalis pedis and posterior tibial pulses are palpable bilateral. Capillary return is immediate. Temperature gradient is WNL. Skin turgor WNL  Sensorium: Normal Semmes Weinstein monofilament test. Normal tactile sensation bilaterally. Nail Exam: Pt has thick disfigured discolored nails with subungual debris noted bilateral entire nail hallux through fifth toenails Ulcer Exam: There is no evidence of ulcer or pre-ulcerative changes or infection. Orthopedic Exam: Muscle tone and strength are WNL. No limitations in general ROM. No crepitus or effusions noted. Foot type and digits show no abnormalities. Bony prominences are unremarkable. Skin: No Porokeratosis. No infection or ulcers  Diagnosis:  Onychomycosis, , Pain in right toe, pain in left toes  Treatment & Plan Procedures and Treatment: Consent by patient was obtained for treatment procedures. The patient understood the discussion of treatment and procedures well. All questions were answered thoroughly reviewed. Debridement of mycotic and hypertrophic toenails, 1 through 5 bilateral and clearing of subungual debris. No ulceration, no infection noted.  Return Visit-Office Procedure: Patient instructed to return to the office for a follow up visit 3 months for continued evaluation and treatment.  [name] DPM

## 2019-03-31 ENCOUNTER — Other Ambulatory Visit: Payer: Self-pay | Admitting: Cardiology

## 2019-03-31 DIAGNOSIS — I480 Paroxysmal atrial fibrillation: Secondary | ICD-10-CM

## 2019-04-10 ENCOUNTER — Telehealth: Payer: Self-pay | Admitting: *Deleted

## 2019-04-10 NOTE — Telephone Encounter (Signed)
LVM RE: FOLLOW UP [name]

## 2019-04-29 NOTE — Progress Notes (Signed)
HPI: FU atrial fibrillation. Echo 4/15 EF 20-25%. LHC demonstrated mod non-obs CAD. Most significant lesion was a mid D1 with 75% stenosis. DCM was felt to be out of proportion to CAD (NICM). Had DCCV on amiodarone but did not hold sinus. He was seen by Dr. Rayann Heman and referred to Dr. Roxy Manns for surgical maze. Carotid Dopplers August 2015 showed 1-39% bilateral stenosis. Patient underwent maze procedure with clipping of left atrial appendage on October 29, 2013.CTA April 2018 showed focal saccular outpouching of the distal descending thoracic aorta with no intramural hematoma or inflammatory changes. Findings felt possibly early ulcer formation and not a penetrating ulcer per se. Images were reviewed by Dr. Roxy Manns and no further follow-up recommended. Echocardiogram October 2019 showed normal LV function, moderate left ventricular hypertrophy, mildly dilated ascending aorta and mild mitral regurgitation. At previous office visit May 2020 patient noted to be in atrial flutter.  Had successful cardioversion Jul 24, 2018.  Since last seen, the patient has dyspnea with more extreme activities but not with routine activities. It is relieved with rest. It is not associated with chest pain. There is no orthopnea, PND or pedal edema. There is no syncope or palpitations. There is no exertional chest pain.   Current Outpatient Medications  Medication Sig Dispense Refill  . amiodarone (PACERONE) 200 MG tablet Take 1 tablet (200 mg total) by mouth daily. 90 tablet 3  . amLODipine (NORVASC) 5 MG tablet Take 1 tablet (5 mg total) by mouth daily at 6 PM.    . apixaban (ELIQUIS) 5 MG TABS tablet Take 1 tablet (5 mg total) by mouth 2 (two) times daily. 60 tablet 11  . atorvastatin (LIPITOR) 80 MG tablet Take 1 tablet (80 mg total) by mouth daily with breakfast. 30 tablet 12  . docusate sodium (COLACE) 100 MG capsule Take 400 mg by mouth at bedtime as needed for mild constipation.     . furosemide (LASIX) 40 MG  tablet Take 40 mg by mouth daily.    Marland Kitchen ibuprofen (ADVIL,MOTRIN) 200 MG tablet Take 400 mg by mouth every 8 (eight) hours as needed for headache or moderate pain.     Marland Kitchen lisinopril (PRINIVIL,ZESTRIL) 40 MG tablet Take 40 mg by mouth daily.    . metoprolol (TOPROL-XL) 200 MG 24 hr tablet Take 200 mg by mouth daily. In the morning.    . metoprolol succinate (TOPROL-XL) 25 MG 24 hr tablet TAKE ONE (1) TABLET BY MOUTH EVERY DAY 90 tablet 1  . omeprazole (PRILOSEC) 20 MG capsule Take 20 mg by mouth daily.    . temazepam (RESTORIL) 15 MG capsule Take 15 mg by mouth at bedtime as needed for sleep.      Current Facility-Administered Medications  Medication Dose Route Frequency Provider Last Rate Last Admin  . sodium chloride flush (NS) 0.9 % injection 3 mL  3 mL Intravenous Q12H Stanford Breed Denice Bors, MD         Past Medical History:  Diagnosis Date  . Anxiety   . Arthritis    R hand- OA  . Atrial Fibrillation  06/25/2013  . CAD (coronary artery disease), native coronary artery 06/28/2013   a. LHC (06/27/13):  Mid LAD 40%, mid D1 75%, mid CFX 50-60%, mid RCA 50%, EF 20%, LVEDP 24 mmHg.  . CHF (congestive heart failure) ([place])   . Chronic combined systolic and diastolic heart failure ([place]) 06/26/2013   a.  Echo (06/25/13):  EF 20-25%, diff HK, restrictive physio, mild MR,  mod to severe LAE, mild RVE, mildly reduced RVSF, mod to severe RAE  . Chronic kidney disease   . Dyslipidemia 07/15/2013  . Dysrhythmia    a-fib  . GERD (gastroesophageal reflux disease)   . Hypertension   . Incidental pulmonary nodule 06/22/2014   Seen on CT angiogram of chest  . NICM (nonischemic cardiomyopathy) with EF 20-25% 06/26/2013  . Obesity   . Penetrating atherosclerotic ulcer of aorta ([place]) 10/22/2013   Small penetrating ulcer of descending thoracic aorta discovered on routine CTA  . Prostate cancer ([place])    watchful waiting, no treatment so far  . S/P Maze operation for atrial fibrillation 10/29/2013   Complete bilateral  atrial lesion set using cryothermy and bipolar radiofrequency ablation with clipping of LA appendage via right mini thoracotomy  . Shortness of breath    seen by Stanford Breed, & low energy     Past Surgical History:  Procedure Laterality Date  . CARDIAC CATHETERIZATION  06/2013  . CARDIOVERSION N/A 06/30/2013   Procedure: CARDIOVERSION;  Surgeon: [name], MD;  Location: [place];  Service: Cardiovascular;  Laterality: N/A;  . CARDIOVERSION N/A 07/31/2013   Procedure: CARDIOVERSION;  Surgeon: Thayer Headings, MD;  Location: [place];  Service: Cardiovascular;  Laterality: N/A;  . CARDIOVERSION N/A 03/19/2018   Procedure: CARDIOVERSION;  Surgeon: [name], MD;  Location: [facility] ENDOSCOPY;  Service: Cardiovascular;  Laterality: N/A;  . CARDIOVERSION N/A 07/24/2018   Procedure: CARDIOVERSION;  Surgeon: [name], MD;  Location: [facility] ENDOSCOPY;  Service: Cardiovascular;  Laterality: N/A;  . CLIPPING OF ATRIAL APPENDAGE  10/29/2013   Procedure: CLIPPING OF ATRIAL APPENDAGE;  Surgeon: Rexene Alberts, MD;  Location: [place];  Service: Open Heart Surgery;;  . INTRAOPERATIVE TRANSESOPHAGEAL ECHOCARDIOGRAM N/A 10/29/2013   Procedure: INTRAOPERATIVE TRANSESOPHAGEAL ECHOCARDIOGRAM;  Surgeon: Rexene Alberts, MD;  Location: [place];  Service: Open Heart Surgery;  Laterality: N/A;  . LEFT AND RIGHT HEART CATHETERIZATION WITH CORONARY ANGIOGRAM N/A 06/27/2013   Procedure: LEFT AND RIGHT HEART CATHETERIZATION WITH CORONARY ANGIOGRAM;  Surgeon: Jettie Booze, MD;  Location: [facility] CATH LAB;  Service: Cardiovascular;  Laterality: N/A;  . MEDIAL PARTIAL KNEE REPLACEMENT Left 1990's?  Marland Kitchen MINIMALLY INVASIVE MAZE PROCEDURE N/A 10/29/2013   Procedure: MINIMALLY INVASIVE MAZE PROCEDURE;  Surgeon: Rexene Alberts, MD;  Location: [place];  Service: Open Heart Surgery;  Laterality: N/A;  . TEE WITHOUT CARDIOVERSION N/A 06/30/2013   Procedure: TRANSESOPHAGEAL ECHOCARDIOGRAM (TEE) ;  Surgeon: [name], MD;  Location:  [place];  Service: Cardiovascular;  Laterality: N/A;  . TRANSURETHRAL RESECTION OF PROSTATE  2010    Social History   Socioeconomic History  . Marital status: Widowed    Spouse name: Not on file  . Number of children: Not on file  . Years of education: Not on file  . Highest education level: Not on file  Occupational History  . Not on file  Tobacco Use  . Smoking status: Never Smoker  . Smokeless tobacco: Never Used  . Tobacco comment: 06/25/2013 "smoked very light; years and years ago"  Substance and Sexual Activity  . Alcohol use: Yes    Alcohol/week: 14.0 standard drinks    Types: 7 Glasses of wine, 7 Shots of liquor per week  . Drug use: No  . Sexual activity: Not Currently  Other Topics Concern  . Not on file  Social History Narrative   He lives in [place]. He is widowed. Wife passed of breast cancer last  year. No children.    Social Determinants of Health   Financial Resource Strain:   . Difficulty of Paying Living Expenses: Not on file  Food Insecurity:   . Worried About Charity fundraiser in the Last Year: Not on file  . Ran Out of Food in the Last Year: Not on file  Transportation Needs:   . Lack of Transportation (Medical): Not on file  . Lack of Transportation (Non-Medical): Not on file  Physical Activity:   . Days of Exercise per Week: Not on file  . Minutes of Exercise per Session: Not on file  Stress:   . Feeling of Stress : Not on file  Social Connections:   . Frequency of Communication with Friends and Family: Not on file  . Frequency of Social Gatherings with Friends and Family: Not on file  . Attends Religious Services: Not on file  . Active Member of Clubs or Organizations: Not on file  . Attends Archivist Meetings: Not on file  . Marital Status: Not on file  Intimate Partner Violence:   . Fear of Current or Ex-Partner: Not on file  . Emotionally Abused: Not on file  . Physically Abused: Not on file  . Sexually Abused: Not  on file    Family History  Problem Relation Age of Onset  . Healthy Father   . Healthy Mother   . Healthy Sister     ROS: no fevers or chills, productive cough, hemoptysis, dysphasia, odynophagia, melena, hematochezia, dysuria, hematuria, rash, seizure activity, orthopnea, PND, pedal edema, claudication. Remaining systems are negative.  Physical Exam: Well-developed obese in no acute distress.  Skin is warm and dry.  HEENT is normal.  Neck is supple.  Chest is clear to auscultation with normal expansion.  Cardiovascular exam is regular rate and rhythm.  Abdominal exam nontender or distended. No masses palpated. Extremities show no edema. neuro grossly intact  ECG-sinus rhythm, right axis deviation personally reviewed  A/P  1 PAF-He is in sinus rhythm on exam today. Continue amiodarone. Check TSH, liver functions and chest x-ray. Continue apixaban at present dose. Check hemoglobin and renal function.  2 hypertension-blood pressure elevated; increase amlodipine to 10 mg daily.  3 hyperlipidemia-continue statin.  4 chronic diastolic congestive heart failure-volume status is stable. Continue Lasix at present dose.  5 history of nonischemic cardiomyopathy-improved on most recent echocardiogram. Continue beta-blocker and ACE inhibitor.  6 penetrating atherosclerotic aortic ulcer-patient has previously been evaluated by Dr. Roxy Manns and no further imaging was recommended.  7 coronary artery disease-continue statin.  No aspirin given need for anticoagulation.  Kirk Ruths, MD

## 2019-05-07 ENCOUNTER — Other Ambulatory Visit: Payer: Self-pay

## 2019-05-07 ENCOUNTER — Encounter: Payer: Self-pay | Admitting: Cardiology

## 2019-05-07 ENCOUNTER — Ambulatory Visit (HOSPITAL_BASED_OUTPATIENT_CLINIC_OR_DEPARTMENT_OTHER)
Admission: RE | Admit: 2019-05-07 | Discharge: 2019-05-07 | Disposition: A | Discharge status: Home / Self Care | Payer: Medicare Other | Source: Ambulatory Visit | Attending: Cardiology | Admitting: Cardiology

## 2019-05-07 ENCOUNTER — Ambulatory Visit (INDEPENDENT_AMBULATORY_CARE_PROVIDER_SITE_OTHER): Payer: Medicare Other | Admitting: Cardiology

## 2019-05-07 VITALS — BP 148/76 | HR 64 | Ht 74.0 in | Wt 267.8 lb

## 2019-05-07 DIAGNOSIS — I480 Paroxysmal atrial fibrillation: Secondary | ICD-10-CM | POA: Insufficient documentation

## 2019-05-07 DIAGNOSIS — I2510 Atherosclerotic heart disease of native coronary artery without angina pectoris: Secondary | ICD-10-CM

## 2019-05-07 DIAGNOSIS — I719 Aortic aneurysm of unspecified site, without rupture: Secondary | ICD-10-CM

## 2019-05-07 DIAGNOSIS — I10 Essential (primary) hypertension: Secondary | ICD-10-CM

## 2019-05-07 DIAGNOSIS — I700 Atherosclerosis of aorta: Secondary | ICD-10-CM | POA: Diagnosis not present

## 2019-05-07 MED ORDER — AMLODIPINE BESYLATE 10 MG PO TABS: 10.0000 mg | ORAL_TABLET | Freq: Every day | ORAL | 3 refills | Status: DC

## 2019-05-07 NOTE — Patient Instructions (Signed)
Medication Instructions:  INCREASE AMLODIPINE TO 10 MG ONCE DAILY= 2 OF THE 5 MG TABLETS ONCE DAILY  *If you need a refill on your cardiac medications before your next appointment, please call your pharmacy*   Lab Work: Your physician recommends that you HAVE LAB WORK TODAY  If you have labs (blood work) drawn today and your tests are completely normal, you will receive your results only by: Marland Kitchen MyChart Message (if you have MyChart) OR . A paper copy in the mail If you have any lab test that is abnormal or we need to change your treatment, we will call you to review the results.   Testing/Procedures: A chest x-ray takes a picture of the organs and structures inside the chest, including the heart, lungs, and blood vessels. This test can show several things, including, whether the heart is enlarges; whether fluid is building up in the lungs; and whether pacemaker / defibrillator leads are still in place. [place] [facility]   Follow-Up: At [facility], you and your health needs are our priority.  As part of our continuing mission to provide you with exceptional heart care, we have created designated Provider Care Teams.  These Care Teams include your primary Cardiologist (physician) and Advanced Practice Providers (APPs -  Physician Assistants and Nurse Practitioners) who all work together to provide you with the care you need, when you need it.  We recommend signing up for the patient portal called "MyChart".  Sign up information is provided on this After Visit Summary.  MyChart is used to connect with patients for Virtual Visits (Telemedicine).  Patients are able to view lab/test results, encounter notes, upcoming appointments, etc.  Non-urgent messages can be sent to your provider as well.   To learn more about what you can do with MyChart, go to [email or web address].    Your next appointment:   6 month(s)  The format for your next appointment:   Either In Person or  Virtual  Provider:   Kirk Ruths, MD

## 2019-05-08 LAB — COMPREHENSIVE METABOLIC PANEL
ALT: 34 IU/L (ref 0–44)
AST: 38 IU/L (ref 0–40)
Albumin/Globulin Ratio: 1.7 (ref 1.2–2.2)
Albumin: 4.4 g/dL (ref 3.7–4.7)
Alkaline Phosphatase: 106 IU/L (ref 39–117)
BUN/Creatinine Ratio: 10 (ref 10–24)
BUN: 18 mg/dL (ref 8–27)
Bilirubin Total: 0.6 mg/dL (ref 0.0–1.2)
CO2: 24 mmol/L (ref 20–29)
Calcium: 9.4 mg/dL (ref 8.6–10.2)
Chloride: 98 mmol/L (ref 96–106)
Creatinine, Ser: 1.76 mg/dL — ABNORMAL HIGH (ref 0.76–1.27)
GFR calc Af Amer: 42 mL/min/{1.73_m2} — ABNORMAL LOW (ref >59)
GFR calc non Af Amer: 36 mL/min/{1.73_m2} — ABNORMAL LOW (ref >59)
Globulin, Total: 2.6 g/dL (ref 1.5–4.5)
Glucose: 99 mg/dL (ref 65–99)
Potassium: 5.2 mmol/L (ref 3.5–5.2)
Sodium: 139 mmol/L (ref 134–144)
Total Protein: 7 g/dL (ref 6.0–8.5)

## 2019-05-08 LAB — CBC
Hematocrit: 39.7 % (ref 37.5–51.0)
Hemoglobin: 13.3 g/dL (ref 13.0–17.7)
MCH: 31.9 pg (ref 26.6–33.0)
MCHC: 33.5 g/dL (ref 31.5–35.7)
MCV: 95 fL (ref 79–97)
Platelets: 204 10*3/uL (ref 150–450)
RBC: 4.17 x10E6/uL (ref 4.14–5.80)
RDW: 12.4 % (ref 11.6–15.4)
WBC: 5.6 10*3/uL (ref 3.4–10.8)

## 2019-05-08 LAB — TSH: TSH: 3.11 u[IU]/mL (ref 0.450–4.500)

## 2019-06-25 ENCOUNTER — Encounter: Payer: Self-pay | Admitting: Podiatry

## 2019-06-25 ENCOUNTER — Ambulatory Visit (INDEPENDENT_AMBULATORY_CARE_PROVIDER_SITE_OTHER): Payer: Medicare Other | Admitting: Podiatry

## 2019-06-25 ENCOUNTER — Other Ambulatory Visit: Payer: Self-pay

## 2019-06-25 VITALS — Temp 98.0°F

## 2019-06-25 DIAGNOSIS — M79676 Pain in unspecified toe(s): Secondary | ICD-10-CM | POA: Diagnosis not present

## 2019-06-25 DIAGNOSIS — B351 Tinea unguium: Secondary | ICD-10-CM

## 2019-06-25 DIAGNOSIS — D689 Coagulation defect, unspecified: Secondary | ICD-10-CM

## 2019-06-25 NOTE — Progress Notes (Signed)
This patient returns to my office for at risk foot care.  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  This patient is unable to cut nails himself since the patient cannot reach his nails.These nails are painful walking and wearing shoes.  This patient presents for at risk foot care today.  General Appearance  Alert, conversant and in no acute stress.  Vascular  Dorsalis pedis and posterior tibial  pulses are palpable  bilaterally.  Capillary return is within normal limits  bilaterally. Temperature is within normal limits  bilaterally.  Neurologic  Senn-Weinstein monofilament wire test within normal limits  bilaterally. Muscle power within normal limits bilaterally.  Nails Thick disfigured discolored nails with subungual debris  from hallux to fifth toes bilaterally. No evidence of bacterial infection or drainage bilaterally.   Orthopedic  No limitations of motion  feet .  No crepitus or effusions noted.  No bony pathology or digital deformities noted.  Skin  normotropic skin with no porokeratosis noted bilaterally.  No signs of infections or ulcers noted.     Onychomycosis  Pain in right toes  Pain in left toes  Consent was obtained for treatment procedures.   Mechanical debridement of nails 1-5  bilaterally performed with a nail nipper.  Filed with dremel without incident.    Return office visit   3 months                   Told patient to return for periodic foot care and evaluation due to potential at risk complications.   Oliwia Berzins DPM  

## 2019-07-22 ENCOUNTER — Other Ambulatory Visit: Payer: Self-pay | Admitting: Cardiology

## 2019-07-22 DIAGNOSIS — I480 Paroxysmal atrial fibrillation: Secondary | ICD-10-CM

## 2019-09-02 ENCOUNTER — Other Ambulatory Visit: Payer: Self-pay | Admitting: Cardiology

## 2019-09-02 DIAGNOSIS — I480 Paroxysmal atrial fibrillation: Secondary | ICD-10-CM

## 2019-09-26 ENCOUNTER — Other Ambulatory Visit: Payer: Self-pay

## 2019-09-26 ENCOUNTER — Encounter: Payer: Self-pay | Admitting: Podiatry

## 2019-09-26 ENCOUNTER — Ambulatory Visit (INDEPENDENT_AMBULATORY_CARE_PROVIDER_SITE_OTHER): Payer: Medicare Other | Admitting: Podiatry

## 2019-09-26 DIAGNOSIS — D689 Coagulation defect, unspecified: Secondary | ICD-10-CM

## 2019-09-26 DIAGNOSIS — M79676 Pain in unspecified toe(s): Secondary | ICD-10-CM

## 2019-09-26 DIAGNOSIS — B351 Tinea unguium: Secondary | ICD-10-CM

## 2019-09-26 NOTE — Progress Notes (Signed)
This patient returns to my office for at risk foot care.  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  This patient is unable to cut nails himself since the patient cannot reach his nails.These nails are painful walking and wearing shoes.  This patient presents for at risk foot care today.  General Appearance  Alert, conversant and in no acute stress.  Vascular  Dorsalis pedis and posterior tibial  pulses are palpable  bilaterally.  Capillary return is within normal limits  bilaterally. Temperature is within normal limits  bilaterally.  Neurologic  Senn-Weinstein monofilament wire test within normal limits  bilaterally. Muscle power within normal limits bilaterally.  Nails Thick disfigured discolored nails with subungual debris  from hallux to fifth toes bilaterally. No evidence of bacterial infection or drainage bilaterally.  Orthopedic  No limitations of motion  feet .  No crepitus or effusions noted.  No bony pathology or digital deformities noted.  Skin  normotropic skin with no porokeratosis noted bilaterally.  No signs of infections or ulcers noted.     Onychomycosis  Pain in right toes  Pain in left toes  Consent was obtained for treatment procedures.   Mechanical debridement of nails 1-5  bilaterally performed with a nail nipper.  Filed with dremel without incident.  Self avulsed 1,4 toenail right foot with no evidence of infection.   Return office visit   3 months                   Told patient to return for periodic foot care and evaluation due to potential at risk complications.   Lodema Parma DPM  

## 2019-12-31 ENCOUNTER — Other Ambulatory Visit: Payer: Self-pay

## 2019-12-31 ENCOUNTER — Encounter: Payer: Self-pay | Admitting: Podiatry

## 2019-12-31 ENCOUNTER — Ambulatory Visit (INDEPENDENT_AMBULATORY_CARE_PROVIDER_SITE_OTHER): Payer: Medicare Other | Admitting: Podiatry

## 2019-12-31 DIAGNOSIS — B351 Tinea unguium: Secondary | ICD-10-CM

## 2019-12-31 DIAGNOSIS — M79676 Pain in unspecified toe(s): Secondary | ICD-10-CM

## 2019-12-31 DIAGNOSIS — D689 Coagulation defect, unspecified: Secondary | ICD-10-CM

## 2019-12-31 NOTE — Progress Notes (Signed)
This patient returns to my office for at risk foot care.  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  This patient is unable to cut nails himself since the patient cannot reach his nails.These nails are painful walking and wearing shoes.  This patient presents for at risk foot care today.  General Appearance  Alert, conversant and in no acute stress.  Vascular  Dorsalis pedis and posterior tibial  pulses are palpable  bilaterally.  Capillary return is within normal limits  bilaterally. Temperature is within normal limits  bilaterally.  Neurologic  Senn-Weinstein monofilament wire test within normal limits  bilaterally. Muscle power within normal limits bilaterally.  Nails Thick disfigured discolored nails with subungual debris  from hallux to fifth toes bilaterally. No evidence of bacterial infection or drainage bilaterally.  Orthopedic  No limitations of motion  feet .  No crepitus or effusions noted.  No bony pathology or digital deformities noted.  Skin  normotropic skin with no porokeratosis noted bilaterally.  No signs of infections or ulcers noted.     Onychomycosis  Pain in right toes  Pain in left toes  Consent was obtained for treatment procedures.   Mechanical debridement of nails 1-5  bilaterally performed with a nail nipper.  Filed with dremel without incident.  Self avulsed 1,4 toenail right foot with no evidence of infection.   Return office visit   3 months                   Told patient to return for periodic foot care and evaluation due to potential at risk complications.   [name] DPM

## 2020-01-08 ENCOUNTER — Ambulatory Visit: Payer: Self-pay

## 2020-01-08 ENCOUNTER — Encounter: Payer: Self-pay | Admitting: Family Medicine

## 2020-01-08 ENCOUNTER — Ambulatory Visit (INDEPENDENT_AMBULATORY_CARE_PROVIDER_SITE_OTHER): Payer: Medicare Other | Admitting: Family Medicine

## 2020-01-08 ENCOUNTER — Other Ambulatory Visit: Payer: Self-pay

## 2020-01-08 VITALS — BP 131/80 | HR 61 | Ht 74.0 in | Wt 265.0 lb

## 2020-01-08 DIAGNOSIS — M25561 Pain in right knee: Secondary | ICD-10-CM

## 2020-01-08 DIAGNOSIS — M23203 Derangement of unspecified medial meniscus due to old tear or injury, right knee: Secondary | ICD-10-CM | POA: Insufficient documentation

## 2020-01-08 DIAGNOSIS — M179 Osteoarthritis of knee, unspecified: Secondary | ICD-10-CM | POA: Insufficient documentation

## 2020-01-08 MED ORDER — TRIAMCINOLONE ACETONIDE 40 MG/ML IJ SUSP
40.0000 mg | Freq: Once | INTRAMUSCULAR | Status: AC
  Administered 2020-01-08: 40 mg via INTRA_ARTICULAR

## 2020-01-08 NOTE — Patient Instructions (Signed)
Nice to meet you Please try ice  Please try the exercises  Please try the brace and walker   Please send me a message in MyChart with any questions or updates.  Please see me back in 4 weeks.   --Dr. Raeford Razor

## 2020-01-08 NOTE — Assessment & Plan Note (Signed)
Symptoms like he did have a twisting mechanism that instigated his pain.  Had a red-tinged aspiration.  Likely having meniscal irritation with no severe degenerative changes appreciated. -Counseled on home exercise therapy and supportive care. -Aspiration and injection. -Brace. -Rolling walker. -Could consider imaging or physical therapy.

## 2020-01-08 NOTE — Progress Notes (Signed)
Jose Snyder - 79 y.o. male MRN [id number]  Date of birth: November 04, 1940  SUBJECTIVE:  Including CC & ROS.  Chief Complaint  Patient presents with  . Knee Pain    right x 01-05-2020    Jose Snyder is a 79 y.o. male that is presenting with right knee pain.  Has been ongoing over the past few weeks.  It is occurring over the medial joint space.  No history of surgery.  He felt the pain after a twisting motion getting off of his riding lawnmower.  Since that time he has had pain with going up and down stairs.  Has pain over the medial joint space.  No prior history of surgery.   Review of Systems See HPI   HISTORY: Past Medical, Surgical, Social, and Family History Reviewed & Updated per EMR.   Pertinent Historical Findings include:  Past Medical History:  Diagnosis Date  . Anxiety   . Arthritis    R hand- OA  . Atrial Fibrillation  06/25/2013  . CAD (coronary artery disease), native coronary artery 06/28/2013   a. LHC (06/27/13):  Mid LAD 40%, mid D1 75%, mid CFX 50-60%, mid RCA 50%, EF 20%, LVEDP 24 mmHg.  . CHF (congestive heart failure) ([place])   . Chronic combined systolic and diastolic heart failure ([place]) 06/26/2013   a.  Echo (06/25/13):  EF 20-25%, diff HK, restrictive physio, mild MR, mod to severe LAE, mild RVE, mildly reduced RVSF, mod to severe RAE  . Chronic kidney disease   . Dyslipidemia 07/15/2013  . Dysrhythmia    a-fib  . GERD (gastroesophageal reflux disease)   . Hypertension   . Incidental pulmonary nodule 06/22/2014   Seen on CT angiogram of chest  . NICM (nonischemic cardiomyopathy) with EF 20-25% 06/26/2013  . Obesity   . Penetrating atherosclerotic ulcer of aorta ([place]) 10/22/2013   Small penetrating ulcer of descending thoracic aorta discovered on routine CTA  . Prostate cancer ([place])    watchful waiting, no treatment so far  . S/P Maze operation for atrial fibrillation 10/29/2013   Complete bilateral atrial lesion set using cryothermy and bipolar radiofrequency ablation  with clipping of LA appendage via right mini thoracotomy  . Shortness of breath    seen by Stanford Breed, & low energy     Past Surgical History:  Procedure Laterality Date  . CARDIAC CATHETERIZATION  06/2013  . CARDIOVERSION N/A 06/30/2013   Procedure: CARDIOVERSION;  Surgeon: [name], MD;  Location: [place];  Service: Cardiovascular;  Laterality: N/A;  . CARDIOVERSION N/A 07/31/2013   Procedure: CARDIOVERSION;  Surgeon: Thayer Headings, MD;  Location: [place];  Service: Cardiovascular;  Laterality: N/A;  . CARDIOVERSION N/A 03/19/2018   Procedure: CARDIOVERSION;  Surgeon: [name], MD;  Location: [facility] ENDOSCOPY;  Service: Cardiovascular;  Laterality: N/A;  . CARDIOVERSION N/A 07/24/2018   Procedure: CARDIOVERSION;  Surgeon: [name], MD;  Location: [facility] ENDOSCOPY;  Service: Cardiovascular;  Laterality: N/A;  . CLIPPING OF ATRIAL APPENDAGE  10/29/2013   Procedure: CLIPPING OF ATRIAL APPENDAGE;  Surgeon: Rexene Alberts, MD;  Location: [place];  Service: Open Heart Surgery;;  . INTRAOPERATIVE TRANSESOPHAGEAL ECHOCARDIOGRAM N/A 10/29/2013   Procedure: INTRAOPERATIVE TRANSESOPHAGEAL ECHOCARDIOGRAM;  Surgeon: Rexene Alberts, MD;  Location: [place];  Service: Open Heart Surgery;  Laterality: N/A;  . LEFT AND RIGHT HEART CATHETERIZATION WITH CORONARY ANGIOGRAM N/A 06/27/2013   Procedure: LEFT AND RIGHT HEART CATHETERIZATION WITH CORONARY ANGIOGRAM;  Surgeon: Jettie Booze, MD;  Location:  [place] CATH LAB;  Service: Cardiovascular;  Laterality: N/A;  . MEDIAL PARTIAL KNEE REPLACEMENT Left 1990's?  Marland Kitchen MINIMALLY INVASIVE MAZE PROCEDURE N/A 10/29/2013   Procedure: MINIMALLY INVASIVE MAZE PROCEDURE;  Surgeon: Rexene Alberts, MD;  Location: [place];  Service: Open Heart Surgery;  Laterality: N/A;  . TEE WITHOUT CARDIOVERSION N/A 06/30/2013   Procedure: TRANSESOPHAGEAL ECHOCARDIOGRAM (TEE) ;  Surgeon: [name], MD;  Location: [place];  Service: Cardiovascular;  Laterality: N/A;  .  TRANSURETHRAL RESECTION OF PROSTATE  2010    Family History  Problem Relation Age of Onset  . Healthy Father   . Healthy Mother   . Healthy Sister     Social History   Socioeconomic History  . Marital status: Widowed    Spouse name: Not on file  . Number of children: Not on file  . Years of education: Not on file  . Highest education level: Not on file  Occupational History  . Not on file  Tobacco Use  . Smoking status: Never Smoker  . Smokeless tobacco: Never Used  . Tobacco comment: 06/25/2013 "smoked very light; years and years ago"  Vaping Use  . Vaping Use: Never used  Substance and Sexual Activity  . Alcohol use: Yes    Alcohol/week: 14.0 standard drinks    Types: 7 Glasses of wine, 7 Shots of liquor per week  . Drug use: No  . Sexual activity: Not Currently  Other Topics Concern  . Not on file  Social History Narrative   He lives in [place]. He is widowed. Wife passed of breast cancer last year. No children.    Social Determinants of Health   Financial Resource Strain:   . Difficulty of Paying Living Expenses: Not on file  Food Insecurity:   . Worried About Charity fundraiser in the Last Year: Not on file  . Ran Out of Food in the Last Year: Not on file  Transportation Needs:   . Lack of Transportation (Medical): Not on file  . Lack of Transportation (Non-Medical): Not on file  Physical Activity:   . Days of Exercise per Week: Not on file  . Minutes of Exercise per Session: Not on file  Stress:   . Feeling of Stress : Not on file  Social Connections:   . Frequency of Communication with Friends and Family: Not on file  . Frequency of Social Gatherings with Friends and Family: Not on file  . Attends Religious Services: Not on file  . Active Member of Clubs or Organizations: Not on file  . Attends Archivist Meetings: Not on file  . Marital Status: Not on file  Intimate Partner Violence:   . Fear of Current or Ex-Partner: Not on file  .  Emotionally Abused: Not on file  . Physically Abused: Not on file  . Sexually Abused: Not on file     PHYSICAL EXAM:  VS: BP 131/80   Pulse 61   Ht 6\' 2"  (1.88 m)   Wt 265 lb (120.2 kg)   BMI 34.02 kg/m  Physical Exam Gen: NAD, alert, cooperative with exam, well-appearing MSK:  Right knee: Effusion noted. Normal range of motion. Tenderness palpation of the medial joint space. Positive Murray's test. Neurovascularly intact  Limited ultrasound: Right knee:  Moderate effusion. Normal-appearing quadricep and patellar tendon. Normal-appearing joint space and medial compartment.  Degenerative changes appreciated the medial meniscus.   Hypoechoic changes appreciated of the origin of the MCL.   There is  hyperemia over the medial femoral condyle. Normal-appearing lateral joint space and meniscus  Summary: Findings are revealing for degenerative changes of the meniscus medially with an effusion.  Ultrasound and interpretation by Clearance Coots, MD   Aspiration/Injection Procedure Note Ashish Rochefort [birth year]  Procedure: Aspiration and Injection Indications: Right knee pain  Procedure Details Consent: Risks of procedure as well as the alternatives and risks of each were explained to the (patient/caregiver).  Consent for procedure obtained. Time Out: Verified patient identification, verified procedure, site/side was marked, verified correct patient position, special equipment/implants available, medications/allergies/relevent history reviewed, required imaging and test results available.  Performed.  The area was cleaned with iodine and alcohol swabs.    The Right knee superior lateral suprapatellar pouch was injected using 3 cc of 1% lidocaine on a 21-gauge 2 inch needle.  An 18-gauge 1-1/2 inch needle was used to achieve aspiration.  The syringe was switched and a mixture containing 1 cc's of 40 mg Kenalog and 4 cc's of 0.25% bupivacaine was injected.  Ultrasound was used. Images  were obtained in long views showing the injection.    Amount of Fluid Aspirated: 3mL Character of Fluid: clear and red colored Fluid was sent for:n/a  A sterile dressing was applied.  Patient did tolerate procedure well.    ASSESSMENT & PLAN:   Degenerative tear of medial meniscus of right knee Symptoms like he did have a twisting mechanism that instigated his pain.  Had a red-tinged aspiration.  Likely having meniscal irritation with no severe degenerative changes appreciated. -Counseled on home exercise therapy and supportive care. -Aspiration and injection. -Brace. -Rolling walker. -Could consider imaging or physical therapy.

## 2020-01-09 ENCOUNTER — Telehealth: Payer: Self-pay | Admitting: Family Medicine

## 2020-01-09 NOTE — Telephone Encounter (Signed)
Patient called states is doing much better today--He says will continue to do exercises instructed by provider but ask that we cancel the order for the (walker) says he does Not want it.  Wishes to express gratitude to [place] for his wonderful attention & care yesterday 01/07/20 & will see him @ appt scheduled in December.  --Forwarding message to provider.  -glh

## 2020-01-20 IMAGING — DX DG CHEST 2V
2 series · 2 of 2 positions shown · non-contrast
Comparison: 11/12/2013

CLINICAL DATA: Atrial fibrillation, high risk medication

EXAM:
CHEST - 2 VIEW

[chest pa]
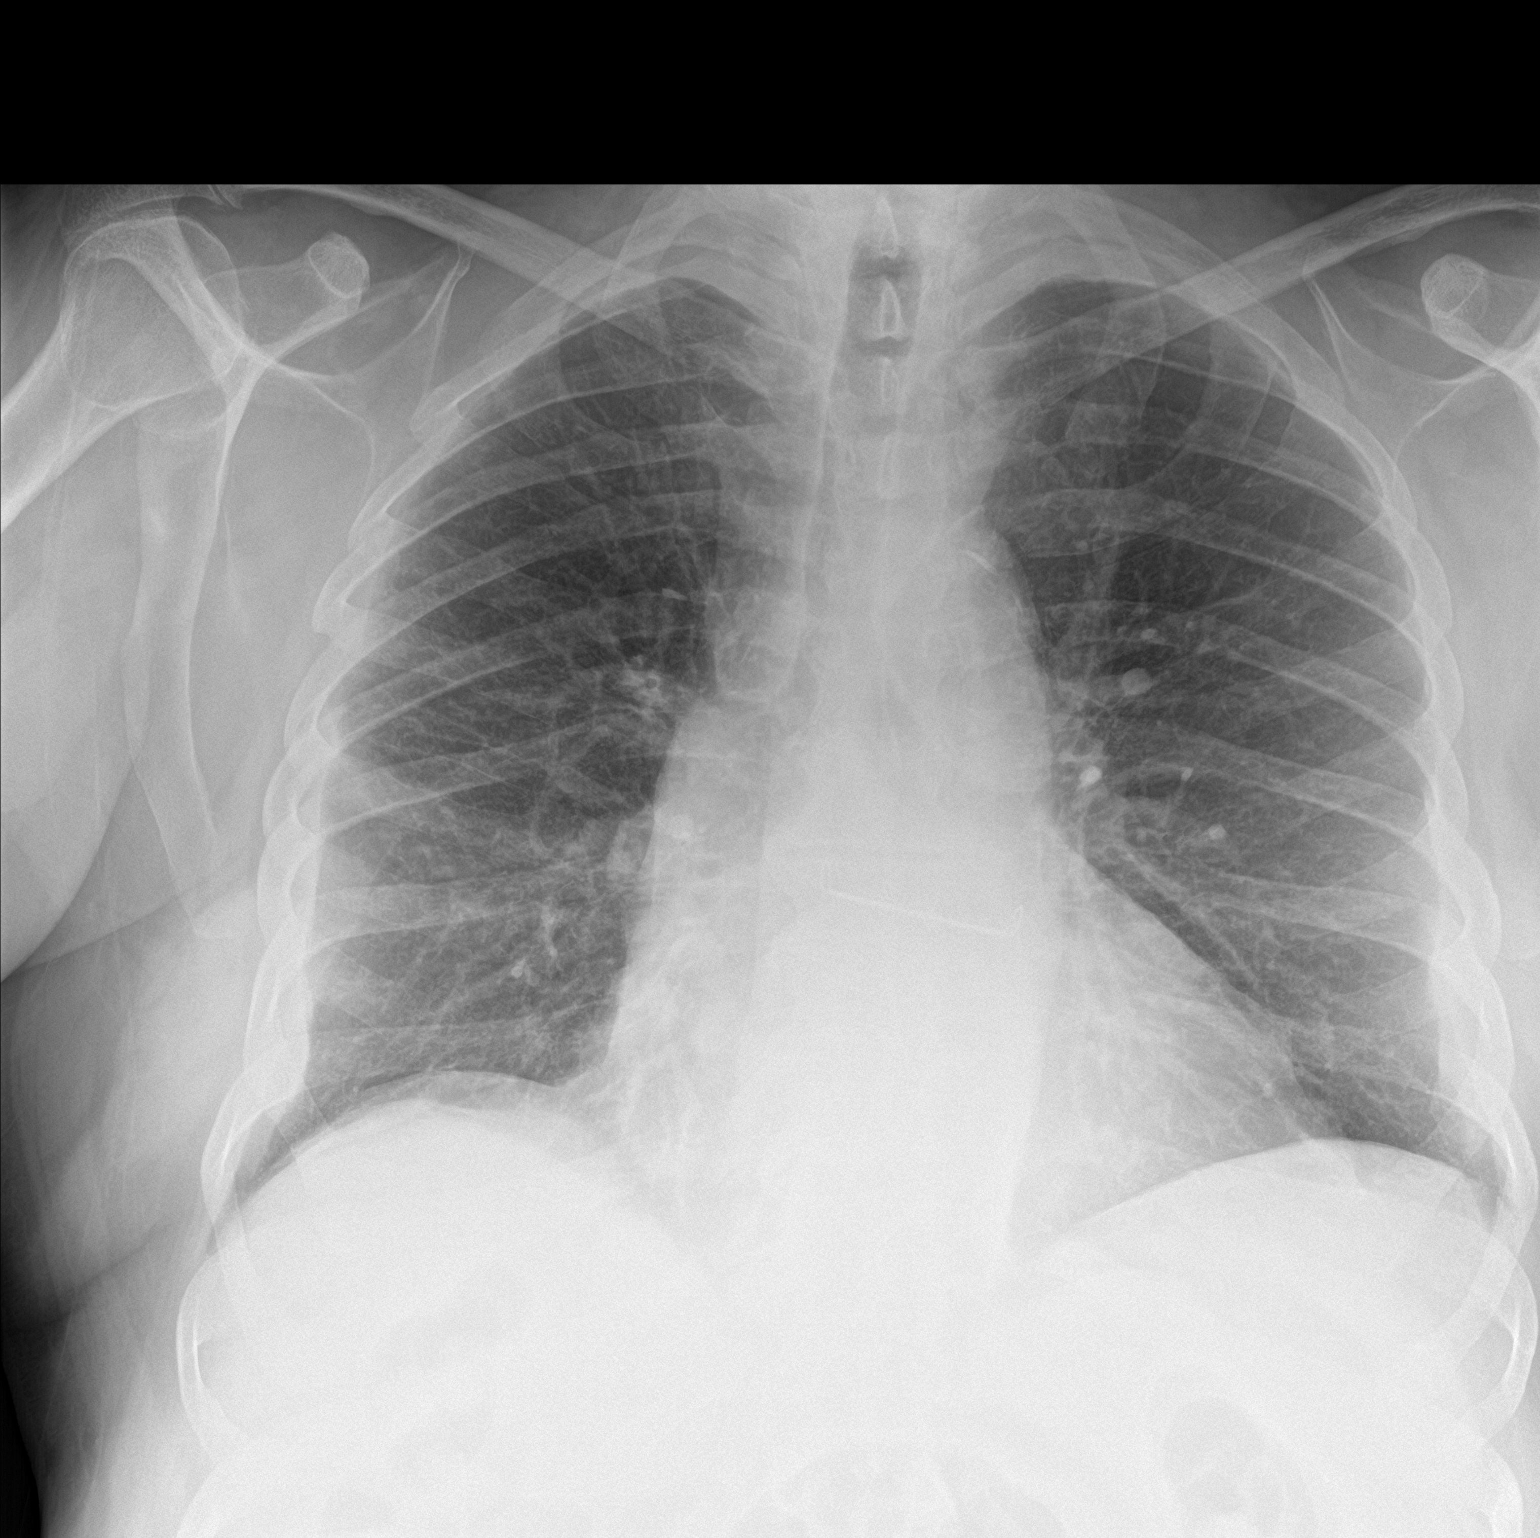

[chest lat]
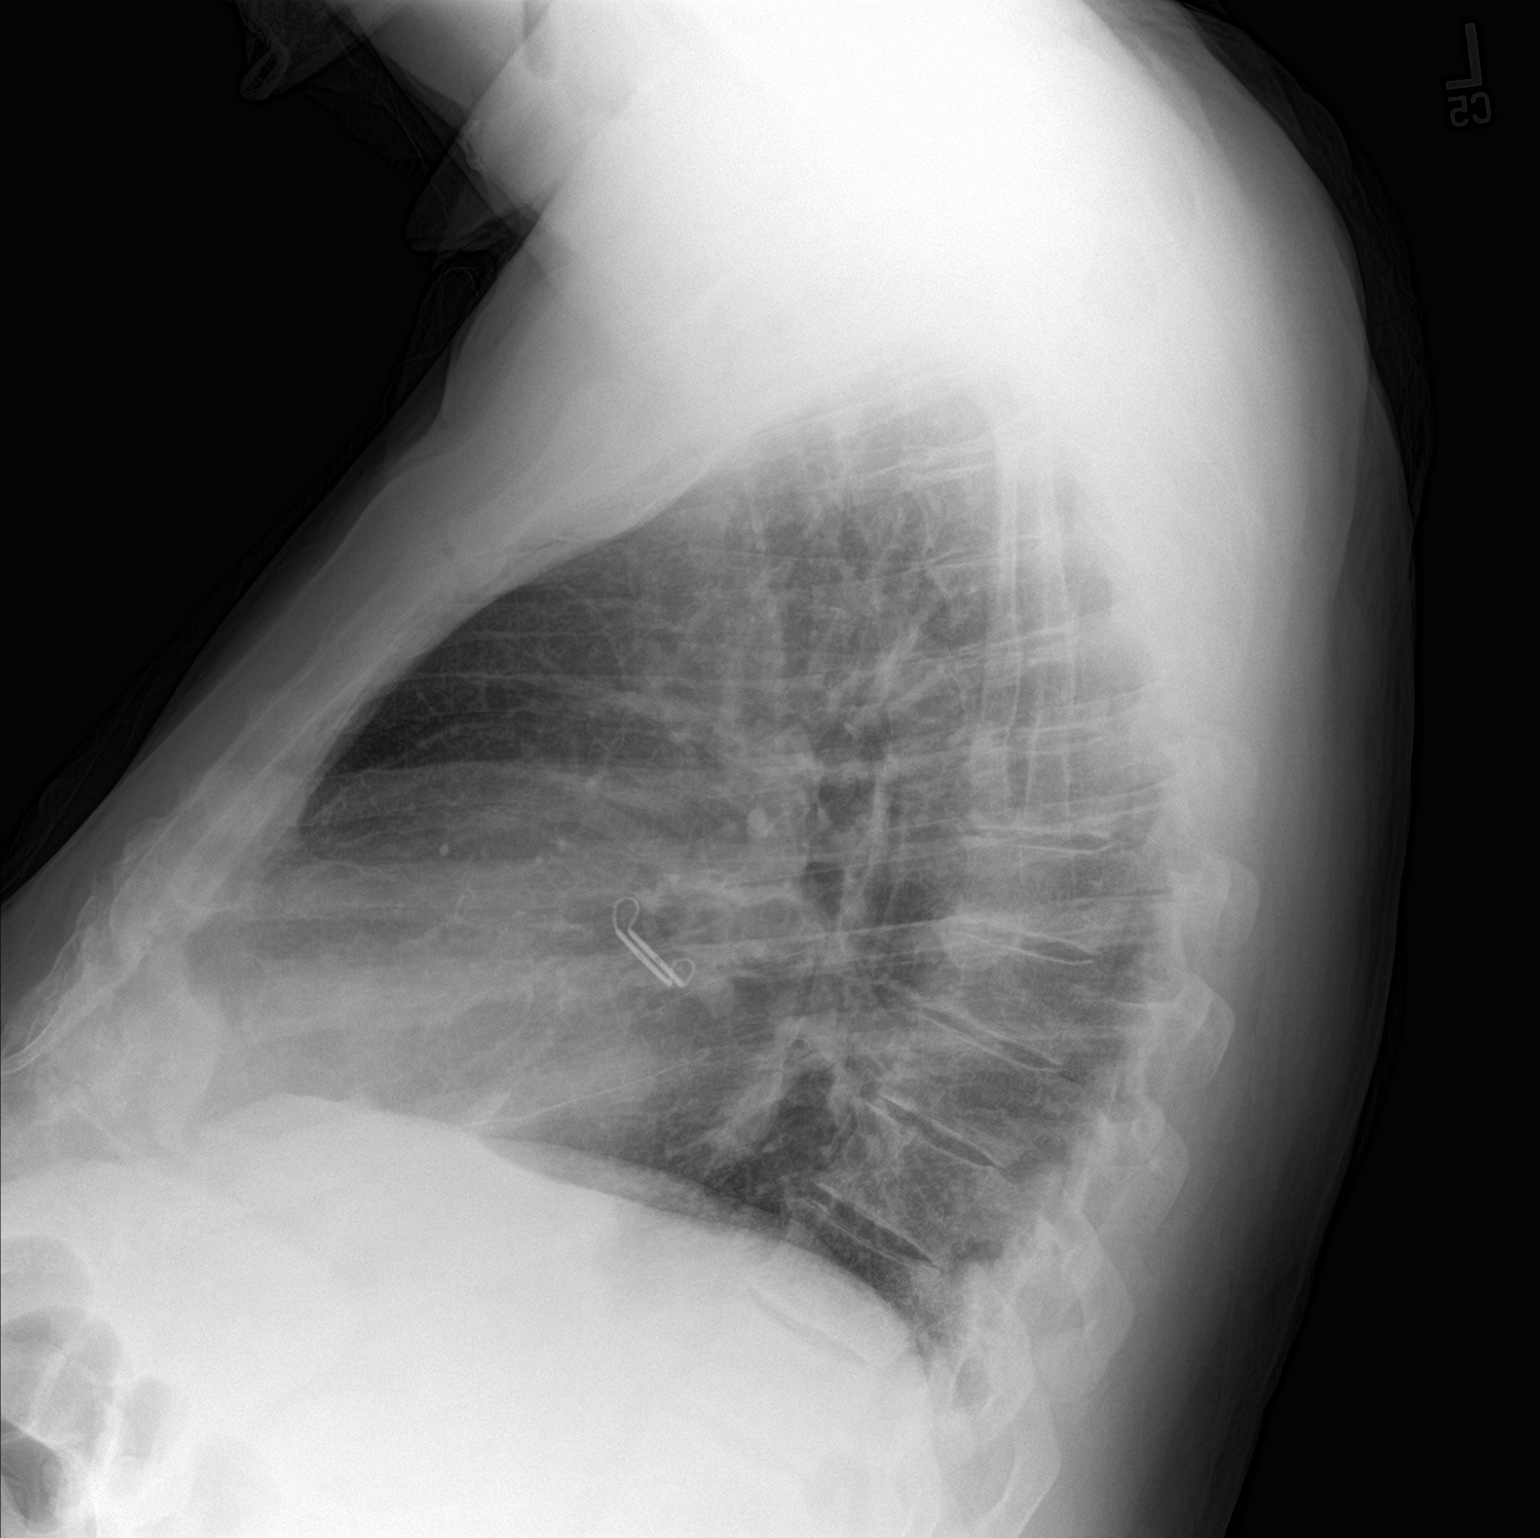

[2 of 2 positions shown; findings below may reference images not displayed]

FINDINGS: Heart is upper limits normal in size. Bilateral pleural thickening
at the lung bases. No confluent airspace opacities, effusions or
acute bony abnormality.
IMPRESSION: No active cardiopulmonary disease.

## 2020-02-12 ENCOUNTER — Ambulatory Visit (INDEPENDENT_AMBULATORY_CARE_PROVIDER_SITE_OTHER): Payer: Medicare Other | Admitting: Family Medicine

## 2020-02-12 ENCOUNTER — Encounter: Payer: Self-pay | Admitting: Family Medicine

## 2020-02-12 ENCOUNTER — Other Ambulatory Visit: Payer: Self-pay

## 2020-02-12 DIAGNOSIS — M1711 Unilateral primary osteoarthritis, right knee: Secondary | ICD-10-CM | POA: Diagnosis not present

## 2020-02-12 NOTE — Progress Notes (Signed)
Jose Snyder - 79 y.o. male MRN [id number]  Date of birth: 1940-12-26  SUBJECTIVE:  Including CC & ROS.  Chief Complaint  Patient presents with  . Follow-up    Right knee    Jose Snyder is a 79 y.o. male that is presenting with ongoing right knee pain.  He has been doing the home exercises.  He still gets intermittent pain more around the kneecap.   Review of Systems See HPI   HISTORY: Past Medical, Surgical, Social, and Family History Reviewed & Updated per EMR.   Pertinent Historical Findings include:  Past Medical History:  Diagnosis Date  . Anxiety   . Arthritis    R hand- OA  . Atrial Fibrillation  06/25/2013  . CAD (coronary artery disease), native coronary artery 06/28/2013   a. LHC (06/27/13):  Mid LAD 40%, mid D1 75%, mid CFX 50-60%, mid RCA 50%, EF 20%, LVEDP 24 mmHg.  . CHF (congestive heart failure) ([place])   . Chronic combined systolic and diastolic heart failure ([place]) 06/26/2013   a.  Echo (06/25/13):  EF 20-25%, diff HK, restrictive physio, mild MR, mod to severe LAE, mild RVE, mildly reduced RVSF, mod to severe RAE  . Chronic kidney disease   . Dyslipidemia 07/15/2013  . Dysrhythmia    a-fib  . GERD (gastroesophageal reflux disease)   . Hypertension   . Incidental pulmonary nodule 06/22/2014   Seen on CT angiogram of chest  . NICM (nonischemic cardiomyopathy) with EF 20-25% 06/26/2013  . Obesity   . Penetrating atherosclerotic ulcer of aorta ([place]) 10/22/2013   Small penetrating ulcer of descending thoracic aorta discovered on routine CTA  . Prostate cancer ([place])    watchful waiting, no treatment so far  . S/P Maze operation for atrial fibrillation 10/29/2013   Complete bilateral atrial lesion set using cryothermy and bipolar radiofrequency ablation with clipping of LA appendage via right mini thoracotomy  . Shortness of breath    seen by Stanford Breed, & low energy     Past Surgical History:  Procedure Laterality Date  . CARDIAC CATHETERIZATION  06/2013  . CARDIOVERSION  N/A 06/30/2013   Procedure: CARDIOVERSION;  Surgeon: [name], MD;  Location: [place];  Service: Cardiovascular;  Laterality: N/A;  . CARDIOVERSION N/A 07/31/2013   Procedure: CARDIOVERSION;  Surgeon: Thayer Headings, MD;  Location: [place];  Service: Cardiovascular;  Laterality: N/A;  . CARDIOVERSION N/A 03/19/2018   Procedure: CARDIOVERSION;  Surgeon: [name], MD;  Location: [facility] ENDOSCOPY;  Service: Cardiovascular;  Laterality: N/A;  . CARDIOVERSION N/A 07/24/2018   Procedure: CARDIOVERSION;  Surgeon: [name], MD;  Location: [facility] ENDOSCOPY;  Service: Cardiovascular;  Laterality: N/A;  . CLIPPING OF ATRIAL APPENDAGE  10/29/2013   Procedure: CLIPPING OF ATRIAL APPENDAGE;  Surgeon: Rexene Alberts, MD;  Location: [place];  Service: Open Heart Surgery;;  . INTRAOPERATIVE TRANSESOPHAGEAL ECHOCARDIOGRAM N/A 10/29/2013   Procedure: INTRAOPERATIVE TRANSESOPHAGEAL ECHOCARDIOGRAM;  Surgeon: Rexene Alberts, MD;  Location: [place];  Service: Open Heart Surgery;  Laterality: N/A;  . LEFT AND RIGHT HEART CATHETERIZATION WITH CORONARY ANGIOGRAM N/A 06/27/2013   Procedure: LEFT AND RIGHT HEART CATHETERIZATION WITH CORONARY ANGIOGRAM;  Surgeon: Jettie Booze, MD;  Location: [facility] CATH LAB;  Service: Cardiovascular;  Laterality: N/A;  . MEDIAL PARTIAL KNEE REPLACEMENT Left 1990's?  Marland Kitchen MINIMALLY INVASIVE MAZE PROCEDURE N/A 10/29/2013   Procedure: MINIMALLY INVASIVE MAZE PROCEDURE;  Surgeon: Rexene Alberts, MD;  Location: [place];  Service: Open Heart Surgery;  Laterality:  N/A;  . TEE WITHOUT CARDIOVERSION N/A 06/30/2013   Procedure: TRANSESOPHAGEAL ECHOCARDIOGRAM (TEE) ;  Surgeon: [name], MD;  Location: [place];  Service: Cardiovascular;  Laterality: N/A;  . TRANSURETHRAL RESECTION OF PROSTATE  2010    Family History  Problem Relation Age of Onset  . Healthy Father   . Healthy Mother   . Healthy Sister     Social History   Socioeconomic History  . Marital status: Widowed     Spouse name: Not on file  . Number of children: Not on file  . Years of education: Not on file  . Highest education level: Not on file  Occupational History  . Not on file  Tobacco Use  . Smoking status: Never Smoker  . Smokeless tobacco: Never Used  . Tobacco comment: 06/25/2013 "smoked very light; years and years ago"  Vaping Use  . Vaping Use: Never used  Substance and Sexual Activity  . Alcohol use: Yes    Alcohol/week: 14.0 standard drinks    Types: 7 Glasses of wine, 7 Shots of liquor per week  . Drug use: No  . Sexual activity: Not Currently  Other Topics Concern  . Not on file  Social History Narrative   He lives in [place]. He is widowed. Wife passed of breast cancer last year. No children.    Social Determinants of Health   Financial Resource Strain: Not on file  Food Insecurity: Not on file  Transportation Needs: Not on file  Physical Activity: Not on file  Stress: Not on file  Social Connections: Not on file  Intimate Partner Violence: Not on file     PHYSICAL EXAM:  VS: BP (!) 146/82   Pulse (!) 59   Ht 6\' 2"  (1.88 m)   Wt 265 lb (120.2 kg)   BMI 34.02 kg/m  Physical Exam Gen: NAD, alert, cooperative with exam, well-appearing MSK:  Right knee: No effusion today. Normal range of motion. Neurovascular intact     ASSESSMENT & PLAN:   OA (osteoarthritis) of knee Still having ongoing pain.  Effusion has improved and range of motion has improved. -Counseled on home exercise therapy and supportive care. -We will pursue gel injections. -Can consider imaging or physical therapy.

## 2020-02-12 NOTE — Assessment & Plan Note (Signed)
Still having ongoing pain.  Effusion has improved and range of motion has improved. -Counseled on home exercise therapy and supportive care. -We will pursue gel injections. -Can consider imaging or physical therapy.

## 2020-02-12 NOTE — Patient Instructions (Signed)
Good to see you Please use ice as needed  Please continue the exercises   Please send me a message in MyChart with any questions or updates.  We will call once the gel injection is in.   --Dr. Raeford Razor

## 2020-03-02 ENCOUNTER — Telehealth: Payer: Self-pay | Admitting: Family Medicine

## 2020-03-02 NOTE — Telephone Encounter (Signed)
Patient cld for update on gel injection status--advised we are still waiting on his Ins [employer] authorization-- forms were faxed to [employer] 02/16/20.  --glh

## 2020-03-05 ENCOUNTER — Ambulatory Visit: Payer: Medicare Other | Attending: Internal Medicine

## 2020-03-05 ENCOUNTER — Other Ambulatory Visit (HOSPITAL_BASED_OUTPATIENT_CLINIC_OR_DEPARTMENT_OTHER): Payer: Self-pay | Admitting: Internal Medicine

## 2020-03-05 ENCOUNTER — Ambulatory Visit (INDEPENDENT_AMBULATORY_CARE_PROVIDER_SITE_OTHER): Payer: Medicare Other | Admitting: Family Medicine

## 2020-03-05 ENCOUNTER — Other Ambulatory Visit: Payer: Self-pay

## 2020-03-05 ENCOUNTER — Ambulatory Visit: Payer: Self-pay

## 2020-03-05 DIAGNOSIS — M1711 Unilateral primary osteoarthritis, right knee: Secondary | ICD-10-CM

## 2020-03-05 DIAGNOSIS — Z23 Encounter for immunization: Secondary | ICD-10-CM

## 2020-03-05 NOTE — Progress Notes (Signed)
   Covid-19 Vaccination Clinic  Name:  Jose Snyder    MRN: [id number] DOB: [birth year]  03/05/2020  [name] was observed post Covid-19 immunization for 15 minutes without incident. He was provided with Vaccine Information Sheet and instruction to access the V-Safe system.   Mr. Meals was instructed to call 911 with any severe reactions post vaccine: Marland Kitchen Difficulty breathing  . Swelling of face and throat  . A fast heartbeat  . A bad rash all over body  . Dizziness and weakness   Immunizations Administered    Name Date Dose VIS Date Route   JANSSEN COVID-19 VACCINE 03/05/2020  9:39 AM 0.5 mL 12/17/2019 Intramuscular   Manufacturer: [employer]   Lot: [id number]   [place]: [phone]

## 2020-03-05 NOTE — Patient Instructions (Signed)
Good to see you Please try ice as needed   Please send me a message in MyChart with any questions or updates.  Please see me back in 4 weeks.   --Dr. Shylie Polo  

## 2020-03-05 NOTE — Assessment & Plan Note (Addendum)
Completed gel injection today.  -Counseled on home exercise therapy and supportive therapy -Could consider Zilretta - could consider PT.

## 2020-03-05 NOTE — Progress Notes (Signed)
Jose Snyder - 80 y.o. male MRN [id number]  Date of birth: 04-23-1940  SUBJECTIVE:  Including CC & ROS.  No chief complaint on file.   Jose Snyder is a 80 y.o. male that is here for a monovisc injection  Review of Systems See HPI   HISTORY: Past Medical, Surgical, Social, and Family History Reviewed & Updated per EMR.   Pertinent Historical Findings include:  Past Medical History:  Diagnosis Date  . Anxiety   . Arthritis    R hand- OA  . Atrial Fibrillation  06/25/2013  . CAD (coronary artery disease), native coronary artery 06/28/2013   a. LHC (06/27/13):  Mid LAD 40%, mid D1 75%, mid CFX 50-60%, mid RCA 50%, EF 20%, LVEDP 24 mmHg.  . CHF (congestive heart failure) ([place])   . Chronic combined systolic and diastolic heart failure ([place]) 06/26/2013   a.  Echo (06/25/13):  EF 20-25%, diff HK, restrictive physio, mild MR, mod to severe LAE, mild RVE, mildly reduced RVSF, mod to severe RAE  . Chronic kidney disease   . Dyslipidemia 07/15/2013  . Dysrhythmia    a-fib  . GERD (gastroesophageal reflux disease)   . Hypertension   . Incidental pulmonary nodule 06/22/2014   Seen on CT angiogram of chest  . NICM (nonischemic cardiomyopathy) with EF 20-25% 06/26/2013  . Obesity   . Penetrating atherosclerotic ulcer of aorta ([place]) 10/22/2013   Small penetrating ulcer of descending thoracic aorta discovered on routine CTA  . Prostate cancer ([place])    watchful waiting, no treatment so far  . S/P Maze operation for atrial fibrillation 10/29/2013   Complete bilateral atrial lesion set using cryothermy and bipolar radiofrequency ablation with clipping of LA appendage via right mini thoracotomy  . Shortness of breath    seen by Stanford Breed, & low energy     Past Surgical History:  Procedure Laterality Date  . CARDIAC CATHETERIZATION  06/2013  . CARDIOVERSION N/A 06/30/2013   Procedure: CARDIOVERSION;  Surgeon: [name], MD;  Location: [place];  Service: Cardiovascular;  Laterality: N/A;  .  CARDIOVERSION N/A 07/31/2013   Procedure: CARDIOVERSION;  Surgeon: Thayer Headings, MD;  Location: [place];  Service: Cardiovascular;  Laterality: N/A;  . CARDIOVERSION N/A 03/19/2018   Procedure: CARDIOVERSION;  Surgeon: [name], MD;  Location: [facility] ENDOSCOPY;  Service: Cardiovascular;  Laterality: N/A;  . CARDIOVERSION N/A 07/24/2018   Procedure: CARDIOVERSION;  Surgeon: [name], MD;  Location: [facility] ENDOSCOPY;  Service: Cardiovascular;  Laterality: N/A;  . CLIPPING OF ATRIAL APPENDAGE  10/29/2013   Procedure: CLIPPING OF ATRIAL APPENDAGE;  Surgeon: Rexene Alberts, MD;  Location: [place];  Service: Open Heart Surgery;;  . INTRAOPERATIVE TRANSESOPHAGEAL ECHOCARDIOGRAM N/A 10/29/2013   Procedure: INTRAOPERATIVE TRANSESOPHAGEAL ECHOCARDIOGRAM;  Surgeon: Rexene Alberts, MD;  Location: [place];  Service: Open Heart Surgery;  Laterality: N/A;  . LEFT AND RIGHT HEART CATHETERIZATION WITH CORONARY ANGIOGRAM N/A 06/27/2013   Procedure: LEFT AND RIGHT HEART CATHETERIZATION WITH CORONARY ANGIOGRAM;  Surgeon: Jettie Booze, MD;  Location: [facility] CATH LAB;  Service: Cardiovascular;  Laterality: N/A;  . MEDIAL PARTIAL KNEE REPLACEMENT Left 1990's?  Marland Kitchen MINIMALLY INVASIVE MAZE PROCEDURE N/A 10/29/2013   Procedure: MINIMALLY INVASIVE MAZE PROCEDURE;  Surgeon: Rexene Alberts, MD;  Location: [place];  Service: Open Heart Surgery;  Laterality: N/A;  . TEE WITHOUT CARDIOVERSION N/A 06/30/2013   Procedure: TRANSESOPHAGEAL ECHOCARDIOGRAM (TEE) ;  Surgeon: [name], MD;  Location: [place];  Service: Cardiovascular;  Laterality:  N/A;  . TRANSURETHRAL RESECTION OF PROSTATE  2010    Family History  Problem Relation Age of Onset  . Healthy Father   . Healthy Mother   . Healthy Sister     Social History   Socioeconomic History  . Marital status: Widowed    Spouse name: Not on file  . Number of children: Not on file  . Years of education: Not on file  . Highest education level: Not on file   Occupational History  . Not on file  Tobacco Use  . Smoking status: Never Smoker  . Smokeless tobacco: Never Used  . Tobacco comment: 06/25/2013 "smoked very light; years and years ago"  Vaping Use  . Vaping Use: Never used  Substance and Sexual Activity  . Alcohol use: Yes    Alcohol/week: 14.0 standard drinks    Types: 7 Glasses of wine, 7 Shots of liquor per week  . Drug use: No  . Sexual activity: Not Currently  Other Topics Concern  . Not on file  Social History Narrative   He lives in [place]. He is widowed. Wife passed of breast cancer last year. No children.    Social Determinants of Health   Financial Resource Strain: Not on file  Food Insecurity: Not on file  Transportation Needs: Not on file  Physical Activity: Not on file  Stress: Not on file  Social Connections: Not on file  Intimate Partner Violence: Not on file     PHYSICAL EXAM:  VS: BP 138/64   Ht 6\' 2"  (1.88 m)   Wt 267 lb (121.1 kg)   BMI 34.28 kg/m  Physical Exam Gen: NAD, alert, cooperative with exam, well-appearing    Aspiration/Injection Procedure Note Jose Snyder Sep 25, 1940  Procedure: Injection Indications: right knee pain   Procedure Details Consent: Risks of procedure as well as the alternatives and risks of each were explained to the (patient/caregiver).  Consent for procedure obtained. Time Out: Verified patient identification, verified procedure, site/side was marked, verified correct patient position, special equipment/implants available, medications/allergies/relevent history reviewed, required imaging and test results available.  Performed.  The area was cleaned with iodine and alcohol swabs.    The right knee superior lateral suprapatellar pouch was injected using 4 cc's of 1% lidocaine with a 21 2" needle.  The syringe was switched and a 4 mL of 22 mg/mL of monovisc was injected. Ultrasound was used. Images were obtained in  Long views showing the injection.     A sterile  dressing was applied.  Patient did tolerate procedure well.     ASSESSMENT & PLAN:   OA (osteoarthritis) of knee Completed gel injection today.  -Counseled on home exercise therapy and supportive therapy -Could consider Zilretta - could consider PT.

## 2020-03-08 MED FILL — JANSSEN COVID-19 VACCINE 0.: 0.5 | 1 days supply | Qty: 1 | Fill #0

## 2020-04-02 ENCOUNTER — Encounter: Payer: Self-pay | Admitting: Podiatry

## 2020-04-02 ENCOUNTER — Ambulatory Visit (INDEPENDENT_AMBULATORY_CARE_PROVIDER_SITE_OTHER): Payer: Medicare Other | Admitting: Podiatry

## 2020-04-02 ENCOUNTER — Other Ambulatory Visit: Payer: Self-pay

## 2020-04-02 DIAGNOSIS — M79676 Pain in unspecified toe(s): Secondary | ICD-10-CM | POA: Diagnosis not present

## 2020-04-02 DIAGNOSIS — B351 Tinea unguium: Secondary | ICD-10-CM

## 2020-04-02 DIAGNOSIS — D689 Coagulation defect, unspecified: Secondary | ICD-10-CM | POA: Diagnosis not present

## 2020-04-02 NOTE — Progress Notes (Signed)
This patient returns to my office for at risk foot care.  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  This patient is unable to cut nails himself since the patient cannot reach his nails.These nails are painful walking and wearing shoes.  This patient presents for at risk foot care today.  General Appearance  Alert, conversant and in no acute stress.  Vascular  Dorsalis pedis and posterior tibial  pulses are palpable  bilaterally.  Capillary return is within normal limits  bilaterally. Temperature is within normal limits  bilaterally.  Neurologic  Senn-Weinstein monofilament wire test within normal limits  bilaterally. Muscle power within normal limits bilaterally.  Nails Thick disfigured discolored nails with subungual debris  from hallux to fifth toes bilaterally. No evidence of bacterial infection or drainage bilaterally.  Orthopedic  No limitations of motion  feet .  No crepitus or effusions noted.  No bony pathology or digital deformities noted.  Skin  normotropic skin with no porokeratosis noted bilaterally.  No signs of infections or ulcers noted.     Onychomycosis  Pain in right toes  Pain in left toes  Consent was obtained for treatment procedures.   Mechanical debridement of nails 1-5  bilaterally performed with a nail nipper.  Filed with dremel without incident.  Self avulsed 1,4 toenail right foot with no evidence of infection.   Return office visit   3 months                   Told patient to return for periodic foot care and evaluation due to potential at risk complications.   Tamika Shropshire DPM  

## 2020-06-09 ENCOUNTER — Encounter: Payer: Self-pay | Admitting: Family Medicine

## 2020-06-09 ENCOUNTER — Ambulatory Visit: Payer: Self-pay

## 2020-06-09 ENCOUNTER — Other Ambulatory Visit: Payer: Self-pay

## 2020-06-09 ENCOUNTER — Ambulatory Visit (INDEPENDENT_AMBULATORY_CARE_PROVIDER_SITE_OTHER): Payer: Medicare Other | Admitting: Family Medicine

## 2020-06-09 ENCOUNTER — Ambulatory Visit (HOSPITAL_BASED_OUTPATIENT_CLINIC_OR_DEPARTMENT_OTHER)
Admission: RE | Admit: 2020-06-09 | Discharge: 2020-06-09 | Disposition: A | Discharge status: Home / Self Care | Payer: Medicare Other | Source: Ambulatory Visit | Attending: Family Medicine | Admitting: Family Medicine

## 2020-06-09 VITALS — BP 140/80 | Ht 74.0 in | Wt 267.0 lb

## 2020-06-09 DIAGNOSIS — M84453A Pathological fracture, unspecified femur, initial encounter for fracture: Secondary | ICD-10-CM | POA: Insufficient documentation

## 2020-06-09 DIAGNOSIS — M8560 Other cyst of bone, unspecified site: Secondary | ICD-10-CM | POA: Insufficient documentation

## 2020-06-09 DIAGNOSIS — M25561 Pain in right knee: Secondary | ICD-10-CM | POA: Diagnosis not present

## 2020-06-09 MED ORDER — HYDROCODONE-ACETAMINOPHEN 5-325 MG PO TABS: 1.0000 | ORAL_TABLET | Freq: Three times a day (TID) | ORAL | 0 refills | Status: DC | PRN

## 2020-06-09 MED ORDER — TRIAMCINOLONE ACETONIDE 40 MG/ML IJ SUSP
40.0000 mg | Freq: Once | INTRAMUSCULAR | Status: AC
  Administered 2020-06-09: 40 mg via INTRA_ARTICULAR

## 2020-06-09 NOTE — Patient Instructions (Signed)
Good to see you Please try ice as needed  Please try physical therapy  Please try to get the rolling walker  I will call with the xray results.  Please use the pain medicine as needed. Please take with a fiber supplement.   Please send me a message in MyChart with any questions or updates.  We will schedule a virtual visit once the MRi is resulted .   --Dr. Raeford Razor

## 2020-06-09 NOTE — Assessment & Plan Note (Signed)
Concern for insufficiency fracture given the severity of pain and the findings on ultrasound. -Counseled on home exercise therapy and supportive care. -Injection. -Referral to physical therapy. -Rollator and counseled on partial weightbearing. -X-ray. -Norco. -MRI to evaluate for insufficiency fracture.

## 2020-06-09 NOTE — Progress Notes (Signed)
Jose Snyder - 80 y.o. male MRN [id number]  Date of birth: 17-Nov-1940  SUBJECTIVE:  Including CC & ROS.  No chief complaint on file.   Jose Snyder is a 80 y.o. male that is presenting with acute worsening of his right knee pain.  He has got severe over the past week and a half.  Severe pain with any form of weightbearing.  Different than what has felt previously.  He got limited no improvement with the gel injection.   Review of Systems See HPI   HISTORY: Past Medical, Surgical, Social, and Family History Reviewed & Updated per EMR.   Pertinent Historical Findings include:  Past Medical History:  Diagnosis Date  . Anxiety   . Arthritis    R hand- OA  . Atrial Fibrillation  06/25/2013  . CAD (coronary artery disease), native coronary artery 06/28/2013   a. LHC (06/27/13):  Mid LAD 40%, mid D1 75%, mid CFX 50-60%, mid RCA 50%, EF 20%, LVEDP 24 mmHg.  . CHF (congestive heart failure) ([place])   . Chronic combined systolic and diastolic heart failure ([place]) 06/26/2013   a.  Echo (06/25/13):  EF 20-25%, diff HK, restrictive physio, mild MR, mod to severe LAE, mild RVE, mildly reduced RVSF, mod to severe RAE  . Chronic kidney disease   . Dyslipidemia 07/15/2013  . Dysrhythmia    a-fib  . GERD (gastroesophageal reflux disease)   . Hypertension   . Incidental pulmonary nodule 06/22/2014   Seen on CT angiogram of chest  . NICM (nonischemic cardiomyopathy) with EF 20-25% 06/26/2013  . Obesity   . Penetrating atherosclerotic ulcer of aorta ([place]) 10/22/2013   Small penetrating ulcer of descending thoracic aorta discovered on routine CTA  . Prostate cancer ([place])    watchful waiting, no treatment so far  . S/P Maze operation for atrial fibrillation 10/29/2013   Complete bilateral atrial lesion set using cryothermy and bipolar radiofrequency ablation with clipping of LA appendage via right mini thoracotomy  . Shortness of breath    seen by Stanford Breed, & low energy     Past Surgical History:  Procedure  Laterality Date  . CARDIAC CATHETERIZATION  06/2013  . CARDIOVERSION N/A 06/30/2013   Procedure: CARDIOVERSION;  Surgeon: [name], MD;  Location: [place];  Service: Cardiovascular;  Laterality: N/A;  . CARDIOVERSION N/A 07/31/2013   Procedure: CARDIOVERSION;  Surgeon: Thayer Headings, MD;  Location: [place];  Service: Cardiovascular;  Laterality: N/A;  . CARDIOVERSION N/A 03/19/2018   Procedure: CARDIOVERSION;  Surgeon: [name], MD;  Location: [facility] ENDOSCOPY;  Service: Cardiovascular;  Laterality: N/A;  . CARDIOVERSION N/A 07/24/2018   Procedure: CARDIOVERSION;  Surgeon: [name], MD;  Location: [facility] ENDOSCOPY;  Service: Cardiovascular;  Laterality: N/A;  . CLIPPING OF ATRIAL APPENDAGE  10/29/2013   Procedure: CLIPPING OF ATRIAL APPENDAGE;  Surgeon: Rexene Alberts, MD;  Location: [place];  Service: Open Heart Surgery;;  . INTRAOPERATIVE TRANSESOPHAGEAL ECHOCARDIOGRAM N/A 10/29/2013   Procedure: INTRAOPERATIVE TRANSESOPHAGEAL ECHOCARDIOGRAM;  Surgeon: Rexene Alberts, MD;  Location: [place];  Service: Open Heart Surgery;  Laterality: N/A;  . LEFT AND RIGHT HEART CATHETERIZATION WITH CORONARY ANGIOGRAM N/A 06/27/2013   Procedure: LEFT AND RIGHT HEART CATHETERIZATION WITH CORONARY ANGIOGRAM;  Surgeon: Jettie Booze, MD;  Location: [facility] CATH LAB;  Service: Cardiovascular;  Laterality: N/A;  . MEDIAL PARTIAL KNEE REPLACEMENT Left 1990's?  Marland Kitchen MINIMALLY INVASIVE MAZE PROCEDURE N/A 10/29/2013   Procedure: MINIMALLY INVASIVE MAZE PROCEDURE;  Surgeon: Rexene Alberts,  MD;  Location: [facility] OR;  Service: Open Heart Surgery;  Laterality: N/A;  . TEE WITHOUT CARDIOVERSION N/A 06/30/2013   Procedure: TRANSESOPHAGEAL ECHOCARDIOGRAM (TEE) ;  Surgeon: [name], MD;  Location: [place];  Service: Cardiovascular;  Laterality: N/A;  . TRANSURETHRAL RESECTION OF PROSTATE  2010    Family History  Problem Relation Age of Onset  . Healthy Father   . Healthy Mother   . Healthy Sister      Social History   Socioeconomic History  . Marital status: Widowed    Spouse name: Not on file  . Number of children: Not on file  . Years of education: Not on file  . Highest education level: Not on file  Occupational History  . Not on file  Tobacco Use  . Smoking status: Never Smoker  . Smokeless tobacco: Never Used  . Tobacco comment: 06/25/2013 "smoked very light; years and years ago"  Vaping Use  . Vaping Use: Never used  Substance and Sexual Activity  . Alcohol use: Yes    Alcohol/week: 14.0 standard drinks    Types: 7 Glasses of wine, 7 Shots of liquor per week  . Drug use: No  . Sexual activity: Not Currently  Other Topics Concern  . Not on file  Social History Narrative   He lives in [place]. He is widowed. Wife passed of breast cancer last year. No children.    Social Determinants of Health   Financial Resource Strain: Not on file  Food Insecurity: Not on file  Transportation Needs: Not on file  Physical Activity: Not on file  Stress: Not on file  Social Connections: Not on file  Intimate Partner Violence: Not on file     PHYSICAL EXAM:  VS: BP 140/80 (BP Location: Right Arm, Patient Position: Sitting, Cuff Size: Large)   Ht 6\' 2"  (1.88 m)   Wt 267 lb (121.1 kg)   BMI 34.28 kg/m  Physical Exam Gen: NAD, alert, cooperative with exam, well-appearing MSK:  Right knee: No significant effusion. Normal passive range of motion. No redness or streaking. Neurovascular intact  Limited ultrasound: Right knee:  Trace effusion. Significant hyperemia over the medial femoral condyle  Summary: Findings consistent with insufficiency fracture  Ultrasound and interpretation by Clearance Coots, MD   Aspiration/Injection Procedure Note Jose Snyder 1941/02/09  Procedure: Injection Indications: Right knee pain  Procedure Details Consent: Risks of procedure as well as the alternatives and risks of each were explained to the (patient/caregiver).  Consent  for procedure obtained. Time Out: Verified patient identification, verified procedure, site/side was marked, verified correct patient position, special equipment/implants available, medications/allergies/relevent history reviewed, required imaging and test results available.  Performed.  The area was cleaned with iodine and alcohol swabs.    The right knee superior lateral suprapatellar pouch was injected using 1 cc's of 40 mg Kenalog and 4 cc's of 0.25% bupivacaine with a 25 1 1/2" needle.  Ultrasound was used. Images were obtained in long views showing the injection.     A sterile dressing was applied.  Patient did tolerate procedure well.    ASSESSMENT & PLAN:   Insufficiency fracture of medial femoral condyle (HCC) Concern for insufficiency fracture given the severity of pain and the findings on ultrasound. -Counseled on home exercise therapy and supportive care. -Injection. -Referral to physical therapy. -Rollator and counseled on partial weightbearing. -X-ray. -Norco. -MRI to evaluate for insufficiency fracture.

## 2020-06-14 ENCOUNTER — Telehealth: Payer: Self-pay | Admitting: Family Medicine

## 2020-06-14 NOTE — Telephone Encounter (Signed)
Unable to leave VM for patient. If he calls back please have him speak with a nurse/CMA and inform that his xray shows mild degenerative changes.   If any questions then please take the best time and phone number to call and I will try to call him back.   Rosemarie Ax, MD [facility] 06/14/2020, 10:21 AM

## 2020-06-19 ENCOUNTER — Other Ambulatory Visit: Payer: Self-pay

## 2020-06-19 ENCOUNTER — Ambulatory Visit (HOSPITAL_BASED_OUTPATIENT_CLINIC_OR_DEPARTMENT_OTHER)
Admission: RE | Admit: 2020-06-19 | Discharge: 2020-06-19 | Disposition: A | Discharge status: Home / Self Care | Payer: Medicare Other | Source: Ambulatory Visit | Attending: Family Medicine | Admitting: Family Medicine

## 2020-06-19 DIAGNOSIS — M2241 Chondromalacia patellae, right knee: Secondary | ICD-10-CM | POA: Diagnosis not present

## 2020-06-19 DIAGNOSIS — M84453A Pathological fracture, unspecified femur, initial encounter for fracture: Secondary | ICD-10-CM | POA: Diagnosis not present

## 2020-06-19 DIAGNOSIS — S83241A Other tear of medial meniscus, current injury, right knee, initial encounter: Secondary | ICD-10-CM | POA: Diagnosis not present

## 2020-06-19 DIAGNOSIS — M25461 Effusion, right knee: Secondary | ICD-10-CM | POA: Diagnosis not present

## 2020-06-19 DIAGNOSIS — M1711 Unilateral primary osteoarthritis, right knee: Secondary | ICD-10-CM | POA: Diagnosis not present

## 2020-06-21 ENCOUNTER — Telehealth: Payer: Self-pay | Admitting: Family Medicine

## 2020-06-21 ENCOUNTER — Other Ambulatory Visit: Payer: Self-pay | Admitting: Family Medicine

## 2020-06-21 DIAGNOSIS — M84453A Pathological fracture, unspecified femur, initial encounter for fracture: Secondary | ICD-10-CM

## 2020-06-21 NOTE — Telephone Encounter (Signed)
Patient called request refill on:  HYDROcodone-acetaminophen (NORCO/VICODIN) 5-325 MG tablet [[id number]]   Order Details Dose: 1 tablet Route: Oral Frequency: Every 8 hours PRN  Dispense Quantity: 15 tablet Refills: 0       Sig: Take 1 tablet by mouth every 8 (eight) hours as needed.       Pt uses :   Pharmacy  [place], [place] - [address]  [address], [place] [address]  Phone:  [phone] Fax:  [phone]    --glh

## 2020-06-22 MED ORDER — HYDROCODONE-ACETAMINOPHEN 5-325 MG PO TABS: 1.0000 | ORAL_TABLET | Freq: Three times a day (TID) | ORAL | 0 refills | Status: DC | PRN

## 2020-06-22 NOTE — Telephone Encounter (Signed)
Refill Lenoard Aden, MD [facility] 06/22/2020, 8:43 AM

## 2020-06-23 ENCOUNTER — Encounter: Payer: Self-pay | Admitting: Family Medicine

## 2020-06-23 ENCOUNTER — Encounter: Payer: Self-pay | Admitting: Physical Therapy

## 2020-06-23 ENCOUNTER — Ambulatory Visit: Payer: Medicare Other | Attending: Family Medicine | Admitting: Physical Therapy

## 2020-06-23 ENCOUNTER — Other Ambulatory Visit: Payer: Self-pay

## 2020-06-23 ENCOUNTER — Ambulatory Visit (INDEPENDENT_AMBULATORY_CARE_PROVIDER_SITE_OTHER): Payer: Medicare Other | Admitting: Family Medicine

## 2020-06-23 DIAGNOSIS — M8560 Other cyst of bone, unspecified site: Secondary | ICD-10-CM

## 2020-06-23 DIAGNOSIS — G8929 Other chronic pain: Secondary | ICD-10-CM | POA: Insufficient documentation

## 2020-06-23 DIAGNOSIS — R29898 Other symptoms and signs involving the musculoskeletal system: Secondary | ICD-10-CM | POA: Diagnosis not present

## 2020-06-23 DIAGNOSIS — R2681 Unsteadiness on feet: Secondary | ICD-10-CM | POA: Diagnosis not present

## 2020-06-23 DIAGNOSIS — R2689 Other abnormalities of gait and mobility: Secondary | ICD-10-CM | POA: Insufficient documentation

## 2020-06-23 DIAGNOSIS — M6281 Muscle weakness (generalized): Secondary | ICD-10-CM | POA: Insufficient documentation

## 2020-06-23 DIAGNOSIS — M25561 Pain in right knee: Secondary | ICD-10-CM | POA: Diagnosis not present

## 2020-06-23 NOTE — Therapy (Signed)
[facility] [address]  [place], [place], [address] Phone: [phone]   Fax:  [phone]  Physical Therapy Evaluation  Patient Details  Name: Jose Snyder MRN: [id number] Date of Birth: 1940/05/09 Referring Provider (PT): Clearance Coots, MD   Encounter Date: 06/23/2020   PT End of Session - 06/23/20 1021    Visit Number 1    Number of Visits 13    Date for PT Re-Evaluation 08/04/20    Authorization Type Medicare & BCBS    PT Start Time [phone]    PT Stop Time 1013    PT Time Calculation (min) 39 min    Activity Tolerance Patient tolerated treatment well    Behavior During Therapy [facility] for tasks assessed/performed           Past Medical History:  Diagnosis Date  . Anxiety   . Arthritis    R hand- OA  . Atrial Fibrillation  06/25/2013  . CAD (coronary artery disease), native coronary artery 06/28/2013   a. LHC (06/27/13):  Mid LAD 40%, mid D1 75%, mid CFX 50-60%, mid RCA 50%, EF 20%, LVEDP 24 mmHg.  . CHF (congestive heart failure) ([place])   . Chronic combined systolic and diastolic heart failure ([place]) 06/26/2013   a.  Echo (06/25/13):  EF 20-25%, diff HK, restrictive physio, mild MR, mod to severe LAE, mild RVE, mildly reduced RVSF, mod to severe RAE  . Chronic kidney disease   . Dyslipidemia 07/15/2013  . Dysrhythmia    a-fib  . GERD (gastroesophageal reflux disease)   . Hypertension   . Incidental pulmonary nodule 06/22/2014   Seen on CT angiogram of chest  . NICM (nonischemic cardiomyopathy) with EF 20-25% 06/26/2013  . Obesity   . Penetrating atherosclerotic ulcer of aorta ([place]) 10/22/2013   Small penetrating ulcer of descending thoracic aorta discovered on routine CTA  . Prostate cancer ([place])    watchful waiting, no treatment so far  . S/P Maze operation for atrial fibrillation 10/29/2013   Complete bilateral atrial lesion set using cryothermy and bipolar radiofrequency ablation with clipping of LA appendage via  right mini thoracotomy  . Shortness of breath    seen by Stanford Breed, & low energy     Past Surgical History:  Procedure Laterality Date  . CARDIAC CATHETERIZATION  06/2013  . CARDIOVERSION N/A 06/30/2013   Procedure: CARDIOVERSION;  Surgeon: [name], MD;  Location: [place];  Service: Cardiovascular;  Laterality: N/A;  . CARDIOVERSION N/A 07/31/2013   Procedure: CARDIOVERSION;  Surgeon: Thayer Headings, MD;  Location: [place];  Service: Cardiovascular;  Laterality: N/A;  . CARDIOVERSION N/A 03/19/2018   Procedure: CARDIOVERSION;  Surgeon: [name], MD;  Location: [facility] ENDOSCOPY;  Service: Cardiovascular;  Laterality: N/A;  . CARDIOVERSION N/A 07/24/2018   Procedure: CARDIOVERSION;  Surgeon: [name], MD;  Location: [facility] ENDOSCOPY;  Service: Cardiovascular;  Laterality: N/A;  . CLIPPING OF ATRIAL APPENDAGE  10/29/2013   Procedure: CLIPPING OF ATRIAL APPENDAGE;  Surgeon: Rexene Alberts, MD;  Location: [facility];  Service: Open Heart Surgery;;  . INTRAOPERATIVE TRANSESOPHAGEAL ECHOCARDIOGRAM N/A 10/29/2013   Procedure: INTRAOPERATIVE TRANSESOPHAGEAL ECHOCARDIOGRAM;  Surgeon: Rexene Alberts, MD;  Location: [place];  Service: Open Heart Surgery;  Laterality: N/A;  . LEFT AND RIGHT HEART CATHETERIZATION WITH CORONARY ANGIOGRAM N/A 06/27/2013   Procedure: LEFT AND RIGHT HEART CATHETERIZATION WITH CORONARY ANGIOGRAM;  Surgeon: Jettie Booze, MD;  Location: [facility] CATH LAB;  Service: Cardiovascular;  Laterality: N/A;  . MEDIAL PARTIAL KNEE REPLACEMENT Left 1990's?  Marland Kitchen MINIMALLY INVASIVE MAZE PROCEDURE N/A 10/29/2013   Procedure: MINIMALLY INVASIVE MAZE PROCEDURE;  Surgeon: Rexene Alberts, MD;  Location: [place];  Service: Open Heart Surgery;  Laterality: N/A;  . TEE WITHOUT CARDIOVERSION N/A 06/30/2013   Procedure: TRANSESOPHAGEAL ECHOCARDIOGRAM (TEE) ;  Surgeon: [name], MD;  Location: [facility] ENDOSCOPY;  Service: Cardiovascular;  Laterality: N/A;  . TRANSURETHRAL RESECTION OF PROSTATE  2010     There were no vitals filed for this visit.    Subjective Assessment - 06/23/20 0937    Subjective Patient reports that he has been getting SOB, shakiness in his hands, and noticing more imbalance since his heart surgery in 2015. Denies falls. Reports R knee pain starting a little before Thanksgiving last year. Does not recall any inciting injury. Pain is located over the medial R knee with radiation across the patella. Better with meds. Worse in the AM and with "strenuous activity" such as weight bearing and a lot of movement. Was given knee brace to wear PRN- wearing it some but notes that it aggravates his knee. Has been told to stay off of it so he is only walking when necessary. Still notes instability with stepping into his step-in shower- does have a shower seat and grab bars. Walking with [phone] for 2 weeks now, was using SPC prior to that. Notes instability when first standing up or when turning. Denies dizziness.    Pertinent History prostate CA with resection 2010, HTN, GERD, HLD, CKD, chronic systolic/diastolic HF, CHF, CAD, a-fib, anxiety, L medial partial knee arthroplasty 1990's    Limitations House hold activities;Walking;Standing;Lifting;Sitting    How long can you stand comfortably? 5 min    How long can you walk comfortably? 5 min    Diagnostic tests 06/19/20 R knee MRI: Radial tear involving the posterior horn of the medial meniscus, probably remote large osteochondral lesion of medial femoral condyle    Patient Stated Goals decrease pain and increase strength    Currently in Pain? Yes    Pain Score 6     Pain Location Knee    Pain Orientation Right;Medial    Pain Descriptors / Indicators Dull    Pain Type Chronic pain              [facility] PT Assessment - 06/23/20 0947      Assessment   Medical Diagnosis Insufficiency fx of medial femoral condyle    Referring Provider (PT) Clearance Coots, MD    Next MD Visit 07/22/20    Prior Therapy yes      Balance Screen   Has  the patient fallen in the past 6 months No    Has the patient had a decrease in activity level because of a fear of falling?  Yes    Is the patient reluctant to leave their home because of a fear of falling?  No      Home Environment   Living Environment Private residence    Living Arrangements Alone    Available Help at Discharge Family    Type of [place] to enter    Entrance Stairs-Number of Steps 2    Entrance Stairs-Rails None   able to hold onto door   Home Layout One level    [place] - single point;Walker - 4 wheels;Grab bars - tub/shower;Shower seat - built in      Prior Function  Level of Independence Independent   has a cleaning lady for deep cleaning   Vocation Retired;On disability    Leisure shooting range      Cognition   Overall Cognitive Status Within Functional Limits for tasks assessed      Sensation   Light Touch Appears Intact   c/o N/T in B feet intermittently     Coordination   Gross Motor Movements are Fluid and Coordinated Yes      Posture/Postural Control   Posture/Postural Control Postural limitations    Postural Limitations Rounded Shoulders;Forward head      ROM / Strength   AROM / PROM / Strength AROM;Strength;PROM      AROM   AROM Assessment Site Knee    Right/Left Knee Right;Left    Right Knee Extension 3    Right Knee Flexion 125    Left Knee Extension 8    Left Knee Flexion 125      PROM   PROM Assessment Site Knee    Right/Left Knee Right;Left    Right Knee Extension 2    Right Knee Flexion 130    Left Knee Extension 7    Left Knee Flexion 126      Strength   Strength Assessment Site Knee;Hip;Ankle    Right/Left Hip Right;Left    Right Hip Flexion 4+/5    Right Hip ABduction 4+/5    Right Hip ADduction 4/5    Left Hip Flexion 4+/5    Left Hip ABduction 4+/5    Left Hip ADduction 4/5    Right/Left Knee Right;Left    Right Knee Flexion 4/5    Right Knee Extension 4/5    Left Knee  Flexion 4/5    Left Knee Extension 4+/5    Right/Left Ankle Right;Left    Right Ankle Dorsiflexion 4+/5    Right Ankle Plantar Flexion 4+/5    Left Ankle Dorsiflexion 4/5    Left Ankle Plantar Flexion 4+/5      Palpation   Patella mobility R patella with good mobility; L patellar slightly limited in superior glide    Palpation comment TTP over R medial jt line; R medial knee very mildly edematous      Ambulation/Gait   Gait Pattern Step-through pattern;Decreased stance time - right;Decreased step length - left;Decreased weight shift to right;Trunk flexed   pushing 4WW too far forward   Ambulation Surface Level;Indoor    Gait velocity decreased                      Objective measurements completed on examination: See above findings.               PT Education - 06/23/20 1020    Education Details prognosis, POC, HEP; educated patient on correcting gait deviations and posture when ambulating with RW for safety- unable to adjust walker height any taller    Person(s) Educated Patient    Methods Explanation;Demonstration;Tactile cues;Verbal cues;Handout    Comprehension Verbalized understanding;Returned demonstration            PT Short Term Goals - 06/23/20 1027      PT SHORT TERM GOAL #1   Title Patient to be independent with initial HEP.    Time 3    Period Weeks    Status New    Target Date 07/14/20             PT Long Term Goals - 06/23/20 1027      PT LONG  TERM GOAL #1   Title Patient to be independent with advanced HEP.    Time 6    Period Weeks    Status New    Target Date 08/04/20      PT LONG TERM GOAL #2   Title Patient to demonstrate B LE strength >/=4+/5.    Time 6    Period Weeks    Status New    Target Date 08/04/20      PT LONG TERM GOAL #3   Title Patient to demonstrate R knee AROM/PROM 0-120 degrees.    Time 6    Period Weeks    Status New    Target Date 08/04/20      PT LONG TERM GOAL #4   Title Patient to score  >/=48/56 on Berg in order to decrease risk of falls.    Time 6    Period Weeks    Status New    Target Date 08/04/20      PT LONG TERM GOAL #5   Title Patient to report 85% improvement in balance confidence with shower transfers.    Time 6    Period Weeks    Status New    Target Date 08/04/20      Additional Long Term Goals   Additional Long Term Goals Yes      PT LONG TERM GOAL #6   Title Patient to ambulate atleast 1278ft with 6 minute walk test with 4WW in order to improve functional activity tolerance and endurance.    Time 6    Period Weeks    Status New    Target Date 08/04/20                  Plan - 06/23/20 1021    Clinical Impression Statement Patient is a 80 y/o F presenting to OPPT with c/o chronic R knee pain and imbalance for the past 5 months and 7 years, respectively. Patient diagnosed with insufficiency fx of R medial femoral condyle, with pain localized to medial knee. Worse in AM and with [place] activities. Noting imbalance with transferring into the shower, when first standing up, and with turning. Denies recent falls or dizziness. Patient currently ambulates with 7DZ. Patient today presenting with rounded shoulders and forward head posture, limited B knee extension AROM, decreased LE strength, TTP over R medial jt line, mild edema over R medial knee, and gait deviations. Plan to assess balance next session. Patient was educated on gentle strengthening HEP- patient reported understanding. Would benefit from skilled PT services 2x/week for 6 weeks to address aforementioned impairments.    Personal Factors and Comorbidities Age;Comorbidity 3+;Fitness;Past/Current Experience;Time since onset of injury/illness/exacerbation    Comorbidities prostate CA with resection 2010, HTN, GERD, HLD, CKD, chronic systolic/diastolic HF, CHF, CAD, a-fib, anxiety, L medial partial knee arthroplasty 1990's    Examination-Activity Limitations  Bathing;Squat;Stairs;Stand;Toileting;Transfers;Reach Overhead;Locomotion Level;Lift;Hygiene/Grooming;Dressing;Carry;Bend    Examination-Participation Restrictions Church;Cleaning;Community Activity;Laundry;Meal Prep;Shop    Stability/Clinical Decision Making Stable/Uncomplicated    Clinical Decision Making Low    Rehab Potential Good    PT Frequency 2x / week    PT Duration 6 weeks    PT Treatment/Interventions ADLs/Self Care Home Management;Cryotherapy;Electrical Stimulation;Iontophoresis 4mg /ml Dexamethasone;Moist Heat;Balance training;Therapeutic exercise;Therapeutic activities;Functional mobility training;Stair training;Gait training;Ultrasound;Neuromuscular re-education;Patient/family education;Manual techniques;Vestibular;Vasopneumatic Device;Taping;Energy conservation;Dry needling;Passive range of motion    PT Next Visit Plan knee FOTO; assess berg; reassess HEP; progress LE strengthening as tolerated    Consulted and Agree with Plan of Care Patient  Patient will benefit from skilled therapeutic intervention in order to improve the following deficits and impairments:  Abnormal gait,Decreased endurance,Increased edema,Pain,Increased fascial restricitons,Decreased strength,Decreased activity tolerance,Decreased balance,Difficulty walking,Improper body mechanics,Decreased range of motion,Impaired flexibility,Postural dysfunction  Visit Diagnosis: Chronic pain of right knee  Muscle weakness (generalized)  Unsteadiness on feet  Other symptoms and signs involving the musculoskeletal system  Other abnormalities of gait and mobility     Problem List Patient Active Problem List   Diagnosis Date Noted  . 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 OA (osteoarthritis) of knee 01/08/2020  . Atypical atrial flutter ([place])   . Incidental pulmonary nodule 06/22/2014  . Chronic kidney disease   . S/P Maze operation for atrial fibrillation 10/29/2013  . Penetrating atherosclerotic ulcer  of aorta ([place]) 10/22/2013  . Chronic atrial fibrillation ([place]) 09/24/2013  . Dyslipidemia 07/15/2013  . CAD (coronary artery disease), native coronary artery 06/28/2013  . Dyspnea on exertion 06/26/2013  . History of prostate cancer 06/26/2013  . Chronic combined systolic and diastolic heart failure ([place]) 06/26/2013  . NICM (nonischemic cardiomyopathy) with EF 20-25% 06/26/2013    Class: Diagnosis of  . Atrial Fibrillation  06/25/2013  . HTN (hypertension) 06/25/2013    Janene Harvey, PT, DPT 06/23/20 10:35 AM   [facility] [address]  [place], [place], [address] Phone: [phone]   Fax:  [phone]  Name: Jose Snyder MRN: [id number] Date of Birth: September 27, 1940

## 2020-06-23 NOTE — Progress Notes (Signed)
Jose Snyder - 80 y.o. male MRN [id number]  Date of birth: 02-11-1941  SUBJECTIVE:  Including CC & ROS.  No chief complaint on file.   Jose Snyder is a 80 y.o. male that is following up after the MRI of his right knee.  He has gotten improvement of his pain and function with a walker and pain medication.  MRI was demonstrating a large osteochondral lesion in the medial femoral condyle that is a subchondral cyst or geode.  Also has a tear of the medial meniscus.   Review of Systems See HPI   HISTORY: Past Medical, Surgical, Social, and Family History Reviewed & Updated per EMR.   Pertinent Historical Findings include:  Past Medical History:  Diagnosis Date  . Anxiety   . Arthritis    R hand- OA  . Atrial Fibrillation  06/25/2013  . CAD (coronary artery disease), native coronary artery 06/28/2013   a. LHC (06/27/13):  Mid LAD 40%, mid D1 75%, mid CFX 50-60%, mid RCA 50%, EF 20%, LVEDP 24 mmHg.  . CHF (congestive heart failure) ([place])   . Chronic combined systolic and diastolic heart failure ([place]) 06/26/2013   a.  Echo (06/25/13):  EF 20-25%, diff HK, restrictive physio, mild MR, mod to severe LAE, mild RVE, mildly reduced RVSF, mod to severe RAE  . Chronic kidney disease   . Dyslipidemia 07/15/2013  . Dysrhythmia    a-fib  . GERD (gastroesophageal reflux disease)   . Hypertension   . Incidental pulmonary nodule 06/22/2014   Seen on CT angiogram of chest  . NICM (nonischemic cardiomyopathy) with EF 20-25% 06/26/2013  . Obesity   . Penetrating atherosclerotic ulcer of aorta ([place]) 10/22/2013   Small penetrating ulcer of descending thoracic aorta discovered on routine CTA  . Prostate cancer ([place])    watchful waiting, no treatment so far  . S/P Maze operation for atrial fibrillation 10/29/2013   Complete bilateral atrial lesion set using cryothermy and bipolar radiofrequency ablation with clipping of LA appendage via right mini thoracotomy  . Shortness of breath    seen by Stanford Breed, & low energy      Past Surgical History:  Procedure Laterality Date  . CARDIAC CATHETERIZATION  06/2013  . CARDIOVERSION N/A 06/30/2013   Procedure: CARDIOVERSION;  Surgeon: [name], MD;  Location: [facility];  Service: Cardiovascular;  Laterality: N/A;  . CARDIOVERSION N/A 07/31/2013   Procedure: CARDIOVERSION;  Surgeon: Thayer Headings, MD;  Location: [facility];  Service: Cardiovascular;  Laterality: N/A;  . CARDIOVERSION N/A 03/19/2018   Procedure: CARDIOVERSION;  Surgeon: [name], MD;  Location: [facility] ENDOSCOPY;  Service: Cardiovascular;  Laterality: N/A;  . CARDIOVERSION N/A 07/24/2018   Procedure: CARDIOVERSION;  Surgeon: [name], MD;  Location: [facility] ENDOSCOPY;  Service: Cardiovascular;  Laterality: N/A;  . CLIPPING OF ATRIAL APPENDAGE  10/29/2013   Procedure: CLIPPING OF ATRIAL APPENDAGE;  Surgeon: Rexene Alberts, MD;  Location: [place];  Service: Open Heart Surgery;;  . INTRAOPERATIVE TRANSESOPHAGEAL ECHOCARDIOGRAM N/A 10/29/2013   Procedure: INTRAOPERATIVE TRANSESOPHAGEAL ECHOCARDIOGRAM;  Surgeon: Rexene Alberts, MD;  Location: [facility];  Service: Open Heart Surgery;  Laterality: N/A;  . LEFT AND RIGHT HEART CATHETERIZATION WITH CORONARY ANGIOGRAM N/A 06/27/2013   Procedure: LEFT AND RIGHT HEART CATHETERIZATION WITH CORONARY ANGIOGRAM;  Surgeon: Jettie Booze, MD;  Location: [facility] CATH LAB;  Service: Cardiovascular;  Laterality: N/A;  . MEDIAL PARTIAL KNEE REPLACEMENT Left 1990's?  Marland Kitchen MINIMALLY INVASIVE MAZE PROCEDURE N/A 10/29/2013   Procedure: MINIMALLY  INVASIVE MAZE PROCEDURE;  Surgeon: Rexene Alberts, MD;  Location: [facility];  Service: Open Heart Surgery;  Laterality: N/A;  . TEE WITHOUT CARDIOVERSION N/A 06/30/2013   Procedure: TRANSESOPHAGEAL ECHOCARDIOGRAM (TEE) ;  Surgeon: [name], MD;  Location: [place];  Service: Cardiovascular;  Laterality: N/A;  . TRANSURETHRAL RESECTION OF PROSTATE  2010    Family History  Problem Relation Age of Onset  . Healthy Father   .  Healthy Mother   . Healthy Sister     Social History   Socioeconomic History  . Marital status: Widowed    Spouse name: Not on file  . Number of children: Not on file  . Years of education: Not on file  . Highest education level: Not on file  Occupational History  . Not on file  Tobacco Use  . Smoking status: Never Smoker  . Smokeless tobacco: Never Used  . Tobacco comment: 06/25/2013 "smoked very light; years and years ago"  Vaping Use  . Vaping Use: Never used  Substance and Sexual Activity  . Alcohol use: Yes    Alcohol/week: 14.0 standard drinks    Types: 7 Glasses of wine, 7 Shots of liquor per week  . Drug use: No  . Sexual activity: Not Currently  Other Topics Concern  . Not on file  Social History Narrative   He lives in [place]. He is widowed. Wife passed of breast cancer last year. No children.    Social Determinants of Health   Financial Resource Strain: Not on file  Food Insecurity: Not on file  Transportation Needs: Not on file  Physical Activity: Not on file  Stress: Not on file  Social Connections: Not on file  Intimate Partner Violence: Not on file     PHYSICAL EXAM:  VS: BP 138/76   Ht 6\' 2"  (1.88 m)   Wt 265 lb (120.2 kg)   BMI 34.02 kg/m  Physical Exam Gen: NAD, alert, cooperative with exam, well-appearing   ASSESSMENT & PLAN:   Subchondral bone cyst Has gotten improvement with the regimen that we have him on currently.  The walker is helping any pain medication.  MRI was demonstrating a large subchondral cyst in geode.  Unclear if this is the source of all of his pain.  He does have a tear in the medial meniscus root. -Counseled on home exercise therapy and supportive care. -Continue physical therapy. -Could consider referral to surgery if pain is ongoing.

## 2020-06-23 NOTE — Patient Instructions (Signed)
Good to see you Please try ice as needed  Please continue with physical therapy   Please send me a message in MyChart with any questions or updates.  Please see me back in 4 weeks.   --Dr. Raeford Razor

## 2020-06-23 NOTE — Assessment & Plan Note (Signed)
Has gotten improvement with the regimen that we have him on currently.  The walker is helping any pain medication.  MRI was demonstrating a large subchondral cyst in geode.  Unclear if this is the source of all of his pain.  He does have a tear in the medial meniscus root. -Counseled on home exercise therapy and supportive care. -Continue physical therapy. -Could consider referral to surgery if pain is ongoing.

## 2020-06-28 ENCOUNTER — Other Ambulatory Visit: Payer: Self-pay

## 2020-06-28 ENCOUNTER — Ambulatory Visit: Payer: Medicare Other | Attending: Family Medicine

## 2020-06-28 DIAGNOSIS — M25561 Pain in right knee: Secondary | ICD-10-CM | POA: Insufficient documentation

## 2020-06-28 DIAGNOSIS — R2689 Other abnormalities of gait and mobility: Secondary | ICD-10-CM | POA: Insufficient documentation

## 2020-06-28 DIAGNOSIS — R2681 Unsteadiness on feet: Secondary | ICD-10-CM | POA: Diagnosis not present

## 2020-06-28 DIAGNOSIS — G8929 Other chronic pain: Secondary | ICD-10-CM | POA: Diagnosis not present

## 2020-06-28 DIAGNOSIS — M6281 Muscle weakness (generalized): Secondary | ICD-10-CM | POA: Diagnosis not present

## 2020-06-28 DIAGNOSIS — R29898 Other symptoms and signs involving the musculoskeletal system: Secondary | ICD-10-CM | POA: Insufficient documentation

## 2020-06-28 NOTE — Therapy (Signed)
[facility] [address]  [place], [place], [address] Phone: [phone]   Fax:  [phone]  Physical Therapy Treatment  Patient Details  Name: Jose Snyder MRN: [id number] Date of Birth: 08-Jul-1940 Referring Provider (PT): Clearance Coots, MD   Encounter Date: 06/28/2020   PT End of Session - 06/28/20 1203    Visit Number 2    Number of Visits 13    Date for PT Re-Evaluation 08/04/20    Authorization Type Medicare & BCBS    PT Start Time 1102    PT Stop Time 1143    PT Time Calculation (min) 41 min    Activity Tolerance Patient tolerated treatment well    Behavior During Therapy [facility] for tasks assessed/performed           Past Medical History:  Diagnosis Date  . Anxiety   . Arthritis    R hand- OA  . Atrial Fibrillation  06/25/2013  . CAD (coronary artery disease), native coronary artery 06/28/2013   a. LHC (06/27/13):  Mid LAD 40%, mid D1 75%, mid CFX 50-60%, mid RCA 50%, EF 20%, LVEDP 24 mmHg.  . CHF (congestive heart failure) ([place])   . Chronic combined systolic and diastolic heart failure ([place]) 06/26/2013   a.  Echo (06/25/13):  EF 20-25%, diff HK, restrictive physio, mild MR, mod to severe LAE, mild RVE, mildly reduced RVSF, mod to severe RAE  . Chronic kidney disease   . Dyslipidemia 07/15/2013  . Dysrhythmia    a-fib  . GERD (gastroesophageal reflux disease)   . Hypertension   . Incidental pulmonary nodule 06/22/2014   Seen on CT angiogram of chest  . NICM (nonischemic cardiomyopathy) with EF 20-25% 06/26/2013  . Obesity   . Penetrating atherosclerotic ulcer of aorta ([place]) 10/22/2013   Small penetrating ulcer of descending thoracic aorta discovered on routine CTA  . Prostate cancer ([place])    watchful waiting, no treatment so far  . S/P Maze operation for atrial fibrillation 10/29/2013   Complete bilateral atrial lesion set using cryothermy and bipolar radiofrequency ablation with clipping of LA appendage via  right mini thoracotomy  . Shortness of breath    seen by Stanford Breed, & low energy     Past Surgical History:  Procedure Laterality Date  . CARDIAC CATHETERIZATION  06/2013  . CARDIOVERSION N/A 06/30/2013   Procedure: CARDIOVERSION;  Surgeon: [name], MD;  Location: [place];  Service: Cardiovascular;  Laterality: N/A;  . CARDIOVERSION N/A 07/31/2013   Procedure: CARDIOVERSION;  Surgeon: Thayer Headings, MD;  Location: [place];  Service: Cardiovascular;  Laterality: N/A;  . CARDIOVERSION N/A 03/19/2018   Procedure: CARDIOVERSION;  Surgeon: [name], MD;  Location: [facility] ENDOSCOPY;  Service: Cardiovascular;  Laterality: N/A;  . CARDIOVERSION N/A 07/24/2018   Procedure: CARDIOVERSION;  Surgeon: [name], MD;  Location: [facility] ENDOSCOPY;  Service: Cardiovascular;  Laterality: N/A;  . CLIPPING OF ATRIAL APPENDAGE  10/29/2013   Procedure: CLIPPING OF ATRIAL APPENDAGE;  Surgeon: Rexene Alberts, MD;  Location: [place];  Service: Open Heart Surgery;;  . INTRAOPERATIVE TRANSESOPHAGEAL ECHOCARDIOGRAM N/A 10/29/2013   Procedure: INTRAOPERATIVE TRANSESOPHAGEAL ECHOCARDIOGRAM;  Surgeon: Rexene Alberts, MD;  Location: [place];  Service: Open Heart Surgery;  Laterality: N/A;  . LEFT AND RIGHT HEART CATHETERIZATION WITH CORONARY ANGIOGRAM N/A 06/27/2013   Procedure: LEFT AND RIGHT HEART CATHETERIZATION WITH CORONARY ANGIOGRAM;  Surgeon: Jettie Booze, MD;  Location: [facility] CATH LAB;  Service: Cardiovascular;  Laterality: N/A;  . MEDIAL PARTIAL KNEE REPLACEMENT Left 1990's?  Marland Kitchen MINIMALLY INVASIVE MAZE PROCEDURE N/A 10/29/2013   Procedure: MINIMALLY INVASIVE MAZE PROCEDURE;  Surgeon: Rexene Alberts, MD;  Location: [place];  Service: Open Heart Surgery;  Laterality: N/A;  . TEE WITHOUT CARDIOVERSION N/A 06/30/2013   Procedure: TRANSESOPHAGEAL ECHOCARDIOGRAM (TEE) ;  Surgeon: [name], MD;  Location: [facility] ENDOSCOPY;  Service: Cardiovascular;  Laterality: N/A;  . TRANSURETHRAL RESECTION OF PROSTATE  2010     There were no vitals filed for this visit.   Subjective Assessment - 06/28/20 1110    Subjective Pt reports having SOB since A fib years ago. Knee is sore from yesterday    Pertinent History prostate CA with resection 2010, HTN, GERD, HLD, CKD, chronic systolic/diastolic HF, CHF, CAD, a-fib, anxiety, L medial partial knee arthroplasty 1990's    Diagnostic tests 06/19/20 R knee MRI: Radial tear involving the posterior horn of the medial meniscus, probably remote large osteochondral lesion of medial femoral condyle    Patient Stated Goals decrease pain and increase strength    Currently in Pain? Yes    Pain Score 4     Pain Location Knee    Pain Orientation Right;Medial    Pain Descriptors / Indicators Dull    Pain Type Chronic pain              [facility] PT Assessment - 06/28/20 0001      Standardized Balance Assessment   Standardized Balance Assessment Berg Balance Test      Berg Balance Test   Sit to Stand Able to stand  independently using hands    Standing Unsupported Able to stand 2 minutes with supervision    Sitting with Back Unsupported but Feet Supported on Floor or Stool Able to sit safely and securely 2 minutes    Stand to Sit Controls descent by using hands    Transfers Able to transfer with verbal cueing and /or supervision    Standing Unsupported with Eyes Closed Able to stand 10 seconds with supervision    Standing Unsupported with Feet Together Able to place feet together independently but unable to hold for 30 seconds    From Standing, Reach Forward with Outstretched Arm Can reach confidently >25 cm (10")    From Standing Position, Pick up Object from Floor Able to pick up shoe, needs supervision    From Standing Position, Turn to Look Behind Over each Shoulder Looks behind from both sides and weight shifts well    Turn 360 Degrees Able to turn 360 degrees safely in 4 seconds or less    Standing Unsupported, Alternately Place Feet on Step/Stool Needs assistance  to keep from falling or unable to try    Standing Unsupported, One Foot in [employer] balance while stepping or standing    Standing on One Leg Unable to try or needs assist to prevent fall    Total Score 35                         [facility] Adult PT Treatment/Exercise - 06/28/20 0001      Exercises   Exercises Knee/Hip      Knee/Hip Exercises: Aerobic   Recumbent Bike L1x58min      Knee/Hip Exercises: Seated   Long Arc Quad AROM;Right;1 set;10 reps    [employer] 10x5"    Other Seated Knee/Hip Exercises AP 10 reps both    Marching Strengthening;Both;1 set;10 reps  Hamstring Curl Strengthening;Right;1 set;10 reps    Hamstring Limitations GTband    Sit to Sand 10 reps;with UE support                    PT Short Term Goals - 06/28/20 1209      PT SHORT TERM GOAL #1   Title Patient to be independent with initial HEP.    Time 3    Period Weeks    Status On-going    Target Date 07/14/20             PT Long Term Goals - 06/28/20 1209      PT LONG TERM GOAL #1   Title Patient to be independent with advanced HEP.    Time 6    Period Weeks    Status On-going      PT LONG TERM GOAL #2   Title Patient to demonstrate B LE strength >/=4+/5.    Time 6    Period Weeks    Status On-going      PT LONG TERM GOAL #3   Title Patient to demonstrate R knee AROM/PROM 0-120 degrees.    Time 6    Period Weeks    Status On-going      PT LONG TERM GOAL #4   Title Patient to score >/=48/56 on Berg in order to decrease risk of falls.    Time 6    Period Weeks    Status On-going      PT LONG TERM GOAL #5   Title Patient to report 85% improvement in balance confidence with shower transfers.    Time 6    Period Weeks    Status On-going      PT LONG TERM GOAL #6   Title Patient to ambulate atleast 1272ft with 6 minute walk test with 4WW in order to improve functional activity tolerance and endurance.    Time 6    Period Weeks    Status On-going                  Plan - 06/28/20 1209    Clinical Impression Statement Pt responded well to treatment. Demonstrated a 35/56 score on BERG assessment. He showed good stability when standing with both LE unsupported but more difficulty with SL activities. He reported no pain, with the exercises only "increased pressure" in the muscles. Monitored pt and reminded him of pacing and practicing pursed lip breathing d/t SOB during exercises. Also educated him on locking breaks on rollator when at rest to avoid instability when standing up.    Personal Factors and Comorbidities Age;Comorbidity 3+;Fitness;Past/Current Experience;Time since onset of injury/illness/exacerbation    Comorbidities prostate CA with resection 2010, HTN, GERD, HLD, CKD, chronic systolic/diastolic HF, CHF, CAD, a-fib, anxiety, L medial partial knee arthroplasty 1990's    PT Frequency 2x / week    PT Duration 6 weeks    PT Treatment/Interventions ADLs/Self Care Home Management;Cryotherapy;Electrical Stimulation;Iontophoresis 4mg /ml Dexamethasone;Moist Heat;Balance training;Therapeutic exercise;Therapeutic activities;Functional mobility training;Stair training;Gait training;Ultrasound;Neuromuscular re-education;Patient/family education;Manual techniques;Vestibular;Vasopneumatic Device;Taping;Energy conservation;Dry needling;Passive range of motion    PT Next Visit Plan knee FOTO;reassess HEP; progress LE strengthening as tolerated    Consulted and Agree with Plan of Care Patient           Patient will benefit from skilled therapeutic intervention in order to improve the following deficits and impairments:  Abnormal gait,Decreased endurance,Increased edema,Pain,Increased fascial restricitons,Decreased strength,Decreased activity tolerance,Decreased balance,Difficulty walking,Improper body mechanics,Decreased range of motion,Impaired flexibility,Postural dysfunction  Visit  Diagnosis: Chronic pain of right knee  Muscle weakness  (generalized)  Unsteadiness on feet  Other symptoms and signs involving the musculoskeletal system  Other abnormalities of gait and mobility     Problem List Patient Active Problem List   Diagnosis Date Noted  . 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 OA (osteoarthritis) of knee 01/08/2020  . Atypical atrial flutter ([place])   . Incidental pulmonary nodule 06/22/2014  . Chronic kidney disease   . S/P Maze operation for atrial fibrillation 10/29/2013  . Penetrating atherosclerotic ulcer of aorta ([place]) 10/22/2013  . Chronic atrial fibrillation ([place]) 09/24/2013  . Dyslipidemia 07/15/2013  . CAD (coronary artery disease), native coronary artery 06/28/2013  . Dyspnea on exertion 06/26/2013  . History of prostate cancer 06/26/2013  . Chronic combined systolic and diastolic heart failure ([place]) 06/26/2013  . NICM (nonischemic cardiomyopathy) with EF 20-25% 06/26/2013    Class: Diagnosis of  . Atrial Fibrillation  06/25/2013  . HTN (hypertension) 06/25/2013    Artist Pais, PTA 06/28/2020, 12:14 PM  [facility] [address]  [place], [place], [address] Phone: [phone]   Fax:  [phone]  Name: Jose Snyder MRN: [id number] Date of Birth: 28-Jan-1941

## 2020-06-30 ENCOUNTER — Other Ambulatory Visit: Payer: Self-pay

## 2020-06-30 ENCOUNTER — Encounter: Payer: Self-pay | Admitting: Podiatry

## 2020-06-30 ENCOUNTER — Ambulatory Visit (INDEPENDENT_AMBULATORY_CARE_PROVIDER_SITE_OTHER): Payer: Medicare Other | Admitting: Podiatry

## 2020-06-30 DIAGNOSIS — IMO0002 Reserved for concepts with insufficient information to code with codable children: Secondary | ICD-10-CM | POA: Insufficient documentation

## 2020-06-30 DIAGNOSIS — B351 Tinea unguium: Secondary | ICD-10-CM

## 2020-06-30 DIAGNOSIS — I509 Heart failure, unspecified: Secondary | ICD-10-CM | POA: Insufficient documentation

## 2020-06-30 DIAGNOSIS — L408 Other psoriasis: Secondary | ICD-10-CM | POA: Insufficient documentation

## 2020-06-30 DIAGNOSIS — C61 Malignant neoplasm of prostate: Secondary | ICD-10-CM | POA: Insufficient documentation

## 2020-06-30 DIAGNOSIS — R0989 Other specified symptoms and signs involving the circulatory and respiratory systems: Secondary | ICD-10-CM | POA: Insufficient documentation

## 2020-06-30 DIAGNOSIS — R0609 Other forms of dyspnea: Secondary | ICD-10-CM | POA: Insufficient documentation

## 2020-06-30 DIAGNOSIS — D689 Coagulation defect, unspecified: Secondary | ICD-10-CM

## 2020-06-30 DIAGNOSIS — M79676 Pain in unspecified toe(s): Secondary | ICD-10-CM

## 2020-06-30 DIAGNOSIS — Z7189 Other specified counseling: Secondary | ICD-10-CM | POA: Insufficient documentation

## 2020-06-30 NOTE — Progress Notes (Signed)
This patient returns to my office for at risk foot care.  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  This patient is unable to cut nails himself since the patient cannot reach his nails.These nails are painful walking and wearing shoes.  This patient presents for at risk foot care today.  General Appearance  Alert, conversant and in no acute stress.  Vascular  Dorsalis pedis and posterior tibial  pulses are palpable  bilaterally.  Capillary return is within normal limits  bilaterally. Temperature is within normal limits  bilaterally.  Neurologic  Senn-Weinstein monofilament wire test within normal limits  bilaterally. Muscle power within normal limits bilaterally.  Nails Thick disfigured discolored nails with subungual debris  from hallux to fifth toes bilaterally. No evidence of bacterial infection or drainage bilaterally.  Orthopedic  No limitations of motion  feet .  No crepitus or effusions noted.  No bony pathology or digital deformities noted.  Skin  normotropic skin with no porokeratosis noted bilaterally.  No signs of infections or ulcers noted.     Onychomycosis  Pain in right toes  Pain in left toes  Consent was obtained for treatment procedures.   Mechanical debridement of nails 1-5  bilaterally performed with a nail nipper.  Filed with dremel without incident.  Self avulsed   3   toenail right foot with no evidence of infection.   Return office visit   3 months                   Told patient to return for periodic foot care and evaluation due to potential at risk complications.   [name] DPM

## 2020-07-02 ENCOUNTER — Ambulatory Visit: Payer: Medicare Other

## 2020-07-02 ENCOUNTER — Other Ambulatory Visit: Payer: Self-pay

## 2020-07-02 DIAGNOSIS — R29898 Other symptoms and signs involving the musculoskeletal system: Secondary | ICD-10-CM

## 2020-07-02 DIAGNOSIS — R2681 Unsteadiness on feet: Secondary | ICD-10-CM

## 2020-07-02 DIAGNOSIS — R2689 Other abnormalities of gait and mobility: Secondary | ICD-10-CM | POA: Diagnosis not present

## 2020-07-02 DIAGNOSIS — M6281 Muscle weakness (generalized): Secondary | ICD-10-CM

## 2020-07-02 DIAGNOSIS — G8929 Other chronic pain: Secondary | ICD-10-CM

## 2020-07-02 DIAGNOSIS — M25561 Pain in right knee: Secondary | ICD-10-CM | POA: Diagnosis not present

## 2020-07-02 NOTE — Therapy (Signed)
[facility] [address]  [place], [place], [address] Phone: [phone]   Fax:  [phone]  Physical Therapy Treatment  Patient Details  Name: Jose Snyder MRN: [id number] Date of Birth: 06-05-1940 Referring Provider (PT): Clearance Coots, MD   Encounter Date: 07/02/2020   PT End of Session - 07/02/20 1156    Visit Number 3    Number of Visits 13    Date for PT Re-Evaluation 08/04/20    Authorization Type Medicare & BCBS    PT Start Time 1102    PT Stop Time 1142    PT Time Calculation (min) 40 min    Activity Tolerance Patient tolerated treatment well    Behavior During Therapy [facility] for tasks assessed/performed           Past Medical History:  Diagnosis Date  . Anxiety   . Arthritis    R hand- OA  . Atrial Fibrillation  06/25/2013  . CAD (coronary artery disease), native coronary artery 06/28/2013   a. LHC (06/27/13):  Mid LAD 40%, mid D1 75%, mid CFX 50-60%, mid RCA 50%, EF 20%, LVEDP 24 mmHg.  . CHF (congestive heart failure) ([place])   . Chronic combined systolic and diastolic heart failure ([place]) 06/26/2013   a.  Echo (06/25/13):  EF 20-25%, diff HK, restrictive physio, mild MR, mod to severe LAE, mild RVE, mildly reduced RVSF, mod to severe RAE  . Chronic kidney disease   . Dyslipidemia 07/15/2013  . Dysrhythmia    a-fib  . GERD (gastroesophageal reflux disease)   . Hypertension   . Incidental pulmonary nodule 06/22/2014   Seen on CT angiogram of chest  . NICM (nonischemic cardiomyopathy) with EF 20-25% 06/26/2013  . Obesity   . Penetrating atherosclerotic ulcer of aorta ([place]) 10/22/2013   Small penetrating ulcer of descending thoracic aorta discovered on routine CTA  . Prostate cancer ([place])    watchful waiting, no treatment so far  . S/P Maze operation for atrial fibrillation 10/29/2013   Complete bilateral atrial lesion set using cryothermy and bipolar radiofrequency ablation with clipping of LA appendage via  right mini thoracotomy  . Shortness of breath    seen by Stanford Breed, & low energy     Past Surgical History:  Procedure Laterality Date  . CARDIAC CATHETERIZATION  06/2013  . CARDIOVERSION N/A 06/30/2013   Procedure: CARDIOVERSION;  Surgeon: [name], MD;  Location: [place];  Service: Cardiovascular;  Laterality: N/A;  . CARDIOVERSION N/A 07/31/2013   Procedure: CARDIOVERSION;  Surgeon: Thayer Headings, MD;  Location: [place];  Service: Cardiovascular;  Laterality: N/A;  . CARDIOVERSION N/A 03/19/2018   Procedure: CARDIOVERSION;  Surgeon: [name], MD;  Location: [facility] ENDOSCOPY;  Service: Cardiovascular;  Laterality: N/A;  . CARDIOVERSION N/A 07/24/2018   Procedure: CARDIOVERSION;  Surgeon: [name], MD;  Location: [facility] ENDOSCOPY;  Service: Cardiovascular;  Laterality: N/A;  . CLIPPING OF ATRIAL APPENDAGE  10/29/2013   Procedure: CLIPPING OF ATRIAL APPENDAGE;  Surgeon: Rexene Alberts, MD;  Location: [place];  Service: Open Heart Surgery;;  . INTRAOPERATIVE TRANSESOPHAGEAL ECHOCARDIOGRAM N/A 10/29/2013   Procedure: INTRAOPERATIVE TRANSESOPHAGEAL ECHOCARDIOGRAM;  Surgeon: Rexene Alberts, MD;  Location: [place];  Service: Open Heart Surgery;  Laterality: N/A;  . LEFT AND RIGHT HEART CATHETERIZATION WITH CORONARY ANGIOGRAM N/A 06/27/2013   Procedure: LEFT AND RIGHT HEART CATHETERIZATION WITH CORONARY ANGIOGRAM;  Surgeon: Jettie Booze, MD;  Location: [facility] CATH LAB;  Service: Cardiovascular;  Laterality: N/A;  . MEDIAL PARTIAL KNEE REPLACEMENT Left 1990's?  Marland Kitchen MINIMALLY INVASIVE MAZE PROCEDURE N/A 10/29/2013   Procedure: MINIMALLY INVASIVE MAZE PROCEDURE;  Surgeon: Rexene Alberts, MD;  Location: [place];  Service: Open Heart Surgery;  Laterality: N/A;  . TEE WITHOUT CARDIOVERSION N/A 06/30/2013   Procedure: TRANSESOPHAGEAL ECHOCARDIOGRAM (TEE) ;  Surgeon: [name], MD;  Location: [facility] ENDOSCOPY;  Service: Cardiovascular;  Laterality: N/A;  . TRANSURETHRAL RESECTION OF PROSTATE  2010     There were no vitals filed for this visit.   Subjective Assessment - 07/02/20 1107    Subjective Pt reports feeling bad today with a grumbling stomach, fatigue, and increased R knee pain. "I'm moving slow today".    Pertinent History prostate CA with resection 2010, HTN, GERD, HLD, CKD, chronic systolic/diastolic HF, CHF, CAD, a-fib, anxiety, L medial partial knee arthroplasty 1990's    Diagnostic tests 06/19/20 R knee MRI: Radial tear involving the posterior horn of the medial meniscus, probably remote large osteochondral lesion of medial femoral condyle    Patient Stated Goals decrease pain and increase strength    Currently in Pain? Yes    Pain Score 6     Pain Location Knee    Pain Orientation Right;Medial    Pain Descriptors / Indicators Dull                             [facility] Adult PT Treatment/Exercise - 07/02/20 0001      Knee/Hip Exercises: Aerobic   Recumbent Bike L2x5 min      Knee/Hip Exercises: Standing   Heel Raises Both;1 set;10 reps    Knee Flexion Both;1 set;10 reps    Hip Flexion --    Hip Extension --      Knee/Hip Exercises: Seated   Long Arc Quad AROM;Right;1 set;10 reps    Long Arc Quad Weight 3 lbs.    Long Arc [employer] Limitations 3"    Ball Squeeze 10x5"    Marching Strengthening;Both;1 set;20 reps;Weights    Marching Weights 3 lbs.    Hamstring Curl Strengthening;Right;1 set;10 reps    Hamstring Limitations GTband    Sit to Sand 10 reps;without UE support   eccentric control     Knee/Hip Exercises: Supine   Straight Leg Raises Right;1 set;10 reps    Straight Leg Raises Limitations VCs to keep low back down    Other Supine Knee/Hip Exercises H/L clams with PPT and GTB above knees   PT hand under pt's low back for tactile feedback to PPT                   PT Short Term Goals - 06/28/20 1209      PT SHORT TERM GOAL #1   Title Patient to be independent with initial HEP.    Time 3    Period Weeks    Status On-going     Target Date 07/14/20             PT Long Term Goals - 06/28/20 1209      PT LONG TERM GOAL #1   Title Patient to be independent with advanced HEP.    Time 6    Period Weeks    Status On-going      PT LONG TERM GOAL #2   Title Patient to demonstrate B LE strength >/=4+/5.    Time 6    Period Weeks    Status On-going  PT LONG TERM GOAL #3   Title Patient to demonstrate R knee AROM/PROM 0-120 degrees.    Time 6    Period Weeks    Status On-going      PT LONG TERM GOAL #4   Title Patient to score >/=48/56 on Berg in order to decrease risk of falls.    Time 6    Period Weeks    Status On-going      PT LONG TERM GOAL #5   Title Patient to report 85% improvement in balance confidence with shower transfers.    Time 6    Period Weeks    Status On-going      PT LONG TERM GOAL #6   Title Patient to ambulate atleast 1235ft with 6 minute walk test with 4WW in order to improve functional activity tolerance and endurance.    Time 6    Period Weeks    Status On-going                 Plan - 07/02/20 1156    Clinical Impression Statement Pt presents not feeling well today, but despite that tolerated session well. Added more LE strengthening in supine and increased LE weight with seated exercise. Pt able to perform sit <> Stand from low table without UE support or rollator in standing. Initiated standing ther ex at treadmill using rail for stability. Pt took one standing rest break between sides secondary to fatigue and SOB. Educated on positioning rollator closer before performing transfers. Pt carried over locking breaks before standing or sitting.    Personal Factors and Comorbidities Age;Comorbidity 3+;Fitness;Past/Current Experience;Time since onset of injury/illness/exacerbation    Comorbidities prostate CA with resection 2010, HTN, GERD, HLD, CKD, chronic systolic/diastolic HF, CHF, CAD, a-fib, anxiety, L medial partial knee arthroplasty 1990's    PT Frequency 2x  / week    PT Duration 6 weeks    PT Treatment/Interventions ADLs/Self Care Home Management;Cryotherapy;Electrical Stimulation;Iontophoresis 4mg /ml Dexamethasone;Moist Heat;Balance training;Therapeutic exercise;Therapeutic activities;Functional mobility training;Stair training;Gait training;Ultrasound;Neuromuscular re-education;Patient/family education;Manual techniques;Vestibular;Vasopneumatic Device;Taping;Energy conservation;Dry needling;Passive range of motion    PT Next Visit Plan knee FOTO;reassess HEP; progress LE strengthening as tolerated    Consulted and Agree with Plan of Care Patient           Patient will benefit from skilled therapeutic intervention in order to improve the following deficits and impairments:  Abnormal gait,Decreased endurance,Increased edema,Pain,Increased fascial restricitons,Decreased strength,Decreased activity tolerance,Decreased balance,Difficulty walking,Improper body mechanics,Decreased range of motion,Impaired flexibility,Postural dysfunction  Visit Diagnosis: Chronic pain of right knee  Muscle weakness (generalized)  Unsteadiness on feet  Other symptoms and signs involving the musculoskeletal system  Other abnormalities of gait and mobility     Problem List Patient Active Problem List   Diagnosis Date Noted  . Abscess or cellulitis of gluteal region 06/30/2020  . Carcinoma of prostate ([place]) 06/30/2020  . Congestive heart failure ([place]) 06/30/2020  . Other dyspnea and respiratory abnormality 06/30/2020  . Other psoriasis 06/30/2020  . Other specified counseling 06/30/2020  . 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 OA (osteoarthritis) of knee 01/08/2020  . Atypical atrial flutter ([place])   . Incidental pulmonary nodule 06/22/2014  . Chronic kidney disease   . S/P Maze operation for atrial fibrillation 10/29/2013  . Penetrating atherosclerotic ulcer of aorta ([place]) 10/22/2013  . Chronic atrial fibrillation ([place]) 09/24/2013  . Dyslipidemia  07/15/2013  . CAD (coronary artery disease), native coronary artery 06/28/2013  . Dyspnea on exertion 06/26/2013  . History of prostate cancer 06/26/2013  .  Chronic combined systolic and diastolic heart failure ([place]) 06/26/2013  . NICM (nonischemic cardiomyopathy) with EF 20-25% 06/26/2013    Class: Diagnosis of  . Atrial Fibrillation  06/25/2013  . HTN (hypertension) 06/25/2013    Izell [place], PT, DPT 07/02/2020, 12:00 PM  [facility] [place], [place], [address] Phone: [phone]   Fax:  [phone]  Name: Rishab Stoudt MRN: [id number] Date of Birth: 1940-12-23

## 2020-07-05 ENCOUNTER — Telehealth: Payer: Self-pay | Admitting: Family Medicine

## 2020-07-05 ENCOUNTER — Other Ambulatory Visit: Payer: Self-pay

## 2020-07-05 ENCOUNTER — Ambulatory Visit: Payer: Medicare Other

## 2020-07-05 DIAGNOSIS — G8929 Other chronic pain: Secondary | ICD-10-CM | POA: Diagnosis not present

## 2020-07-05 DIAGNOSIS — R29898 Other symptoms and signs involving the musculoskeletal system: Secondary | ICD-10-CM | POA: Diagnosis not present

## 2020-07-05 DIAGNOSIS — M25561 Pain in right knee: Secondary | ICD-10-CM

## 2020-07-05 DIAGNOSIS — M6281 Muscle weakness (generalized): Secondary | ICD-10-CM

## 2020-07-05 DIAGNOSIS — R2681 Unsteadiness on feet: Secondary | ICD-10-CM | POA: Diagnosis not present

## 2020-07-05 DIAGNOSIS — M84453A Pathological fracture, unspecified femur, initial encounter for fracture: Secondary | ICD-10-CM

## 2020-07-05 DIAGNOSIS — R2689 Other abnormalities of gait and mobility: Secondary | ICD-10-CM

## 2020-07-05 MED ORDER — HYDROCODONE-ACETAMINOPHEN 5-325 MG PO TABS: 1.0000 | ORAL_TABLET | Freq: Three times a day (TID) | ORAL | 0 refills | Status: DC | PRN

## 2020-07-05 NOTE — Telephone Encounter (Signed)
Refilled norco.   Rosemarie Ax, MD [facility] 07/05/2020, 11:59 AM

## 2020-07-05 NOTE — Therapy (Signed)
[facility] [address]  [place], [place], [address] Phone: [phone]   Fax:  [phone]  Physical Therapy Treatment  Patient Details  Name: Jose Snyder MRN: [id number] Date of Birth: 02-Apr-1940 Referring Provider (PT): Clearance Coots, MD   Encounter Date: 07/05/2020   PT End of Session - 07/05/20 1145    Visit Number 4    Number of Visits 13    Date for PT Re-Evaluation 08/04/20    Authorization Type Medicare & BCBS    PT Start Time 1054    PT Stop Time 1137    PT Time Calculation (min) 43 min    Activity Tolerance Patient tolerated treatment well    Behavior During Therapy [facility] for tasks assessed/performed           Past Medical History:  Diagnosis Date  . Anxiety   . Arthritis    R hand- OA  . Atrial Fibrillation  06/25/2013  . CAD (coronary artery disease), native coronary artery 06/28/2013   a. LHC (06/27/13):  Mid LAD 40%, mid D1 75%, mid CFX 50-60%, mid RCA 50%, EF 20%, LVEDP 24 mmHg.  . CHF (congestive heart failure) ([place])   . Chronic combined systolic and diastolic heart failure ([place]) 06/26/2013   a.  Echo (06/25/13):  EF 20-25%, diff HK, restrictive physio, mild MR, mod to severe LAE, mild RVE, mildly reduced RVSF, mod to severe RAE  . Chronic kidney disease   . Dyslipidemia 07/15/2013  . Dysrhythmia    a-fib  . GERD (gastroesophageal reflux disease)   . Hypertension   . Incidental pulmonary nodule 06/22/2014   Seen on CT angiogram of chest  . NICM (nonischemic cardiomyopathy) with EF 20-25% 06/26/2013  . Obesity   . Penetrating atherosclerotic ulcer of aorta ([place]) 10/22/2013   Small penetrating ulcer of descending thoracic aorta discovered on routine CTA  . Prostate cancer ([place])    watchful waiting, no treatment so far  . S/P Maze operation for atrial fibrillation 10/29/2013   Complete bilateral atrial lesion set using cryothermy and bipolar radiofrequency ablation with clipping of LA appendage via  right mini thoracotomy  . Shortness of breath    seen by Stanford Breed, & low energy     Past Surgical History:  Procedure Laterality Date  . CARDIAC CATHETERIZATION  06/2013  . CARDIOVERSION N/A 06/30/2013   Procedure: CARDIOVERSION;  Surgeon: [name], MD;  Location: [place];  Service: Cardiovascular;  Laterality: N/A;  . CARDIOVERSION N/A 07/31/2013   Procedure: CARDIOVERSION;  Surgeon: Thayer Headings, MD;  Location: [place];  Service: Cardiovascular;  Laterality: N/A;  . CARDIOVERSION N/A 03/19/2018   Procedure: CARDIOVERSION;  Surgeon: [name], MD;  Location: [facility] ENDOSCOPY;  Service: Cardiovascular;  Laterality: N/A;  . CARDIOVERSION N/A 07/24/2018   Procedure: CARDIOVERSION;  Surgeon: [name], MD;  Location: [facility] ENDOSCOPY;  Service: Cardiovascular;  Laterality: N/A;  . CLIPPING OF ATRIAL APPENDAGE  10/29/2013   Procedure: CLIPPING OF ATRIAL APPENDAGE;  Surgeon: Rexene Alberts, MD;  Location: [place];  Service: Open Heart Surgery;;  . INTRAOPERATIVE TRANSESOPHAGEAL ECHOCARDIOGRAM N/A 10/29/2013   Procedure: INTRAOPERATIVE TRANSESOPHAGEAL ECHOCARDIOGRAM;  Surgeon: Rexene Alberts, MD;  Location: [place];  Service: Open Heart Surgery;  Laterality: N/A;  . LEFT AND RIGHT HEART CATHETERIZATION WITH CORONARY ANGIOGRAM N/A 06/27/2013   Procedure: LEFT AND RIGHT HEART CATHETERIZATION WITH CORONARY ANGIOGRAM;  Surgeon: Jettie Booze, MD;  Location: [facility] CATH LAB;  Service: Cardiovascular;  Laterality: N/A;  . MEDIAL PARTIAL KNEE REPLACEMENT Left 1990's?  Mar[place] Kitchen MINIMALLY INVASIVE MAZE PROCEDURE N/A 10/29/2013   Procedure: MINIMALLY INVASIVE MAZE PROCEDURE;  Surgeon: Rexene Alberts, MD;  Location: [place];  Service: Open Heart Surgery;  Laterality: N/A;  . TEE WITHOUT CARDIOVERSION N/A 06/30/2013   Procedure: TRANSESOPHAGEAL ECHOCARDIOGRAM (TEE) ;  Surgeon: [name], MD;  Location: [facility] ENDOSCOPY;  Service: Cardiovascular;  Laterality: N/A;  . TRANSURETHRAL RESECTION OF PROSTATE  2010     There were no vitals filed for this visit.   Subjective Assessment - 07/05/20 1050    Subjective Pt reports being tired today from household activites yesterday and this morning.    Pertinent History prostate CA with resection 2010, HTN, GERD, HLD, CKD, chronic systolic/diastolic HF, CHF, CAD, a-fib, anxiety, L medial partial knee arthroplasty 1990's    Diagnostic tests 06/19/20 R knee MRI: Radial tear involving the posterior horn of the medial meniscus, probably remote large osteochondral lesion of medial femoral condyle    Patient Stated Goals decrease pain and increase strength    Currently in Pain? Yes    Pain Score 4     Pain Location Knee    Pain Orientation Right;Medial    Pain Descriptors / Indicators Dull    Pain Type Chronic pain              [facility] PT Assessment - 07/05/20 0001      Observation/Other Assessments   Focus on Therapeutic Outcomes (FOTO)  Knee: 47, Predicted score: 62                         [facility] Adult PT Treatment/Exercise - 07/05/20 0001      High Level Balance   High Level Balance Comments alt toe taps to 8' step 2x10; cues to remain upright and avoid leaning to R side      Knee/Hip Exercises: Aerobic   Recumbent Bike L2x34min      Knee/Hip Exercises: Standing   Hip Flexion Stengthening;Both;1 set;10 reps;Knee bent    Hip Flexion Limitations counter support    Hip Abduction Stengthening;Both;1 set;10 reps;Knee straight    Abduction Limitations counter support    Hip Extension Stengthening;Both;1 set;10 reps;Knee straight    Extension Limitations counter support    Other Standing Knee Exercises hamstring curl 3# weights  10 reps each LE; elevated mat table for support      Knee/Hip Exercises: Seated   Long Arc Quad Strengthening;Both;1 set;10 reps;Weights    Long Arc Quad Weight 3 lbs.    Sit to Sand 10 reps;without UE support                    PT Short Term Goals - 06/28/20 1209      PT SHORT TERM GOAL #1    Title Patient to be independent with initial HEP.    Time 3    Period Weeks    Status On-going    Target Date 07/14/20             PT Long Term Goals - 06/28/20 1209      PT LONG TERM GOAL #1   Title Patient to be independent with advanced HEP.    Time 6    Period Weeks    Status On-going      PT LONG TERM GOAL #2   Title Patient to demonstrate B LE strength >/=4+/5.    Time 6    Period  Weeks    Status On-going      PT LONG TERM GOAL #3   Title Patient to demonstrate R knee AROM/PROM 0-120 degrees.    Time 6    Period Weeks    Status On-going      PT LONG TERM GOAL #4   Title Patient to score >/=48/56 on Berg in order to decrease risk of falls.    Time 6    Period Weeks    Status On-going      PT LONG TERM GOAL #5   Title Patient to report 85% improvement in balance confidence with shower transfers.    Time 6    Period Weeks    Status On-going      PT LONG TERM GOAL #6   Title Patient to ambulate atleast 1261ft with 6 minute walk test with 4WW in order to improve functional activity tolerance and endurance.    Time 6    Period Weeks    Status On-going                 Plan - 07/05/20 1147    Clinical Impression Statement Pt came in reporting increase fatigue from doing household activities yesterday and this morning. He notes improvements with his ability to move around w/o pain in the R knee from the exercises. Focused more on standing ther ex emphasizing exercises to facilitate SL stance. Cues required during the toe taps to avoid leaning to the R when stepping up with the L LE. Only one seated break was required d/t SOB from ther ex. He was able to do LAQ w/ simultanueous hip flexion indicating increased rectus femoris strength. Added STS to HEP to increase functional mobility and LE strength.    Personal Factors and Comorbidities Age;Comorbidity 3+;Fitness;Past/Current Experience;Time since onset of injury/illness/exacerbation    Comorbidities prostate  CA with resection 2010, HTN, GERD, HLD, CKD, chronic systolic/diastolic HF, CHF, CAD, a-fib, anxiety, L medial partial knee arthroplasty 1990's    PT Frequency 2x / week    PT Duration 6 weeks    PT Treatment/Interventions ADLs/Self Care Home Management;Cryotherapy;Electrical Stimulation;Iontophoresis 4mg /ml Dexamethasone;Moist Heat;Balance training;Therapeutic exercise;Therapeutic activities;Functional mobility training;Stair training;Gait training;Ultrasound;Neuromuscular re-education;Patient/family education;Manual techniques;Vestibular;Vasopneumatic Device;Taping;Energy conservation;Dry needling;Passive range of motion    PT Next Visit Plan progress LE strengthening as tolerated; Balance training working on [employer] and Agree with Plan of Care Patient           Patient will benefit from skilled therapeutic intervention in order to improve the following deficits and impairments:  Abnormal gait,Decreased endurance,Increased edema,Pain,Increased fascial restricitons,Decreased strength,Decreased activity tolerance,Decreased balance,Difficulty walking,Improper body mechanics,Decreased range of motion,Impaired flexibility,Postural dysfunction  Visit Diagnosis: Chronic pain of right knee  Muscle weakness (generalized)  Unsteadiness on feet  Other symptoms and signs involving the musculoskeletal system  Other abnormalities of gait and mobility     Problem List Patient Active Problem List   Diagnosis Date Noted  . Abscess or cellulitis of gluteal region 06/30/2020  . Carcinoma of prostate ([place]) 06/30/2020  . Congestive heart failure ([place]) 06/30/2020  . Other dyspnea and respiratory abnormality 06/30/2020  . Other psoriasis 06/30/2020  . Other specified counseling 06/30/2020  . 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 OA (osteoarthritis) of knee 01/08/2020  . Atypical atrial flutter ([place])   . Incidental pulmonary nodule 06/22/2014  . Chronic kidney disease   . S/P Maze operation  for atrial fibrillation 10/29/2013  . Penetrating atherosclerotic ulcer of aorta ([employer]) 10/22/2013  . Chronic atrial fibrillation (  [place]) 09/24/2013  . Dyslipidemia 07/15/2013  . CAD (coronary artery disease), native coronary artery 06/28/2013  . Dyspnea on exertion 06/26/2013  . History of prostate cancer 06/26/2013  . Chronic combined systolic and diastolic heart failure ([place]) 06/26/2013  . NICM (nonischemic cardiomyopathy) with EF 20-25% 06/26/2013    Class: Diagnosis of  . Atrial Fibrillation  06/25/2013  . HTN (hypertension) 06/25/2013    Artist Pais, PTA 07/05/2020, 11:55 AM  [facility] [address]  [place], [place], [address] Phone: [phone]   Fax:  [phone]  Name: Jose Snyder MRN: [id number] Date of Birth: 1940-03-02

## 2020-07-05 NOTE — Telephone Encounter (Signed)
Patient came by office states he is down to 2 pills & request provider call in refill order for :   HYDROcodone-acetaminophen (NORCO/VICODIN) 5-325 MG tablet [[id number]]   Order Details Dose: 1 tablet Route: Oral Frequency: Every 8 hours PRN  Dispense Quantity: 15 tablet Refills: 0       Sig: Take 1 tablet by mouth every 8 (eight) hours as needed   Pt uses :  [facility] [place] - [address]  [address] [place], [place]  [address]  Phone:  [phone] Fax:  [phone]

## 2020-07-08 ENCOUNTER — Other Ambulatory Visit: Payer: Self-pay | Admitting: Cardiology

## 2020-07-08 ENCOUNTER — Ambulatory Visit: Payer: Medicare Other

## 2020-07-08 ENCOUNTER — Other Ambulatory Visit: Payer: Self-pay

## 2020-07-08 DIAGNOSIS — G8929 Other chronic pain: Secondary | ICD-10-CM

## 2020-07-08 DIAGNOSIS — M6281 Muscle weakness (generalized): Secondary | ICD-10-CM | POA: Diagnosis not present

## 2020-07-08 DIAGNOSIS — I480 Paroxysmal atrial fibrillation: Secondary | ICD-10-CM

## 2020-07-08 DIAGNOSIS — R2689 Other abnormalities of gait and mobility: Secondary | ICD-10-CM

## 2020-07-08 DIAGNOSIS — R29898 Other symptoms and signs involving the musculoskeletal system: Secondary | ICD-10-CM | POA: Diagnosis not present

## 2020-07-08 DIAGNOSIS — M25561 Pain in right knee: Secondary | ICD-10-CM | POA: Diagnosis not present

## 2020-07-08 DIAGNOSIS — R2681 Unsteadiness on feet: Secondary | ICD-10-CM

## 2020-07-08 NOTE — Therapy (Signed)
[facility] [address]  [place], [place], [address] Phone: [phone]   Fax:  [phone]  Physical Therapy Treatment  Patient Details  Name: Jose Snyder MRN: [id number] Date of Birth: 22-Oct-1940 Referring Provider (PT): Clearance Coots, MD   Encounter Date: 07/08/2020   PT End of Session - 07/08/20 1059    Visit Number 5    Number of Visits 13    Date for PT Re-Evaluation 08/04/20    Authorization Type Medicare & BCBS    PT Start Time 1010    PT Stop Time 1056    PT Time Calculation (min) 46 min    Activity Tolerance Patient tolerated treatment well    Behavior During Therapy [facility] for tasks assessed/performed           Past Medical History:  Diagnosis Date  . Anxiety   . Arthritis    R hand- OA  . Atrial Fibrillation  06/25/2013  . CAD (coronary artery disease), native coronary artery 06/28/2013   a. LHC (06/27/13):  Mid LAD 40%, mid D1 75%, mid CFX 50-60%, mid RCA 50%, EF 20%, LVEDP 24 mmHg.  . CHF (congestive heart failure) ([place])   . Chronic combined systolic and diastolic heart failure ([place]) 06/26/2013   a.  Echo (06/25/13):  EF 20-25%, diff HK, restrictive physio, mild MR, mod to severe LAE, mild RVE, mildly reduced RVSF, mod to severe RAE  . Chronic kidney disease   . Dyslipidemia 07/15/2013  . Dysrhythmia    a-fib  . GERD (gastroesophageal reflux disease)   . Hypertension   . Incidental pulmonary nodule 06/22/2014   Seen on CT angiogram of chest  . NICM (nonischemic cardiomyopathy) with EF 20-25% 06/26/2013  . Obesity   . Penetrating atherosclerotic ulcer of aorta ([place]) 10/22/2013   Small penetrating ulcer of descending thoracic aorta discovered on routine CTA  . Prostate cancer ([place])    watchful waiting, no treatment so far  . S/P Maze operation for atrial fibrillation 10/29/2013   Complete bilateral atrial lesion set using cryothermy and bipolar radiofrequency ablation with clipping of LA appendage via  right mini thoracotomy  . Shortness of breath    seen by Stanford Breed, & low energy     Past Surgical History:  Procedure Laterality Date  . CARDIAC CATHETERIZATION  06/2013  . CARDIOVERSION N/A 06/30/2013   Procedure: CARDIOVERSION;  Surgeon: [name], MD;  Location: [place];  Service: Cardiovascular;  Laterality: N/A;  . CARDIOVERSION N/A 07/31/2013   Procedure: CARDIOVERSION;  Surgeon: Thayer Headings, MD;  Location: [place];  Service: Cardiovascular;  Laterality: N/A;  . CARDIOVERSION N/A 03/19/2018   Procedure: CARDIOVERSION;  Surgeon: [name], MD;  Location: [facility] ENDOSCOPY;  Service: Cardiovascular;  Laterality: N/A;  . CARDIOVERSION N/A 07/24/2018   Procedure: CARDIOVERSION;  Surgeon: [name], MD;  Location: [facility] ENDOSCOPY;  Service: Cardiovascular;  Laterality: N/A;  . CLIPPING OF ATRIAL APPENDAGE  10/29/2013   Procedure: CLIPPING OF ATRIAL APPENDAGE;  Surgeon: Rexene Alberts, MD;  Location: [place];  Service: Open Heart Surgery;;  . INTRAOPERATIVE TRANSESOPHAGEAL ECHOCARDIOGRAM N/A 10/29/2013   Procedure: INTRAOPERATIVE TRANSESOPHAGEAL ECHOCARDIOGRAM;  Surgeon: Rexene Alberts, MD;  Location: [place];  Service: Open Heart Surgery;  Laterality: N/A;  . LEFT AND RIGHT HEART CATHETERIZATION WITH CORONARY ANGIOGRAM N/A 06/27/2013   Procedure: LEFT AND RIGHT HEART CATHETERIZATION WITH CORONARY ANGIOGRAM;  Surgeon: Jettie Booze, MD;  Location: [facility] CATH LAB;  Service: Cardiovascular;  Laterality: N/A;  . MEDIAL PARTIAL KNEE REPLACEMENT Left 1990's?  Marland Kitchen MINIMALLY INVASIVE MAZE PROCEDURE N/A 10/29/2013   Procedure: MINIMALLY INVASIVE MAZE PROCEDURE;  Surgeon: Rexene Alberts, MD;  Location: [place];  Service: Open Heart Surgery;  Laterality: N/A;  . TEE WITHOUT CARDIOVERSION N/A 06/30/2013   Procedure: TRANSESOPHAGEAL ECHOCARDIOGRAM (TEE) ;  Surgeon: [name], MD;  Location: [facility] ENDOSCOPY;  Service: Cardiovascular;  Laterality: N/A;  . TRANSURETHRAL RESECTION OF PROSTATE  2010     There were no vitals filed for this visit.   Subjective Assessment - 07/08/20 1013    Subjective Pt reports that he is still tired coming in, needs assistance with STS exercises at home.    Pertinent History prostate CA with resection 2010, HTN, GERD, HLD, CKD, chronic systolic/diastolic HF, CHF, CAD, a-fib, anxiety, L medial partial knee arthroplasty 1990's    Diagnostic tests 06/19/20 R knee MRI: Radial tear involving the posterior horn of the medial meniscus, probably remote large osteochondral lesion of medial femoral condyle    Patient Stated Goals decrease pain and increase strength    Currently in Pain? Yes    Pain Score 4     Pain Location Knee    Pain Orientation Right;Medial;Mid    Pain Descriptors / Indicators Dull    Pain Type Chronic pain              [facility] PT Assessment - 07/08/20 0001      AROM   Right Knee Extension 2    Right Knee Flexion 121   in sitting     PROM   Right Knee Extension 1    Right Knee Flexion 126   in sitting                        [facility] Adult PT Treatment/Exercise - 07/08/20 0001      High Level Balance   High Level Balance Comments alt toe taps to cones R/L side with counter support 2x5 with each leg, with increase in distance between cones on second trial      Exercises   Exercises Knee/Hip      Knee/Hip Exercises: Aerobic   Nustep L4x58mn      Knee/Hip Exercises: Machines for Strengthening   Cybex Knee Extension 10# 2x10 reps B LE    Cybex Knee Flexion 20# 2x10 reps B LE      Knee/Hip Exercises: Standing   Forward Step Up Both;2 sets;10 reps;Hand Hold: 2;Step Height: 6"    Forward Step Up Limitations counter support                  PT Education - 07/08/20 1023    Education Details Review of STS, adjusting feet positioning to parallel and decreasing fwd WS for safety and easier completion of STS.    Person(s) Educated Patient    Methods Explanation;Demonstration;Verbal cues    Comprehension  Verbalized understanding;Returned demonstration            PT Short Term Goals - 07/08/20 1058      PT SHORT TERM GOAL #1   Title Patient to be independent with initial HEP.    Time 3    Period Weeks    Status Partially Met   cues required to correctly perform exercises but reporting compliance   Target Date 07/14/20             PT Long Term Goals - 07/08/20 1053  PT LONG TERM GOAL #1   Title Patient to be independent with advanced HEP.    Time 6    Period Weeks    Status On-going      PT LONG TERM GOAL #2   Title Patient to demonstrate B LE strength >/=4+/5.    Time 6    Period Weeks    Status On-going      PT LONG TERM GOAL #3   Title Patient to demonstrate R knee AROM/PROM 0-120 degrees.    Time 6    Period Weeks    Status Partially Met   R knee extension lacking     PT LONG TERM GOAL #4   Title Patient to score >/=48/56 on Berg in order to decrease risk of falls.    Time 6    Period Weeks    Status On-going      PT LONG TERM GOAL #5   Title Patient to report 85% improvement in balance confidence with shower transfers.    Time 6    Period Weeks    Status On-going      PT LONG TERM GOAL #6   Title Patient to ambulate atleast 1246ft with 6 minute walk test with 4WW in order to improve functional activity tolerance and endurance.    Time 6    Period Weeks    Status On-going                 Plan - 07/08/20 1100    Clinical Impression Statement Pt reports continued fatigue from standing on his feet, as a result he has not been doing a lot in [place] positions at home. Tried steps ups today and noticed decreased TKE with the R LE. Also did alt toe taps onto cones, with pt reporting increaased difficulty shifting weight more onto R LE and mild pain but tolerable. Reviewed STS form per pt request, adjusted his foot position keeping them parallel as he tends to keep his R LE further behind and decreased fwd WS d/t him reporting feeling like he is going  to fall fwd. He is showing R knee AROM/PROM past 120 dg in flexion but still lacks 2 deg of TKE. Pt is progressing toward goals and will continue to benefit from LE strength and SL balance activites.    Personal Factors and Comorbidities Age;Comorbidity 3+;Fitness;Past/Current Experience;Time since onset of injury/illness/exacerbation    PT Frequency 2x / week    PT Duration 6 weeks    PT Treatment/Interventions ADLs/Self Care Home Management;Cryotherapy;Electrical Stimulation;Iontophoresis 4mg /ml Dexamethasone;Moist Heat;Balance training;Therapeutic exercise;Therapeutic activities;Functional mobility training;Stair training;Gait training;Ultrasound;Neuromuscular re-education;Patient/family education;Manual techniques;Vestibular;Vasopneumatic Device;Taping;Energy conservation;Dry needling;Passive range of motion    PT Next Visit Plan progress LE strengthening as tolerated; Balance training working on [employer] and Agree with Plan of Care Patient           Patient will benefit from skilled therapeutic intervention in order to improve the following deficits and impairments:  Abnormal gait,Decreased endurance,Increased edema,Pain,Increased fascial restricitons,Decreased strength,Decreased activity tolerance,Decreased balance,Difficulty walking,Improper body mechanics,Decreased range of motion,Impaired flexibility,Postural dysfunction  Visit Diagnosis: Chronic pain of right knee  Muscle weakness (generalized)  Unsteadiness on feet  Other symptoms and signs involving the musculoskeletal system  Other abnormalities of gait and mobility     Problem List Patient Active Problem List   Diagnosis Date Noted  . Abscess or cellulitis of gluteal region 06/30/2020  . Carcinoma of prostate ([place]) 06/30/2020  . Congestive heart failure ([place]) 06/30/2020  . Other dyspnea  and respiratory abnormality 06/30/2020  . Other psoriasis 06/30/2020  . Other specified counseling 06/30/2020  .  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 OA (osteoarthritis) of knee 01/08/2020  . Atypical atrial flutter ([place])   . Incidental pulmonary nodule 06/22/2014  . Chronic kidney disease   . S/P Maze operation for atrial fibrillation 10/29/2013  . Penetrating atherosclerotic ulcer of aorta ([place]) 10/22/2013  . Chronic atrial fibrillation ([place]) 09/24/2013  . Dyslipidemia 07/15/2013  . CAD (coronary artery disease), native coronary artery 06/28/2013  . Dyspnea on exertion 06/26/2013  . History of prostate cancer 06/26/2013  . Chronic combined systolic and diastolic heart failure ([place]) 06/26/2013  . NICM (nonischemic cardiomyopathy) with EF 20-25% 06/26/2013    Class: Diagnosis of  . Atrial Fibrillation  06/25/2013  . HTN (hypertension) 06/25/2013    Artist Pais, PTA 07/08/2020, 11:59 AM  [facility] [address]  [place], [place], [address] Phone: [phone]   Fax:  [phone]  Name: Jose Snyder MRN: [id number] Date of Birth: February 15, 1941

## 2020-07-12 ENCOUNTER — Ambulatory Visit: Payer: Medicare Other | Admitting: Physical Therapy

## 2020-07-12 ENCOUNTER — Other Ambulatory Visit: Payer: Self-pay

## 2020-07-12 ENCOUNTER — Encounter: Payer: Self-pay | Admitting: Physical Therapy

## 2020-07-12 DIAGNOSIS — R29898 Other symptoms and signs involving the musculoskeletal system: Secondary | ICD-10-CM | POA: Diagnosis not present

## 2020-07-12 DIAGNOSIS — G8929 Other chronic pain: Secondary | ICD-10-CM | POA: Diagnosis not present

## 2020-07-12 DIAGNOSIS — M25561 Pain in right knee: Secondary | ICD-10-CM | POA: Diagnosis not present

## 2020-07-12 DIAGNOSIS — R2689 Other abnormalities of gait and mobility: Secondary | ICD-10-CM | POA: Diagnosis not present

## 2020-07-12 DIAGNOSIS — R2681 Unsteadiness on feet: Secondary | ICD-10-CM

## 2020-07-12 DIAGNOSIS — M6281 Muscle weakness (generalized): Secondary | ICD-10-CM | POA: Diagnosis not present

## 2020-07-12 NOTE — Therapy (Signed)
[facility] [address]  [place], [place], [address] Phone: [phone]   Fax:  [phone]  Physical Therapy Treatment  Patient Details  Name: Jose Snyder MRN: [id number] Date of Birth: 04/21/1940 Referring Provider (PT): Clearance Coots, MD   Encounter Date: 07/12/2020   PT End of Session - 07/12/20 1104    Visit Number 6    Number of Visits 13    Date for PT Re-Evaluation 08/04/20    Authorization Type Medicare & BCBS    PT Start Time 1011    PT Stop Time 1059    PT Time Calculation (min) 48 min    Activity Tolerance Patient tolerated treatment well    Behavior During Therapy [facility] for tasks assessed/performed           Past Medical History:  Diagnosis Date  . Anxiety   . Arthritis    R hand- OA  . Atrial Fibrillation  06/25/2013  . CAD (coronary artery disease), native coronary artery 06/28/2013   a. LHC (06/27/13):  Mid LAD 40%, mid D1 75%, mid CFX 50-60%, mid RCA 50%, EF 20%, LVEDP 24 mmHg.  . CHF (congestive heart failure) ([place])   . Chronic combined systolic and diastolic heart failure ([place]) 06/26/2013   a.  Echo (06/25/13):  EF 20-25%, diff HK, restrictive physio, mild MR, mod to severe LAE, mild RVE, mildly reduced RVSF, mod to severe RAE  . Chronic kidney disease   . Dyslipidemia 07/15/2013  . Dysrhythmia    a-fib  . GERD (gastroesophageal reflux disease)   . Hypertension   . Incidental pulmonary nodule 06/22/2014   Seen on CT angiogram of chest  . NICM (nonischemic cardiomyopathy) with EF 20-25% 06/26/2013  . Obesity   . Penetrating atherosclerotic ulcer of aorta ([place]) 10/22/2013   Small penetrating ulcer of descending thoracic aorta discovered on routine CTA  . Prostate cancer ([place])    watchful waiting, no treatment so far  . S/P Maze operation for atrial fibrillation 10/29/2013   Complete bilateral atrial lesion set using cryothermy and bipolar radiofrequency ablation with clipping of LA appendage via  right mini thoracotomy  . Shortness of breath    seen by Stanford Breed, & low energy     Past Surgical History:  Procedure Laterality Date  . CARDIAC CATHETERIZATION  06/2013  . CARDIOVERSION N/A 06/30/2013   Procedure: CARDIOVERSION;  Surgeon: [name], MD;  Location: [place];  Service: Cardiovascular;  Laterality: N/A;  . CARDIOVERSION N/A 07/31/2013   Procedure: CARDIOVERSION;  Surgeon: Thayer Headings, MD;  Location: [place];  Service: Cardiovascular;  Laterality: N/A;  . CARDIOVERSION N/A 03/19/2018   Procedure: CARDIOVERSION;  Surgeon: [name], MD;  Location: [facility] ENDOSCOPY;  Service: Cardiovascular;  Laterality: N/A;  . CARDIOVERSION N/A 07/24/2018   Procedure: CARDIOVERSION;  Surgeon: [name], MD;  Location: [facility] ENDOSCOPY;  Service: Cardiovascular;  Laterality: N/A;  . CLIPPING OF ATRIAL APPENDAGE  10/29/2013   Procedure: CLIPPING OF ATRIAL APPENDAGE;  Surgeon: Rexene Alberts, MD;  Location: [place];  Service: Open Heart Surgery;;  . INTRAOPERATIVE TRANSESOPHAGEAL ECHOCARDIOGRAM N/A 10/29/2013   Procedure: INTRAOPERATIVE TRANSESOPHAGEAL ECHOCARDIOGRAM;  Surgeon: Rexene Alberts, MD;  Location: [place];  Service: Open Heart Surgery;  Laterality: N/A;  . LEFT AND RIGHT HEART CATHETERIZATION WITH CORONARY ANGIOGRAM N/A 06/27/2013   Procedure: LEFT AND RIGHT HEART CATHETERIZATION WITH CORONARY ANGIOGRAM;  Surgeon: Jettie Booze, MD;  Location: [facility] CATH LAB;  Service: Cardiovascular;  Laterality: N/A;  . MEDIAL PARTIAL KNEE REPLACEMENT Left 1990's?  Marland Kitchen MINIMALLY INVASIVE MAZE PROCEDURE N/A 10/29/2013   Procedure: MINIMALLY INVASIVE MAZE PROCEDURE;  Surgeon: Rexene Alberts, MD;  Location: [place];  Service: Open Heart Surgery;  Laterality: N/A;  . TEE WITHOUT CARDIOVERSION N/A 06/30/2013   Procedure: TRANSESOPHAGEAL ECHOCARDIOGRAM (TEE) ;  Surgeon: [name], MD;  Location: [facility] ENDOSCOPY;  Service: Cardiovascular;  Laterality: N/A;  . TRANSURETHRAL RESECTION OF PROSTATE  2010     There were no vitals filed for this visit.   Subjective Assessment - 07/12/20 1012    Subjective Had a "slow weekend"- felt fatigued. HEP is going well- no questions.    Pertinent History prostate CA with resection 2010, HTN, GERD, HLD, CKD, chronic systolic/diastolic HF, CHF, CAD, a-fib, anxiety, L medial partial knee arthroplasty 1990's    Diagnostic tests 06/19/20 R knee MRI: Radial tear involving the posterior horn of the medial meniscus, probably remote large osteochondral lesion of medial femoral condyle    Patient Stated Goals decrease pain and increase strength    Currently in Pain? Yes    Pain Score 5     Pain Location Knee    Pain Orientation Right    Pain Descriptors / Indicators Dull                             [facility] Adult PT Treatment/Exercise - 07/12/20 0001      Knee/Hip Exercises: Stretches   Hip Flexor Stretch Right;2 reps;30 seconds    Hip Flexor Stretch Limitations mod thomas with strap   heavy cueing to avoid pushing into pain     Knee/Hip Exercises: Aerobic   Nustep L3x73min      Knee/Hip Exercises: Standing   Knee Flexion Strengthening;Right;10 reps;2 sets    Knee Flexion Limitations HS curl with yellow loop around ankles   cueing to stand up tall   Terminal Knee Extension Strengthening;Right;1 set;10 reps    Terminal Knee Extension Limitations 10x3" with ball & walker support      Knee/Hip Exercises: Seated   Sit to Sand without UE support;15 reps   5x with red TB above knees, 5x with blue TB around trunk for R wt shift     Knee/Hip Exercises: Supine   Quad Sets Strengthening;Right;1 set;5 reps    Quad Sets Limitations 5x10" with towel roll under knee    Straight Leg Raises Right;1 set;10 reps    Straight Leg Raises Limitations 10x 0#, 2x5 2#   quad shaking d/t fatigue                 PT Education - 07/12/20 1102    Education Details update to HEP- educated on safety with TB    Person(s) Educated Patient    Methods  Explanation;Demonstration;Tactile cues;Verbal cues;Handout    Comprehension Verbalized understanding;Returned demonstration            PT Short Term Goals - 07/12/20 1108      PT SHORT TERM GOAL #1   Title Patient to be independent with initial HEP.    Time 3    Period Weeks    Status Achieved   cues required to correctly perform exercises but reporting compliance   Target Date 07/14/20             PT Long Term Goals - 07/08/20 1053      PT LONG TERM GOAL #1   Title Patient to be  independent with advanced HEP.    Time 6    Period Weeks    Status On-going      PT LONG TERM GOAL #2   Title Patient to demonstrate B LE strength >/=4+/5.    Time 6    Period Weeks    Status On-going      PT LONG TERM GOAL #3   Title Patient to demonstrate R knee AROM/PROM 0-120 degrees.    Time 6    Period Weeks    Status Partially Met   R knee extension lacking     PT LONG TERM GOAL #4   Title Patient to score >/=48/56 on Berg in order to decrease risk of falls.    Time 6    Period Weeks    Status On-going      PT LONG TERM GOAL #5   Title Patient to report 85% improvement in balance confidence with shower transfers.    Time 6    Period Weeks    Status On-going      PT LONG TERM GOAL #6   Title Patient to ambulate atleast 1246ft with 6 minute walk test with 4WW in order to improve functional activity tolerance and endurance.    Time 6    Period Weeks    Status On-going                 Plan - 07/12/20 1104    Clinical Impression Statement Patient reports being fatigued over the weekend. Reports HEP is going well and denies questions. Patient with tendency to avoid using walker consistently, requiring cueing to maintain walker use even with transfers. Patient able to demonstrate carryover with some cueing provided. Worked on quad strengthening ther-ex with addition of light weight- visible quad shaking evident but nearly non-existent quad lag remaining with additional  weighted SLRs. Patient with c/o dizziness upon sitting from supine, which resolved within ~ 1 minute- reported that this is quite typical for him. STS was performed with modifications to encourage R weight shift and hip abductor activation with good effort to correct. Standing LE strengthening ther-ex was performed with sitting rest breaks d/t fatigue. Updated HEP with exercises that were well-tolerated today. Patient reported understanding and without complaints at end of session.    Personal Factors and Comorbidities Age;Comorbidity 3+;Fitness;Past/Current Experience;Time since onset of injury/illness/exacerbation    Comorbidities prostate CA with resection 2010, HTN, GERD, HLD, CKD, chronic systolic/diastolic HF, CHF, CAD, a-fib, anxiety, L medial partial knee arthroplasty 1990's    PT Frequency 2x / week    PT Duration 6 weeks    PT Treatment/Interventions ADLs/Self Care Home Management;Cryotherapy;Electrical Stimulation;Iontophoresis 4mg /ml Dexamethasone;Moist Heat;Balance training;Therapeutic exercise;Therapeutic activities;Functional mobility training;Stair training;Gait training;Ultrasound;Neuromuscular re-education;Patient/family education;Manual techniques;Vestibular;Vasopneumatic Device;Taping;Energy conservation;Dry needling;Passive range of motion    PT Next Visit Plan progress LE strengthening as tolerated; Balance training working on [employer] and Agree with Plan of Care Patient           Patient will benefit from skilled therapeutic intervention in order to improve the following deficits and impairments:  Abnormal gait,Decreased endurance,Increased edema,Pain,Increased fascial restricitons,Decreased strength,Decreased activity tolerance,Decreased balance,Difficulty walking,Improper body mechanics,Decreased range of motion,Impaired flexibility,Postural dysfunction  Visit Diagnosis: Chronic pain of right knee  Muscle weakness (generalized)  Unsteadiness on feet  Other  symptoms and signs involving the musculoskeletal system  Other abnormalities of gait and mobility     Problem List Patient Active Problem List   Diagnosis Date Noted  . Abscess or cellulitis of gluteal  region 06/30/2020  . Carcinoma of prostate ([place]) 06/30/2020  . Congestive heart failure ([place]) 06/30/2020  . Other dyspnea and respiratory abnormality 06/30/2020  . Other psoriasis 06/30/2020  . Other specified counseling 06/30/2020  . 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 OA (osteoarthritis) of knee 01/08/2020  . Atypical atrial flutter ([place])   . Incidental pulmonary nodule 06/22/2014  . Chronic kidney disease   . S/P Maze operation for atrial fibrillation 10/29/2013  . Penetrating atherosclerotic ulcer of aorta ([place]) 10/22/2013  . Chronic atrial fibrillation ([place]) 09/24/2013  . Dyslipidemia 07/15/2013  . CAD (coronary artery disease), native coronary artery 06/28/2013  . Dyspnea on exertion 06/26/2013  . History of prostate cancer 06/26/2013  . Chronic combined systolic and diastolic heart failure ([place]) 06/26/2013  . NICM (nonischemic cardiomyopathy) with EF 20-25% 06/26/2013    Class: Diagnosis of  . Atrial Fibrillation  06/25/2013  . HTN (hypertension) 06/25/2013     Janene Harvey, PT, DPT 07/12/20 11:09 AM   [facility] [address]  [place], [place], [address] Phone: [phone]   Fax:  [phone]  Name: Jarrel Knoke MRN: [id number] Date of Birth: 02-15-1941

## 2020-07-15 ENCOUNTER — Other Ambulatory Visit: Payer: Self-pay

## 2020-07-15 ENCOUNTER — Ambulatory Visit: Payer: Medicare Other

## 2020-07-15 DIAGNOSIS — R2689 Other abnormalities of gait and mobility: Secondary | ICD-10-CM | POA: Diagnosis not present

## 2020-07-15 DIAGNOSIS — M25561 Pain in right knee: Secondary | ICD-10-CM | POA: Diagnosis not present

## 2020-07-15 DIAGNOSIS — R29898 Other symptoms and signs involving the musculoskeletal system: Secondary | ICD-10-CM

## 2020-07-15 DIAGNOSIS — M6281 Muscle weakness (generalized): Secondary | ICD-10-CM | POA: Diagnosis not present

## 2020-07-15 DIAGNOSIS — G8929 Other chronic pain: Secondary | ICD-10-CM | POA: Diagnosis not present

## 2020-07-15 DIAGNOSIS — R2681 Unsteadiness on feet: Secondary | ICD-10-CM

## 2020-07-15 NOTE — Therapy (Signed)
[facility] [address]  [place], [place], [address] Phone: [phone]   Fax:  [phone]  Physical Therapy Treatment  Patient Details  Name: Jose Snyder MRN: [id number] Date of Birth: 06-May-1940 Referring Provider (PT): Clearance Coots, MD   Encounter Date: 07/15/2020   PT End of Session - 07/15/20 1055    Visit Number 7    Number of Visits 13    Date for PT Re-Evaluation 08/04/20    Authorization Type Medicare & BCBS    PT Start Time 1008    PT Stop Time 1053    PT Time Calculation (min) 45 min    Activity Tolerance Patient tolerated treatment well    Behavior During Therapy [facility] for tasks assessed/performed           Past Medical History:  Diagnosis Date  . Anxiety   . Arthritis    R hand- OA  . Atrial Fibrillation  06/25/2013  . CAD (coronary artery disease), native coronary artery 06/28/2013   a. LHC (06/27/13):  Mid LAD 40%, mid D1 75%, mid CFX 50-60%, mid RCA 50%, EF 20%, LVEDP 24 mmHg.  . CHF (congestive heart failure) ([place])   . Chronic combined systolic and diastolic heart failure ([place]) 06/26/2013   a.  Echo (06/25/13):  EF 20-25%, diff HK, restrictive physio, mild MR, mod to severe LAE, mild RVE, mildly reduced RVSF, mod to severe RAE  . Chronic kidney disease   . Dyslipidemia 07/15/2013  . Dysrhythmia    a-fib  . GERD (gastroesophageal reflux disease)   . Hypertension   . Incidental pulmonary nodule 06/22/2014   Seen on CT angiogram of chest  . NICM (nonischemic cardiomyopathy) with EF 20-25% 06/26/2013  . Obesity   . Penetrating atherosclerotic ulcer of aorta ([place]) 10/22/2013   Small penetrating ulcer of descending thoracic aorta discovered on routine CTA  . Prostate cancer ([place])    watchful waiting, no treatment so far  . S/P Maze operation for atrial fibrillation 10/29/2013   Complete bilateral atrial lesion set using cryothermy and bipolar radiofrequency ablation with clipping of LA appendage via  right mini thoracotomy  . Shortness of breath    seen by Stanford Breed, & low energy     Past Surgical History:  Procedure Laterality Date  . CARDIAC CATHETERIZATION  06/2013  . CARDIOVERSION N/A 06/30/2013   Procedure: CARDIOVERSION;  Surgeon: [name], MD;  Location: [place];  Service: Cardiovascular;  Laterality: N/A;  . CARDIOVERSION N/A 07/31/2013   Procedure: CARDIOVERSION;  Surgeon: Thayer Headings, MD;  Location: [place];  Service: Cardiovascular;  Laterality: N/A;  . CARDIOVERSION N/A 03/19/2018   Procedure: CARDIOVERSION;  Surgeon: [name], MD;  Location: [facility] ENDOSCOPY;  Service: Cardiovascular;  Laterality: N/A;  . CARDIOVERSION N/A 07/24/2018   Procedure: CARDIOVERSION;  Surgeon: [name], MD;  Location: [facility] ENDOSCOPY;  Service: Cardiovascular;  Laterality: N/A;  . CLIPPING OF ATRIAL APPENDAGE  10/29/2013   Procedure: CLIPPING OF ATRIAL APPENDAGE;  Surgeon: Rexene Alberts, MD;  Location: [place];  Service: Open Heart Surgery;;  . INTRAOPERATIVE TRANSESOPHAGEAL ECHOCARDIOGRAM N/A 10/29/2013   Procedure: INTRAOPERATIVE TRANSESOPHAGEAL ECHOCARDIOGRAM;  Surgeon: Rexene Alberts, MD;  Location: [place];  Service: Open Heart Surgery;  Laterality: N/A;  . LEFT AND RIGHT HEART CATHETERIZATION WITH CORONARY ANGIOGRAM N/A 06/27/2013   Procedure: LEFT AND RIGHT HEART CATHETERIZATION WITH CORONARY ANGIOGRAM;  Surgeon: Jettie Booze, MD;  Location: [facility] CATH LAB;  Service: Cardiovascular;  Laterality: N/A;  . MEDIAL PARTIAL KNEE REPLACEMENT Left 1990's?  Marland Kitchen MINIMALLY INVASIVE MAZE PROCEDURE N/A 10/29/2013   Procedure: MINIMALLY INVASIVE MAZE PROCEDURE;  Surgeon: Rexene Alberts, MD;  Location: [place];  Service: Open Heart Surgery;  Laterality: N/A;  . TEE WITHOUT CARDIOVERSION N/A 06/30/2013   Procedure: TRANSESOPHAGEAL ECHOCARDIOGRAM (TEE) ;  Surgeon: [name], MD;  Location: [facility] ENDOSCOPY;  Service: Cardiovascular;  Laterality: N/A;  . TRANSURETHRAL RESECTION OF PROSTATE  2010     There were no vitals filed for this visit.   Subjective Assessment - 07/15/20 1003    Subjective Pt reports that he did not sleep well last night, also reports his R knee being more bothersome today.    Pertinent History prostate CA with resection 2010, HTN, GERD, HLD, CKD, chronic systolic/diastolic HF, CHF, CAD, a-fib, anxiety, L medial partial knee arthroplasty 1990's    Diagnostic tests 06/19/20 R knee MRI: Radial tear involving the posterior horn of the medial meniscus, probably remote large osteochondral lesion of medial femoral condyle    Patient Stated Goals decrease pain and increase strength    Currently in Pain? Yes    Pain Score 5     Pain Location Knee    Pain Orientation Right    Pain Descriptors / Indicators Dull    Pain Type Chronic pain                             [facility] Adult PT Treatment/Exercise - 07/15/20 0001      Ambulation/Gait   Ambulation/Gait Yes    Ambulation Distance (Feet) 90 Feet    Assistive device --   rollator   Gait Pattern Step-through pattern;Decreased step length - left;Decreased stance time - right;Decreased hip/knee flexion - right;Decreased hip/knee flexion - left;Decreased dorsiflexion - left   decreased heel strike on the L     Exercises   Exercises Knee/Hip      Knee/Hip Exercises: Stretches   Hip Flexor Stretch Right;2 reps;30 seconds    Hip Flexor Stretch Limitations mod thomas with strap      Knee/Hip Exercises: Aerobic   Recumbent Bike L2x23min      Knee/Hip Exercises: Seated   Long Arc Quad Strengthening;Both;2 sets;10 reps    Long Arc Quad Limitations with ball squeezes    Marching Strengthening;Both;10 reps    Marching Limitations green TB    Abduction/Adduction  Strengthening;Both;2 sets;10 reps    Abd/Adduction Limitations green TB      Knee/Hip Exercises: Supine   Heel Slides AAROM;Right;10 reps    Heel Slides Limitations supine with strap    Bridges Strengthening;Both;2 sets;5 reps    Straight  Leg Raises Strengthening;Right;15 reps    Straight Leg Raises Limitations 2#    Other Supine Knee/Hip Exercises clamshells with green TB 2x10 reps                    PT Short Term Goals - 07/12/20 1108      PT SHORT TERM GOAL #1   Title Patient to be independent with initial HEP.    Time 3    Period Weeks    Status Achieved   cues required to correctly perform exercises but reporting compliance   Target Date 07/14/20             PT Long Term Goals - 07/08/20 1053      PT LONG TERM GOAL #1   Title Patient to be  independent with advanced HEP.    Time 6    Period Weeks    Status On-going      PT LONG TERM GOAL #2   Title Patient to demonstrate B LE strength >/=4+/5.    Time 6    Period Weeks    Status On-going      PT LONG TERM GOAL #3   Title Patient to demonstrate R knee AROM/PROM 0-120 degrees.    Time 6    Period Weeks    Status Partially Met   R knee extension lacking     PT LONG TERM GOAL #4   Title Patient to score >/=48/56 on Berg in order to decrease risk of falls.    Time 6    Period Weeks    Status On-going      PT LONG TERM GOAL #5   Title Patient to report 85% improvement in balance confidence with shower transfers.    Time 6    Period Weeks    Status On-going      PT LONG TERM GOAL #6   Title Patient to ambulate atleast 1261ft with 6 minute walk test with 4WW in order to improve functional activity tolerance and endurance.    Time 6    Period Weeks    Status On-going                 Plan - 07/15/20 1057    Clinical Impression Statement Pt reports that he did not sleep well last night and had a little more fatigue today. Kept exercises seated and supine d/t pt noting increased fatigue and to avoid worsening it. Pt required instruction with all exercises for keep a good upright posture and for core engagment. Also needed cueing to keep feet close together and for slow and controlled movements with the clamshells. He demonstrated a  good performance with the SLRs and good quad control. Also noticed that he lacks heel strike on the L LE and knee flexion with B LE during gait. Pt responded well to treatment overall.    Personal Factors and Comorbidities Age;Comorbidity 3+;Fitness;Past/Current Experience;Time since onset of injury/illness/exacerbation    Comorbidities prostate CA with resection 2010, HTN, GERD, HLD, CKD, chronic systolic/diastolic HF, CHF, CAD, a-fib, anxiety, L medial partial knee arthroplasty 1990's    PT Frequency 2x / week    PT Duration 6 weeks    PT Treatment/Interventions ADLs/Self Care Home Management;Cryotherapy;Electrical Stimulation;Iontophoresis 4mg /ml Dexamethasone;Moist Heat;Balance training;Therapeutic exercise;Therapeutic activities;Functional mobility training;Stair training;Gait training;Ultrasound;Neuromuscular re-education;Patient/family education;Manual techniques;Vestibular;Vasopneumatic Device;Taping;Energy conservation;Dry needling;Passive range of motion    PT Next Visit Plan progress LE strengthening as tolerated; Balance training working on [employer] and Agree with Plan of Care Patient           Patient will benefit from skilled therapeutic intervention in order to improve the following deficits and impairments:  Abnormal gait,Decreased endurance,Increased edema,Pain,Increased fascial restricitons,Decreased strength,Decreased activity tolerance,Decreased balance,Difficulty walking,Improper body mechanics,Decreased range of motion,Impaired flexibility,Postural dysfunction  Visit Diagnosis: Chronic pain of right knee  Muscle weakness (generalized)  Unsteadiness on feet  Other symptoms and signs involving the musculoskeletal system  Other abnormalities of gait and mobility     Problem List Patient Active Problem List   Diagnosis Date Noted  . Abscess or cellulitis of gluteal region 06/30/2020  . Carcinoma of prostate ([place]) 06/30/2020  . Congestive heart failure ([place])  06/30/2020  . Other dyspnea and respiratory abnormality 06/30/2020  . Other psoriasis 06/30/2020  . Other specified counseling  06/30/2020  . 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 OA (osteoarthritis) of knee 01/08/2020  . Atypical atrial flutter ([place])   . Incidental pulmonary nodule 06/22/2014  . Chronic kidney disease   . S/P Maze operation for atrial fibrillation 10/29/2013  . Penetrating atherosclerotic ulcer of aorta ([place]) 10/22/2013  . Chronic atrial fibrillation ([place]) 09/24/2013  . Dyslipidemia 07/15/2013  . CAD (coronary artery disease), native coronary artery 06/28/2013  . Dyspnea on exertion 06/26/2013  . History of prostate cancer 06/26/2013  . Chronic combined systolic and diastolic heart failure ([place]) 06/26/2013  . NICM (nonischemic cardiomyopathy) with EF 20-25% 06/26/2013    Class: Diagnosis of  . Atrial Fibrillation  06/25/2013  . HTN (hypertension) 06/25/2013    Artist Pais, PTA 07/15/2020, 11:53 AM  [facility] [address]  [place], [place], [address] Phone: [phone]   Fax:  [phone]  Name: Gloria Lambertson MRN: [id number] Date of Birth: [birth year]

## 2020-07-19 ENCOUNTER — Ambulatory Visit: Payer: Medicare Other

## 2020-07-19 ENCOUNTER — Other Ambulatory Visit: Payer: Self-pay

## 2020-07-19 DIAGNOSIS — G8929 Other chronic pain: Secondary | ICD-10-CM

## 2020-07-19 DIAGNOSIS — R2689 Other abnormalities of gait and mobility: Secondary | ICD-10-CM | POA: Diagnosis not present

## 2020-07-19 DIAGNOSIS — R2681 Unsteadiness on feet: Secondary | ICD-10-CM | POA: Diagnosis not present

## 2020-07-19 DIAGNOSIS — R29898 Other symptoms and signs involving the musculoskeletal system: Secondary | ICD-10-CM | POA: Diagnosis not present

## 2020-07-19 DIAGNOSIS — M25561 Pain in right knee: Secondary | ICD-10-CM | POA: Diagnosis not present

## 2020-07-19 DIAGNOSIS — M6281 Muscle weakness (generalized): Secondary | ICD-10-CM

## 2020-07-19 NOTE — Therapy (Addendum)
[facility] [address]  [address] [place], [place], [address] Phone: [phone]   Fax:  [phone]  Physical Therapy Treatment/ Progress Note  Patient Details  Name: Jose Snyder MRN: [id number] Date of Birth: 1941-02-10 Referring Provider (PT): Clare Gandy, MD  Progress Note Reporting Period 06/23/20 to 07/19/20  See note below for Objective Data and Assessment of Progress/Goals.     Encounter Date: 07/19/2020   PT End of Session - 07/19/20 1055    Visit Number 8    Number of Visits 13    Date for PT Re-Evaluation 08/04/20    Authorization Type Medicare & BCBS    PT Start Time 1017    PT Stop Time 1056    PT Time Calculation (min) 39 min    Activity Tolerance Patient tolerated treatment well    Behavior During Therapy WFL for tasks assessed/performed           Past Medical History:  Diagnosis Date  . Anxiety   . Arthritis    R hand- OA  . Atrial Fibrillation  06/25/2013  . CAD (coronary artery disease), native coronary artery 06/28/2013   a. LHC (06/27/13):  Mid LAD 40%, mid D1 75%, mid CFX 50-60%, mid RCA 50%, EF 20%, LVEDP 24 mmHg.  . CHF (congestive heart failure) (HCC)   . Chronic combined systolic and diastolic heart failure (HCC) 06/26/2013   a.  Echo (06/25/13):  EF 20-25%, diff HK, restrictive physio, mild MR, mod to severe LAE, mild RVE, mildly reduced RVSF, mod to severe RAE  . Chronic kidney disease   . Dyslipidemia 07/15/2013  . Dysrhythmia    a-fib  . GERD (gastroesophageal reflux disease)   . Hypertension   . Incidental pulmonary nodule 06/22/2014   Seen on CT angiogram of chest  . NICM (nonischemic cardiomyopathy) with EF 20-25% 06/26/2013  . Obesity   . Penetrating atherosclerotic ulcer of aorta (HCC) 10/22/2013   Small penetrating ulcer of descending thoracic aorta discovered on routine CTA  . Prostate cancer (HCC)    watchful waiting, no treatment so far  . S/P Maze operation for atrial  fibrillation 10/29/2013   Complete bilateral atrial lesion set using cryothermy and bipolar radiofrequency ablation with clipping of LA appendage via right mini thoracotomy  . Shortness of breath    seen by Jens Som, & low energy     Past Surgical History:  Procedure Laterality Date  . CARDIAC CATHETERIZATION  06/2013  . CARDIOVERSION N/A 06/30/2013   Procedure: CARDIOVERSION;  Surgeon: Quintella Reichert, MD;  Location[facility] END[facility]COPY;  Service: Cardiovascular;  Laterality: N/A;  . CARDIOVERSION N/A 07/31/2013   Procedure: CARDIOVERSION;  Surgeon: Vesta Mixer, MD;  Location: [facility] ENDOSCOPY;  Service: Cardiovascular;  Laterality: N/A;  . CARDIOVERSION N/A 03/19/2018   Procedure: CARDIOVERSION;  Surgeon: Quintella Reichert, MD;  Location: [facility] ENDOSCOPY;  Service: Cardiovascular;  Laterality: N/A;  . CARDIOVERSION N/A 07/24/2018   Procedure: CARDIOVERSION;  Surgeon: Quintella Reichert, MD;  Location: [facility] ENDOSCOPY;  Service: Cardiovascular;  Laterality: N/A;  . CLIPPING OF ATRIAL APPENDAGE  10/29/2013   Procedure: CLIPPING OF ATRIAL APPENDAGE;  Surgeon: Purcell Nails, MD;  Location: [facility] OR;  Service: Open Heart Surgery;;  . INTRAOPERATIVE TRANSESOPHAGEAL ECHOCARDIOGRAM N/A 10/29/2013   Procedure: INTRAOPERATIVE TRANSESOPHAGEAL ECHOCARDIOGRAM;  Surgeon: Purcell Nails, MD;  Location: [facility] OR;  Service: Open Heart Surgery;  Laterality: N/A;  . LEFT AND RIGHT HEART CATHETERIZATION WITH CORONARY ANGIOGRAM N/A 06/27/2013  Procedure: LEFT AND RIGHT HEART CATHETERIZATION WITH CORONARY ANGIOGRAM;  Surgeon: Jettie Booze, MD;  Location: [facility] CATH LAB;  Service: Cardiovascular;  Laterality: N/A;  . MEDIAL PARTIAL KNEE REPLACEMENT Left 1990's?  Marland Kitchen MINIMALLY INVASIVE MAZE PROCEDURE N/A 10/29/2013   Procedure: MINIMALLY INVASIVE MAZE PROCEDURE;  Surgeon: Rexene Alberts, MD;  Location: [place];  Service: Open Heart Surgery;  Laterality: N/A;  . TEE WITHOUT CARDIOVERSION N/A 06/30/2013   Procedure: TRANSESOPHAGEAL ECHOCARDIOGRAM  (TEE) ;  Surgeon: [name], MD;  Location: [facility] ENDOSCOPY;  Service: Cardiovascular;  Laterality: N/A;  . TRANSURETHRAL RESECTION OF PROSTATE  2010    There were no vitals filed for this visit.   Subjective Assessment - 07/19/20 1020    Subjective Pt notes that he feels better today.    Pertinent History prostate CA with resection 2010, HTN, GERD, HLD, CKD, chronic systolic/diastolic HF, CHF, CAD, a-fib, anxiety, L medial partial knee arthroplasty 1990's    Diagnostic tests 06/19/20 R knee MRI: Radial tear involving the posterior horn of the medial meniscus, probably remote large osteochondral lesion of medial femoral condyle    Patient Stated Goals decrease pain and increase strength    Currently in Pain? Yes    Pain Score 5     Pain Location Knee    Pain Orientation Right;Left    Pain Descriptors / Indicators Dull    Pain Type Chronic pain              [facility] PT Assessment - 07/19/20 0001      Assessment   Medical Diagnosis Insufficiency fx of medial femoral condyle    Referring Provider (PT) Clearance Coots, MD      AROM   Right Knee Extension 0    Right Knee Flexion 120      PROM   Right Knee Extension 0    Right Knee Flexion 127      Strength   Right Hip ADduction 4/5    Left Hip ADduction 4/5    Right Knee Flexion 4+/5    Right Knee Extension 4+/5    Left Knee Flexion 4/5      6 minute walk test results    Aerobic Endurance Distance Walked 620    Endurance additional comments rest needed after 4:47 minutes                         [facility] Adult PT Treatment/Exercise - 07/19/20 0001      Exercises   Exercises Knee/Hip      Knee/Hip Exercises: Aerobic   Recumbent Bike L2x46min                    PT Short Term Goals - 07/12/20 1108      PT SHORT TERM GOAL #1   Title Patient to be independent with initial HEP.    Time 3    Period Weeks    Status Achieved   cues required to correctly perform exercises but reporting compliance    Target Date 07/14/20             PT Long Term Goals - 07/19/20 1027      PT LONG TERM GOAL #1   Title Patient to be independent with advanced HEP.    Time 6    Period Weeks    Status Partially Met   met for initial     PT LONG TERM GOAL #2   Title Patient to demonstrate  B LE strength >/=4+/5.    Time 6    Period Weeks    Status Partially Met   B hip ADD 4/5, all other muscles met     PT LONG TERM GOAL #3   Title Patient to demonstrate R knee AROM/PROM 0-120 degrees.    Time 6    Period Weeks    Status Achieved      PT LONG TERM GOAL #4   Title Patient to score >/=48/56 on [place] in order to decrease risk of falls.    Time 6    Period Weeks    Status On-going      PT LONG TERM GOAL #5   Title Patient to report 85% improvement in balance confidence with shower transfers.    Time 6    Period Weeks    Status On-going   70% improvement     PT LONG TERM GOAL #6   Title Patient to ambulate atleast 1229ft with 6 minute walk test with 4WW in order to improve functional activity tolerance and endurance.    Time 6    Period Weeks    Status On-going   620 ft in 4:47 min, limited by SOB                Plan - 07/19/20 1057    Clinical Impression Statement Pt reports some improvements in his ability to do household activities and walking for longer distances w/o limitations from his R knee. R knee AROM goals has been met. He shows increases in B LE strength but still limitations with hip ADD. Pt was able to ambulate for 4:47 minutes for 620 ft on 6 min walk test, he was mostly limited by SOB which has always been a problem for him. He also notes 70% improvement in confidence with shower transfers, using grab bars to increase safety. He is showing progress with therapy and would continue to benefit from PT to address deficits in balance, endurance, and LE strength.    Personal Factors and Comorbidities Age;Comorbidity 3+;Fitness;Past/Current Experience;Time since onset of  injury/illness/exacerbation    Comorbidities prostate CA with resection 2010, HTN, GERD, HLD, CKD, chronic systolic/diastolic HF, CHF, CAD, a-fib, anxiety, L medial partial knee arthroplasty 1990's    PT Frequency 2x / week    PT Duration 6 weeks    PT Treatment/Interventions ADLs/Self Care Home Management;Cryotherapy;Electrical Stimulation;Iontophoresis 4mg /ml Dexamethasone;Moist Heat;Balance training;Therapeutic exercise;Therapeutic activities;Functional mobility training;Stair training;Gait training;Ultrasound;Neuromuscular re-education;Patient/family education;Manual techniques;Vestibular;Vasopneumatic Device;Taping;Energy conservation;Dry needling;Passive range of motion    PT Next Visit Plan BERG balance: progress LE strengthening as tolerated; Balance training working on [employer] and Agree with Plan of Care Patient           Patient will benefit from skilled therapeutic intervention in order to improve the following deficits and impairments:  Abnormal gait,Decreased endurance,Increased edema,Pain,Increased fascial restricitons,Decreased strength,Decreased activity tolerance,Decreased balance,Difficulty walking,Improper body mechanics,Decreased range of motion,Impaired flexibility,Postural dysfunction  Visit Diagnosis: Chronic pain of right knee  Muscle weakness (generalized)  Unsteadiness on feet  Other symptoms and signs involving the musculoskeletal system  Other abnormalities of gait and mobility     Problem List Patient Active Problem List   Diagnosis Date Noted  . Abscess or cellulitis of gluteal region 06/30/2020  . Carcinoma of prostate ([place]) 06/30/2020  . Congestive heart failure ([place]) 06/30/2020  . Other dyspnea and respiratory abnormality 06/30/2020  . Other psoriasis 06/30/2020  . Other specified counseling 06/30/2020  . 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 OA (  osteoarthritis) of knee 01/08/2020  . Atypical atrial flutter ([place])   . Incidental pulmonary  nodule 06/22/2014  . Chronic kidney disease   . S/P Maze operation for atrial fibrillation 10/29/2013  . Penetrating atherosclerotic ulcer of aorta ([place]) 10/22/2013  . Chronic atrial fibrillation ([place]) 09/24/2013  . Dyslipidemia 07/15/2013  . CAD (coronary artery disease), native coronary artery 06/28/2013  . Dyspnea on exertion 06/26/2013  . History of prostate cancer 06/26/2013  . Chronic combined systolic and diastolic heart failure ([place]) 06/26/2013  . NICM (nonischemic cardiomyopathy) with EF 20-25% 06/26/2013    Class: Diagnosis of  . Atrial Fibrillation  06/25/2013  . HTN (hypertension) 06/25/2013    Artist Pais, PTA 07/19/2020, 11:37 AM  [facility] [address]  [place], [place], [address] Phone: [phone]   Fax:  [phone]  Name: Jose Snyder MRN: [id number] Date of Birth: 1940/12/08    Patient at this time has demonstrated good improvement in knee ROM and LE strength. Still demonstrates limitations in functional activity tolerance d/t limited endurance. D/t remaining deficits, he would benefit from additional skilled PT services to address goals.   Janene Harvey, PT, DPT 07/19/20 12:16 PM

## 2020-07-21 ENCOUNTER — Ambulatory Visit (INDEPENDENT_AMBULATORY_CARE_PROVIDER_SITE_OTHER): Payer: Medicare Other | Admitting: Family Medicine

## 2020-07-21 ENCOUNTER — Other Ambulatory Visit: Payer: Self-pay

## 2020-07-21 VITALS — BP 142/60 | Ht 74.0 in | Wt 260.0 lb

## 2020-07-21 DIAGNOSIS — M25569 Pain in unspecified knee: Secondary | ICD-10-CM | POA: Insufficient documentation

## 2020-07-21 DIAGNOSIS — M1711 Unilateral primary osteoarthritis, right knee: Secondary | ICD-10-CM | POA: Diagnosis not present

## 2020-07-21 DIAGNOSIS — M8560 Other cyst of bone, unspecified site: Secondary | ICD-10-CM

## 2020-07-21 MED ORDER — HYDROCODONE-ACETAMINOPHEN 5-325 MG PO TABS: 1.0000 | ORAL_TABLET | Freq: Three times a day (TID) | ORAL | 0 refills | Status: DC | PRN

## 2020-07-21 NOTE — Progress Notes (Signed)
Jose Snyder - 80 y.o. male MRN [id number]  Date of birth: 05/15/1940  SUBJECTIVE:  Including CC & ROS.  No chief complaint on file.   Jose Snyder is a 80 y.o. male that is following up for his right knee pain.  Has been going through physical therapy and doing well.  Still gets pain intermittently with certain movements and at night.   Review of Systems See HPI   HISTORY: Past Medical, Surgical, Social, and Family History Reviewed & Updated per EMR.   Pertinent Historical Findings include:  Past Medical History:  Diagnosis Date  . Anxiety   . Arthritis    R hand- OA  . Atrial Fibrillation  06/25/2013  . CAD (coronary artery disease), native coronary artery 06/28/2013   a. LHC (06/27/13):  Mid LAD 40%, mid D1 75%, mid CFX 50-60%, mid RCA 50%, EF 20%, LVEDP 24 mmHg.  . CHF (congestive heart failure) ([place])   . Chronic combined systolic and diastolic heart failure ([place]) 06/26/2013   a.  Echo (06/25/13):  EF 20-25%, diff HK, restrictive physio, mild MR, mod to severe LAE, mild RVE, mildly reduced RVSF, mod to severe RAE  . Chronic kidney disease   . Dyslipidemia 07/15/2013  . Dysrhythmia    a-fib  . GERD (gastroesophageal reflux disease)   . Hypertension   . Incidental pulmonary nodule 06/22/2014   Seen on CT angiogram of chest  . NICM (nonischemic cardiomyopathy) with EF 20-25% 06/26/2013  . Obesity   . Penetrating atherosclerotic ulcer of aorta ([place]) 10/22/2013   Small penetrating ulcer of descending thoracic aorta discovered on routine CTA  . Prostate cancer ([place])    watchful waiting, no treatment so far  . S/P Maze operation for atrial fibrillation 10/29/2013   Complete bilateral atrial lesion set using cryothermy and bipolar radiofrequency ablation with clipping of LA appendage via right mini thoracotomy  . Shortness of breath    seen by Stanford Breed, & low energy     Past Surgical History:  Procedure Laterality Date  . CARDIAC CATHETERIZATION  06/2013  . CARDIOVERSION N/A 06/30/2013    Procedure: CARDIOVERSION;  Surgeon: [name], MD;  Location: [place];  Service: Cardiovascular;  Laterality: N/A;  . CARDIOVERSION N/A 07/31/2013   Procedure: CARDIOVERSION;  Surgeon: Thayer Headings, MD;  Location: [place];  Service: Cardiovascular;  Laterality: N/A;  . CARDIOVERSION N/A 03/19/2018   Procedure: CARDIOVERSION;  Surgeon: [name], MD;  Location: [facility] ENDOSCOPY;  Service: Cardiovascular;  Laterality: N/A;  . CARDIOVERSION N/A 07/24/2018   Procedure: CARDIOVERSION;  Surgeon: [name], MD;  Location: [facility] ENDOSCOPY;  Service: Cardiovascular;  Laterality: N/A;  . CLIPPING OF ATRIAL APPENDAGE  10/29/2013   Procedure: CLIPPING OF ATRIAL APPENDAGE;  Surgeon: Rexene Alberts, MD;  Location: [place];  Service: Open Heart Surgery;;  . INTRAOPERATIVE TRANSESOPHAGEAL ECHOCARDIOGRAM N/A 10/29/2013   Procedure: INTRAOPERATIVE TRANSESOPHAGEAL ECHOCARDIOGRAM;  Surgeon: Rexene Alberts, MD;  Location: [place];  Service: Open Heart Surgery;  Laterality: N/A;  . LEFT AND RIGHT HEART CATHETERIZATION WITH CORONARY ANGIOGRAM N/A 06/27/2013   Procedure: LEFT AND RIGHT HEART CATHETERIZATION WITH CORONARY ANGIOGRAM;  Surgeon: Jettie Booze, MD;  Location: [facility] CATH LAB;  Service: Cardiovascular;  Laterality: N/A;  . MEDIAL PARTIAL KNEE REPLACEMENT Left 1990's?  Marland Kitchen MINIMALLY INVASIVE MAZE PROCEDURE N/A 10/29/2013   Procedure: MINIMALLY INVASIVE MAZE PROCEDURE;  Surgeon: Rexene Alberts, MD;  Location: [place];  Service: Open Heart Surgery;  Laterality: N/A;  . TEE WITHOUT CARDIOVERSION  N/A 06/30/2013   Procedure: TRANSESOPHAGEAL ECHOCARDIOGRAM (TEE) ;  Surgeon: [name], MD;  Location: [place];  Service: Cardiovascular;  Laterality: N/A;  . TRANSURETHRAL RESECTION OF PROSTATE  2010    Family History  Problem Relation Age of Onset  . Healthy Father   . Healthy Mother   . Healthy Sister     Social History   Socioeconomic History  . Marital status: Widowed    Spouse name: Not  on file  . Number of children: Not on file  . Years of education: Not on file  . Highest education level: Not on file  Occupational History  . Not on file  Tobacco Use  . Smoking status: Never Smoker  . Smokeless tobacco: Never Used  . Tobacco comment: 06/25/2013 "smoked very light; years and years ago"  Vaping Use  . Vaping Use: Never used  Substance and Sexual Activity  . Alcohol use: Yes    Alcohol/week: 14.0 standard drinks    Types: 7 Glasses of wine, 7 Shots of liquor per week  . Drug use: No  . Sexual activity: Not Currently  Other Topics Concern  . Not on file  Social History Narrative   He lives in [place]. He is widowed. Wife passed of breast cancer last year. No children.    Social Determinants of Health   Financial Resource Strain: Not on file  Food Insecurity: Not on file  Transportation Needs: Not on file  Physical Activity: Not on file  Stress: Not on file  Social Connections: Not on file  Intimate Partner Violence: Not on file     PHYSICAL EXAM:  VS: BP (!) 142/60   Ht 6\' 2"  (1.88 m)   Wt 260 lb (117.9 kg)   BMI 33.38 kg/m  Physical Exam Gen: NAD, alert, cooperative with exam, well-appearing MSK:  Right knee full No effusion. Normal range of motion.  Normal strength resistance. Neurovascular intact     ASSESSMENT & PLAN:   OA (osteoarthritis) of knee Acute on chronic in nature.  No effusion on exam today.  Pain along the joint line was likely contributing to this. -Counseled on home exercise therapy and supportive care. -Continue physical therapy. -Could consider injection.   Pain of knee joint with osteochondral injury Has pain intermittently and with walking that is likely related to the OCD lesion. -Counseled on home exercise therapy and supportive care. -Physical therapy. -If pain is still bothering him with no improvement may need to consider referral to surgery.  Subchondral bone cyst Seems to be contributing to some of his  medial joint pain.

## 2020-07-21 NOTE — Assessment & Plan Note (Addendum)
Acute on chronic in nature.  No effusion on exam today.  Pain along the joint line was likely contributing to this. -Counseled on home exercise therapy and supportive care. -Continue physical therapy. -Could consider injection.

## 2020-07-21 NOTE — Assessment & Plan Note (Signed)
Seems to be contributing to some of his medial joint pain.

## 2020-07-21 NOTE — Assessment & Plan Note (Signed)
Has pain intermittently and with walking that is likely related to the OCD lesion. -Counseled on home exercise therapy and supportive care. -Physical therapy. -If pain is still bothering him with no improvement may need to consider referral to surgery.

## 2020-07-21 NOTE — Patient Instructions (Signed)
Good to see you Please use ice as needed  Please continue physical therapy   Please send me a message in MyChart with any questions or updates.  Please see me back in 4-6 weeks.   --Dr. Raeford Razor

## 2020-07-22 ENCOUNTER — Ambulatory Visit: Payer: Medicare Other | Admitting: Physical Therapy

## 2020-07-22 ENCOUNTER — Encounter: Payer: Self-pay | Admitting: Physical Therapy

## 2020-07-22 DIAGNOSIS — M6281 Muscle weakness (generalized): Secondary | ICD-10-CM | POA: Diagnosis not present

## 2020-07-22 DIAGNOSIS — R29898 Other symptoms and signs involving the musculoskeletal system: Secondary | ICD-10-CM

## 2020-07-22 DIAGNOSIS — R2689 Other abnormalities of gait and mobility: Secondary | ICD-10-CM

## 2020-07-22 DIAGNOSIS — R2681 Unsteadiness on feet: Secondary | ICD-10-CM | POA: Diagnosis not present

## 2020-07-22 DIAGNOSIS — G8929 Other chronic pain: Secondary | ICD-10-CM

## 2020-07-22 DIAGNOSIS — M25561 Pain in right knee: Secondary | ICD-10-CM | POA: Diagnosis not present

## 2020-07-22 NOTE — Patient Instructions (Signed)
   Kinesiology tape  What is kinesiology tape?  There are many brands of kinesiology tape. KTape, Rock Tape, Body Sport, Dynamic tape, to name a few.  It is an elasticized tape designed to support the body's natural healing process. This tape provides stability and support to muscles and joints without restricting motion.  It can also help decrease swelling in the area of application.  How does it work?  The tape microscopically lifts and decompresses the skin to allow for drainage of lymph (swelling) to flow away from area, reducing inflammation. The tape has the ability to help re-educate the neuromuscular system by targeting specific receptors in the skin. The presence of the tape increases the body's awareness of posture and body mechanics.  Do not use with:  . Open wounds . Skin lesions . Adhesive allergies  In some rare cases, mild/moderate skin irritation can occur. This can include redness, itchiness, or hives. If this occurs, immediately remove tape and consult your primary care physician if symptoms are severe or do not resolve within 2 days.  Safe removal of the tape:  To remove tape safely, hold nearby skin with one hand and gentle roll tape down with other hand. You can apply oil or conditioner to tape while in shower prior to removal to loosen adhesive. DO NOT swiftly rip tape off like a band-aid, as this could cause skin tears and additional skin irritation.     For questions, please contact your therapist at:  [facility] [place] [address]  [address] [place], [place], [address] Phone: [phone]   Fax:  [phone]     

## 2020-07-22 NOTE — Therapy (Signed)
[facility] [address]  [place], [place], [address] Phone: [phone]   Fax:  [phone]  Physical Therapy Treatment  Patient Details  Name: Jose Snyder MRN: [id number] Date of Birth: May 18, 1940 Referring Provider (PT): Clearance Coots, MD   Encounter Date: 07/22/2020   PT End of Session - 07/22/20 1057    Visit Number 9    Number of Visits 13    Date for PT Re-Evaluation 08/04/20    Authorization Type Medicare & BCBS    PT Start Time 1016    PT Stop Time 1055    PT Time Calculation (min) 39 min    Activity Tolerance Patient tolerated treatment well;Patient limited by fatigue    Behavior During Therapy [facility] for tasks assessed/performed           Past Medical History:  Diagnosis Date  . Anxiety   . Arthritis    R hand- OA  . Atrial Fibrillation  06/25/2013  . CAD (coronary artery disease), native coronary artery 06/28/2013   a. LHC (06/27/13):  Mid LAD 40%, mid D1 75%, mid CFX 50-60%, mid RCA 50%, EF 20%, LVEDP 24 mmHg.  . CHF (congestive heart failure) ([place])   . Chronic combined systolic and diastolic heart failure ([place]) 06/26/2013   a.  Echo (06/25/13):  EF 20-25%, diff HK, restrictive physio, mild MR, mod to severe LAE, mild RVE, mildly reduced RVSF, mod to severe RAE  . Chronic kidney disease   . Dyslipidemia 07/15/2013  . Dysrhythmia    a-fib  . GERD (gastroesophageal reflux disease)   . Hypertension   . Incidental pulmonary nodule 06/22/2014   Seen on CT angiogram of chest  . NICM (nonischemic cardiomyopathy) with EF 20-25% 06/26/2013  . Obesity   . Penetrating atherosclerotic ulcer of aorta ([place]) 10/22/2013   Small penetrating ulcer of descending thoracic aorta discovered on routine CTA  . Prostate cancer ([place])    watchful waiting, no treatment so far  . S/P Maze operation for atrial fibrillation 10/29/2013   Complete bilateral atrial lesion set using cryothermy and bipolar radiofrequency ablation with  clipping of LA appendage via right mini thoracotomy  . Shortness of breath    seen by Stanford Breed, & low energy     Past Surgical History:  Procedure Laterality Date  . CARDIAC CATHETERIZATION  06/2013  . CARDIOVERSION N/A 06/30/2013   Procedure: CARDIOVERSION;  Surgeon: [name], MD;  Location: [place];  Service: Cardiovascular;  Laterality: N/A;  . CARDIOVERSION N/A 07/31/2013   Procedure: CARDIOVERSION;  Surgeon: Thayer Headings, MD;  Location: [place];  Service: Cardiovascular;  Laterality: N/A;  . CARDIOVERSION N/A 03/19/2018   Procedure: CARDIOVERSION;  Surgeon: [name], MD;  Location: [facility] ENDOSCOPY;  Service: Cardiovascular;  Laterality: N/A;  . CARDIOVERSION N/A 07/24/2018   Procedure: CARDIOVERSION;  Surgeon: [name], MD;  Location: [facility] ENDOSCOPY;  Service: Cardiovascular;  Laterality: N/A;  . CLIPPING OF ATRIAL APPENDAGE  10/29/2013   Procedure: CLIPPING OF ATRIAL APPENDAGE;  Surgeon: Rexene Alberts, MD;  Location: [place];  Service: Open Heart Surgery;;  . INTRAOPERATIVE TRANSESOPHAGEAL ECHOCARDIOGRAM N/A 10/29/2013   Procedure: INTRAOPERATIVE TRANSESOPHAGEAL ECHOCARDIOGRAM;  Surgeon: Rexene Alberts, MD;  Location: [place];  Service: Open Heart Surgery;  Laterality: N/A;  . LEFT AND RIGHT HEART CATHETERIZATION WITH CORONARY ANGIOGRAM N/A 06/27/2013   Procedure: LEFT AND RIGHT HEART CATHETERIZATION WITH CORONARY ANGIOGRAM;  Surgeon: Jettie Booze, MD;  Location: [facility] CATH LAB;  Service: Cardiovascular;  Laterality: N/A;  . MEDIAL PARTIAL KNEE REPLACEMENT Left 1990's?  Marland Kitchen MINIMALLY INVASIVE MAZE PROCEDURE N/A 10/29/2013   Procedure: MINIMALLY INVASIVE MAZE PROCEDURE;  Surgeon: Rexene Alberts, MD;  Location: [place];  Service: Open Heart Surgery;  Laterality: N/A;  . TEE WITHOUT CARDIOVERSION N/A 06/30/2013   Procedure: TRANSESOPHAGEAL ECHOCARDIOGRAM (TEE) ;  Surgeon: [name], MD;  Location: [facility] ENDOSCOPY;  Service: Cardiovascular;  Laterality: N/A;  . TRANSURETHRAL  RESECTION OF PROSTATE  2010    There were no vitals filed for this visit.   Subjective Assessment - 07/22/20 1018    Subjective Having a slow day. MD f/u went well- would like him to continue with what he's been doing. Feels like he is walking better.    Pertinent History prostate CA with resection 2010, HTN, GERD, HLD, CKD, chronic systolic/diastolic HF, CHF, CAD, a-fib, anxiety, L medial partial knee arthroplasty 1990's    Diagnostic tests 06/19/20 R knee MRI: Radial tear involving the posterior horn of the medial meniscus, probably remote large osteochondral lesion of medial femoral condyle    Patient Stated Goals decrease pain and increase strength    Currently in Pain? Yes    Pain Score 5     Pain Location Knee    Pain Orientation Right    Pain Descriptors / Indicators Dull    Pain Type Chronic pain                             [facility] Adult PT Treatment/Exercise - 07/22/20 0001      Neuro Re-ed    Neuro Re-ed Details  tandem walk along counter 4x; L toe tap on cone with full hand/3 finger UE support 2x10 reps   c/o R medial knee pain     Knee/Hip Exercises: Aerobic   Nustep L4x79mn      Knee/Hip Exercises: Standing   Other Standing Knee Exercises scapular rows red TB 2x10   cues to stop at neutral and retract scapulae     Knee/Hip Exercises: Seated   Hamstring Curl Strengthening;Right;10 reps;2 sets    Hamstring Limitations GTband    Sit to Sand without UE support;2 sets;5 reps   blue medball at chest     Manual Therapy   Manual Therapy Taping    Kinesiotex Create Space      Kinesiotix   Create Space R chondromalacia patellae pattern                  PT Education - 07/22/20 1056    Education Details edu on KT use, wear time, removal    Person(s) Educated Patient    Methods Explanation;Demonstration;Tactile cues;Verbal cues;Handout    Comprehension Verbalized understanding            PT Short Term Goals - 07/12/20 1108      PT SHORT  TERM GOAL #1   Title Patient to be independent with initial HEP.    Time 3    Period Weeks    Status Achieved   cues required to correctly perform exercises but reporting compliance   Target Date 07/14/20             PT Long Term Goals - 07/19/20 1027      PT LONG TERM GOAL #1   Title Patient to be independent with advanced HEP.    Time 6    Period Weeks    Status Partially Met  met for initial     PT LONG TERM GOAL #2   Title Patient to demonstrate B LE strength >/=4+/5.    Time 6    Period Weeks    Status Partially Met   B hip ADD 4/5, all other muscles met     PT LONG TERM GOAL #3   Title Patient to demonstrate R knee AROM/PROM 0-120 degrees.    Time 6    Period Weeks    Status Achieved   R knee extension lacking; met for PROM     PT LONG TERM GOAL #4   Title Patient to score >/=48/56 on Berg in order to decrease risk of falls.    Time 6    Period Weeks    Status On-going      PT LONG TERM GOAL #5   Title Patient to report 85% improvement in balance confidence with shower transfers.    Time 6    Period Weeks    Status On-going   70% improvement     PT LONG TERM GOAL #6   Title Patient to ambulate atleast 1223f with 6 minute walk test with 4WW in order to improve functional activity tolerance and endurance.    Time 6    Period Weeks    Status On-going   620 ft in 4:47 min, limited by SOB                Plan - 07/22/20 1057    Clinical Impression Statement Patient arrived to session with moderate R knee pain. Notes that his MD would like him to continue with PT as patent feels that he is now walking better. Worked on progressive LE strengthening in sitting and standing. Patient was able to perform STS transfers with additional resistance at chest. Still requiring intermittent cueing for proper transfer set up and encouragement to increase anterior trunk lean. Worked on encouraging taller standing posture with periscapular/postural strengthening d/t  patient's tendency to demonstrate anterior trunk lean. Dynamic balance activities were introduced with patient reporting and demonstrating challenge with narrow BOS. Intermittent sitting rest breaks required throughout session today d/t fatigue. Patient noted some R knee pain end of session, thus applied KT tape upon leaving, which patient reported benefit from.    Personal Factors and Comorbidities Age;Comorbidity 3+;Fitness;Past/Current Experience;Time since onset of injury/illness/exacerbation    Comorbidities prostate CA with resection 2010, HTN, GERD, HLD, CKD, chronic systolic/diastolic HF, CHF, CAD, a-fib, anxiety, L medial partial knee arthroplasty 1990's    PT Frequency 2x / week    PT Duration 6 weeks    PT Treatment/Interventions ADLs/Self Care Home Management;Cryotherapy;Electrical Stimulation;Iontophoresis 468mml Dexamethasone;Moist Heat;Balance training;Therapeutic exercise;Therapeutic activities;Functional mobility training;Stair training;Gait training;Ultrasound;Neuromuscular re-education;Patient/family education;Manual techniques;Vestibular;Vasopneumatic Device;Taping;Energy conservation;Dry needling;Passive range of motion    PT Next Visit Plan progress LE strengthening as tolerated; Balance training working on [employer] Agree with Plan of Care Patient           Patient will benefit from skilled therapeutic intervention in order to improve the following deficits and impairments:  Abnormal gait,Decreased endurance,Increased edema,Pain,Increased fascial restricitons,Decreased strength,Decreased activity tolerance,Decreased balance,Difficulty walking,Improper body mechanics,Decreased range of motion,Impaired flexibility,Postural dysfunction  Visit Diagnosis: Chronic pain of right knee  Muscle weakness (generalized)  Unsteadiness on feet  Other symptoms and signs involving the musculoskeletal system  Other abnormalities of gait and mobility     Problem  List Patient Active Problem List   Diagnosis Date Noted  . Pain of knee joint with osteochondral injury  07/21/2020  . Carcinoma of prostate ([place]) 06/30/2020  . Congestive heart failure ([place]) 06/30/2020  . Other psoriasis 06/30/2020  . Other specified counseling 06/30/2020  . 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 OA (osteoarthritis) of knee 01/08/2020  . Atypical atrial flutter ([place])   . Incidental pulmonary nodule 06/22/2014  . Chronic kidney disease   . S/P Maze operation for atrial fibrillation 10/29/2013  . Penetrating atherosclerotic ulcer of aorta ([place]) 10/22/2013  . Chronic atrial fibrillation ([place]) 09/24/2013  . Dyslipidemia 07/15/2013  . CAD (coronary artery disease), native coronary artery 06/28/2013  . Dyspnea on exertion 06/26/2013  . History of prostate cancer 06/26/2013  . Chronic combined systolic and diastolic heart failure ([place]) 06/26/2013  . NICM (nonischemic cardiomyopathy) with EF 20-25% 06/26/2013    Class: Diagnosis of  . Atrial Fibrillation  06/25/2013  . HTN (hypertension) 06/25/2013     Janene Harvey, PT, DPT 07/22/20 10:59 AM   [facility] [address]  [place], [place], [address] Phone: [phone]   Fax:  [phone]  Name: Jose Snyder MRN: [id number] Date of Birth: Aug 09, 1940

## 2020-07-26 ENCOUNTER — Other Ambulatory Visit: Payer: Self-pay | Admitting: Cardiology

## 2020-07-26 DIAGNOSIS — I480 Paroxysmal atrial fibrillation: Secondary | ICD-10-CM

## 2020-07-27 ENCOUNTER — Ambulatory Visit: Payer: Medicare Other | Admitting: Physical Therapy

## 2020-07-29 ENCOUNTER — Encounter: Payer: Self-pay | Admitting: Rehabilitative and Restorative Service Providers"

## 2020-07-29 ENCOUNTER — Other Ambulatory Visit: Payer: Self-pay

## 2020-07-29 ENCOUNTER — Ambulatory Visit: Payer: Medicare Other | Attending: Family Medicine | Admitting: Rehabilitative and Restorative Service Providers"

## 2020-07-29 DIAGNOSIS — G8929 Other chronic pain: Secondary | ICD-10-CM | POA: Diagnosis not present

## 2020-07-29 DIAGNOSIS — R2689 Other abnormalities of gait and mobility: Secondary | ICD-10-CM | POA: Insufficient documentation

## 2020-07-29 DIAGNOSIS — R29898 Other symptoms and signs involving the musculoskeletal system: Secondary | ICD-10-CM | POA: Diagnosis not present

## 2020-07-29 DIAGNOSIS — M25561 Pain in right knee: Secondary | ICD-10-CM | POA: Insufficient documentation

## 2020-07-29 DIAGNOSIS — M6281 Muscle weakness (generalized): Secondary | ICD-10-CM | POA: Diagnosis not present

## 2020-07-29 DIAGNOSIS — R2681 Unsteadiness on feet: Secondary | ICD-10-CM

## 2020-07-29 NOTE — Therapy (Signed)
[facility] [address]  [place], [place], [address] Phone: [phone]   Fax:  [phone]  Physical Therapy Treatment  Patient Details  Name: Jose Snyder MRN: [id number] Date of Birth: [birth year] Referring Provider (PT): Clearance Coots, MD  Progress Note Reporting Period 06/23/20 to 07/29/20  See note below for Objective Data and Assessment of Progress/Goals and request for continued PT.       Encounter Date: 07/29/2020   PT End of Session - 07/29/20 0934    Visit Number 10    Number of Visits 18    Date for PT Re-Evaluation 08/27/20    Authorization Type Medicare & BCBS    PT Start Time 0930    PT Stop Time 1010    PT Time Calculation (min) 40 min    Activity Tolerance Patient tolerated treatment well;Patient limited by fatigue    Behavior During Therapy [facility] for tasks assessed/performed           Past Medical History:  Diagnosis Date  . Anxiety   . Arthritis    R hand- OA  . Atrial Fibrillation  06/25/2013  . CAD (coronary artery disease), native coronary artery 06/28/2013   a. LHC (06/27/13):  Mid LAD 40%, mid D1 75%, mid CFX 50-60%, mid RCA 50%, EF 20%, LVEDP 24 mmHg.  . CHF (congestive heart failure) ([place])   . Chronic combined systolic and diastolic heart failure ([place]) 06/26/2013   a.  Echo (06/25/13):  EF 20-25%, diff HK, restrictive physio, mild MR, mod to severe LAE, mild RVE, mildly reduced RVSF, mod to severe RAE  . Chronic kidney disease   . Dyslipidemia 07/15/2013  . Dysrhythmia    a-fib  . GERD (gastroesophageal reflux disease)   . Hypertension   . Incidental pulmonary nodule 06/22/2014   Seen on CT angiogram of chest  . NICM (nonischemic cardiomyopathy) with EF 20-25% 06/26/2013  . Obesity   . Penetrating atherosclerotic ulcer of aorta ([place]) 10/22/2013   Small penetrating ulcer of descending thoracic aorta discovered on routine CTA  . Prostate cancer ([place])    watchful waiting, no treatment so  far  . S/P Maze operation for atrial fibrillation 10/29/2013   Complete bilateral atrial lesion set using cryothermy and bipolar radiofrequency ablation with clipping of LA appendage via right mini thoracotomy  . Shortness of breath    seen by Stanford Breed, & low energy     Past Surgical History:  Procedure Laterality Date  . CARDIAC CATHETERIZATION  06/2013  . CARDIOVERSION N/A 06/30/2013   Procedure: CARDIOVERSION;  Surgeon: [name], MD;  Location: [place];  Service: Cardiovascular;  Laterality: N/A;  . CARDIOVERSION N/A 07/31/2013   Procedure: CARDIOVERSION;  Surgeon: Thayer Headings, MD;  Location: [place];  Service: Cardiovascular;  Laterality: N/A;  . CARDIOVERSION N/A 03/19/2018   Procedure: CARDIOVERSION;  Surgeon: [name], MD;  Location: [facility] ENDOSCOPY;  Service: Cardiovascular;  Laterality: N/A;  . CARDIOVERSION N/A 07/24/2018   Procedure: CARDIOVERSION;  Surgeon: [name], MD;  Location: [facility] ENDOSCOPY;  Service: Cardiovascular;  Laterality: N/A;  . CLIPPING OF ATRIAL APPENDAGE  10/29/2013   Procedure: CLIPPING OF ATRIAL APPENDAGE;  Surgeon: Rexene Alberts, MD;  Location: [place];  Service: Open Heart Surgery;;  . INTRAOPERATIVE TRANSESOPHAGEAL ECHOCARDIOGRAM N/A 10/29/2013   Procedure: INTRAOPERATIVE TRANSESOPHAGEAL ECHOCARDIOGRAM;  Surgeon: Rexene Alberts, MD;  Location: [place];  Service: Open Heart Surgery;  Laterality: N/A;  . LEFT AND RIGHT HEART  CATHETERIZATION WITH CORONARY ANGIOGRAM N/A 06/27/2013   Procedure: LEFT AND RIGHT HEART CATHETERIZATION WITH CORONARY ANGIOGRAM;  Surgeon: Jettie Booze, MD;  Location[facility] CATH LAB;  Service: Cardiovascular;  Laterality: N/A;  . MEDIAL PARTIAL KNEE REPLACEMENT Left 1990's?  Marland Kitchen MINIMALLY INVASIVE MAZE PROCEDURE N/A 10/29/2013   Procedure: MINIMALLY INVASIVE MAZE PROCEDURE;  Surgeon: Rexene Alberts, MD;  Location: [place];  Service: Open Heart Surgery;  Laterality: N/A;  . TEE WITHOUT CARDIOVERSION N/A 06/30/2013    Procedure: TRANSESOPHAGEAL ECHOCARDIOGRAM (TEE) ;  Surgeon: [name], MD;  Location: [facility] ENDOSCOPY;  Service: Cardiovascular;  Laterality: N/A;  . TRANSURETHRAL RESECTION OF PROSTATE  2010    There were no vitals filed for this visit.   Subjective Assessment - 07/29/20 0933    Subjective I am having a rough start to my day.  I didn't realize until I got here that I wore my house slippers instead of my tennis shoes.    Pertinent History prostate CA with resection 2010, HTN, GERD, HLD, CKD, chronic systolic/diastolic HF, CHF, CAD, a-fib, anxiety, L medial partial knee arthroplasty 1990's    Diagnostic tests 06/19/20 R knee MRI: Radial tear involving the posterior horn of the medial meniscus, probably remote large osteochondral lesion of medial femoral condyle    Patient Stated Goals decrease pain and increase strength    Currently in Pain? Yes    Pain Score 5     Pain Location Knee    Pain Orientation Right    Pain Descriptors / Indicators Dull;Aching    Pain Type Chronic pain              [facility] PT Assessment - 07/29/20 0001      AROM   Right Knee Extension 0    Right Knee Flexion 121      PROM   Right Knee Extension 0    Right Knee Flexion 127      Strength   Right Hip ADduction 4/5    Left Hip ADduction 4/5    Right Knee Flexion 5/5    Right Knee Extension 5/5    Left Knee Flexion 5/5      6 Minute walk- Post Test   6 Minute Walk Post Test yes    HR (bpm) 72    02 Sat (%RA) 97 %    Modified Borg Scale for Dyspnea 5- Strong or hard breathing      6 minute walk test results    Aerobic Endurance Distance Walked 520    Endurance additional comments Required a recovery period for approx a minute after 4 laps      [employer]   Sit to Stand Able to stand without using hands and stabilize independently    Standing Unsupported Able to stand safely 2 minutes    Sitting with Back Unsupported but Feet Supported on Floor or Stool Able to sit safely and securely 2  minutes    Stand to Sit Sits safely with minimal use of hands    Transfers Able to transfer safely, minor use of hands    Standing Unsupported with Eyes Closed Able to stand 10 seconds with supervision    Standing Unsupported with Feet Together Able to place feet together independently and stand for 1 minute with supervision    From Standing, Reach Forward with Outstretched Arm Can reach confidently >25 cm (10")    From Standing Position, Pick up Object from Floor Able to pick up shoe safely and easily  From Standing Position, Turn to Look Behind Over each Shoulder Looks behind from both sides and weight shifts well    Turn 360 Degrees Able to turn 360 degrees safely in 4 seconds or less    Standing Unsupported, Alternately Place Feet on Step/Stool Able to complete >2 steps/needs minimal assist    Standing Unsupported, One Foot in [employer] balance while stepping or standing    Standing on One Leg Unable to try or needs assist to prevent fall    Total Score 43                         [facility] Adult PT Treatment/Exercise - 07/29/20 0001      Knee/Hip Exercises: Aerobic   Nustep L4x57min      Knee/Hip Exercises: Seated   Clamshell with TheraBand Green   2x10   Hamstring Curl Strengthening;Both;2 sets;10 reps    Hamstring Limitations GTband    Sit to Sand without UE support;2 sets;5 reps                    PT Short Term Goals - 07/29/20 0941      PT SHORT TERM GOAL #1   Title Patient to be independent with initial HEP.    Status Achieved             PT Long Term Goals - 07/29/20 2297      PT LONG TERM GOAL #1   Title Patient to be independent with advanced HEP.    Status Partially Met      PT LONG TERM GOAL #2   Title Patient to demonstrate B LE strength >/=4+/5.    Status Achieved      PT LONG TERM GOAL #3   Title Patient to demonstrate R knee AROM/PROM 0-120 degrees.    Status Achieved      PT LONG TERM GOAL #4   Title Patient to score  >/=48/56 on Berg in order to decrease risk of falls.    Status Partially Met      PT LONG TERM GOAL #5   Title Patient to report 85% improvement in balance confidence with shower transfers.    Status Partially Met      PT LONG TERM GOAL #6   Title Patient to ambulate atleast 1232ft with 6 minute walk test with 4WW in order to improve functional activity tolerance and endurance.    Status On-going                 Plan - 07/29/20 1011    Clinical Impression Statement Pt continues to make great progress towards goal related activities.  He has met some of his goals, including strength and ROM, but has not yet met his balance or 6 min walk test goal.  Pt continues to have deficits with balance, particularly higher level like tandem or single leg.  He continues to require skilled PT to progress towards goal related activities and help him meet his goals of being safer at home.  Recommend additional 2x/week for 4 additional weeks.    Personal Factors and Comorbidities Age;Comorbidity 3+;Fitness;Past/Current Experience;Time since onset of injury/illness/exacerbation    Comorbidities prostate CA with resection 2010, HTN, GERD, HLD, CKD, chronic systolic/diastolic HF, CHF, CAD, a-fib, anxiety, L medial partial knee arthroplasty 1990's    Rehab Potential Good    PT Frequency 2x / week    PT Duration 4 weeks    PT Treatment/Interventions ADLs/Self Care  Home Management;Cryotherapy;Electrical Stimulation;Iontophoresis 4mg /ml Dexamethasone;Moist Heat;Balance training;Therapeutic exercise;Therapeutic activities;Functional mobility training;Stair training;Gait training;Ultrasound;Neuromuscular re-education;Patient/family education;Manual techniques;Vestibular;Vasopneumatic Device;Taping;Energy conservation;Dry needling;Passive range of motion    PT Next Visit Plan progress LE strengthening as tolerated; Balance training working on [employer] and Agree with Plan of Care Patient            Patient will benefit from skilled therapeutic intervention in order to improve the following deficits and impairments:  Abnormal gait,Decreased endurance,Increased edema,Pain,Increased fascial restricitons,Decreased strength,Decreased activity tolerance,Decreased balance,Difficulty walking,Improper body mechanics,Decreased range of motion,Impaired flexibility,Postural dysfunction  Visit Diagnosis: Chronic pain of right knee  Muscle weakness (generalized)  Unsteadiness on feet  Other symptoms and signs involving the musculoskeletal system  Other abnormalities of gait and mobility     Problem List Patient Active Problem List   Diagnosis Date Noted  . Pain of knee joint with osteochondral injury 07/21/2020  . Carcinoma of prostate ([place]) 06/30/2020  . Congestive heart failure ([place]) 06/30/2020  . Other psoriasis 06/30/2020  . Other specified counseling 06/30/2020  . 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 OA (osteoarthritis) of knee 01/08/2020  . Atypical atrial flutter ([place])   . Incidental pulmonary nodule 06/22/2014  . Chronic kidney disease   . S/P Maze operation for atrial fibrillation 10/29/2013  . Penetrating atherosclerotic ulcer of aorta ([place]) 10/22/2013  . Chronic atrial fibrillation ([place]) 09/24/2013  . Dyslipidemia 07/15/2013  . CAD (coronary artery disease), native coronary artery 06/28/2013  . Dyspnea on exertion 06/26/2013  . History of prostate cancer 06/26/2013  . Chronic combined systolic and diastolic heart failure ([place]) 06/26/2013  . NICM (nonischemic cardiomyopathy) with EF 20-25% 06/26/2013    Class: Diagnosis of  . Atrial Fibrillation  06/25/2013  . HTN (hypertension) 06/25/2013    Juel Burrow, PT, DPT 07/29/2020, 10:21 AM  [facility] [address]  [place], [place], [address] Phone: [phone]   Fax:  [phone]  Name: Jose Snyder MRN: [id number] Date of Birth: 03-23-1940

## 2020-08-02 ENCOUNTER — Ambulatory Visit: Payer: Medicare Other

## 2020-08-02 ENCOUNTER — Other Ambulatory Visit: Payer: Self-pay

## 2020-08-02 DIAGNOSIS — R2681 Unsteadiness on feet: Secondary | ICD-10-CM | POA: Diagnosis not present

## 2020-08-02 DIAGNOSIS — R29898 Other symptoms and signs involving the musculoskeletal system: Secondary | ICD-10-CM | POA: Diagnosis not present

## 2020-08-02 DIAGNOSIS — M25561 Pain in right knee: Secondary | ICD-10-CM | POA: Diagnosis not present

## 2020-08-02 DIAGNOSIS — M6281 Muscle weakness (generalized): Secondary | ICD-10-CM | POA: Diagnosis not present

## 2020-08-02 DIAGNOSIS — G8929 Other chronic pain: Secondary | ICD-10-CM | POA: Diagnosis not present

## 2020-08-02 DIAGNOSIS — R2689 Other abnormalities of gait and mobility: Secondary | ICD-10-CM | POA: Diagnosis not present

## 2020-08-02 NOTE — Therapy (Signed)
[facility] [address]  [place], [place], [address] Phone: [phone]   Fax:  [phone]  Physical Therapy Treatment  Patient Details  Name: Jose Snyder MRN: [id number] Date of Birth: 1941/02/16 Referring Provider (PT): Clearance Coots, MD   Encounter Date: 08/02/2020   PT End of Session - 08/02/20 1053    Visit Number 11    Number of Visits 18    Date for PT Re-Evaluation 08/27/20    Authorization Type Medicare & BCBS    PT Start Time 1007    PT Stop Time 1049    PT Time Calculation (min) 42 min    Activity Tolerance Patient tolerated treatment well;Patient limited by pain    Behavior During Therapy [facility] for tasks assessed/performed           Past Medical History:  Diagnosis Date  . Anxiety   . Arthritis    R hand- OA  . Atrial Fibrillation  06/25/2013  . CAD (coronary artery disease), native coronary artery 06/28/2013   a. LHC (06/27/13):  Mid LAD 40%, mid D1 75%, mid CFX 50-60%, mid RCA 50%, EF 20%, LVEDP 24 mmHg.  . CHF (congestive heart failure) ([place])   . Chronic combined systolic and diastolic heart failure ([place]) 06/26/2013   a.  Echo (06/25/13):  EF 20-25%, diff HK, restrictive physio, mild MR, mod to severe LAE, mild RVE, mildly reduced RVSF, mod to severe RAE  . Chronic kidney disease   . Dyslipidemia 07/15/2013  . Dysrhythmia    a-fib  . GERD (gastroesophageal reflux disease)   . Hypertension   . Incidental pulmonary nodule 06/22/2014   Seen on CT angiogram of chest  . NICM (nonischemic cardiomyopathy) with EF 20-25% 06/26/2013  . Obesity   . Penetrating atherosclerotic ulcer of aorta ([place]) 10/22/2013   Small penetrating ulcer of descending thoracic aorta discovered on routine CTA  . Prostate cancer ([place])    watchful waiting, no treatment so far  . S/P Maze operation for atrial fibrillation 10/29/2013   Complete bilateral atrial lesion set using cryothermy and bipolar radiofrequency ablation with  clipping of LA appendage via right mini thoracotomy  . Shortness of breath    seen by Stanford Breed, & low energy     Past Surgical History:  Procedure Laterality Date  . CARDIAC CATHETERIZATION  06/2013  . CARDIOVERSION N/A 06/30/2013   Procedure: CARDIOVERSION;  Surgeon: [name], MD;  Location: [place];  Service: Cardiovascular;  Laterality: N/A;  . CARDIOVERSION N/A 07/31/2013   Procedure: CARDIOVERSION;  Surgeon: Thayer Headings, MD;  Location: [place];  Service: Cardiovascular;  Laterality: N/A;  . CARDIOVERSION N/A 03/19/2018   Procedure: CARDIOVERSION;  Surgeon: [name], MD;  Location: [facility] ENDOSCOPY;  Service: Cardiovascular;  Laterality: N/A;  . CARDIOVERSION N/A 07/24/2018   Procedure: CARDIOVERSION;  Surgeon: [name], MD;  Location[facility]COPY;  Service: Cardiovascular;  Laterality: N/A;  . CLIPPING OF ATRIAL APPENDAGE  10/29/2013   Procedure: CLIPPING OF ATRIAL APPENDAGE;  Surgeon: Rexene Alberts, MD;  Location: [place];  Service: Open Heart Surgery;;  . INTRAOPERATIVE TRANSESOPHAGEAL ECHOCARDIOGRAM N/A 10/29/2013   Procedure: INTRAOPERATIVE TRANSESOPHAGEAL ECHOCARDIOGRAM;  Surgeon: Rexene Alberts, MD;  Location: [place];  Service: Open Heart Surgery;  Laterality: N/A;  . LEFT AND RIGHT HEART CATHETERIZATION WITH CORONARY ANGIOGRAM N/A 06/27/2013   Procedure: LEFT AND RIGHT HEART CATHETERIZATION WITH CORONARY ANGIOGRAM;  Surgeon: Jettie Booze, MD;  Location: [facility] CATH LAB;  Service: Cardiovascular;  Laterality: N/A;  . MEDIAL PARTIAL KNEE REPLACEMENT Left 1990's?  Marland Kitchen MINIMALLY INVASIVE MAZE PROCEDURE N/A 10/29/2013   Procedure: MINIMALLY INVASIVE MAZE PROCEDURE;  Surgeon: Purcell Nails, MD;  [facility];  Service: Open Heart Surgery;  Laterality: N/A;  . TEE WITHOUT CARDIOVERSION N/A 06/30/2013   Procedure: TRANSESOPHAGEAL ECHOCARDIOGRAM (TEE) ;  Surgeon: Quintella Reichert, MD;  Location: [facility] ENDOSCOPY;  Service: Cardiovascular;  Laterality: N/A;  . TRANSURETHRAL  RESECTION OF PROSTATE  2010    There were no vitals filed for this visit.   Subjective Assessment - 08/02/20 1007    Subjective Did a lot of moving around on uneven ground, outside all day on Saturday. "SL balance activities and footwork need help".    Pertinent History prostate CA with resection 2010, HTN, GERD, HLD, CKD, chronic systolic/diastolic HF, CHF, CAD, a-fib, anxiety, L medial partial knee arthroplasty 1990's    Diagnostic tests 06/19/20 R knee MRI: Radial tear involving the posterior horn of the medial meniscus, probably remote large osteochondral lesion of medial femoral condyle    Patient Stated Goals decrease pain and increase strength    Currently in Pain? Yes    Pain Score 6     Pain Location Knee    Pain Orientation Right    Pain Descriptors / Indicators Aching;Dull    Pain Type Acute pain;Chronic pain                             [facility] Adult PT Treatment/Exercise - 08/02/20 0001      Knee/Hip Exercises: Aerobic   Recumbent Bike L3x69min      Knee/Hip Exercises: Standing   Functional Squat 10 reps    Functional Squat Limitations mini squats at the counter    Other Standing Knee Exercises marching 10x at the counter, [facility]      Knee/Hip Exercises: Seated   Long Arc Quad Strengthening;Both;10 reps    Long Arc Quad Limitations with ball squeezing    Other Seated Knee/Hip Exercises ball squeezing 10x5"               Balance Exercises - 08/02/20 0001      Balance Exercises: Standing   Partial Tandem Stance Eyes open;Upper extremity support 1;2 reps;10 secs;20 secs   1st set 10 sec, 2nd set 20 sec; trials of fwd reaching, perturbations given 15 sec each            PT Education - 08/02/20 1036    Education Details HEP update: Access Code: [facility]    Person(s) Educated Patient    Methods Explanation;Demonstration;Verbal cues;Handout    Comprehension Verbalized understanding;Returned demonstration;Verbal cues required            PT  Short Term Goals - 07/29/20 0941      PT SHORT TERM GOAL #1   Title Patient to be independent with initial HEP.    Status Achieved             PT Long Term Goals - 07/29/20 2866      PT LONG TERM GOAL #1   Title Patient to be independent with advanced HEP.    Status Partially Met      PT LONG TERM GOAL #2   Title Patient to demonstrate B LE strength >/=4+/5.    Status Achieved      PT LONG TERM GOAL #3   Title Patient to demonstrate R knee AROM/PROM 0-120 degrees.  Status Achieved      PT LONG TERM GOAL #4   Title Patient to score >/=48/56 on Berg in order to decrease risk of falls.    Status Partially Met      PT LONG TERM GOAL #5   Title Patient to report 85% improvement in balance confidence with shower transfers.    Status Partially Met      PT LONG TERM GOAL #6   Title Patient to ambulate atleast 1266ft with 6 minute walk test with 4WW in order to improve functional activity tolerance and endurance.    Status On-going                 Plan - 08/02/20 1055    Clinical Impression Statement Pt reports increased activity with being on his feet more this weekend. He did have increased pain today, most likely from increased activity over the weekend. Reviewed his HEP and added new exercises to further strengthen the LEs. He has reports of difficulty with the tandem standing and showed instability when perturbations were given. Cues were required for correct foot positioning during squats and to hinge hips to prevent knees over toes. Educated him on using ice at home for pain and for possible inflammation.    Personal Factors and Comorbidities Age;Comorbidity 3+;Fitness;Past/Current Experience;Time since onset of injury/illness/exacerbation    Comorbidities prostate CA with resection 2010, HTN, GERD, HLD, CKD, chronic systolic/diastolic HF, CHF, CAD, a-fib, anxiety, L medial partial knee arthroplasty 1990's    PT Frequency 2x / week    PT Duration 4 weeks    PT  Treatment/Interventions ADLs/Self Care Home Management;Cryotherapy;Electrical Stimulation;Iontophoresis 4mg /ml Dexamethasone;Moist Heat;Balance training;Therapeutic exercise;Therapeutic activities;Functional mobility training;Stair training;Gait training;Ultrasound;Neuromuscular re-education;Patient/family education;Manual techniques;Vestibular;Vasopneumatic Device;Taping;Energy conservation;Dry needling;Passive range of motion    PT Next Visit Plan progress LE strengthening as tolerated; Balance training working on [employer] and Agree with Plan of Care Patient           Patient will benefit from skilled therapeutic intervention in order to improve the following deficits and impairments:  Abnormal gait,Decreased endurance,Increased edema,Pain,Increased fascial restricitons,Decreased strength,Decreased activity tolerance,Decreased balance,Difficulty walking,Improper body mechanics,Decreased range of motion,Impaired flexibility,Postural dysfunction  Visit Diagnosis: Chronic pain of right knee  Muscle weakness (generalized)  Unsteadiness on feet  Other symptoms and signs involving the musculoskeletal system  Other abnormalities of gait and mobility     Problem List Patient Active Problem List   Diagnosis Date Noted  . Pain of knee joint with osteochondral injury 07/21/2020  . Carcinoma of prostate ([place]) 06/30/2020  . Congestive heart failure ([place]) 06/30/2020  . Other psoriasis 06/30/2020  . Other specified counseling 06/30/2020  . 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 OA (osteoarthritis) of knee 01/08/2020  . Atypical atrial flutter ([place])   . Incidental pulmonary nodule 06/22/2014  . Chronic kidney disease   . S/P Maze operation for atrial fibrillation 10/29/2013  . Penetrating atherosclerotic ulcer of aorta ([place]) 10/22/2013  . Chronic atrial fibrillation ([place]) 09/24/2013  . Dyslipidemia 07/15/2013  . CAD (coronary artery disease), native coronary artery 06/28/2013  .  Dyspnea on exertion 06/26/2013  . History of prostate cancer 06/26/2013  . Chronic combined systolic and diastolic heart failure ([place]) 06/26/2013  . NICM (nonischemic cardiomyopathy) with EF 20-25% 06/26/2013    Class: Diagnosis of  . Atrial Fibrillation  06/25/2013  . HTN (hypertension) 06/25/2013    Artist Pais, PTA 08/02/2020, 11:00 AM  [facility] [address]  [address]  [place], [place], [address] Phone: [phone]   Fax:  [phone]  Name: Daiwik [place] MRN: [id number] Date of Birth: [birth year]

## 2020-08-04 ENCOUNTER — Ambulatory Visit: Payer: Medicare Other | Admitting: Physical Therapy

## 2020-08-04 ENCOUNTER — Encounter: Payer: Self-pay | Admitting: Physical Therapy

## 2020-08-04 ENCOUNTER — Other Ambulatory Visit: Payer: Self-pay

## 2020-08-04 VITALS — BP 130/59 | HR 62

## 2020-08-04 DIAGNOSIS — M25561 Pain in right knee: Secondary | ICD-10-CM | POA: Diagnosis not present

## 2020-08-04 DIAGNOSIS — R2689 Other abnormalities of gait and mobility: Secondary | ICD-10-CM | POA: Diagnosis not present

## 2020-08-04 DIAGNOSIS — R29898 Other symptoms and signs involving the musculoskeletal system: Secondary | ICD-10-CM | POA: Diagnosis not present

## 2020-08-04 DIAGNOSIS — M6281 Muscle weakness (generalized): Secondary | ICD-10-CM

## 2020-08-04 DIAGNOSIS — G8929 Other chronic pain: Secondary | ICD-10-CM

## 2020-08-04 DIAGNOSIS — R2681 Unsteadiness on feet: Secondary | ICD-10-CM | POA: Diagnosis not present

## 2020-08-04 NOTE — Therapy (Signed)
[facility] [address]  [place], [place], [address] Phone: [phone]   Fax:  [phone]  Physical Therapy Treatment  Patient Details  Name: Jose Snyder MRN: [id number] Date of Birth: 25-Feb-1941 Referring Provider (PT): Clearance Coots, MD   Encounter Date: 08/04/2020   PT End of Session - 08/04/20 1047    Visit Number 12    Number of Visits 18    Date for PT Re-Evaluation 08/27/20    Authorization Type Medicare & BCBS    PT Start Time 1006    PT Stop Time 1047    PT Time Calculation (min) 41 min    Activity Tolerance Patient tolerated treatment well;Patient limited by pain    Behavior During Therapy [facility] for tasks assessed/performed           Past Medical History:  Diagnosis Date  . Anxiety   . Arthritis    R hand- OA  . Atrial Fibrillation  06/25/2013  . CAD (coronary artery disease), native coronary artery 06/28/2013   a. LHC (06/27/13):  Mid LAD 40%, mid D1 75%, mid CFX 50-60%, mid RCA 50%, EF 20%, LVEDP 24 mmHg.  . CHF (congestive heart failure) ([place])   . Chronic combined systolic and diastolic heart failure ([place]) 06/26/2013   a.  Echo (06/25/13):  EF 20-25%, diff HK, restrictive physio, mild MR, mod to severe LAE, mild RVE, mildly reduced RVSF, mod to severe RAE  . Chronic kidney disease   . Dyslipidemia 07/15/2013  . Dysrhythmia    a-fib  . GERD (gastroesophageal reflux disease)   . Hypertension   . Incidental pulmonary nodule 06/22/2014   Seen on CT angiogram of chest  . NICM (nonischemic cardiomyopathy) with EF 20-25% 06/26/2013  . Obesity   . Penetrating atherosclerotic ulcer of aorta ([place]) 10/22/2013   Small penetrating ulcer of descending thoracic aorta discovered on routine CTA  . Prostate cancer ([place])    watchful waiting, no treatment so far  . S/P Maze operation for atrial fibrillation 10/29/2013   Complete bilateral atrial lesion set using cryothermy and bipolar radiofrequency ablation with  clipping of LA appendage via right mini thoracotomy  . Shortness of breath    seen by Stanford Breed, & low energy     Past Surgical History:  Procedure Laterality Date  . CARDIAC CATHETERIZATION  06/2013  . CARDIOVERSION N/A 06/30/2013   Procedure: CARDIOVERSION;  Surgeon: [name], MD;  Location: [place];  Service: Cardiovascular;  Laterality: N/A;  . CARDIOVERSION N/A 07/31/2013   Procedure: CARDIOVERSION;  Surgeon: Thayer Headings, MD;  Location: [place];  Service: Cardiovascular;  Laterality: N/A;  . CARDIOVERSION N/A 03/19/2018   Procedure: CARDIOVERSION;  Surgeon: [name], MD;  Location: [facility] ENDOSCOPY;  Service: Cardiovascular;  Laterality: N/A;  . CARDIOVERSION N/A 07/24/2018   Procedure: CARDIOVERSION;  Surgeon: [name], MD;  Location: [facility] ENDOSCOPY;  Service: Cardiovascular;  Laterality: N/A;  . CLIPPING OF ATRIAL APPENDAGE  10/29/2013   Procedure: CLIPPING OF ATRIAL APPENDAGE;  Surgeon: Rexene Alberts, MD;  Location: [place];  Service: Open Heart Surgery;;  . INTRAOPERATIVE TRANSESOPHAGEAL ECHOCARDIOGRAM N/A 10/29/2013   Procedure: INTRAOPERATIVE TRANSESOPHAGEAL ECHOCARDIOGRAM;  Surgeon: Rexene Alberts, MD;  Location: [place];  Service: Open Heart Surgery;  Laterality: N/A;  . LEFT AND RIGHT HEART CATHETERIZATION WITH CORONARY ANGIOGRAM N/A 06/27/2013   Procedure: LEFT AND RIGHT HEART CATHETERIZATION WITH CORONARY ANGIOGRAM;  Surgeon: Jettie Booze, MD;  Location: [facility] CATH LAB;  Service: Cardiovascular;  Laterality: N/A;  . MEDIAL PARTIAL KNEE REPLACEMENT Left 1990's?  Marland Kitchen MINIMALLY INVASIVE MAZE PROCEDURE N/A 10/29/2013   Procedure: MINIMALLY INVASIVE MAZE PROCEDURE;  Surgeon: Rexene Alberts, MD;  Location: [place];  Service: Open Heart Surgery;  Laterality: N/A;  . TEE WITHOUT CARDIOVERSION N/A 06/30/2013   Procedure: TRANSESOPHAGEAL ECHOCARDIOGRAM (TEE) ;  Surgeon: [name], MD;  Location: [place];  Service: Cardiovascular;  Laterality: N/A;  . TRANSURETHRAL  RESECTION OF PROSTATE  2010    Vitals:   08/04/20 1007  BP: (!) 130/59  Pulse: 62  SpO2: 97%     Subjective Assessment - 08/04/20 1010    Subjective Still feeling fatigued and having more pain in the R knee since having a busy weekend. Also feeling more off balance.    Pertinent History prostate CA with resection 2010, HTN, GERD, HLD, CKD, chronic systolic/diastolic HF, CHF, CAD, a-fib, anxiety, L medial partial knee arthroplasty 1990's    Diagnostic tests 06/19/20 R knee MRI: Radial tear involving the posterior horn of the medial meniscus, probably remote large osteochondral lesion of medial femoral condyle    Patient Stated Goals decrease pain and increase strength    Currently in Pain? Yes    Pain Score 6     Pain Location Knee    Pain Orientation Right    Pain Descriptors / Indicators Aching;Dull    Pain Type Acute pain;Chronic pain                             [facility] Adult PT Treatment/Exercise - 08/04/20 0001      Knee/Hip Exercises: Stretches   Active Hamstring Stretch Right;2 reps;30 seconds    Active Hamstring Stretch Limitations heel resting on stool    Gastroc Stretch Right;2 reps;30 seconds    Gastroc Stretch Limitations runner's stretch      Knee/Hip Exercises: Aerobic   Nustep L3x51mn (UEs/LEs)      Knee/Hip Exercises: Supine   Bridges Strengthening;Both;2 sets;10 reps    Bridges Limitations 2nd set with red TB    Straight Leg Raises Strengthening;Right;10 reps;2 sets    Straight Leg Raises Limitations 10x 0#, 10x 2#   lacking TKE but no quad lag   Other Supine Knee/Hip Exercises bent knee fallouts 10x with red TB; hooklying resisted march with red TB 2x10      Knee/Hip Exercises: Sidelying   Hip ABduction Strengthening;Right;Left;1 set;10 reps    Hip ABduction Limitations good alignment; limited TKE/quad recruitment    Hip ADduction Strengthening;Right;Left;1 set;10 reps    Hip ADduction Limitations opposite LE elevated on bolster                     PT Short Term Goals - 07/29/20 0941      PT SHORT TERM GOAL #1   Title Patient to be independent with initial HEP.    Status Achieved             PT Long Term Goals - 07/29/20 03403     PT LONG TERM GOAL #1   Title Patient to be independent with advanced HEP.    Status Partially Met      PT LONG TERM GOAL #2   Title Patient to demonstrate B LE strength >/=4+/5.    Status Achieved      PT LONG TERM GOAL #3   Title Patient to demonstrate R knee AROM/PROM 0-120 degrees.    Status  Achieved      PT LONG TERM GOAL #4   Title Patient to score >/=48/56 on Berg in order to decrease risk of falls.    Status Partially Met      PT LONG TERM GOAL #5   Title Patient to report 85% improvement in balance confidence with shower transfers.    Status Partially Met      PT LONG TERM GOAL #6   Title Patient to ambulate atleast 128f with 6 minute walk test with 4WW in order to improve functional activity tolerance and endurance.    Status On-going                 Plan - 08/04/20 1048    Clinical Impression Statement Patient arrived to session with report of remaining fatigue and increased knee pain since having a busy weekend last weekend. Vitals demonstrated slightly low diastolic BP, otherwise WFL. Patient reporting soreness in the R HS and calf, which was TTP and tight throughout. Proceeded with gentle HS and gastroc stretching with report of intense stretch but with good tolerance. Patient requesting limited standing activities today d/t knee pain. Advised patient to perform consistent 4WW use d/t c/o imbalance- patient reported understanding. Worked on hip and quad strengthening ther-ex with focus on rhythmic breathing and proper muscular recruitment. SLR's revealed remaining lack in TKE but no quad lag. Patient reported pain no worse at end of session.    Personal Factors and Comorbidities Age;Comorbidity 3+;Fitness;Past/Current Experience;Time since onset of  injury/illness/exacerbation    Comorbidities prostate CA with resection 2010, HTN, GERD, HLD, CKD, chronic systolic/diastolic HF, CHF, CAD, a-fib, anxiety, L medial partial knee arthroplasty 1990's    PT Frequency 2x / week    PT Duration 4 weeks    PT Treatment/Interventions ADLs/Self Care Home Management;Cryotherapy;Electrical Stimulation;Iontophoresis 413mml Dexamethasone;Moist Heat;Balance training;Therapeutic exercise;Therapeutic activities;Functional mobility training;Stair training;Gait training;Ultrasound;Neuromuscular re-education;Patient/family education;Manual techniques;Vestibular;Vasopneumatic Device;Taping;Energy conservation;Dry needling;Passive range of motion    PT Next Visit Plan progress LE strengthening as tolerated; Balance training working on [employer] Agree with Plan of Care Patient           Patient will benefit from skilled therapeutic intervention in order to improve the following deficits and impairments:  Abnormal gait,Decreased endurance,Increased edema,Pain,Increased fascial restricitons,Decreased strength,Decreased activity tolerance,Decreased balance,Difficulty walking,Improper body mechanics,Decreased range of motion,Impaired flexibility,Postural dysfunction  Visit Diagnosis: Chronic pain of right knee  Muscle weakness (generalized)  Unsteadiness on feet  Other symptoms and signs involving the musculoskeletal system  Other abnormalities of gait and mobility     Problem List Patient Active Problem List   Diagnosis Date Noted  . Pain of knee joint with osteochondral injury 07/21/2020  . Carcinoma of prostate ([facility]05/05/2020  . Congestive heart failure ([facility]05/05/2020  . Other psoriasis 06/30/2020  . Other specified counseling 06/30/2020  . 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 OA (osteoarthritis) of knee 01/08/2020  . Atypical atrial flutter ([facility] [place]  . Incidental pulmonary nodule 06/22/2014  . Chronic kidney disease   . S/P Maze  operation for atrial fibrillation 10/29/2013  . Penetrating atherosclerotic ulcer of aorta ([facility]08/26/2015  . Chronic atrial fibrillation ([facility]07/29/2015  . Dyslipidemia 07/15/2013  . CAD (coronary artery disease), native coronary artery 06/28/2013  . Dyspnea on exertion 06/26/2013  . History of prostate cancer 06/26/2013  . Chronic combined systolic and diastolic heart failure ([facility]04/30/2015  . NICM (nonischemic cardiomyopathy) with EF 20-25% 06/26/2013    Class: Diagnosis of  . Atrial Fibrillation  06/25/2013  . HTN (hypertension) 06/25/2013     Janene Harvey, PT, DPT 08/04/20 10:50 AM   [facility] [address]  [place], [place], [address] Phone: [phone]   Fax:  [phone]  Name: Jose Snyder MRN: [id number] Date of Birth: 1940/09/02

## 2020-08-04 NOTE — Addendum Note (Signed)
Addended by: Dellia Nims on: 08/04/2020 01:45 PM   Modules accepted: Orders

## 2020-08-05 ENCOUNTER — Encounter: Payer: Medicare Other | Admitting: Physical Therapy

## 2020-08-09 ENCOUNTER — Ambulatory Visit: Payer: Medicare Other | Admitting: Physical Therapy

## 2020-08-09 ENCOUNTER — Other Ambulatory Visit: Payer: Self-pay

## 2020-08-09 ENCOUNTER — Telehealth: Payer: Self-pay | Admitting: Family Medicine

## 2020-08-09 ENCOUNTER — Encounter: Payer: Self-pay | Admitting: Physical Therapy

## 2020-08-09 ENCOUNTER — Other Ambulatory Visit: Payer: Self-pay | Admitting: Cardiology

## 2020-08-09 VITALS — BP 122/70 | HR 78

## 2020-08-09 DIAGNOSIS — M25561 Pain in right knee: Secondary | ICD-10-CM | POA: Diagnosis not present

## 2020-08-09 DIAGNOSIS — R29898 Other symptoms and signs involving the musculoskeletal system: Secondary | ICD-10-CM | POA: Diagnosis not present

## 2020-08-09 DIAGNOSIS — R2689 Other abnormalities of gait and mobility: Secondary | ICD-10-CM

## 2020-08-09 DIAGNOSIS — M6281 Muscle weakness (generalized): Secondary | ICD-10-CM | POA: Diagnosis not present

## 2020-08-09 DIAGNOSIS — R2681 Unsteadiness on feet: Secondary | ICD-10-CM | POA: Diagnosis not present

## 2020-08-09 DIAGNOSIS — I480 Paroxysmal atrial fibrillation: Secondary | ICD-10-CM

## 2020-08-09 DIAGNOSIS — G8929 Other chronic pain: Secondary | ICD-10-CM | POA: Diagnosis not present

## 2020-08-09 MED ORDER — HYDROCODONE-ACETAMINOPHEN 5-325 MG PO TABS: 1.0000 | ORAL_TABLET | Freq: Three times a day (TID) | ORAL | 0 refills | Status: DC | PRN

## 2020-08-09 NOTE — Telephone Encounter (Signed)
Patient came by office to request refill of :  HYDROcodone-acetaminophen (NORCO/VICODIN) 5-325 MG tablet [[id number]]    Order Details Dose: 1 tablet Route: Oral Frequency: Every 8 hours PRN  Dispense Quantity: 15 tablet Refills: 0        Sig: Take 1 tablet by mouth every 8 (eight) hours as needed.        --Forwarding request to provider if approved send to :  [place] [place] [facility] [address]  [address], [place] [address]  Phone:  [phone]  Fax:  [phone]   --glh

## 2020-08-09 NOTE — Therapy (Signed)
[facility] [place], [place], [address] Phone: [phone]   Fax:  [phone]  Physical Therapy Treatment  Patient Details  Name: Jose Snyder MRN: [id number] Date of Birth: [birth year] Referring Provider (PT): Clearance Coots, MD   Encounter Date: 08/09/2020   PT End of Session - 08/09/20 0931     Visit Number 13    Number of Visits 18    Date for PT Re-Evaluation 08/27/20    Authorization Type Medicare & BCBS    PT Start Time [phone]    PT Stop Time 0929    PT Time Calculation (min) 46 min    Equipment Utilized During Treatment Gait belt    Activity Tolerance Patient tolerated treatment well;Patient limited by fatigue    Behavior During Therapy [facility] for tasks assessed/performed             Past Medical History:  Diagnosis Date   Anxiety    Arthritis    R hand- OA   Atrial Fibrillation  06/25/2013   CAD (coronary artery disease), native coronary artery 06/28/2013   a. LHC (06/27/13):  Mid LAD 40%, mid D1 75%, mid CFX 50-60%, mid RCA 50%, EF 20%, LVEDP 24 mmHg.   CHF (congestive heart failure) (HCC)    Chronic combined systolic and diastolic heart failure ([place]) 06/26/2013   a.  Echo (06/25/13):  EF 20-25%, diff HK, restrictive physio, mild MR, mod to severe LAE, mild RVE, mildly reduced RVSF, mod to severe RAE   Chronic kidney disease    Dyslipidemia 07/15/2013   Dysrhythmia    a-fib   GERD (gastroesophageal reflux disease)    Hypertension    Incidental pulmonary nodule 06/22/2014   Seen on CT angiogram of chest   NICM (nonischemic cardiomyopathy) with EF 20-25% 06/26/2013   Obesity    Penetrating atherosclerotic ulcer of aorta ([place]) 10/22/2013   Small penetrating ulcer of descending thoracic aorta discovered on routine CTA   Prostate cancer ([place])    watchful waiting, no treatment so far   S/P Maze operation for atrial fibrillation 10/29/2013   Complete bilateral atrial lesion set using cryothermy and  bipolar radiofrequency ablation with clipping of LA appendage via right mini thoracotomy   Shortness of breath    seen by Stanford Breed, & low energy     Past Surgical History:  Procedure Laterality Date   CARDIAC CATHETERIZATION  06/2013   CARDIOVERSION N/A 06/30/2013   Procedure: CARDIOVERSION;  Surgeon: [name], MD;  Location: [place];  Service: Cardiovascular;  Laterality: N/A;   CARDIOVERSION N/A 07/31/2013   Procedure: CARDIOVERSION;  Surgeon: Thayer Headings, MD;  Location: [place];  Service: Cardiovascular;  Laterality: N/A;   CARDIOVERSION N/A 03/19/2018   Procedure: CARDIOVERSION;  Surgeon: [name], MD;  Location: [place];  Service: Cardiovascular;  Laterality: N/A;   CARDIOVERSION N/A 07/24/2018   Procedure: CARDIOVERSION;  Surgeon: [name], MD;  Location: [facility] ENDOSCOPY;  Service: Cardiovascular;  Laterality: N/A;   CLIPPING OF ATRIAL APPENDAGE  10/29/2013   Procedure: CLIPPING OF ATRIAL APPENDAGE;  Surgeon: Rexene Alberts, MD;  Location: [place];  Service: Open Heart Surgery;;   INTRAOPERATIVE TRANSESOPHAGEAL ECHOCARDIOGRAM N/A 10/29/2013   Procedure: INTRAOPERATIVE TRANSESOPHAGEAL ECHOCARDIOGRAM;  Surgeon: Rexene Alberts, MD;  Location: [place];  Service: Open Heart Surgery;  Laterality: N/A;   LEFT AND RIGHT HEART CATHETERIZATION WITH CORONARY ANGIOGRAM N/A 06/27/2013   Procedure: LEFT AND RIGHT HEART CATHETERIZATION WITH CORONARY  ANGIOGRAM;  Surgeon: Corky Crafts, MD;  Location: [facility] CATH LAB;  Service: Cardiovascular;  Laterality: N/A;   MEDIAL PARTIAL KNEE REPLACEMENT Left 1990's?   MINIMALLY INVASIVE MAZE PROCEDURE N/A 10/29/2013   Procedure: MINIMALLY INVASIVE MAZE PROCEDURE;  Surgeon: Purcell Nails, MD;  Location: [facility] OR;  Service: Open Heart Surgery;  Laterality: N/A;   TEE WITHOUT CARDIOVERSION N/A 06/30/2013   Procedure: TRANSESOPHAGEAL ECHOCARDIOGRAM (TEE) ;  Surgeon: Quintella Reichert, MD;  Location: [facility] ENDOSCOPY;  Service: Cardiovascular;   Laterality: N/A;   TRANSURETHRAL RESECTION OF PROSTATE  2010    Vitals:   08/09/20 0845  BP: 122/70  Pulse: 78  SpO2: 97%     Subjective Assessment - 08/09/20 0847     Subjective Feeling "slow" and noting some SOB d/t the hunmidity. Also notes that his balance has been off today and yesterday. Denies chest pain and dizziness. Notes that he ran a lot of errands on Friday which wore him out.    Pertinent History prostate CA with resection 2010, HTN, GERD, HLD, CKD, chronic systolic/diastolic HF, CHF, CAD, a-fib, anxiety, L medial partial knee arthroplasty 1990's    Diagnostic tests 06/19/20 R knee MRI: Radial tear involving the posterior horn of the medial meniscus, probably remote large osteochondral lesion of medial femoral condyle    Patient Stated Goals decrease pain and increase strength    Currently in Pain? Yes    Pain Score 6     Pain Location Knee    Pain Orientation Right    Pain Descriptors / Indicators Aching;Dull    Pain Type Acute pain;Chronic pain                               [facility] Adult PT Treatment/Exercise - 08/09/20 0001       Knee/Hip Exercises: Stretches   Active Hamstring Stretch Right;2 reps;30 seconds    Active Hamstring Stretch Limitations + DF    Other Knee/Hip Stretches sitting R adductor stretch 2x30"      Knee/Hip Exercises: Aerobic   Nustep L5x78min (UEs/LEs)      Knee/Hip Exercises: Standing   Other Standing Knee Exercises sidestepping with red loop around ankles 4x length of counter   cues to stand up tall     Knee/Hip Exercises: Seated   Sit to Sand 5 reps;1 set;with UE support   standing on airex; cues for set up and anterior trunk lean; CGA                Balance Exercises - 08/09/20 0001       Balance Exercises: Standing   Tandem Gait Forward;4 reps   full hand/2 finger support; intermittent UE support   Marching Foam/compliant surface;20 reps   UE support; L ankle instability, requiring CGA   Other  Standing Exercises Comments grapevine along counter 2x   verbal and tactile cues required for coordination of movement and avoiding twisting/pivoting too much              PT Education - 08/09/20 0929     Education Details discussion on patient's symptoms- advised him to discuss at next F/U with PCP; encouraged patient to use 4WW consistently in the home while balance is "off"    Person(s) Educated Patient    Methods Explanation    Comprehension Verbalized understanding              PT Short Term Goals - 07/29/20 9650  PT SHORT TERM GOAL #1   Title Patient to be independent with initial HEP.    Status Achieved               PT Long Term Goals - 07/29/20 1950       PT LONG TERM GOAL #1   Title Patient to be independent with advanced HEP.    Status Partially Met      PT LONG TERM GOAL #2   Title Patient to demonstrate B LE strength >/=4+/5.    Status Achieved      PT LONG TERM GOAL #3   Title Patient to demonstrate R knee AROM/PROM 0-120 degrees.    Status Achieved      PT LONG TERM GOAL #4   Title Patient to score >/=48/56 on Berg in order to decrease risk of falls.    Status Partially Met      PT LONG TERM GOAL #5   Title Patient to report 85% improvement in balance confidence with shower transfers.    Status Partially Met      PT LONG TERM GOAL #6   Title Patient to ambulate atleast 1290ft with 6 minute walk test with 4WW in order to improve functional activity tolerance and endurance.    Status On-going                   Plan - 08/09/20 0931     Clinical Impression Statement Patient arrived to session with report of fatigue, SOB, and imbalance. Vitals appeared normal and patient denied chest pain and dizziness. Notes that he will be F/U with his MD later this month, thus encouraged him to discuss these recent issues with his PCP. Proceeded with session while monitoring for symptoms. Worked on LE stretching and progressive LE  strengthening. Patient performed STS on compliant surface with cueing for set up and demonstrating significant ankle instability. Patient required sitting rest breaks d/t fatigue between activities. Proceeded with standing dynamic balance training with counter support. Patient was able to demonstrate slight improvement in stability with tandem walk but still with intermittent LOB requiring CGA. Verbal and tactile cues were required for coordination of proper movement with grapevine and to avoiding twisting/pivoting too much to avoid knee pain. Encouraged patient to use 4WW consistently at home d/t imbalance. Patient reported understanding and without complaints at end of session.    Personal Factors and Comorbidities Age;Comorbidity 3+;Fitness;Past/Current Experience;Time since onset of injury/illness/exacerbation    Comorbidities prostate CA with resection 2010, HTN, GERD, HLD, CKD, chronic systolic/diastolic HF, CHF, CAD, a-fib, anxiety, L medial partial knee arthroplasty 1990's    PT Frequency 2x / week    PT Duration 4 weeks    PT Treatment/Interventions ADLs/Self Care Home Management;Cryotherapy;Electrical Stimulation;Iontophoresis 4mg /ml Dexamethasone;Moist Heat;Balance training;Therapeutic exercise;Therapeutic activities;Functional mobility training;Stair training;Gait training;Ultrasound;Neuromuscular re-education;Patient/family education;Manual techniques;Vestibular;Vasopneumatic Device;Taping;Energy conservation;Dry needling;Passive range of motion    PT Next Visit Plan progress LE strengthening as tolerated; Balance training working on [employer] and Agree with Plan of Care Patient             Patient will benefit from skilled therapeutic intervention in order to improve the following deficits and impairments:  Abnormal gait, Decreased endurance, Increased edema, Pain, Increased fascial restricitons, Decreased strength, Decreased activity tolerance, Decreased balance, Difficulty  walking, Improper body mechanics, Decreased range of motion, Impaired flexibility, Postural dysfunction  Visit Diagnosis: Chronic pain of right knee  Muscle weakness (generalized)  Unsteadiness on feet  Other symptoms and signs involving the  musculoskeletal system  Other abnormalities of gait and mobility     Problem List Patient Active Problem List   Diagnosis Date Noted   Pain of knee joint with osteochondral injury 07/21/2020   Carcinoma of prostate ([place]) 06/30/2020   Congestive heart failure ([place]) 06/30/2020   Other psoriasis 06/30/2020   Other specified counseling 06/30/2020   Subchondral bone cyst 06/09/2020   OA (osteoarthritis) of knee 01/08/2020   Atypical atrial flutter (HCC)    Incidental pulmonary nodule 06/22/2014   Chronic kidney disease    S/P Maze operation for atrial fibrillation 10/29/2013   Penetrating atherosclerotic ulcer of aorta ([place]) 10/22/2013   Chronic atrial fibrillation ([place]) 09/24/2013   Dyslipidemia 07/15/2013   CAD (coronary artery disease), native coronary artery 06/28/2013   Dyspnea on exertion 06/26/2013   History of prostate cancer 06/26/2013   Chronic combined systolic and diastolic heart failure ([place]) 06/26/2013   NICM (nonischemic cardiomyopathy) with EF 20-25% 06/26/2013    Class: Diagnosis of   Atrial Fibrillation  06/25/2013   HTN (hypertension) 06/25/2013     Janene Harvey, PT, DPT 08/09/20 9:33 AM   [facility] [address]  [place], [place], [address] Phone: [phone]   Fax:  [phone]  Name: Jose Snyder MRN: [id number] Date of Birth: 02-17-1941

## 2020-08-09 NOTE — Telephone Encounter (Signed)
Refilled norco.   Rosemarie Ax, MD [facility] 08/09/2020, 9:58 AM

## 2020-08-12 ENCOUNTER — Ambulatory Visit: Payer: Medicare Other

## 2020-08-12 ENCOUNTER — Other Ambulatory Visit: Payer: Self-pay

## 2020-08-12 DIAGNOSIS — M25561 Pain in right knee: Secondary | ICD-10-CM | POA: Diagnosis not present

## 2020-08-12 DIAGNOSIS — R29898 Other symptoms and signs involving the musculoskeletal system: Secondary | ICD-10-CM | POA: Diagnosis not present

## 2020-08-12 DIAGNOSIS — R2681 Unsteadiness on feet: Secondary | ICD-10-CM | POA: Diagnosis not present

## 2020-08-12 DIAGNOSIS — R2689 Other abnormalities of gait and mobility: Secondary | ICD-10-CM | POA: Diagnosis not present

## 2020-08-12 DIAGNOSIS — M6281 Muscle weakness (generalized): Secondary | ICD-10-CM | POA: Diagnosis not present

## 2020-08-12 DIAGNOSIS — G8929 Other chronic pain: Secondary | ICD-10-CM | POA: Diagnosis not present

## 2020-08-12 NOTE — Therapy (Signed)
[facility] [address]  [place], [place], [address] Phone: [phone]   Fax:  [phone]  Physical Therapy Treatment  Patient Details  Name: Jose Snyder MRN: [id number] Date of Birth: [birth year] Referring Provider (PT): Clearance Coots, MD   Encounter Date: 08/12/2020   PT End of Session - 08/12/20 0841     Visit Number 15    Number of Visits 18    Date for PT Re-Evaluation 08/27/20    Authorization Type Medicare & BCBS    PT Start Time 0801    PT Stop Time 0842    PT Time Calculation (min) 41 min    Equipment Utilized During Treatment Gait belt    Activity Tolerance Patient tolerated treatment well;Patient limited by fatigue    Behavior During Therapy [facility] for tasks assessed/performed             Past Medical History:  Diagnosis Date   Anxiety    Arthritis    R hand- OA   Atrial Fibrillation  06/25/2013   CAD (coronary artery disease), native coronary artery 06/28/2013   a. LHC (06/27/13):  Mid LAD 40%, mid D1 75%, mid CFX 50-60%, mid RCA 50%, EF 20%, LVEDP 24 mmHg.   CHF (congestive heart failure) (HCC)    Chronic combined systolic and diastolic heart failure ([place]) 06/26/2013   a.  Echo (06/25/13):  EF 20-25%, diff HK, restrictive physio, mild MR, mod to severe LAE, mild RVE, mildly reduced RVSF, mod to severe RAE   Chronic kidney disease    Dyslipidemia 07/15/2013   Dysrhythmia    a-fib   GERD (gastroesophageal reflux disease)    Hypertension    Incidental pulmonary nodule 06/22/2014   Seen on CT angiogram of chest   NICM (nonischemic cardiomyopathy) with EF 20-25% 06/26/2013   Obesity    Penetrating atherosclerotic ulcer of aorta ([place]) 10/22/2013   Small penetrating ulcer of descending thoracic aorta discovered on routine CTA   Prostate cancer ([place])    watchful waiting, no treatment so far   S/P Maze operation for atrial fibrillation 10/29/2013   Complete bilateral atrial lesion set using cryothermy and  bipolar radiofrequency ablation with clipping of LA appendage via right mini thoracotomy   Shortness of breath    seen by Stanford Breed, & low energy     Past Surgical History:  Procedure Laterality Date   CARDIAC CATHETERIZATION  06/2013   CARDIOVERSION N/A 06/30/2013   Procedure: CARDIOVERSION;  Surgeon: [name], MD;  Location: [place];  Service: Cardiovascular;  Laterality: N/A;   CARDIOVERSION N/A 07/31/2013   Procedure: CARDIOVERSION;  Surgeon: Thayer Headings, MD;  Location: [place];  Service: Cardiovascular;  Laterality: N/A;   CARDIOVERSION N/A 03/19/2018   Procedure: CARDIOVERSION;  Surgeon: [name], MD;  Location: [place];  Service: Cardiovascular;  Laterality: N/A;   CARDIOVERSION N/A 07/24/2018   Procedure: CARDIOVERSION;  Surgeon: [name], MD;  Location: [facility] ENDOSCOPY;  Service: Cardiovascular;  Laterality: N/A;   CLIPPING OF ATRIAL APPENDAGE  10/29/2013   Procedure: CLIPPING OF ATRIAL APPENDAGE;  Surgeon: Rexene Alberts, MD;  Location: [place];  Service: Open Heart Surgery;;   INTRAOPERATIVE TRANSESOPHAGEAL ECHOCARDIOGRAM N/A 10/29/2013   Procedure: INTRAOPERATIVE TRANSESOPHAGEAL ECHOCARDIOGRAM;  Surgeon: Rexene Alberts, MD;  Location: [place];  Service: Open Heart Surgery;  Laterality: N/A;   LEFT AND RIGHT HEART CATHETERIZATION WITH CORONARY ANGIOGRAM N/A 06/27/2013   Procedure: LEFT AND RIGHT HEART CATHETERIZATION WITH CORONARY  ANGIOGRAM;  Surgeon: Jettie Booze, MD;  Location: [facility] CATH LAB;  Service: Cardiovascular;  Laterality: N/A;   MEDIAL PARTIAL KNEE REPLACEMENT Left 1990's?   MINIMALLY INVASIVE MAZE PROCEDURE N/A 10/29/2013   Procedure: MINIMALLY INVASIVE MAZE PROCEDURE;  Surgeon: Rexene Alberts, MD;  Location: [place];  Service: Open Heart Surgery;  Laterality: N/A;   TEE WITHOUT CARDIOVERSION N/A 06/30/2013   Procedure: TRANSESOPHAGEAL ECHOCARDIOGRAM (TEE) ;  Surgeon: [name], MD;  Location: [place];  Service: Cardiovascular;   Laterality: N/A;   TRANSURETHRAL RESECTION OF PROSTATE  2010    There were no vitals filed for this visit.   Subjective Assessment - 08/12/20 0803     Subjective Pt reports feeling soreness along the lateral quads today and still continued SOB d/t the air humidity.    Pertinent History prostate CA with resection 2010, HTN, GERD, HLD, CKD, chronic systolic/diastolic HF, CHF, CAD, a-fib, anxiety, L medial partial knee arthroplasty 1990's    Diagnostic tests 06/19/20 R knee MRI: Radial tear involving the posterior horn of the medial meniscus, probably remote large osteochondral lesion of medial femoral condyle    Patient Stated Goals decrease pain and increase strength    Currently in Pain? Yes    Pain Score 5     Pain Location Knee    Pain Orientation Right    Pain Descriptors / Indicators Aching;Dull    Pain Type Acute pain;Chronic pain                               [facility] Adult PT Treatment/Exercise - 08/12/20 0001       Knee/Hip Exercises: Stretches   Active Hamstring Stretch Right;2 reps;30 seconds    Active Hamstring Stretch Limitations + DF    Hip Flexor Stretch Right;2 reps;30 seconds    Hip Flexor Stretch Limitations mod thomas position with strap      Knee/Hip Exercises: Aerobic   Nustep L5x52mn (UEs/LEs)      Knee/Hip Exercises: Standing   Hip ADduction Strengthening;Both;10 reps    Hip ADduction Limitations 2# cuff weights; counter support 2 HHA    Hip Abduction Stengthening;Both;10 reps;Knee straight    Abduction Limitations 2# cuff weights; counter support 2 HHA      Knee/Hip Exercises: Supine   Heel Slides Strengthening;Right;10 reps    Heel Slides Limitations 2# cuff weight    Bridges Strengthening;Both;2 sets;5 reps    Bridges Limitations red TB; fatigue after rep 5    Straight Leg Raises Strengthening;Right;10 reps    Straight Leg Raises Limitations 2#                 Balance Exercises - 08/12/20 0001       Balance  Exercises: Standing   Marching Solid surface;Foam/compliant surface;Upper extremity assist 2;Static;10 reps   10 reps on solid ground; 10 reps from airex; less stance time on the R leg                PT Short Term Goals - 07/29/20 0941       PT SHORT TERM GOAL #1   Title Patient to be independent with initial HEP.    Status Achieved               PT Long Term Goals - 08/12/20 06283      PT LONG TERM GOAL #1   Title Patient to be independent with advanced HEP.    Status  Partially Met      PT LONG TERM GOAL #2   Title Patient to demonstrate B LE strength >/=4+/5.    Status Achieved      PT LONG TERM GOAL #3   Title Patient to demonstrate R knee AROM/PROM 0-120 degrees.    Status Achieved      PT LONG TERM GOAL #4   Title Patient to score >/=48/56 on Berg in order to decrease risk of falls.    Status Partially Met      PT LONG TERM GOAL #5   Title Patient to report 85% improvement in balance confidence with shower transfers.    Status Partially Met   50% improvement per patient statement     PT LONG TERM GOAL #6   Title Patient to ambulate atleast 1221f with 6 minute walk test with 4WW in order to improve functional activity tolerance and endurance.    Status On-going                   Plan - 08/12/20 0844     Clinical Impression Statement Pt still reports SOB and difficulty breathing d/t the humdity lately. He did report that he has not been as off balance lately. He showed fatigue during the standing ther ex and required prolonged rest periods d/t fatigue. he also showed decreased stance time on the R leg during exercises. Did some hip flexor stretching d/t him reporting soreness along his anterior and lateral quads. No complaints with any of the supine ther ex but fatigue with the SLRs. Cues required to isolate the targted muscles and prevent substitutions. Pt also reports 50% improvement in his confidence with balance getting in and out of the  shower. Pt responded well and is making progress toward LTG 5.    Personal Factors and Comorbidities Age;Comorbidity 3+;Fitness;Past/Current Experience;Time since onset of injury/illness/exacerbation    Comorbidities prostate CA with resection 2010, HTN, GERD, HLD, CKD, chronic systolic/diastolic HF, CHF, CAD, a-fib, anxiety, L medial partial knee arthroplasty 1990's    PT Frequency 2x / week    PT Duration 4 weeks    PT Treatment/Interventions ADLs/Self Care Home Management;Cryotherapy;Electrical Stimulation;Iontophoresis 480mml Dexamethasone;Moist Heat;Balance training;Therapeutic exercise;Therapeutic activities;Functional mobility training;Stair training;Gait training;Ultrasound;Neuromuscular re-education;Patient/family education;Manual techniques;Vestibular;Vasopneumatic Device;Taping;Energy conservation;Dry needling;Passive range of motion    PT Next Visit Plan progress LE strengthening as tolerated; Balance training working on [employer] Agree with Plan of Care Patient             Patient will benefit from skilled therapeutic intervention in order to improve the following deficits and impairments:  Abnormal gait, Decreased endurance, Increased edema, Pain, Increased fascial restricitons, Decreased strength, Decreased activity tolerance, Decreased balance, Difficulty walking, Improper body mechanics, Decreased range of motion, Impaired flexibility, Postural dysfunction  Visit Diagnosis: Chronic pain of right knee  Muscle weakness (generalized)  Unsteadiness on feet  Other symptoms and signs involving the musculoskeletal system  Other abnormalities of gait and mobility     Problem List Patient Active Problem List   Diagnosis Date Noted   Pain of knee joint with osteochondral injury 07/21/2020   C[place] ([facility] [place]/05[place]   [place]ive [place] ([place]/05/[place]   Other psoriasis 06/30/2020   Other specified counseling 06/30/2020   Subchondral bone  cyst 06/09/2020   OA (osteoarthritis) of knee 01/08/2020   Atypical atrial flutter (HCC)    Incidental pulmonary nodule 06/22/2014   Chronic kidney disease    S/P Maze operation for atrial fibrillation  10/29/2013   Penetrating atherosclerotic ulcer of aorta ([place]) 10/22/2013   Chronic atrial fibrillation ([place]) 09/24/2013   Dyslipidemia 07/15/2013   CAD (coronary artery disease), native coronary artery 06/28/2013   Dyspnea on exertion 06/26/2013   History of prostate cancer 06/26/2013   Chronic combined systolic and diastolic heart failure ([place]) 06/26/2013   NICM (nonischemic cardiomyopathy) with EF 20-25% 06/26/2013    Class: Diagnosis of   Atrial Fibrillation  06/25/2013   HTN (hypertension) 06/25/2013    Artist Pais, PTA 08/12/2020, 8:53 AM  [facility] [address]  [place], [place], [address] Phone: [phone]   Fax:  [phone]  Name: Alford Gamero MRN: [id number] Date of Birth: 19-Oct-1940

## 2020-08-23 ENCOUNTER — Other Ambulatory Visit: Payer: Self-pay

## 2020-08-23 ENCOUNTER — Ambulatory Visit: Payer: Medicare Other | Admitting: Family Medicine

## 2020-08-23 ENCOUNTER — Ambulatory Visit: Payer: Medicare Other | Admitting: Physical Therapy

## 2020-08-23 ENCOUNTER — Telehealth: Payer: Self-pay | Admitting: Family Medicine

## 2020-08-23 ENCOUNTER — Encounter: Payer: Self-pay | Admitting: Physical Therapy

## 2020-08-23 DIAGNOSIS — M6281 Muscle weakness (generalized): Secondary | ICD-10-CM

## 2020-08-23 DIAGNOSIS — R2689 Other abnormalities of gait and mobility: Secondary | ICD-10-CM

## 2020-08-23 DIAGNOSIS — R2681 Unsteadiness on feet: Secondary | ICD-10-CM | POA: Diagnosis not present

## 2020-08-23 DIAGNOSIS — R29898 Other symptoms and signs involving the musculoskeletal system: Secondary | ICD-10-CM

## 2020-08-23 DIAGNOSIS — M25561 Pain in right knee: Secondary | ICD-10-CM

## 2020-08-23 DIAGNOSIS — G8929 Other chronic pain: Secondary | ICD-10-CM | POA: Diagnosis not present

## 2020-08-23 MED ORDER — HYDROCODONE-ACETAMINOPHEN 5-325 MG PO TABS: 1.0000 | ORAL_TABLET | Freq: Three times a day (TID) | ORAL | 0 refills | Status: DC | PRN

## 2020-08-23 NOTE — Therapy (Signed)
[facility] [address]  [place], [place], [address] Phone: [phone]   Fax:  [phone]  Physical Therapy Treatment  Patient Details  Name: Jose Snyder MRN: [id number] Date of Birth: 06-11-1940 Referring Provider (PT): Clearance Coots, MD   Encounter Date: 08/23/2020   PT End of Session - 08/23/20 1101     Visit Number 15   corrected from last session   Number of Visits 27    Date for PT Re-Evaluation 10/04/20    Authorization Type Medicare & BCBS    PT Start Time 1014    PT Stop Time 1056    PT Time Calculation (min) 42 min    Equipment Utilized During Treatment Gait belt    Activity Tolerance Patient tolerated treatment well;Patient limited by fatigue    Behavior During Therapy WFL for tasks assessed/performed             Past Medical History:  Diagnosis Date   Anxiety    Arthritis    R hand- OA   Atrial Fibrillation  06/25/2013   CAD (coronary artery disease), native coronary artery 06/28/2013   a. LHC (06/27/13):  Mid LAD 40%, mid D1 75%, mid CFX 50-60%, mid RCA 50%, EF 20%, LVEDP 24 mmHg.   CHF (congestive heart failure) (HCC)    Chronic combined systolic and diastolic heart failure ([place]) 06/26/2013   a.  Echo (06/25/13):  EF 20-25%, diff HK, restrictive physio, mild MR, mod to severe LAE, mild RVE, mildly reduced RVSF, mod to severe RAE   Chronic kidney disease    Dyslipidemia 07/15/2013   Dysrhythmia    a-fib   GERD (gastroesophageal reflux disease)    Hypertension    Incidental pulmonary nodule 06/22/2014   Seen on CT angiogram of chest   NICM (nonischemic cardiomyopathy) with EF 20-25% 06/26/2013   Obesity    Penetrating atherosclerotic ulcer of aorta ([place]) 10/22/2013   Small penetrating ulcer of descending thoracic aorta discovered on routine CTA   Prostate cancer ([place])    watchful waiting, no treatment so far   S/P Maze operation for atrial fibrillation 10/29/2013   Complete bilateral atrial  lesion set using cryothermy and bipolar radiofrequency ablation with clipping of LA appendage via right mini thoracotomy   Shortness of breath    seen by Stanford Breed, & low energy     Past Surgical History:  Procedure Laterality Date   CARDIAC CATHETERIZATION  06/2013   CARDIOVERSION N/A 06/30/2013   Procedure: CARDIOVERSION;  Surgeon: [name], MD;  Location: [facility];  Service: Cardiovascular;  Laterality: N/A;   CARDIOVERSION N/A 07/31/2013   Procedure: CARDIOVERSION;  Surgeon: Thayer Headings, MD;  Location: [facility];  Service: Cardiovascular;  Laterality: N/A;   CARDIOVERSION N/A 03/19/2018   Procedure: CARDIOVERSION;  Surgeon: [name], MD;  Location: [facility];  Service: Cardiovascular;  Laterality: N/A;   CARDIOVERSION N/A 07/24/2018   Procedure: CARDIOVERSION;  Surgeon: [name], MD;  Location: [facility] ENDOSCOPY;  Service: Cardiovascular;  Laterality: N/A;   CLIPPING OF ATRIAL APPENDAGE  10/29/2013   Procedure: CLIPPING OF ATRIAL APPENDAGE;  Surgeon: Rexene Alberts, MD;  Location: [facility];  Service: Open Heart Surgery;;   INTRAOPERATIVE TRANSESOPHAGEAL ECHOCARDIOGRAM N/A 10/29/2013   Procedure: INTRAOPERATIVE TRANSESOPHAGEAL ECHOCARDIOGRAM;  Surgeon: Rexene Alberts, MD;  Location: [facility];  Service: Open Heart Surgery;  Laterality: N/A;   LEFT AND RIGHT HEART CATHETERIZATION WITH CORONARY ANGIOGRAM N/A 06/27/2013   Procedure: LEFT AND  RIGHT HEART CATHETERIZATION WITH CORONARY ANGIOGRAM;  Surgeon: Jettie Booze, MD;  Location: [facility] CATH LAB;  Service: Cardiovascular;  Laterality: N/A;   MEDIAL PARTIAL KNEE REPLACEMENT Left 1990's?   MINIMALLY INVASIVE MAZE PROCEDURE N/A 10/29/2013   Procedure: MINIMALLY INVASIVE MAZE PROCEDURE;  Surgeon: Rexene Alberts, MD;  Location: [facility];  Service: Open Heart Surgery;  Laterality: N/A;   TEE WITHOUT CARDIOVERSION N/A 06/30/2013   Procedure: TRANSESOPHAGEAL ECHOCARDIOGRAM (TEE) ;  Surgeon: [name], MD;  Location: [facility];   Service: Cardiovascular;  Laterality: N/A;   TRANSURETHRAL RESECTION OF PROSTATE  2010    There were no vitals filed for this visit.   Subjective Assessment - 08/23/20 1016     Subjective Feels stiff this AM and requests some stretching today as this helped last session. Noticing B ankle swelling today d/t eating too much sodium over the weekend. Feels like his balance is improving.    Pertinent History prostate CA with resection 2010, HTN, GERD, HLD, CKD, chronic systolic/diastolic HF, CHF, CAD, a-fib, anxiety, L medial partial knee arthroplasty 1990's    Diagnostic tests 06/19/20 R knee MRI: Radial tear involving the posterior horn of the medial meniscus, probably remote large osteochondral lesion of medial femoral condyle    Patient Stated Goals decrease pain and increase strength    Currently in Pain? Yes    Pain Score 4     Pain Location Knee    Pain Orientation Right    Pain Descriptors / Indicators Aching;Dull    Pain Type Acute pain;Chronic pain                [facility] PT Assessment - 08/23/20 1034       Assessment   Medical Diagnosis Insufficiency fx of medial femoral condyle    Referring Provider (PT) Clearance Coots, MD      Berg Balance Test   Sit to Stand Able to stand without using hands and stabilize independently    Standing Unsupported Able to stand safely 2 minutes    Sitting with Back Unsupported but Feet Supported on Floor or Stool Able to sit safely and securely 2 minutes    Stand to Sit Sits safely with minimal use of hands    Transfers Able to transfer safely, minor use of hands    Standing Unsupported with Eyes Closed Able to stand 10 seconds safely    Standing Unsupported with Feet Together Able to place feet together independently and stand 1 minute safely    From Standing, Reach Forward with Outstretched Arm Can reach confidently >25 cm (10")    From Standing Position, Pick up Object from Floor Able to pick up shoe, needs supervision    From Standing  Position, Turn to Look Behind Over each Shoulder Looks behind from both sides and weight shifts well    Turn 360 Degrees Able to turn 360 degrees safely in 4 seconds or less    Standing Unsupported, Alternately Place Feet on Step/Stool Able to complete 4 steps without aid or supervision    Standing Unsupported, One Foot in Front Loses balance while stepping or standing    Standing on One Leg Tries to lift leg/unable to hold 3 seconds but remains standing independently    Total Score 46                           [facility] Adult PT Treatment/Exercise - 08/23/20 0001       Knee/Hip  Exercises: Stretches   Active Hamstring Stretch Right;2 reps;30 seconds    Active Hamstring Stretch Limitations + DF w/ strap and stool    Hip Flexor Stretch Right;2 reps;30 seconds;Left    Hip Flexor Stretch Limitations mod thomas position with strap   cues for optimal positioning     Knee/Hip Exercises: Aerobic   Nustep L5x43min (UEs/LEs)                    PT Education - 08/23/20 1100     Education Details update to HEP- to be performed at counter top for safety; discussion on balance testing and remaining deficits    Person(s) Educated Patient    Methods Explanation;Demonstration;Tactile cues;Verbal cues;Handout    Comprehension Verbalized understanding;Returned demonstration              PT Short Term Goals - 08/23/20 1103       PT SHORT TERM GOAL #1   Title Patient to be independent with initial HEP.    Status Achieved               PT Long Term Goals - 08/23/20 1048       PT LONG TERM GOAL #1   Title Patient to be independent with advanced HEP.    Time 6    Status Partially Met   met for current   Target Date 10/04/20      PT LONG TERM GOAL #2   Title Patient to demonstrate B LE strength >/=4+/5.    Status Achieved      PT LONG TERM GOAL #3   Title Patient to demonstrate R knee AROM/PROM 0-120 degrees.    Status Achieved      PT LONG TERM GOAL #4    Title Patient to score >/=48/56 on Berg in order to decrease risk of falls.    Time 6    Status Partially Met   08/23/20: 46/56   Target Date 10/04/20      PT LONG TERM GOAL #5   Title Patient to report 85% improvement in balance confidence with shower transfers.    Time 6    Status Partially Met   reports 40-50% improvement   Target Date 10/04/20      PT LONG TERM GOAL #6   Title Patient to ambulate atleast 1223ft with 6 minute walk test with 4WW in order to improve functional activity tolerance and endurance.    Status On-going   deferred d/t fatigue                  Plan - 08/23/20 1102     Clinical Impression Statement Patient arrived to session with request for some LE stretching as he feels that this helped with his stiffness last session. Noting some B ankle swelling d/t increased sodium intake over the weekend. Patient reports that he feels like his balance is improving, and particularly notes 40-50% improvement in balance confidence when stepping into the shower. Patient performed LE stretching with cueing for optimal positioning, which patient reported good tolerance/benefit from. Patient scored 46/56 on Merrilee Jansky today which reveals good improvement in balance. Patient still with most difficulty with narrow BOS activities. Deferred 6 minute walk test d/t fatigue. Reviewed standing balance exercises to be performed at counter top for continued practice. Patient reported understanding and without complaints at end of session. Patient is demonstrating good progress towards goals. Would benefit from additional skilled PT services 2x/week for 6 weeks to address remaining goals.  Personal Factors and Comorbidities Age;Comorbidity 3+;Fitness;Past/Current Experience;Time since onset of injury/illness/exacerbation    Comorbidities prostate CA with resection 2010, HTN, GERD, HLD, CKD, chronic systolic/diastolic HF, CHF, CAD, a-fib, anxiety, L medial partial knee arthroplasty 1990's     PT Frequency 2x / week    PT Duration 6 weeks    PT Treatment/Interventions ADLs/Self Care Home Management;Cryotherapy;Electrical Stimulation;Iontophoresis 4mg /ml Dexamethasone;Moist Heat;Balance training;Therapeutic exercise;Therapeutic activities;Functional mobility training;Stair training;Gait training;Ultrasound;Neuromuscular re-education;Patient/family education;Manual techniques;Vestibular;Vasopneumatic Device;Taping;Energy conservation;Dry needling;Passive range of motion    PT Next Visit Plan 6 minute walk next session; progress LE strengthening as tolerated; Balance training working on SLS and narrow BOS    Consulted and Agree with Plan of Care Patient             Patient will benefit from skilled therapeutic intervention in order to improve the following deficits and impairments:  Abnormal gait, Decreased endurance, Increased edema, Pain, Increased fascial restricitons, Decreased strength, Decreased activity tolerance, Decreased balance, Difficulty walking, Improper body mechanics, Decreased range of motion, Impaired flexibility, Postural dysfunction  Visit Diagnosis: Chronic pain of right knee  Muscle weakness (generalized)  Unsteadiness on feet  Other symptoms and signs involving the musculoskeletal system  Other abnormalities of gait and mobility     Problem List Patient Active Problem List   Diagnosis Date Noted   Pain of knee joint with osteochondral injury 07/21/2020   Carcinoma of prostate ([place]) 06/30/2020   Congestive heart failure ([place]) 06/30/2020   Other psoriasis 06/30/2020   Other specified counseling 06/30/2020   Subchondral bone cyst 06/09/2020   OA (osteoarthritis) of knee 01/08/2020   Atypical atrial flutter (HCC)    Incidental pulmonary nodule 06/22/2014   Chronic kidney disease    S/P Maze operation for atrial fibrillation 10/29/2013   Penetrating atherosclerotic ulcer of aorta ([place]) 10/22/2013   Chronic atrial fibrillation ([place]) 09/24/2013    Dyslipidemia 07/15/2013   CAD (coronary artery disease), native coronary artery 06/28/2013   Dyspnea on exertion 06/26/2013   History of prostate cancer 06/26/2013   Chronic combined systolic and diastolic heart failure ([place]) 06/26/2013   NICM (nonischemic cardiomyopathy) with EF 20-25% 06/26/2013    Class: Diagnosis of   Atrial Fibrillation  06/25/2013   HTN (hypertension) 06/25/2013     Janene Harvey, PT, DPT 08/23/20 11:07 AM    [facility] [address]  [place], [place], [address] Phone: [phone]   Fax:  [phone]  Name: Jose Snyder MRN: [id number] Date of Birth: [birth year]

## 2020-08-23 NOTE — Telephone Encounter (Signed)
Pt stopped by office states needs refill on:   HYDROcodone-acetaminophen (NORCO/VICODIN) 5-325 MG tablet [[id number]]    Order Details Dose: 1 tablet Route: Oral Frequency: Every 8 hours PRN  Dispense Quantity: 15 tablet Refills: 0        Sig: Take 1 tablet by mouth every 8 (eight) hours as needed.  - -Plse send refill order to :  [facility], [place] - [address]  [address] [place], [place]  [address]  Phone:  [phone]  Fax:  [phone]

## 2020-08-23 NOTE — Telephone Encounter (Signed)
Refilled norco.   Rosemarie Ax, MD [facility] 08/23/2020, 1:06 PM

## 2020-08-25 ENCOUNTER — Other Ambulatory Visit: Payer: Self-pay | Admitting: Cardiology

## 2020-08-25 ENCOUNTER — Ambulatory Visit: Payer: Medicare Other

## 2020-08-25 ENCOUNTER — Other Ambulatory Visit: Payer: Self-pay

## 2020-08-25 DIAGNOSIS — M6281 Muscle weakness (generalized): Secondary | ICD-10-CM

## 2020-08-25 DIAGNOSIS — I480 Paroxysmal atrial fibrillation: Secondary | ICD-10-CM

## 2020-08-25 DIAGNOSIS — R2681 Unsteadiness on feet: Secondary | ICD-10-CM | POA: Diagnosis not present

## 2020-08-25 DIAGNOSIS — R2689 Other abnormalities of gait and mobility: Secondary | ICD-10-CM

## 2020-08-25 DIAGNOSIS — G8929 Other chronic pain: Secondary | ICD-10-CM | POA: Diagnosis not present

## 2020-08-25 DIAGNOSIS — R29898 Other symptoms and signs involving the musculoskeletal system: Secondary | ICD-10-CM

## 2020-08-25 DIAGNOSIS — M25561 Pain in right knee: Secondary | ICD-10-CM | POA: Diagnosis not present

## 2020-08-25 NOTE — Therapy (Signed)
[facility] [address]  [place], [place], [address] Phone: [phone]   Fax:  [phone]  Physical Therapy Treatment  Patient Details  Name: Jose Snyder MRN: [id number] Date of Birth: 1940/08/17 Referring Provider (PT): Clearance Coots, MD   Encounter Date: 08/25/2020   PT End of Session - 08/25/20 1102     Visit Number 16    Number of Visits 27    Date for PT Re-Evaluation 10/04/20    Authorization Type Medicare & BCBS    PT Start Time 1018    PT Stop Time 1056    PT Time Calculation (min) 38 min    Activity Tolerance Patient tolerated treatment well;Patient limited by fatigue    Behavior During Therapy [facility] for tasks assessed/performed             Past Medical History:  Diagnosis Date   Anxiety    Arthritis    R hand- OA   Atrial Fibrillation  06/25/2013   CAD (coronary artery disease), native coronary artery 06/28/2013   a. LHC (06/27/13):  Mid LAD 40%, mid D1 75%, mid CFX 50-60%, mid RCA 50%, EF 20%, LVEDP 24 mmHg.   CHF (congestive heart failure) (HCC)    Chronic combined systolic and diastolic heart failure ([place]) 06/26/2013   a.  Echo (06/25/13):  EF 20-25%, diff HK, restrictive physio, mild MR, mod to severe LAE, mild RVE, mildly reduced RVSF, mod to severe RAE   Chronic kidney disease    Dyslipidemia 07/15/2013   Dysrhythmia    a-fib   GERD (gastroesophageal reflux disease)    Hypertension    Incidental pulmonary nodule 06/22/2014   Seen on CT angiogram of chest   NICM (nonischemic cardiomyopathy) with EF 20-25% 06/26/2013   Obesity    Penetrating atherosclerotic ulcer of aorta ([place]) 10/22/2013   Small penetrating ulcer of descending thoracic aorta discovered on routine CTA   Prostate cancer ([place])    watchful waiting, no treatment so far   S/P Maze operation for atrial fibrillation 10/29/2013   Complete bilateral atrial lesion set using cryothermy and bipolar radiofrequency ablation with clipping of  LA appendage via right mini thoracotomy   Shortness of breath    seen by Stanford Breed, & low energy     Past Surgical History:  Procedure Laterality Date   CARDIAC CATHETERIZATION  06/2013   CARDIOVERSION N/A 06/30/2013   Procedure: CARDIOVERSION;  Surgeon: [name], MD;  Location: [facility];  Service: Cardiovascular;  Laterality: N/A;   CARDIOVERSION N/A 07/31/2013   Procedure: CARDIOVERSION;  Surgeon: Thayer Headings, MD;  Location: [place];  Service: Cardiovascular;  Laterality: N/A;   CARDIOVERSION N/A 03/19/2018   Procedure: CARDIOVERSION;  Surgeon: [name], MD;  Location: [facility];  Service: Cardiovascular;  Laterality: N/A;   CARDIOVERSION N/A 07/24/2018   Procedure: CARDIOVERSION;  Surgeon: [name], MD;  Location: [facility] ENDOSCOPY;  Service: Cardiovascular;  Laterality: N/A;   CLIPPING OF ATRIAL APPENDAGE  10/29/2013   Procedure: CLIPPING OF ATRIAL APPENDAGE;  Surgeon: Rexene Alberts, MD;  Location: [facility];  Service: Open Heart Surgery;;   INTRAOPERATIVE TRANSESOPHAGEAL ECHOCARDIOGRAM N/A 10/29/2013   Procedure: INTRAOPERATIVE TRANSESOPHAGEAL ECHOCARDIOGRAM;  Surgeon: Rexene Alberts, MD;  Location: [facility];  Service: Open Heart Surgery;  Laterality: N/A;   LEFT AND RIGHT HEART CATHETERIZATION WITH CORONARY ANGIOGRAM N/A 06/27/2013   Procedure: LEFT AND RIGHT HEART CATHETERIZATION WITH CORONARY ANGIOGRAM;  Surgeon: Jettie Booze, MD;  Location:  [facility] CATH LAB;  Service: Cardiovascular;  Laterality: N/A;   MEDIAL PARTIAL KNEE REPLACEMENT Left 1990's?   MINIMALLY INVASIVE MAZE PROCEDURE N/A 10/29/2013   Procedure: MINIMALLY INVASIVE MAZE PROCEDURE;  Surgeon: Rexene Alberts, MD;  Location: [facility];  Service: Open Heart Surgery;  Laterality: N/A;   TEE WITHOUT CARDIOVERSION N/A 06/30/2013   Procedure: TRANSESOPHAGEAL ECHOCARDIOGRAM (TEE) ;  Surgeon: [name], MD;  Location: [place];  Service: Cardiovascular;  Laterality: N/A;   TRANSURETHRAL RESECTION OF PROSTATE   2010    There were no vitals filed for this visit.   Subjective Assessment - 08/25/20 1022     Subjective Have been doing good. Swelling has reduced in both ankles.    Pertinent History prostate CA with resection 2010, HTN, GERD, HLD, CKD, chronic systolic/diastolic HF, CHF, CAD, a-fib, anxiety, L medial partial knee arthroplasty 1990's    Diagnostic tests 06/19/20 R knee MRI: Radial tear involving the posterior horn of the medial meniscus, probably remote large osteochondral lesion of medial femoral condyle    Patient Stated Goals decrease pain and increase strength    Currently in Pain? Yes    Pain Score 4     Pain Location Knee    Pain Orientation Right    Pain Descriptors / Indicators Aching;Dull    Pain Type Acute pain;Chronic pain                [facility] PT Assessment - 08/25/20 0001       6 Minute walk- Post Test   HR (bpm) 72    02 Sat (%RA) 96 %    Modified Borg Scale for Dyspnea 3- Moderate shortness of breath or breathing difficulty      6 minute walk test results    Aerobic Endurance Distance Walked 740    Endurance additional comments rest needed after 5 min                           [facility] Adult PT Treatment/Exercise - 08/25/20 0001       Knee/Hip Exercises: Aerobic   Nustep L5x63mn (UEs/LEs)                 Balance Exercises - 08/25/20 0001       Balance Exercises: Standing   Tandem Stance Eyes open;Upper extremity support 1;3 reps;15 secs    Tandem Gait Forward;Upper extremity support;3 reps   along the counter with UE suppor                PT Short Term Goals - 08/23/20 1103       PT SHORT TERM GOAL #1   Title Patient to be independent with initial HEP.    Status Achieved               PT Long Term Goals - 08/25/20 1053       PT LONG TERM GOAL #1   Title Patient to be independent with advanced HEP.    Time 6    Status Partially Met   met for current     PT LONG TERM GOAL #2   Title Patient to  demonstrate B LE strength >/=4+/5.    Status Achieved      PT LONG TERM GOAL #3   Title Patient to demonstrate R knee AROM/PROM 0-120 degrees.    Status Achieved      PT LONG TERM GOAL #4   Title Patient to score >/=48/56 on  Berg in order to decrease risk of falls.    Time 6    Status Partially Met   08/23/20: 46/56     PT LONG TERM GOAL #5   Title Patient to report 85% improvement in balance confidence with shower transfers.    Time 6    Status Partially Met   reports 40-50% improvement     PT LONG TERM GOAL #6   Title Patient to ambulate atleast 128f with 6 minute walk test with 4WW in order to improve functional activity tolerance and endurance.    Status On-going   ambulated 740 ft during 6 min walk test                  Plan - 08/25/20 1103     Clinical Impression Statement Pt demonstrated a good performance of 6 min walk test with increased distance and time before requiring rest. Vitals presented WNL post test. He had mod SOB afterwards showing improvement since last time this was tested. He required much rest afterwards so deferred further strengthening exercises. He demonstrated difficulty and instruction to maintain stability with his R UE during tandem exercises. He attempted tandem stance w/ intermittent UE support but was unable to maintain balance for longer than 3 seconds. Gave him cues to keep UE support to challenge balance but also maintain stability. Also encouraged him to keep UE support when trying balance exercises at home. Pt had a good response and is making progress toward goals.    Personal Factors and Comorbidities Age;Comorbidity 3+;Fitness;Past/Current Experience;Time since onset of injury/illness/exacerbation    Comorbidities prostate CA with resection 2010, HTN, GERD, HLD, CKD, chronic systolic/diastolic HF, CHF, CAD, a-fib, anxiety, L medial partial knee arthroplasty 1990's    PT Frequency 2x / week    PT Duration 6 weeks    PT  Treatment/Interventions ADLs/Self Care Home Management;Cryotherapy;Electrical Stimulation;Iontophoresis 459mml Dexamethasone;Moist Heat;Balance training;Therapeutic exercise;Therapeutic activities;Functional mobility training;Stair training;Gait training;Ultrasound;Neuromuscular re-education;Patient/family education;Manual techniques;Vestibular;Vasopneumatic Device;Taping;Energy conservation;Dry needling;Passive range of motion    PT Next Visit Plan progress LE strengthening as tolerated; Balance training working on SLS and narrow BOS    Consulted and Agree with Plan of Care Patient             Patient will benefit from skilled therapeutic intervention in order to improve the following deficits and impairments:  Abnormal gait, Decreased endurance, Increased edema, Pain, Increased fascial restricitons, Decreased strength, Decreased activity tolerance, Decreased balance, Difficulty walking, Improper body mechanics, Decreased range of motion, Impaired flexibility, Postural dysfunction  Visit Diagnosis: Chronic pain of right knee  Muscle weakness (generalized)  Unsteadiness on feet  Other symptoms and signs involving the musculoskeletal system  Other abnormalities of gait and mobility     Problem List Patient Active Problem List   Diagnosis Date Noted   Pain of knee joint with osteochondral injury 07/21/2020   Carcinoma of prostate ([facility]05/05/2020   Congestive heart failure ([facility]05/05/2020   Other psoriasis 06/30/2020   Other specified counseling 06/30/2020   Subchondral bone cyst 06/09/2020   OA (osteoarthritis) of knee 01/08/2020   Atypical atrial flutter (HCC)    Incidental pulmonary nodule 06/22/2014   Chronic kidney disease    S/P Maze operation for atrial fibrillation 10/29/2013   Penetrating atherosclerotic ulcer of aorta ([facility]/26/2015   Chronic atrial fibrillation ([facility]/29/2015   Dyslipidemia 07/15/2013   CAD (coronary artery disease), native coronary artery  06/28/2013   Dyspnea on exertion 06/26/2013   History of prostate cancer 06/26/2013  Chronic combined systolic and diastolic heart failure ([place]) 06/26/2013   NICM (nonischemic cardiomyopathy) with EF 20-25% 06/26/2013    Class: Diagnosis of   Atrial Fibrillation  06/25/2013   HTN (hypertension) 06/25/2013    Artist Pais, PTA 08/25/2020, 11:57 AM  [facility] [address]  [place], [place], [address] Phone: [phone]   Fax:  [phone]  Name: Tymeer Vaquera MRN: [id number] Date of Birth: November 30, 1940

## 2020-08-27 ENCOUNTER — Other Ambulatory Visit: Payer: Self-pay | Admitting: Cardiology

## 2020-08-27 DIAGNOSIS — I480 Paroxysmal atrial fibrillation: Secondary | ICD-10-CM

## 2020-08-31 ENCOUNTER — Other Ambulatory Visit: Payer: Self-pay

## 2020-08-31 ENCOUNTER — Ambulatory Visit: Payer: Medicare Other | Attending: Family Medicine

## 2020-08-31 ENCOUNTER — Telehealth: Payer: Self-pay | Admitting: Cardiology

## 2020-08-31 ENCOUNTER — Other Ambulatory Visit: Payer: Self-pay | Admitting: Cardiology

## 2020-08-31 DIAGNOSIS — R2681 Unsteadiness on feet: Secondary | ICD-10-CM | POA: Insufficient documentation

## 2020-08-31 DIAGNOSIS — R2689 Other abnormalities of gait and mobility: Secondary | ICD-10-CM | POA: Diagnosis not present

## 2020-08-31 DIAGNOSIS — I480 Paroxysmal atrial fibrillation: Secondary | ICD-10-CM

## 2020-08-31 DIAGNOSIS — M6281 Muscle weakness (generalized): Secondary | ICD-10-CM | POA: Diagnosis not present

## 2020-08-31 DIAGNOSIS — R29898 Other symptoms and signs involving the musculoskeletal system: Secondary | ICD-10-CM | POA: Diagnosis not present

## 2020-08-31 DIAGNOSIS — M25561 Pain in right knee: Secondary | ICD-10-CM | POA: Insufficient documentation

## 2020-08-31 DIAGNOSIS — G8929 Other chronic pain: Secondary | ICD-10-CM | POA: Diagnosis not present

## 2020-08-31 MED ORDER — AMIODARONE HCL 200 MG PO TABS: ORAL_TABLET | ORAL | 0 refills | Status: DC

## 2020-08-31 NOTE — Telephone Encounter (Signed)
*  STAT* If patient is at the pharmacy, call can be transferred to refill team.   1. Which medications need to be refilled? (please list name of each medication and dose if known) amiodarone (PACERONE) 200 MG tablet  2. Which pharmacy/location (including street and city if local pharmacy) is medication to be sent to?   3. Do they need a 30 day or 90 day supply? [place]

## 2020-08-31 NOTE — Telephone Encounter (Signed)
Patient is overdue for an appointment- sent in 15 tablets, as 30 tablets were sent in previously notifying patient that he needed an appointment. Notified again that they needed an appointment for refills.
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
## 2020-09-01 ENCOUNTER — Encounter: Payer: Self-pay | Admitting: Family Medicine

## 2020-09-01 ENCOUNTER — Ambulatory Visit (INDEPENDENT_AMBULATORY_CARE_PROVIDER_SITE_OTHER): Payer: Medicare Other | Admitting: Family Medicine

## 2020-09-01 DIAGNOSIS — M1711 Unilateral primary osteoarthritis, right knee: Secondary | ICD-10-CM | POA: Diagnosis not present

## 2020-09-01 DIAGNOSIS — M25569 Pain in unspecified knee: Secondary | ICD-10-CM | POA: Diagnosis not present

## 2020-09-01 NOTE — Progress Notes (Signed)
Jose Snyder - 80 y.o. male MRN [id number]  Date of birth: May 19, 1940  SUBJECTIVE:  Including CC & ROS.  No chief complaint on file.   Jose Snyder is a 80 y.o. male that is  following up for his right knee pain. His pain is minor. He is doing well with physical therapy. Had an exacerbated day of pain when he was more active.   Review of Systems See HPI   HISTORY: Past Medical, Surgical, Social, and Family History Reviewed & Updated per EMR.   Pertinent Historical Findings include:  Past Medical History:  Diagnosis Date   Anxiety    Arthritis    R hand- OA   Atrial Fibrillation  06/25/2013   CAD (coronary artery disease), native coronary artery 06/28/2013   a. LHC (06/27/13):  Mid LAD 40%, mid D1 75%, mid CFX 50-60%, mid RCA 50%, EF 20%, LVEDP 24 mmHg.   CHF (congestive heart failure) (HCC)    Chronic combined systolic and diastolic heart failure ([place]) 06/26/2013   a.  Echo (06/25/13):  EF 20-25%, diff HK, restrictive physio, mild MR, mod to severe LAE, mild RVE, mildly reduced RVSF, mod to severe RAE   Chronic kidney disease    Dyslipidemia 07/15/2013   Dysrhythmia    a-fib   GERD (gastroesophageal reflux disease)    Hypertension    Incidental pulmonary nodule 06/22/2014   Seen on CT angiogram of chest   NICM (nonischemic cardiomyopathy) with EF 20-25% 06/26/2013   Obesity    Penetrating atherosclerotic ulcer of aorta ([place]) 10/22/2013   Small penetrating ulcer of descending thoracic aorta discovered on routine CTA   Prostate cancer ([place])    watchful waiting, no treatment so far   S/P Maze operation for atrial fibrillation 10/29/2013   Complete bilateral atrial lesion set using cryothermy and bipolar radiofrequency ablation with clipping of LA appendage via right mini thoracotomy   Shortness of breath    seen by Stanford Breed, & low energy     Past Surgical History:  Procedure Laterality Date   CARDIAC CATHETERIZATION  06/2013   CARDIOVERSION N/A 06/30/2013   Procedure: CARDIOVERSION;   Surgeon: [name], MD;  Location: [place];  Service: Cardiovascular;  Laterality: N/A;   CARDIOVERSION N/A 07/31/2013   Procedure: CARDIOVERSION;  Surgeon: Thayer Headings, MD;  Location: [place];  Service: Cardiovascular;  Laterality: N/A;   CARDIOVERSION N/A 03/19/2018   Procedure: CARDIOVERSION;  Surgeon: [name], MD;  Location: [place];  Service: Cardiovascular;  Laterality: N/A;   CARDIOVERSION N/A 07/24/2018   Procedure: CARDIOVERSION;  Surgeon: [name], MD;  Location: [facility] ENDOSCOPY;  Service: Cardiovascular;  Laterality: N/A;   CLIPPING OF ATRIAL APPENDAGE  10/29/2013   Procedure: CLIPPING OF ATRIAL APPENDAGE;  Surgeon: Rexene Alberts, MD;  Location: [place];  Service: Open Heart Surgery;;   INTRAOPERATIVE TRANSESOPHAGEAL ECHOCARDIOGRAM N/A 10/29/2013   Procedure: INTRAOPERATIVE TRANSESOPHAGEAL ECHOCARDIOGRAM;  Surgeon: Rexene Alberts, MD;  Location: [place];  Service: Open Heart Surgery;  Laterality: N/A;   LEFT AND RIGHT HEART CATHETERIZATION WITH CORONARY ANGIOGRAM N/A 06/27/2013   Procedure: LEFT AND RIGHT HEART CATHETERIZATION WITH CORONARY ANGIOGRAM;  Surgeon: Jettie Booze, MD;  Location: [facility] CATH LAB;  Service: Cardiovascular;  Laterality: N/A;   MEDIAL PARTIAL KNEE REPLACEMENT Left 1990's?   MINIMALLY INVASIVE MAZE PROCEDURE N/A 10/29/2013   Procedure: MINIMALLY INVASIVE MAZE PROCEDURE;  Surgeon: Rexene Alberts, MD;  Location: [place];  Service: Open Heart Surgery;  Laterality: N/A;   TEE  WITHOUT CARDIOVERSION N/A 06/30/2013   Procedure: TRANSESOPHAGEAL ECHOCARDIOGRAM (TEE) ;  Surgeon: [name], MD;  Location: [facility] ENDOSCOPY;  Service: Cardiovascular;  Laterality: N/A;   TRANSURETHRAL RESECTION OF PROSTATE  2010    Family History  Problem Relation Age of Onset   Healthy Father    Healthy Mother    Healthy Sister     Social History   Socioeconomic History   Marital status: Widowed    Spouse name: Not on file   Number of children: Not on file    Years of education: Not on file   Highest education level: Not on file  Occupational History   Not on file  Tobacco Use   Smoking status: Never   Smokeless tobacco: Never   Tobacco comments:    06/25/2013 "smoked very light; years and years ago"  Vaping Use   Vaping Use: Never used  Substance and Sexual Activity   Alcohol use: Yes    Alcohol/week: 14.0 standard drinks    Types: 7 Glasses of wine, 7 Shots of liquor per week   Drug use: No   Sexual activity: Not Currently  Other Topics Concern   Not on file  Social History Narrative   [place] lives in [place]. He is widowed. Wife passed of breast cancer last year. No children.    Social Determinants of Health   Financial Resource Strain: Not on file  Food Insecurity: Not on file  Transportation Needs: Not on file  Physical Activity: Not on file  Stress: Not on file  Social Connections: Not on file  Intimate Partner Violence: Not on file     PHYSICAL EXAM:  VS: BP 132/62 (BP Location: Left Arm, Patient Position: Sitting, Cuff Size: Large)   Ht 6\' 2"  (1.88 m)   Wt 260 lb (117.9 kg)   BMI 33.38 kg/m  Physical Exam Gen: NAD, alert, cooperative with exam, well-appearing      ASSESSMENT & PLAN:   OA (osteoarthritis) of knee Pain is improved with only medial joint space pain currently.  He did have an exacerbation of his pain when he was moving some of his equipment. -Counseled on home exercise therapy and supportive care. -Counseled on weaning off the [place]. -Could consider injection.  Pain of knee joint with osteochondral injury Improving. -Continue physical therapy.

## 2020-09-01 NOTE — Assessment & Plan Note (Signed)
Improving.  Continue physical therapy.

## 2020-09-01 NOTE — Assessment & Plan Note (Signed)
Pain is improved with only medial joint space pain currently.  He did have an exacerbation of his pain when he was moving some of his equipment. -Counseled on home exercise therapy and supportive care. -Counseled on weaning off the [place]. -Could consider injection.

## 2020-09-02 ENCOUNTER — Encounter: Payer: Self-pay | Admitting: Physical Therapy

## 2020-09-02 ENCOUNTER — Ambulatory Visit: Payer: Medicare Other | Admitting: Physical Therapy

## 2020-09-02 ENCOUNTER — Other Ambulatory Visit: Payer: Self-pay

## 2020-09-02 VITALS — BP 130/62 | HR 61

## 2020-09-02 DIAGNOSIS — R2689 Other abnormalities of gait and mobility: Secondary | ICD-10-CM | POA: Diagnosis not present

## 2020-09-02 DIAGNOSIS — M6281 Muscle weakness (generalized): Secondary | ICD-10-CM | POA: Diagnosis not present

## 2020-09-02 DIAGNOSIS — R2681 Unsteadiness on feet: Secondary | ICD-10-CM | POA: Diagnosis not present

## 2020-09-02 DIAGNOSIS — G8929 Other chronic pain: Secondary | ICD-10-CM | POA: Diagnosis not present

## 2020-09-02 DIAGNOSIS — R29898 Other symptoms and signs involving the musculoskeletal system: Secondary | ICD-10-CM

## 2020-09-02 DIAGNOSIS — M25561 Pain in right knee: Secondary | ICD-10-CM | POA: Diagnosis not present

## 2020-09-02 NOTE — Patient Instructions (Signed)

## 2020-09-02 NOTE — Therapy (Signed)
[facility] [place], [place], [address] Phone: [phone]   Fax:  [phone]  Physical Therapy Treatment  Patient Details  Name: Linda Biehn MRN: [id number] Date of Birth: [birth year] Referring Provider (PT): Clearance Coots, MD   Encounter Date: 09/02/2020   PT End of Session - 09/02/20 1057     Visit Number 18    Number of Visits 27    Date for PT Re-Evaluation 10/04/20    Authorization Type Medicare & BCBS    PT Start Time 1012    PT Stop Time 1054    PT Time Calculation (min) 42 min    Activity Tolerance Patient tolerated treatment well;Patient limited by pain    Behavior During Therapy [facility] for tasks assessed/performed             Past Medical History:  Diagnosis Date   Anxiety    Arthritis    R hand- OA   Atrial Fibrillation  06/25/2013   CAD (coronary artery disease), native coronary artery 06/28/2013   a. LHC (06/27/13):  Mid LAD 40%, mid D1 75%, mid CFX 50-60%, mid RCA 50%, EF 20%, LVEDP 24 mmHg.   CHF (congestive heart failure) (HCC)    Chronic combined systolic and diastolic heart failure ([place]) 06/26/2013   a.  Echo (06/25/13):  EF 20-25%, diff HK, restrictive physio, mild MR, mod to severe LAE, mild RVE, mildly reduced RVSF, mod to severe RAE   Chronic kidney disease    Dyslipidemia 07/15/2013   Dysrhythmia    a-fib   GERD (gastroesophageal reflux disease)    Hypertension    Incidental pulmonary nodule 06/22/2014   Seen on CT angiogram of chest   NICM (nonischemic cardiomyopathy) with EF 20-25% 06/26/2013   Obesity    Penetrating atherosclerotic ulcer of aorta ([place]) 10/22/2013   Small penetrating ulcer of descending thoracic aorta discovered on routine CTA   Prostate cancer ([place])    watchful waiting, no treatment so far   S/P Maze operation for atrial fibrillation 10/29/2013   Complete bilateral atrial lesion set using cryothermy and bipolar radiofrequency ablation with clipping of LA  appendage via right mini thoracotomy   Shortness of breath    seen by Stanford Breed, & low energy     Past Surgical History:  Procedure Laterality Date   CARDIAC CATHETERIZATION  06/2013   CARDIOVERSION N/A 06/30/2013   Procedure: CARDIOVERSION;  Surgeon: [name], MD;  Location: [place];  Service: Cardiovascular;  Laterality: N/A;   CARDIOVERSION N/A 07/31/2013   Procedure: CARDIOVERSION;  Surgeon: Thayer Headings, MD;  Location: [place];  Service: Cardiovascular;  Laterality: N/A;   CARDIOVERSION N/A 03/19/2018   Procedure: CARDIOVERSION;  Surgeon: [name], MD;  Location: [place];  Service: Cardiovascular;  Laterality: N/A;   CARDIOVERSION N/A 07/24/2018   Procedure: CARDIOVERSION;  Surgeon: [name], MD;  Location: [facility] ENDOSCOPY;  Service: Cardiovascular;  Laterality: N/A;   CLIPPING OF ATRIAL APPENDAGE  10/29/2013   Procedure: CLIPPING OF ATRIAL APPENDAGE;  Surgeon: Rexene Alberts, MD;  Location: [place];  Service: Open Heart Surgery;;   INTRAOPERATIVE TRANSESOPHAGEAL ECHOCARDIOGRAM N/A 10/29/2013   Procedure: INTRAOPERATIVE TRANSESOPHAGEAL ECHOCARDIOGRAM;  Surgeon: Rexene Alberts, MD;  Location: [place];  Service: Open Heart Surgery;  Laterality: N/A;   LEFT AND RIGHT HEART CATHETERIZATION WITH CORONARY ANGIOGRAM N/A 06/27/2013   Procedure: LEFT AND RIGHT HEART CATHETERIZATION WITH CORONARY ANGIOGRAM;  Surgeon: Jettie Booze, MD;  Location:  [place] CATH LAB;  Service: Cardiovascular;  Laterality: N/A;   MEDIAL PARTIAL KNEE REPLACEMENT Left 1990's?   MINIMALLY INVASIVE MAZE PROCEDURE N/A 10/29/2013   Procedure: MINIMALLY INVASIVE MAZE PROCEDURE;  Surgeon: Rexene Alberts, MD;  Location: [place];  Service: Open Heart Surgery;  Laterality: N/A;   TEE WITHOUT CARDIOVERSION N/A 06/30/2013   Procedure: TRANSESOPHAGEAL ECHOCARDIOGRAM (TEE) ;  Surgeon: [name], MD;  Location: [place];  Service: Cardiovascular;  Laterality: N/A;   TRANSURETHRAL RESECTION OF PROSTATE  2010     Vitals:   09/02/20 1014  BP: 130/62  Pulse: 61  SpO2: 95%     Subjective Assessment - 09/02/20 1019     Subjective Knee is hurting him today d/t having to do a lot of walking to appointments yesterday.    Pertinent History prostate CA with resection 2010, HTN, GERD, HLD, CKD, chronic systolic/diastolic HF, CHF, CAD, a-fib, anxiety, L medial partial knee arthroplasty 1990's    Diagnostic tests 06/19/20 R knee MRI: Radial tear involving the posterior horn of the medial meniscus, probably remote large osteochondral lesion of medial femoral condyle    Patient Stated Goals decrease pain and increase strength    Currently in Pain? Yes    Pain Score 6     Pain Location Knee    Pain Orientation Right    Pain Descriptors / Indicators Aching;Dull    Pain Type Acute pain;Chronic pain                               [facility] Adult PT Treatment/Exercise - 09/02/20 0001       Knee/Hip Exercises: Stretches   Passive Hamstring Stretch Right;Left;1 rep;30 seconds    Passive Hamstring Stretch Limitations supine with strap    Hip Flexor Stretch Right;2 reps;30 seconds;Left    Hip Flexor Stretch Limitations mod thomas position with strap      Knee/Hip Exercises: Aerobic   Nustep L5x69mn (UEs/LEs)      Knee/Hip Exercises: Seated   Other Seated Knee/Hip Exercises isometric ab set with beach ball 10x5"   cues for rhythmic breathing     Knee/Hip Exercises: Supine   Bridges Strengthening;Both;1 set;10 reps    Bridges Limitations straight leg bridge    Straight Leg Raises Strengthening;Right;2 sets;5 reps    Straight Leg Raises Limitations 5x 0#, 5x 2#   no quad lag   Straight Leg Raise with External Rotation Strengthening;Right;1 set;10 reps    Straight Leg Raise with External Rotation Limitations 2#    Other Supine Knee/Hip Exercises alt bent knee fallouts 2x10 with green TB      Modalities   Modalities Iontophoresis      Iontophoresis   Type of Iontophoresis  Dexamethasone    Location R medial knee    Dose 1.041m 8089minutes    Time 4 hour patch                    PT Education - 09/02/20 1055     Education Details edu on ionto benefits, wear time, removal, precautions    Person(s) Educated Patient    Methods Explanation;Demonstration;Tactile cues;Verbal cues;Handout    Comprehension Verbalized understanding;Returned demonstration              PT Short Term Goals - 08/23/20 1103       PT SHORT TERM GOAL #1   Title Patient to be independent with initial HEP.    Status Achieved  PT Long Term Goals - 08/25/20 1053       PT LONG TERM GOAL #1   Title Patient to be independent with advanced HEP.    Time 6    Status Partially Met   met for current     PT LONG TERM GOAL #2   Title Patient to demonstrate B LE strength >/=4+/5.    Status Achieved      PT LONG TERM GOAL #3   Title Patient to demonstrate R knee AROM/PROM 0-120 degrees.    Status Achieved      PT LONG TERM GOAL #4   Title Patient to score >/=48/56 on Berg in order to decrease risk of falls.    Time 6    Status Partially Met   08/23/20: 46/56     PT LONG TERM GOAL #5   Title Patient to report 85% improvement in balance confidence with shower transfers.    Time 6    Status Partially Met   reports 40-50% improvement     PT LONG TERM GOAL #6   Title Patient to ambulate atleast 1258f with 6 minute walk test with 4WW in order to improve functional activity tolerance and endurance.    Status On-going   ambulated 740 ft during 6 min walk test                  Plan - 09/02/20 1057     Clinical Impression Statement Patient arrived to session with report of increased R knee pain today d/t having to do a lot of walking to appointments yesterday. Patient performed gentle LE stretching with good observed improvement in hip flexor and quad flexibility. Good quad stability was demonstrated with SLR's today. Increased challenge with  glute strengthening today with straight leg bridges, however patient demonstrated improved ability to maintain rhythmic breathing pattern throughout. Patient was educated on ionto benefits, wear time, removal, precautions and reported understanding and consent to trying this treatment today. Applied ionto patch to R medial knee at end of session. Patient without complaints upon leaving.    Personal Factors and Comorbidities Age;Comorbidity 3+;Fitness;Past/Current Experience;Time since onset of injury/illness/exacerbation    Comorbidities prostate CA with resection 2010, HTN, GERD, HLD, CKD, chronic systolic/diastolic HF, CHF, CAD, a-fib, anxiety, L medial partial knee arthroplasty 1990's    PT Frequency 2x / week    PT Duration 6 weeks    PT Treatment/Interventions ADLs/Self Care Home Management;Cryotherapy;Electrical Stimulation;Iontophoresis 480mml Dexamethasone;Moist Heat;Balance training;Therapeutic exercise;Therapeutic activities;Functional mobility training;Stair training;Gait training;Ultrasound;Neuromuscular re-education;Patient/family education;Manual techniques;Vestibular;Vasopneumatic Device;Taping;Energy conservation;Dry needling;Passive range of motion    PT Next Visit Plan progress LE strengthening as tolerated; Balance training working on SLS and narrow BOS    Consulted and Agree with Plan of Care Patient             Patient will benefit from skilled therapeutic intervention in order to improve the following deficits and impairments:  Abnormal gait, Decreased endurance, Increased edema, Pain, Increased fascial restricitons, Decreased strength, Decreased activity tolerance, Decreased balance, Difficulty walking, Improper body mechanics, Decreased range of motion, Impaired flexibility, Postural dysfunction  Visit Diagnosis: Chronic pain of right knee  Muscle weakness (generalized)  Unsteadiness on feet  Other symptoms and signs involving the musculoskeletal system  Other  abnormalities of gait and mobility     Problem List Patient Active Problem List   Diagnosis Date Noted   Pain of knee joint with osteochondral injury 07/21/2020   [place]/05/[place]   Congestive heart failure ([facility] [place]05/05/2020  Other psoriasis 06/30/2020   Other specified counseling 06/30/2020   Subchondral bone cyst 06/09/2020   OA (osteoarthritis) of knee 01/08/2020   Atypical atrial flutter (HCC)    Incidental pulmonary nodule 06/22/2014   Chronic kidney disease    S/P Maze operation for atrial fibrillation 10/29/2013   Penetrating atherosclerotic ulcer of aorta ([place]) 10/22/2013   Chronic atrial fibrillation ([place]) 09/24/2013   Dyslipidemia 07/15/2013   CAD (coronary artery disease), native coronary artery 06/28/2013   Dyspnea on exertion 06/26/2013   History of prostate cancer 06/26/2013   Chronic combined systolic and diastolic heart failure ([place]) 06/26/2013   NICM (nonischemic cardiomyopathy) with EF 20-25% 06/26/2013    Class: Diagnosis of   Atrial Fibrillation  06/25/2013   HTN (hypertension) 06/25/2013     Janene Harvey, PT, DPT 09/02/20 10:59 AM    [facility] [address]  [place], [place], [address] Phone: [phone]   Fax:  [phone]  Name: Raylon Lamson MRN: [id number] Date of Birth: 1940-07-29

## 2020-09-06 ENCOUNTER — Other Ambulatory Visit: Payer: Self-pay

## 2020-09-06 ENCOUNTER — Ambulatory Visit: Payer: Medicare Other | Admitting: Rehabilitative and Restorative Service Providers"

## 2020-09-06 ENCOUNTER — Encounter: Payer: Self-pay | Admitting: Rehabilitative and Restorative Service Providers"

## 2020-09-06 DIAGNOSIS — G8929 Other chronic pain: Secondary | ICD-10-CM | POA: Diagnosis not present

## 2020-09-06 DIAGNOSIS — R2689 Other abnormalities of gait and mobility: Secondary | ICD-10-CM

## 2020-09-06 DIAGNOSIS — M6281 Muscle weakness (generalized): Secondary | ICD-10-CM

## 2020-09-06 DIAGNOSIS — R2681 Unsteadiness on feet: Secondary | ICD-10-CM

## 2020-09-06 DIAGNOSIS — R29898 Other symptoms and signs involving the musculoskeletal system: Secondary | ICD-10-CM | POA: Diagnosis not present

## 2020-09-06 DIAGNOSIS — M25561 Pain in right knee: Secondary | ICD-10-CM

## 2020-09-06 NOTE — Therapy (Signed)
[facility] [place], [place], [address] Phone: [phone]   Fax:  [phone]  Physical Therapy Treatment  Patient Details  Name: Jose Snyder MRN: [id number] Date of Birth: [birth year] Referring Provider (PT): Clearance Coots, MD   Encounter Date: 09/06/2020   PT End of Session - 09/06/20 1021     Visit Number 19    Number of Visits 27    Date for PT Re-Evaluation 10/04/20    Authorization Type Medicare & BCBS    PT Start Time 3212    PT Stop Time 1058    PT Time Calculation (min) 43 min    Activity Tolerance Patient tolerated treatment well    Behavior During Therapy [facility] for tasks assessed/performed             Past Medical History:  Diagnosis Date   Anxiety    Arthritis    R hand- OA   Atrial Fibrillation  06/25/2013   CAD (coronary artery disease), native coronary artery 06/28/2013   a. LHC (06/27/13):  Mid LAD 40%, mid D1 75%, mid CFX 50-60%, mid RCA 50%, EF 20%, LVEDP 24 mmHg.   CHF (congestive heart failure) (HCC)    Chronic combined systolic and diastolic heart failure ([place]) 06/26/2013   a.  Echo (06/25/13):  EF 20-25%, diff HK, restrictive physio, mild Jose, mod to severe LAE, mild RVE, mildly reduced RVSF, mod to severe RAE   Chronic kidney disease    Dyslipidemia 07/15/2013   Dysrhythmia    a-fib   GERD (gastroesophageal reflux disease)    Hypertension    Incidental pulmonary nodule 06/22/2014   Seen on CT angiogram of chest   NICM (nonischemic cardiomyopathy) with EF 20-25% 06/26/2013   Obesity    Penetrating atherosclerotic ulcer of aorta ([place]) 10/22/2013   Small penetrating ulcer of descending thoracic aorta discovered on routine CTA   Prostate cancer ([place])    watchful waiting, no treatment so far   S/P Maze operation for atrial fibrillation 10/29/2013   Complete bilateral atrial lesion set using cryothermy and bipolar radiofrequency ablation with clipping of LA appendage via right mini  thoracotomy   Shortness of breath    seen by Stanford Breed, & low energy     Past Surgical History:  Procedure Laterality Date   CARDIAC CATHETERIZATION  06/2013   CARDIOVERSION N/A 06/30/2013   Procedure: CARDIOVERSION;  Surgeon: [name], MD;  Location: [place];  Service: Cardiovascular;  Laterality: N/A;   CARDIOVERSION N/A 07/31/2013   Procedure: CARDIOVERSION;  Surgeon: Thayer Headings, MD;  Location: [place];  Service: Cardiovascular;  Laterality: N/A;   CARDIOVERSION N/A 03/19/2018   Procedure: CARDIOVERSION;  Surgeon: [name], MD;  Location: [place];  Service: Cardiovascular;  Laterality: N/A;   CARDIOVERSION N/A 07/24/2018   Procedure: CARDIOVERSION;  Surgeon: [name], MD;  Location: [facility] ENDOSCOPY;  Service: Cardiovascular;  Laterality: N/A;   CLIPPING OF ATRIAL APPENDAGE  10/29/2013   Procedure: CLIPPING OF ATRIAL APPENDAGE;  Surgeon: Rexene Alberts, MD;  Location: [place];  Service: Open Heart Surgery;;   INTRAOPERATIVE TRANSESOPHAGEAL ECHOCARDIOGRAM N/A 10/29/2013   Procedure: INTRAOPERATIVE TRANSESOPHAGEAL ECHOCARDIOGRAM;  Surgeon: Rexene Alberts, MD;  Location: [place];  Service: Open Heart Surgery;  Laterality: N/A;   LEFT AND RIGHT HEART CATHETERIZATION WITH CORONARY ANGIOGRAM N/A 06/27/2013   Procedure: LEFT AND RIGHT HEART CATHETERIZATION WITH CORONARY ANGIOGRAM;  Surgeon: Jettie Booze, MD;  Location: [facility] CATH LAB;  Service: Cardiovascular;  Laterality: N/A;   MEDIAL PARTIAL KNEE REPLACEMENT Left 1990's?   MINIMALLY INVASIVE MAZE PROCEDURE N/A 10/29/2013   Procedure: MINIMALLY INVASIVE MAZE PROCEDURE;  Surgeon: Rexene Alberts, MD;  Location: [place];  Service: Open Heart Surgery;  Laterality: N/A;   TEE WITHOUT CARDIOVERSION N/A 06/30/2013   Procedure: TRANSESOPHAGEAL ECHOCARDIOGRAM (TEE) ;  Surgeon: [name], MD;  Location: [place];  Service: Cardiovascular;  Laterality: N/A;   TRANSURETHRAL RESECTION OF PROSTATE  2010    There were no  vitals filed for this visit.   Subjective Assessment - 09/06/20 1019     Subjective That patch on my knee helped a lot until about Saturday.  I was able to go out to eat Friday night with very little pain.    Patient Stated Goals decrease pain and increase strength    Currently in Pain? Yes    Pain Score 5     Pain Location Knee    Pain Orientation Right    Pain Descriptors / Indicators Aching;Dull                               [facility] Adult PT Treatment/Exercise - 09/06/20 0001       Knee/Hip Exercises: Aerobic   Nustep L5x33min (UEs/LEs)      Knee/Hip Exercises: Machines for Strengthening   Cybex Knee Extension 15# 2x10 reps B LE    Cybex Knee Flexion 25# 2x10 reps B LE      Knee/Hip Exercises: Standing   Functional Squat 15 reps      Knee/Hip Exercises: Seated   Sit to Sand 10 reps;without UE support   from mat     Knee/Hip Exercises: Supine   Bridges Strengthening;Both;1 set;10 reps    Bridges with [employer] Strengthening;1 set;10 reps    Straight Leg Raises Strengthening;Both;2 sets;10 reps    Straight Leg Raises Limitations 5#      Modalities   Modalities Iontophoresis      Iontophoresis   Type of Iontophoresis Dexamethasone    Location R medial knee    Dose 1.46ml, 31mA*minutes    Time 4 hour patch                      PT Short Term Goals - 08/23/20 1103       PT SHORT TERM GOAL #1   Title Patient to be independent with initial HEP.    Status Achieved               PT Long Term Goals - 08/25/20 1053       PT LONG TERM GOAL #1   Title Patient to be independent with advanced HEP.    Time 6    Status Partially Met   met for current     PT LONG TERM GOAL #2   Title Patient to demonstrate B LE strength >/=4+/5.    Status Achieved      PT LONG TERM GOAL #3   Title Patient to demonstrate R knee AROM/PROM 0-120 degrees.    Status Achieved      PT LONG TERM GOAL #4   Title Patient to score >/=48/56 on Berg in  order to decrease risk of falls.    Time 6    Status Partially Met   08/23/20: 46/56     PT LONG TERM GOAL #5   Title Patient to report 85% improvement in  balance confidence with shower transfers.    Time 6    Status Partially Met   reports 40-50% improvement     PT LONG TERM GOAL #6   Title Patient to ambulate atleast 1229ft with 6 minute walk test with 4WW in order to improve functional activity tolerance and endurance.    Status On-going   ambulated 740 ft during 6 min walk test                  Plan - 09/06/20 1058     Clinical Impression Statement Jose Snyder reports feeling better today and states that his ionto patch worked well and helped relieve his pain for at least a day.  He was able to tolerate strengthening exercises well today, but did require some therapeutic recovery periods during session.  Pt requires cuing for pursed lip throughout session secondary to dyspnea.  He continues to progress towards goal related activities and asked for ionto patch to be reapplied due to the experienced pain relief last session.    PT Treatment/Interventions ADLs/Self Care Home Management;Cryotherapy;Electrical Stimulation;Iontophoresis 4mg /ml Dexamethasone;Moist Heat;Balance training;Therapeutic exercise;Therapeutic activities;Functional mobility training;Stair training;Gait training;Ultrasound;Neuromuscular re-education;Patient/family education;Manual techniques;Vestibular;Vasopneumatic Device;Taping;Energy conservation;Dry needling;Passive range of motion    PT Next Visit Plan progress LE strengthening as tolerated; Balance training working on SLS and narrow BOS    Consulted and Agree with Plan of Care Patient             Patient will benefit from skilled therapeutic intervention in order to improve the following deficits and impairments:  Abnormal gait, Decreased endurance, Increased edema, Pain, Increased fascial restricitons, Decreased strength, Decreased activity tolerance,  Decreased balance, Difficulty walking, Improper body mechanics, Decreased range of motion, Impaired flexibility, Postural dysfunction  Visit Diagnosis: Chronic pain of right knee  Muscle weakness (generalized)  Unsteadiness on feet  Other symptoms and signs involving the musculoskeletal system  Other abnormalities of gait and mobility     Problem List Patient Active Problem List   Diagnosis Date Noted   Pain of knee joint with osteochondral injury 07/21/2020   Carcinoma of prostate ([place]) 06/30/2020   Congestive heart failure ([place]) 06/30/2020   Other psoriasis 06/30/2020   Other specified counseling 06/30/2020   Subchondral bone cyst 06/09/2020   OA (osteoarthritis) of knee 01/08/2020   Atypical atrial flutter (HCC)    Incidental pulmonary nodule 06/22/2014   Chronic kidney disease    S/P Maze operation for atrial fibrillation 10/29/2013   Penetrating atherosclerotic ulcer of aorta ([place]) 10/22/2013   Chronic atrial fibrillation ([place]) 09/24/2013   Dyslipidemia 07/15/2013   CAD (coronary artery disease), native coronary artery 06/28/2013   Dyspnea on exertion 06/26/2013   History of prostate cancer 06/26/2013   Chronic combined systolic and diastolic heart failure ([place]) 06/26/2013   NICM (nonischemic cardiomyopathy) with EF 20-25% 06/26/2013    Class: Diagnosis of   Atrial Fibrillation  06/25/2013   HTN (hypertension) 06/25/2013    Juel Burrow, PT, DPT 09/06/2020, 11:02 AM  [facility] [address]  [place], [place], [address] Phone: [phone]   Fax:  [phone]  Name: Jose Snyder MRN: [id number] Date of Birth: [birth year]

## 2020-09-09 ENCOUNTER — Ambulatory Visit: Payer: Medicare Other

## 2020-09-09 ENCOUNTER — Other Ambulatory Visit: Payer: Self-pay

## 2020-09-09 DIAGNOSIS — M6281 Muscle weakness (generalized): Secondary | ICD-10-CM

## 2020-09-09 DIAGNOSIS — M25561 Pain in right knee: Secondary | ICD-10-CM | POA: Diagnosis not present

## 2020-09-09 DIAGNOSIS — R29898 Other symptoms and signs involving the musculoskeletal system: Secondary | ICD-10-CM | POA: Diagnosis not present

## 2020-09-09 DIAGNOSIS — G8929 Other chronic pain: Secondary | ICD-10-CM

## 2020-09-09 DIAGNOSIS — R2681 Unsteadiness on feet: Secondary | ICD-10-CM | POA: Diagnosis not present

## 2020-09-09 DIAGNOSIS — R2689 Other abnormalities of gait and mobility: Secondary | ICD-10-CM

## 2020-09-09 NOTE — Therapy (Signed)
[facility] [place], [place], [address] Phone: [phone]   Fax:  [phone]  Physical Therapy Treatment  Patient Details  Name: Jose Snyder MRN: [id number] Date of Birth: December 08, 1940 Referring Provider (PT): Clearance Coots, MD   Encounter Date: 09/09/2020   PT End of Session - 09/09/20 1051     Visit Number 20    Number of Visits 27    Date for PT Re-Evaluation 10/04/20    Authorization Type Medicare & BCBS    PT Start Time 1001    PT Stop Time 8185    PT Time Calculation (min) 46 min    Activity Tolerance Patient tolerated treatment well;Patient limited by pain    Behavior During Therapy [facility] for tasks assessed/performed             Past Medical History:  Diagnosis Date   Anxiety    Arthritis    R hand- OA   Atrial Fibrillation  06/25/2013   CAD (coronary artery disease), native coronary artery 06/28/2013   a. LHC (06/27/13):  Mid LAD 40%, mid D1 75%, mid CFX 50-60%, mid RCA 50%, EF 20%, LVEDP 24 mmHg.   CHF (congestive heart failure) (HCC)    Chronic combined systolic and diastolic heart failure ([place]) 06/26/2013   a.  Echo (06/25/13):  EF 20-25%, diff HK, restrictive physio, mild MR, mod to severe LAE, mild RVE, mildly reduced RVSF, mod to severe RAE   Chronic kidney disease    Dyslipidemia 07/15/2013   Dysrhythmia    a-fib   GERD (gastroesophageal reflux disease)    Hypertension    Incidental pulmonary nodule 06/22/2014   Seen on CT angiogram of chest   NICM (nonischemic cardiomyopathy) with EF 20-25% 06/26/2013   Obesity    Penetrating atherosclerotic ulcer of aorta ([place]) 10/22/2013   Small penetrating ulcer of descending thoracic aorta discovered on routine CTA   Prostate cancer ([place])    watchful waiting, no treatment so far   S/P Maze operation for atrial fibrillation 10/29/2013   Complete bilateral atrial lesion set using cryothermy and bipolar radiofrequency ablation with clipping of LA  appendage via right mini thoracotomy   Shortness of breath    seen by Stanford Breed, & low energy     Past Surgical History:  Procedure Laterality Date   CARDIAC CATHETERIZATION  06/2013   CARDIOVERSION N/A 06/30/2013   Procedure: CARDIOVERSION;  Surgeon: [name], MD;  Location: [facility];  Service: Cardiovascular;  Laterality: N/A;   CARDIOVERSION N/A 07/31/2013   Procedure: CARDIOVERSION;  Surgeon: Thayer Headings, MD;  Location: [facility];  Service: Cardiovascular;  Laterality: N/A;   CARDIOVERSION N/A 03/19/2018   Procedure: CARDIOVERSION;  Surgeon: [name], MD;  Location: [place];  Service: Cardiovascular;  Laterality: N/A;   CARDIOVERSION N/A 07/24/2018   Procedure: CARDIOVERSION;  Surgeon: [name], MD;  [facility] END[facility];  Service: Cardiovascular;  Laterality: N/A;   CLIPPING OF ATRIAL APPENDAGE  10/29/2013   Procedure: CLIPPING OF ATRIAL APPENDAGE;  Surgeon: Rexene Alberts, MD;  Location: [place];  Service: Open Heart Surgery;;   INTRAOPERATIVE TRANSESOPHAGEAL ECHOCARDIOGRAM N/A 10/29/2013   Procedure: INTRAOPERATIVE TRANSESOPHAGEAL ECHOCARDIOGRAM;  Surgeon: Rexene Alberts, MD;  Location: [place];  Service: Open Heart Surgery;  Laterality: N/A;   LEFT AND RIGHT HEART CATHETERIZATION WITH CORONARY ANGIOGRAM N/A 06/27/2013   Procedure: LEFT AND RIGHT HEART CATHETERIZATION WITH CORONARY ANGIOGRAM;  Surgeon: Jettie Booze, MD;  Location:  [place] CATH LAB;  Service: Cardiovascular;  Laterality: N/A;   MEDIAL PARTIAL KNEE REPLACEMENT Left 1990's?   MINIMALLY INVASIVE MAZE PROCEDURE N/A 10/29/2013   Procedure: MINIMALLY INVASIVE MAZE PROCEDURE;  Surgeon: Rexene Alberts, MD;  Location: [facility];  Service: Open Heart Surgery;  Laterality: N/A;   TEE WITHOUT CARDIOVERSION N/A 06/30/2013   Procedure: TRANSESOPHAGEAL ECHOCARDIOGRAM (TEE) ;  Surgeon: [name], MD;  Location: [place];  Service: Cardiovascular;  Laterality: N/A;   TRANSURETHRAL RESECTION OF PROSTATE  2010     There were no vitals filed for this visit.   Subjective Assessment - 09/09/20 1004     Subjective Notes a lot of relief from using ionto patches.    Pertinent History prostate CA with resection 2010, HTN, GERD, HLD, CKD, chronic systolic/diastolic HF, CHF, CAD, a-fib, anxiety, L medial partial knee arthroplasty 1990's    Diagnostic tests 06/19/20 R knee MRI: Radial tear involving the posterior horn of the medial meniscus, probably remote large osteochondral lesion of medial femoral condyle    Patient Stated Goals decrease pain and increase strength    Currently in Pain? Yes    Pain Score 5     Pain Location Knee    Pain Orientation Right    Pain Descriptors / Indicators Aching;Dull    Pain Type Chronic pain                               [facility] Adult PT Treatment/Exercise - 09/09/20 0001       Knee/Hip Exercises: Stretches   Passive Hamstring Stretch Right;30 seconds    Passive Hamstring Stretch Limitations manual stretch    Hip Flexor Stretch Right;2 reps;30 seconds;Left    Hip Flexor Stretch Limitations thomas position with strap      Knee/Hip Exercises: Aerobic   Recumbent Bike L2x56mn      Knee/Hip Exercises: Standing   Hip Flexion Stengthening;Both;10 reps;Knee bent    Hip Flexion Limitations 3#    Hip Abduction Stengthening;Both;10 reps;Knee straight    Abduction Limitations 3#      Modalities   Modalities Iontophoresis      Iontophoresis   Type of Iontophoresis Dexamethasone    Location R medial knee    Dose 1.075m 8023minutes    Time 4 hour patch      Manual Therapy   Manual Therapy Soft tissue mobilization    Soft tissue mobilization IASTM to calf, quads                      PT Short Term Goals - 08/23/20 1103       PT SHORT TERM GOAL #1   Title Patient to be independent with initial HEP.    Status Achieved               PT Long Term Goals - 08/25/20 1053       PT LONG TERM GOAL #1   Title Patient to be  independent with advanced HEP.    Time 6    Status Partially Met   met for current     PT LONG TERM GOAL #2   Title Patient to demonstrate B LE strength >/=4+/5.    Status Achieved      PT LONG TERM GOAL #3   Title Patient to demonstrate R knee AROM/PROM 0-120 degrees.    Status Achieved      PT LONG TERM GOAL #4  Title Patient to score >/=48/56 on Berg in order to decrease risk of falls.    Time 6    Status Partially Met   08/23/20: 46/56     PT LONG TERM GOAL #5   Title Patient to report 85% improvement in balance confidence with shower transfers.    Time 6    Status Partially Met   reports 40-50% improvement     PT LONG TERM GOAL #6   Title Patient to ambulate atleast 1273f with 6 minute walk test with 4WW in order to improve functional activity tolerance and endurance.    Status On-going   ambulated 740 ft during 6 min walk test                  Plan - 09/09/20 1054     Clinical Impression Statement Pt reported increased knee pain pre session. We tried to start with hip strengthening exercises but he kept having reports of R knee pain when standing. Address knee pain with IASTM. TTP was found in the proximal calf and distal quads. Pt noted much improvement from the manual work today. He also noted that the ionto patches relieve a lot of pain when he has it on. Most of the session was focused on relieving pain in the R knee but will continue to work on hip strengthening to improve stability with ADLs.    Personal Factors and Comorbidities Age;Comorbidity 3+;Fitness;Past/Current Experience;Time since onset of injury/illness/exacerbation    Comorbidities prostate CA with resection 2010, HTN, GERD, HLD, CKD, chronic systolic/diastolic HF, CHF, CAD, a-fib, anxiety, L medial partial knee arthroplasty 1990's    PT Frequency 2x / week    PT Duration 6 weeks    PT Treatment/Interventions ADLs/Self Care Home Management;Cryotherapy;Electrical Stimulation;Iontophoresis 487mml  Dexamethasone;Moist Heat;Balance training;Therapeutic exercise;Therapeutic activities;Functional mobility training;Stair training;Gait training;Ultrasound;Neuromuscular re-education;Patient/family education;Manual techniques;Vestibular;Vasopneumatic Device;Taping;Energy conservation;Dry needling;Passive range of motion    PT Next Visit Plan progress hip strengthening as tolerated; Balance training working on SLS and narrow BOS    Consulted and Agree with Plan of Care Patient             Patient will benefit from skilled therapeutic intervention in order to improve the following deficits and impairments:  Abnormal gait, Decreased endurance, Increased edema, Pain, Increased fascial restricitons, Decreased strength, Decreased activity tolerance, Decreased balance, Difficulty walking, Improper body mechanics, Decreased range of motion, Impaired flexibility, Postural dysfunction  Visit Diagnosis: Chronic pain of right knee  Muscle weakness (generalized)  Unsteadiness on feet  Other symptoms and signs involving the musculoskeletal system  Other abnormalities of gait and mobility     Problem List Patient Active Problem List   Diagnosis Date Noted   Pain of knee joint with osteochondral injury 07/21/2020   Carcinoma of prostate ([facility]05/05/2020   Congestive heart failure ([facility]05/05/2020   Other psoriasis 06/30/2020   Other specified counseling 06/30/2020   Subchondral bone cyst 06/09/2020   OA (osteoarthritis) of knee 01/08/2020   Atypical atrial flutter (HCC)    Incidental pulmonary nodule 06/22/2014   Chronic kidney disease    S/P Maze operation for atrial fibrillation 10/29/2013   Penetrating atherosclerotic ulcer of aorta ([facility] [place]08/26/2015   Chronic atrial fibrillation ([facility]07/29/2015   Dyslipidemia 07/15/2013   CAD (coronary artery disease), native coronary artery 06/28/2013   Dyspnea on exertion 06/26/2013   History of prostate cancer 06/26/2013   Chronic combined  systolic and diastolic heart failure ([facility]CG[facility]04/30/2015   NICM (nonischemic cardiomyopathy) with EF 20-25% 06/26/2013  Class: Diagnosis of   Atrial Fibrillation  06/25/2013   HTN (hypertension) 06/25/2013    Artist Pais, PTA 09/09/2020, 11:58 AM  [facility] [address]  [place], [place], [address] Phone: [phone]   Fax:  [phone]  Name: Jose Snyder MRN: [id number] Date of Birth: 1940-03-11

## 2020-09-13 ENCOUNTER — Encounter: Payer: Self-pay | Admitting: Physical Therapy

## 2020-09-13 ENCOUNTER — Other Ambulatory Visit: Payer: Self-pay

## 2020-09-13 ENCOUNTER — Ambulatory Visit: Payer: Medicare Other | Admitting: Physical Therapy

## 2020-09-13 ENCOUNTER — Telehealth: Payer: Self-pay | Admitting: Family Medicine

## 2020-09-13 ENCOUNTER — Other Ambulatory Visit: Payer: Self-pay | Admitting: Cardiology

## 2020-09-13 DIAGNOSIS — R2689 Other abnormalities of gait and mobility: Secondary | ICD-10-CM

## 2020-09-13 DIAGNOSIS — M6281 Muscle weakness (generalized): Secondary | ICD-10-CM

## 2020-09-13 DIAGNOSIS — G8929 Other chronic pain: Secondary | ICD-10-CM | POA: Diagnosis not present

## 2020-09-13 DIAGNOSIS — I480 Paroxysmal atrial fibrillation: Secondary | ICD-10-CM

## 2020-09-13 DIAGNOSIS — R29898 Other symptoms and signs involving the musculoskeletal system: Secondary | ICD-10-CM

## 2020-09-13 DIAGNOSIS — M25561 Pain in right knee: Secondary | ICD-10-CM | POA: Diagnosis not present

## 2020-09-13 DIAGNOSIS — R2681 Unsteadiness on feet: Secondary | ICD-10-CM

## 2020-09-13 MED ORDER — HYDROCODONE-ACETAMINOPHEN 5-325 MG PO TABS: 1.0000 | ORAL_TABLET | Freq: Three times a day (TID) | ORAL | 0 refills | Status: DC | PRN

## 2020-09-13 NOTE — Telephone Encounter (Signed)
Refilled norco.   Rosemarie Ax, MD [facility] 09/13/2020, 2:59 PM

## 2020-09-13 NOTE — Telephone Encounter (Signed)
Pt came by office to req Rx refill on :  HYDROcodone-acetaminophen (NORCO/VICODIN) 5-325 MG tablet [[id number]]    Order Details Dose: 1 tablet Route: Oral Frequency: Every 8 hours PRN  Dispense Quantity: 15 tablet Refills: 0        Sig: Take 1 tablet by mouth every 8 (eight) hours as needed.         --Patient uses :   [facility], [place] - [address]  [address] [place], [place] [address]  Phone:  [phone]  Fax:  [phone]

## 2020-09-13 NOTE — Therapy (Signed)
[facility] [address]  [place], [place], [address] Phone: [phone]   Fax:  [phone]  Physical Therapy Treatment  Patient Details  Name: Jose Snyder MRN: [id number] Date of Birth: 1940-12-07 Referring Provider (PT): Clearance Coots, MD   Encounter Date: 09/13/2020   PT End of Session - 09/13/20 1142     Visit Number 21    Number of Visits 27    Date for PT Re-Evaluation 10/04/20    Authorization Type Medicare & BCBS    PT Start Time 1059    PT Stop Time 1142    PT Time Calculation (min) 43 min    Equipment Utilized During Treatment Gait belt    Activity Tolerance Patient tolerated treatment well;Patient limited by pain;Patient limited by fatigue    Behavior During Therapy [facility] for tasks assessed/performed             Past Medical History:  Diagnosis Date   Anxiety    Arthritis    R hand- OA   Atrial Fibrillation  06/25/2013   CAD (coronary artery disease), native coronary artery 06/28/2013   a. LHC (06/27/13):  Mid LAD 40%, mid D1 75%, mid CFX 50-60%, mid RCA 50%, EF 20%, LVEDP 24 mmHg.   CHF (congestive heart failure) (HCC)    Chronic combined systolic and diastolic heart failure ([place]) 06/26/2013   a.  Echo (06/25/13):  EF 20-25%, diff HK, restrictive physio, mild MR, mod to severe LAE, mild RVE, mildly reduced RVSF, mod to severe RAE   Chronic kidney disease    Dyslipidemia 07/15/2013   Dysrhythmia    a-fib   GERD (gastroesophageal reflux disease)    Hypertension    Incidental pulmonary nodule 06/22/2014   Seen on CT angiogram of chest   NICM (nonischemic cardiomyopathy) with EF 20-25% 06/26/2013   Obesity    Penetrating atherosclerotic ulcer of aorta ([place]) 10/22/2013   Small penetrating ulcer of descending thoracic aorta discovered on routine CTA   Prostate cancer ([place])    watchful waiting, no treatment so far   S/P Maze operation for atrial fibrillation 10/29/2013   Complete bilateral atrial lesion  set using cryothermy and bipolar radiofrequency ablation with clipping of LA appendage via right mini thoracotomy   Shortness of breath    seen by Stanford Breed, & low energy     Past Surgical History:  Procedure Laterality Date   CARDIAC CATHETERIZATION  06/2013   CARDIOVERSION N/A 06/30/2013   Procedure: CARDIOVERSION;  Surgeon: [name], MD;  Location: [facility];  Service: Cardiovascular;  Laterality: N/A;   CARDIOVERSION N/A 07/31/2013   Procedure: CARDIOVERSION;  Surgeon: Thayer Headings, MD;  Location: [facility];  Service: Cardiovascular;  Laterality: N/A;   CARDIOVERSION N/A 03/19/2018   Procedure: CARDIOVERSION;  Surgeon: [name], MD;  Location: [place];  Service: Cardiovascular;  Laterality: N/A;   CARDIOVERSION N/A 07/24/2018   Procedure: CARDIOVERSION;  Surgeon: [name], MD;  [facility] ENDOSCOPY;  Service: Cardiovascular;  Laterality: N/A;   CLIPPING OF ATRIAL APPENDAGE  10/29/2013   Procedure: CLIPPING OF ATRIAL APPENDAGE;  Surgeon: Rexene Alberts, MD;  Location: [place];  Service: Open Heart Surgery;;   INTRAOPERATIVE TRANSESOPHAGEAL ECHOCARDIOGRAM N/A 10/29/2013   Procedure: INTRAOPERATIVE TRANSESOPHAGEAL ECHOCARDIOGRAM;  Surgeon: Rexene Alberts, MD;  Location: [place];  Service: Open Heart Surgery;  Laterality: N/A;   LEFT AND RIGHT HEART CATHETERIZATION WITH CORONARY ANGIOGRAM N/A 06/27/2013   Procedure: LEFT AND RIGHT HEART  CATHETERIZATION WITH CORONARY ANGIOGRAM;  Surgeon: Jettie Booze, MD;  Location[facility] CATH LAB;  Service: Cardiovascular;  Laterality: N/A;   MEDIAL PARTIAL KNEE REPLACEMENT Left 1990's?   MINIMALLY INVASIVE MAZE PROCEDURE N/A 10/29/2013   Procedure: MINIMALLY INVASIVE MAZE PROCEDURE;  Surgeon: Rexene Alberts, MD;  Location: [facility];  Service: Open Heart Surgery;  Laterality: N/A;   TEE WITHOUT CARDIOVERSION N/A 06/30/2013   Procedure: TRANSESOPHAGEAL ECHOCARDIOGRAM (TEE) ;  Surgeon: [name], MD;  Location: [facility];  Service:  Cardiovascular;  Laterality: N/A;   TRANSURETHRAL RESECTION OF PROSTATE  2010    There were no vitals filed for this visit.   Subjective Assessment - 09/13/20 1101     Subjective Reports that since he is not taking Oxycodone anymore, his pain levels have been a little higher. However, feels that the ionto patch helps a lot. Feeling fatigue.    Pertinent History prostate CA with resection 2010, HTN, GERD, HLD, CKD, chronic systolic/diastolic HF, CHF, CAD, a-fib, anxiety, L medial partial knee arthroplasty 1990's    Diagnostic tests 06/19/20 R knee MRI: Radial tear involving the posterior horn of the medial meniscus, probably remote large osteochondral lesion of medial femoral condyle    Patient Stated Goals decrease pain and increase strength    Currently in Pain? Yes    Pain Score 5     Pain Location Knee    Pain Orientation Right    Pain Descriptors / Indicators Aching;Dull    Pain Type Chronic pain                               [facility] Adult PT Treatment/Exercise - 09/13/20 1103       Knee/Hip Exercises: Aerobic   Nustep L5x72min (UEs/LEs)      Knee/Hip Exercises: Seated   Sit to Sand 10 reps;without UE support   blue medball at chest     Knee/Hip Exercises: Supine   Bridges with Clamshell Strengthening;2 sets;10 reps   green TB; cues to focus on rythmic breathing   Other Supine Knee/Hip Exercises alt deadbug with LEs only x20   cues to maintain neutral spine   Other Supine Knee/Hip Exercises alt bent knee fallouts x20 with green TB                 Balance Exercises - 09/13/20 0001       Balance Exercises: Standing   Tandem Gait 4 reps;Forward   couple fingers support at counter   Retro Gait 4 reps   couple fingers support at counter; cueing to widen BOS   Other Standing Exercises Comments grapevine at counter top 4x   trouble with coordination or movement and heavy anteiror trunk lean; consistent verbal and manual cueing                 PT Short Term Goals - 08/23/20 1103       PT SHORT TERM GOAL #1   Title Patient to be independent with initial HEP.    Status Achieved               PT Long Term Goals - 08/25/20 1053       PT LONG TERM GOAL #1   Title Patient to be independent with advanced HEP.    Time 6    Status Partially Met   met for current     PT LONG TERM GOAL #2   Title Patient to  demonstrate B LE strength >/=4+/5.    Status Achieved      PT LONG TERM GOAL #3   Title Patient to demonstrate R knee AROM/PROM 0-120 degrees.    Status Achieved      PT LONG TERM GOAL #4   Title Patient to score >/=48/56 on Berg in order to decrease risk of falls.    Time 6    Status Partially Met   08/23/20: 46/56     PT LONG TERM GOAL #5   Title Patient to report 85% improvement in balance confidence with shower transfers.    Time 6    Status Partially Met   reports 40-50% improvement     PT LONG TERM GOAL #6   Title Patient to ambulate atleast 1268ft with 6 minute walk test with 4WW in order to improve functional activity tolerance and endurance.    Status On-going   ambulated 740 ft during 6 min walk test                  Plan - 09/13/20 1143     Clinical Impression Statement Patient arrived to session with report of increased pain levels since being off of the Oxycodone. However, feels benefit from ionto patch. Patient demonstrated lacking anterior trunk lean with STS from warm up machine, thus proceeded to work on weighted STS transfers. Patient still required cues to increase anterior trunk lean and for proper positioning/set up. Worked on Astronomer with minor UE support on counter top. Patient required cueing to widen BOS with backwards walking, with good improvement in stability noted. Consistent verbal and manual cueing required for proper coordination of grapevine exercise today. Intermittent sitting rest breaks were taken between standing exercises d/t fatigue. Patient reported  increased R knee pain after balance activities, thus ended session with LE strengthening ther-ex on mat. Patient demonstrated good carryover and form with these activities. No complaints at end of session. Patient is progressing towards goals despite continued knee pain.    Personal Factors and Comorbidities Age;Comorbidity 3+;Fitness;Past/Current Experience;Time since onset of injury/illness/exacerbation    Comorbidities prostate CA with resection 2010, HTN, GERD, HLD, CKD, chronic systolic/diastolic HF, CHF, CAD, a-fib, anxiety, L medial partial knee arthroplasty 1990's    PT Frequency 2x / week    PT Duration 6 weeks    PT Treatment/Interventions ADLs/Self Care Home Management;Cryotherapy;Electrical Stimulation;Iontophoresis 4mg /ml Dexamethasone;Moist Heat;Balance training;Therapeutic exercise;Therapeutic activities;Functional mobility training;Stair training;Gait training;Ultrasound;Neuromuscular re-education;Patient/family education;Manual techniques;Vestibular;Vasopneumatic Device;Taping;Energy conservation;Dry needling;Passive range of motion    PT Next Visit Plan progress hip strengthening as tolerated; Balance training working on SLS and narrow BOS    Consulted and Agree with Plan of Care Patient             Patient will benefit from skilled therapeutic intervention in order to improve the following deficits and impairments:  Abnormal gait, Decreased endurance, Increased edema, Pain, Increased fascial restricitons, Decreased strength, Decreased activity tolerance, Decreased balance, Difficulty walking, Improper body mechanics, Decreased range of motion, Impaired flexibility, Postural dysfunction  Visit Diagnosis: Chronic pain of right knee  Muscle weakness (generalized)  Unsteadiness on feet  Other symptoms and signs involving the musculoskeletal system  Other abnormalities of gait and mobility     Problem List Patient Active Problem List   Diagnosis Date Noted   Pain of  knee joint with osteochondral injury 07/21/2020   Carcinoma of prostate ([place]) 06/30/2020   Congestive heart failure ([place]) 06/30/2020   Other psoriasis 06/30/2020   Other specified counseling 06/30/2020  Subchondral bone cyst 06/09/2020   OA (osteoarthritis) of knee 01/08/2020   Atypical atrial flutter (HCC)    Incidental pulmonary nodule 06/22/2014   Chronic kidney disease    S/P Maze operation for atrial fibrillation 10/29/2013   Penetrating atherosclerotic ulcer of aorta ([place]) 10/22/2013   Chronic atrial fibrillation ([place]) 09/24/2013   Dyslipidemia 07/15/2013   CAD (coronary artery disease), native coronary artery 06/28/2013   Dyspnea on exertion 06/26/2013   History of prostate cancer 06/26/2013   Chronic combined systolic and diastolic heart failure ([place]) 06/26/2013   NICM (nonischemic cardiomyopathy) with EF 20-25% 06/26/2013    Class: Diagnosis of   Atrial Fibrillation  06/25/2013   HTN (hypertension) 06/25/2013     Janene Harvey, PT, DPT 09/13/20 11:47 AM    [facility] [address]  [place], [place], [address] Phone: [phone]   Fax:  [phone]  Name: Joedy Eickhoff MRN: [id number] Date of Birth: 04-29-1940

## 2020-09-15 ENCOUNTER — Telehealth: Payer: Self-pay | Admitting: Cardiology

## 2020-09-15 DIAGNOSIS — I480 Paroxysmal atrial fibrillation: Secondary | ICD-10-CM

## 2020-09-15 MED ORDER — AMIODARONE HCL 200 MG PO TABS: ORAL_TABLET | ORAL | 1 refills | Status: DC

## 2020-09-15 NOTE — Telephone Encounter (Signed)
Rx(s) sent to pharmacy electronically.  

## 2020-09-15 NOTE — Telephone Encounter (Signed)
*  STAT* If patient is at the pharmacy, call can be transferred to refill team.   1. Which medications need to be refilled? (please list name of each medication and dose if known)  amiodarone (PACERONE) 200 MG tablet  2. Which pharmacy/location (including street and city if local pharmacy) is medication to be sent to? [facility] - [place], [place] - [address]  3. Do they need a 30 day or 90 day supply?   Patient states he has 2 tablets remaining. He is scheduled to see Dr. Stanford Breed on 12/29/20 and he is hoping for enough medication to last him until this appointment.

## 2020-09-16 ENCOUNTER — Encounter: Payer: Self-pay | Admitting: Physical Therapy

## 2020-09-16 ENCOUNTER — Other Ambulatory Visit: Payer: Self-pay

## 2020-09-16 ENCOUNTER — Ambulatory Visit: Payer: Medicare Other | Admitting: Physical Therapy

## 2020-09-16 DIAGNOSIS — M6281 Muscle weakness (generalized): Secondary | ICD-10-CM | POA: Diagnosis not present

## 2020-09-16 DIAGNOSIS — R2681 Unsteadiness on feet: Secondary | ICD-10-CM | POA: Diagnosis not present

## 2020-09-16 DIAGNOSIS — M25561 Pain in right knee: Secondary | ICD-10-CM

## 2020-09-16 DIAGNOSIS — R2689 Other abnormalities of gait and mobility: Secondary | ICD-10-CM

## 2020-09-16 DIAGNOSIS — R29898 Other symptoms and signs involving the musculoskeletal system: Secondary | ICD-10-CM

## 2020-09-16 DIAGNOSIS — G8929 Other chronic pain: Secondary | ICD-10-CM

## 2020-09-16 NOTE — Therapy (Signed)
[facility] [address]  [place], [place], [address] Phone: [phone]   Fax:  [phone]  Physical Therapy Treatment  Patient Details  Name: Jose Snyder MRN: [id number] Date of Birth: [birth year] Referring Provider (PT): Clearance Coots, MD   Encounter Date: 09/16/2020   PT End of Session - 09/16/20 0804     Visit Number 22    Number of Visits 27    Date for PT Re-Evaluation 10/04/20    Authorization Type Medicare & BCBS    PT Start Time 0801    PT Stop Time 0843    PT Time Calculation (min) 42 min    Equipment Utilized During Treatment Gait belt    Activity Tolerance Patient tolerated treatment well;Patient limited by pain;Patient limited by fatigue    Behavior During Therapy [facility] for tasks assessed/performed             Past Medical History:  Diagnosis Date   Anxiety    Arthritis    R hand- OA   Atrial Fibrillation  06/25/2013   CAD (coronary artery disease), native coronary artery 06/28/2013   a. LHC (06/27/13):  Mid LAD 40%, mid D1 75%, mid CFX 50-60%, mid RCA 50%, EF 20%, LVEDP 24 mmHg.   CHF (congestive heart failure) (HCC)    Chronic combined systolic and diastolic heart failure ([place]) 06/26/2013   a.  Echo (06/25/13):  EF 20-25%, diff HK, restrictive physio, mild MR, mod to severe LAE, mild RVE, mildly reduced RVSF, mod to severe RAE   Chronic kidney disease    Dyslipidemia 07/15/2013   Dysrhythmia    a-fib   GERD (gastroesophageal reflux disease)    Hypertension    Incidental pulmonary nodule 06/22/2014   Seen on CT angiogram of chest   NICM (nonischemic cardiomyopathy) with EF 20-25% 06/26/2013   Obesity    Penetrating atherosclerotic ulcer of aorta ([place]) 10/22/2013   Small penetrating ulcer of descending thoracic aorta discovered on routine CTA   Prostate cancer ([place])    watchful waiting, no treatment so far   S/P Maze operation for atrial fibrillation 10/29/2013   Complete bilateral atrial lesion  set using cryothermy and bipolar radiofrequency ablation with clipping of LA appendage via right mini thoracotomy   Shortness of breath    seen by Stanford Breed, & low energy     Past Surgical History:  Procedure Laterality Date   CARDIAC CATHETERIZATION  06/2013   CARDIOVERSION N/A 06/30/2013   Procedure: CARDIOVERSION;  Surgeon: [name], MD;  Location: [place];  Service: Cardiovascular;  Laterality: N/A;   CARDIOVERSION N/A 07/31/2013   Procedure: CARDIOVERSION;  Surgeon: Thayer Headings, MD;  Location: [place];  Service: Cardiovascular;  Laterality: N/A;   CARDIOVERSION N/A 03/19/2018   Procedure: CARDIOVERSION;  Surgeon: [name], MD;  Location: [place];  Service: Cardiovascular;  Laterality: N/A;   CARDIOVERSION N/A 07/24/2018   Procedure: CARDIOVERSION;  Surgeon: [name], MD;  [facility] ENDOSCOPY;  Service: Cardiovascular;  Laterality: N/A;   CLIPPING OF ATRIAL APPENDAGE  10/29/2013   Procedure: CLIPPING OF ATRIAL APPENDAGE;  Surgeon: Rexene Alberts, MD;  Location: [place];  Service: Open Heart Surgery;;   INTRAOPERATIVE TRANSESOPHAGEAL ECHOCARDIOGRAM N/A 10/29/2013   Procedure: INTRAOPERATIVE TRANSESOPHAGEAL ECHOCARDIOGRAM;  Surgeon: Rexene Alberts, MD;  Location: [place];  Service: Open Heart Surgery;  Laterality: N/A;   LEFT AND RIGHT HEART CATHETERIZATION WITH CORONARY ANGIOGRAM N/A 06/27/2013   Procedure: LEFT AND RIGHT HEART  CATHETERIZATION WITH CORONARY ANGIOGRAM;  Surgeon: Jettie Booze, MD;  Location: [facility] CATH LAB;  Service: Cardiovascular;  Laterality: N/A;   MEDIAL PARTIAL KNEE REPLACEMENT Left 1990's?   MINIMALLY INVASIVE MAZE PROCEDURE N/A 10/29/2013   Procedure: MINIMALLY INVASIVE MAZE PROCEDURE;  Surgeon: Rexene Alberts, MD;  Location: [place];  Service: Open Heart Surgery;  Laterality: N/A;   TEE WITHOUT CARDIOVERSION N/A 06/30/2013   Procedure: TRANSESOPHAGEAL ECHOCARDIOGRAM (TEE) ;  Surgeon: [name], MD;  Location: [place];  Service:  Cardiovascular;  Laterality: N/A;   TRANSURETHRAL RESECTION OF PROSTATE  2010    There were no vitals filed for this visit.   Subjective Assessment - 09/16/20 0803     Subjective Pt. reports he is "running a little slow this morning" but otherwise no new concerns, knee about the same,    Pertinent History prostate CA with resection 2010, HTN, GERD, HLD, CKD, chronic systolic/diastolic HF, CHF, CAD, a-fib, anxiety, L medial partial knee arthroplasty 1990's    Limitations House hold activities;Walking;Standing;Lifting;Sitting    How long can you stand comfortably? 5 min    How long can you walk comfortably? 5 min    Diagnostic tests 06/19/20 R knee MRI: Radial tear involving the posterior horn of the medial meniscus, probably remote large osteochondral lesion of medial femoral condyle    Patient Stated Goals decrease pain and increase strength    Currently in Pain? Yes    Pain Score 5     Pain Location Knee    Pain Orientation Right    Pain Descriptors / Indicators Aching                               [facility] Adult PT Treatment/Exercise - 09/16/20 0001       High Level Balance   High Level Balance Activities Other (comment)    High Level Balance Comments weight shifting at counter with 1 UE support, foward steps x 10 bil, side step x 10 bil, retro step x 10 bil; standing on airex - eyes open x 15 sec, with head turns x 10, with head nods x 10, 2UE support on counter      Knee/Hip Exercises: Aerobic   Nustep L5x93min (UEs/LEs)      Knee/Hip Exercises: Standing   Lateral Step Up 10 reps;2 sets;Both    Lateral Step Up Limitations 1 riser, with wall for support    SLS 2 x 10 sec bil, bil UE support counter (light)    Other Standing Knee Exercises hamstring curls 2 x 10 bil; calf raises 2 x 10 2 UE support                    PT Education - 09/16/20 6948     Education Details educated on importance of HEP and hip strengthening to improve balance.     Person(s) Educated Patient    Methods Explanation    Comprehension Verbalized understanding              PT Short Term Goals - 08/23/20 1103       PT SHORT TERM GOAL #1   Title Patient to be independent with initial HEP.    Status Achieved               PT Long Term Goals - 08/25/20 1053       PT LONG TERM GOAL #1   Title Patient to be independent  with advanced HEP.    Time 6    Status Partially Met   met for current     PT LONG TERM GOAL #2   Title Patient to demonstrate B LE strength >/=4+/5.    Status Achieved      PT LONG TERM GOAL #3   Title Patient to demonstrate R knee AROM/PROM 0-120 degrees.    Status Achieved      PT LONG TERM GOAL #4   Title Patient to score >/=48/56 on Berg in order to decrease risk of falls.    Time 6    Status Partially Met   08/23/20: 46/56     PT LONG TERM GOAL #5   Title Patient to report 85% improvement in balance confidence with shower transfers.    Time 6    Status Partially Met   reports 40-50% improvement     PT LONG TERM GOAL #6   Title Patient to ambulate atleast 1266ft with 6 minute walk test with 4WW in order to improve functional activity tolerance and endurance.    Status On-going   ambulated 740 ft during 6 min walk test                  Plan - 09/16/20 0938     Clinical Impression Statement Pt. participated well throughout session despite R knee pain during activities, initially very unsteady with activities like step ups, but improved steadiness as session progressed, close SBA throughout session for safety.  He also required frequent short seated rest breaks and cues for breathing due to DOE.  Able to hold tandem stance x 5 sec without support today. Pt. noted that he felt much steadier when standing on airex today compared to previous sessions.   Continues to require skilled therapy to improve balance and LE strength for safety.    Personal Factors and Comorbidities Age;Comorbidity  3+;Fitness;Past/Current Experience;Time since onset of injury/illness/exacerbation    Comorbidities prostate CA with resection 2010, HTN, GERD, HLD, CKD, chronic systolic/diastolic HF, CHF, CAD, a-fib, anxiety, L medial partial knee arthroplasty 1990's    PT Frequency 2x / week    PT Duration 6 weeks    PT Treatment/Interventions ADLs/Self Care Home Management;Cryotherapy;Electrical Stimulation;Iontophoresis 4mg /ml Dexamethasone;Moist Heat;Balance training;Therapeutic exercise;Therapeutic activities;Functional mobility training;Stair training;Gait training;Ultrasound;Neuromuscular re-education;Patient/family education;Manual techniques;Vestibular;Vasopneumatic Device;Taping;Energy conservation;Dry needling;Passive range of motion    PT Next Visit Plan continue to progress hip strengthening as tolerated; Balance training working on SLS and narrow BOS    Consulted and Agree with Plan of Care Patient             Patient will benefit from skilled therapeutic intervention in order to improve the following deficits and impairments:  Abnormal gait, Decreased endurance, Increased edema, Pain, Increased fascial restricitons, Decreased strength, Decreased activity tolerance, Decreased balance, Difficulty walking, Improper body mechanics, Decreased range of motion, Impaired flexibility, Postural dysfunction  Visit Diagnosis: Chronic pain of right knee  Muscle weakness (generalized)  Unsteadiness on feet  Other symptoms and signs involving the musculoskeletal system  Other abnormalities of gait and mobility     Problem List Patient Active Problem List   Diagnosis Date Noted   Pain of knee joint with osteochondral injury 07/21/2020   Carcinoma of prostate ([place]) 06/30/2020   Congestive heart failure ([place]) 06/30/2020   Other psoriasis 06/30/2020   Other specified counseling 06/30/2020   Subchondral bone cyst 06/09/2020   OA (osteoarthritis) of knee 01/08/2020   Atypical atrial flutter (HCC)     Incidental pulmonary nodule  06/22/2014   Chronic kidney disease    S/P Maze operation for atrial fibrillation 10/29/2013   Penetrating atherosclerotic ulcer of aorta ([place]) 10/22/2013   Chronic atrial fibrillation ([place]) 09/24/2013   Dyslipidemia 07/15/2013   CAD (coronary artery disease), native coronary artery 06/28/2013   Dyspnea on exertion 06/26/2013   History of prostate cancer 06/26/2013   Chronic combined systolic and diastolic heart failure ([place]) 06/26/2013   NICM (nonischemic cardiomyopathy) with EF 20-25% 06/26/2013    Class: Diagnosis of   Atrial Fibrillation  06/25/2013   HTN (hypertension) 06/25/2013    Rennie Natter PT, DPT 09/16/2020, 8:53 AM  [facility] [address]  [place], [place], [address] Phone: [phone]   Fax:  [phone]  Name: Jose Snyder MRN: [id number] Date of Birth: 1940-11-24

## 2020-09-20 ENCOUNTER — Ambulatory Visit: Payer: Medicare Other | Admitting: Physical Therapy

## 2020-09-20 ENCOUNTER — Other Ambulatory Visit: Payer: Self-pay

## 2020-09-20 ENCOUNTER — Encounter: Payer: Self-pay | Admitting: Physical Therapy

## 2020-09-20 DIAGNOSIS — M6281 Muscle weakness (generalized): Secondary | ICD-10-CM | POA: Diagnosis not present

## 2020-09-20 DIAGNOSIS — M25561 Pain in right knee: Secondary | ICD-10-CM | POA: Diagnosis not present

## 2020-09-20 DIAGNOSIS — R2681 Unsteadiness on feet: Secondary | ICD-10-CM | POA: Diagnosis not present

## 2020-09-20 DIAGNOSIS — R29898 Other symptoms and signs involving the musculoskeletal system: Secondary | ICD-10-CM | POA: Diagnosis not present

## 2020-09-20 DIAGNOSIS — G8929 Other chronic pain: Secondary | ICD-10-CM

## 2020-09-20 DIAGNOSIS — R2689 Other abnormalities of gait and mobility: Secondary | ICD-10-CM

## 2020-09-20 NOTE — Therapy (Signed)
[facility] [address]  [place], [place], [address] Phone: [phone]   Fax:  [phone]  Physical Therapy Treatment  Patient Details  Name: Jose Snyder MRN: [id number] Date of Birth: 27-Jul-1940 Referring Provider (PT): Clearance Coots, MD   Encounter Date: 09/20/2020   PT End of Session - 09/20/20 1149     Visit Number 23    Number of Visits 27    Date for PT Re-Evaluation 10/04/20    Authorization Type Medicare & BCBS    PT Start Time 1101    PT Stop Time 1141    PT Time Calculation (min) 40 min    Activity Tolerance Patient tolerated treatment well;Patient limited by pain;Patient limited by fatigue    Behavior During Therapy [facility] for tasks assessed/performed             Past Medical History:  Diagnosis Date   Anxiety    Arthritis    R hand- OA   Atrial Fibrillation  06/25/2013   CAD (coronary artery disease), native coronary artery 06/28/2013   a. LHC (06/27/13):  Mid LAD 40%, mid D1 75%, mid CFX 50-60%, mid RCA 50%, EF 20%, LVEDP 24 mmHg.   CHF (congestive heart failure) (HCC)    Chronic combined systolic and diastolic heart failure ([place]) 06/26/2013   a.  Echo (06/25/13):  EF 20-25%, diff HK, restrictive physio, mild MR, mod to severe LAE, mild RVE, mildly reduced RVSF, mod to severe RAE   Chronic kidney disease    Dyslipidemia 07/15/2013   Dysrhythmia    a-fib   GERD (gastroesophageal reflux disease)    Hypertension    Incidental pulmonary nodule 06/22/2014   Seen on CT angiogram of chest   NICM (nonischemic cardiomyopathy) with EF 20-25% 06/26/2013   Obesity    Penetrating atherosclerotic ulcer of aorta ([place]) 10/22/2013   Small penetrating ulcer of descending thoracic aorta discovered on routine CTA   Prostate cancer ([place])    watchful waiting, no treatment so far   S/P Maze operation for atrial fibrillation 10/29/2013   Complete bilateral atrial lesion set using cryothermy and bipolar radiofrequency  ablation with clipping of LA appendage via right mini thoracotomy   Shortness of breath    seen by Stanford Breed, & low energy     Past Surgical History:  Procedure Laterality Date   CARDIAC CATHETERIZATION  06/2013   CARDIOVERSION N/A 06/30/2013   Procedure: CARDIOVERSION;  Surgeon: [name], MD;  Location: [place];  Service: Cardiovascular;  Laterality: N/A;   CARDIOVERSION N/A 07/31/2013   Procedure: CARDIOVERSION;  Surgeon: Thayer Headings, MD;  Location: [place];  Service: Cardiovascular;  Laterality: N/A;   CARDIOVERSION N/A 03/19/2018   Procedure: CARDIOVERSION;  Surgeon: [name], MD;  Location: [facility];  Service: Cardiovascular;  Laterality: N/A;   CARDIOVERSION N/A 07/24/2018   Procedure: CARDIOVERSION;  Surgeon: [name], MD;  Location: [facility] ENDOSCOPY;  Service: Cardiovascular;  Laterality: N/A;   CLIPPING OF ATRIAL APPENDAGE  10/29/2013   Procedure: CLIPPING OF ATRIAL APPENDAGE;  Surgeon: Rexene Alberts, MD;  Location: [place];  Service: Open Heart Surgery;;   INTRAOPERATIVE TRANSESOPHAGEAL ECHOCARDIOGRAM N/A 10/29/2013   Procedure: INTRAOPERATIVE TRANSESOPHAGEAL ECHOCARDIOGRAM;  Surgeon: Rexene Alberts, MD;  Location: [place];  Service: Open Heart Surgery;  Laterality: N/A;   LEFT AND RIGHT HEART CATHETERIZATION WITH CORONARY ANGIOGRAM N/A 06/27/2013   Procedure: LEFT AND RIGHT HEART CATHETERIZATION WITH CORONARY ANGIOGRAM;  Surgeon: Jettie Booze,  MD;  Location: [place] CATH LAB;  Service: Cardiovascular;  Laterality: N/A;   MEDIAL PARTIAL KNEE REPLACEMENT Left 1990's?   MINIMALLY INVASIVE MAZE PROCEDURE N/A 10/29/2013   Procedure: MINIMALLY INVASIVE MAZE PROCEDURE;  Surgeon: Rexene Alberts, MD;  Location: [place];  Service: Open Heart Surgery;  Laterality: N/A;   TEE WITHOUT CARDIOVERSION N/A 06/30/2013   Procedure: TRANSESOPHAGEAL ECHOCARDIOGRAM (TEE) ;  Surgeon: [name], MD;  Location: [place];  Service: Cardiovascular;  Laterality: N/A;    TRANSURETHRAL RESECTION OF PROSTATE  2010    There were no vitals filed for this visit.   Subjective Assessment - 09/20/20 1103     Subjective Pt. reports he is "hot and tired" today already, was cleaning up kitchen.  R knee felt ok when woke up but is aching now. Reports he was really sore after last session, but he had also done a lot on his feet that day in addition to therapy.  He would like to do some stretches.  Is supposed to get shot for R knee on Thursday.    Pertinent History prostate CA with resection 2010, HTN, GERD, HLD, CKD, chronic systolic/diastolic HF, CHF, CAD, a-fib, anxiety, L medial partial knee arthroplasty 1990's    Limitations House hold activities;Walking;Standing;Lifting;Sitting    How long can you stand comfortably? 5 min    How long can you walk comfortably? 5 min    Diagnostic tests 06/19/20 R knee MRI: Radial tear involving the posterior horn of the medial meniscus, probably remote large osteochondral lesion of medial femoral condyle    Patient Stated Goals decrease pain and increase strength    Currently in Pain? Yes    Pain Score 5     Pain Location Knee    Pain Orientation Right                               [facility] Adult PT Treatment/Exercise - 09/20/20 0001       Knee/Hip Exercises: Stretches   Passive Hamstring Stretch Both;3 reps;60 seconds    Piriformis Stretch Both;1 rep;20 seconds    Piriformis Stretch Limitations difficult on R side due to knee pain, seated piriformis stretches, pull across on R tolerated, both directions on L tolerated. Demo and vC for all stretches.      Knee/Hip Exercises: Aerobic   Nustep L5x12min (UEs/LEs)      Knee/Hip Exercises: Supine   Bridges Strengthening;Both;1 set;10 reps, cues for TrA contraction   Straight Leg Raises Strengthening;Both;2 sets;10 reps, cues for TrA contraction   Other Supine Knee/Hip Exercises lower trunk rotations x 10 15 sec hold, SKTC stretch 2 x 30 sec, seated upper trunk  twist.      Knee/Hip Exercises: Sidelying   Clams 2 x 10 bil                    PT Education - 09/20/20 1137     Education Details progressed HEP for supine hip strengthening and stretches, Access Code: [email or web address]    Person(s) Educated Patient    Methods Explanation;Demonstration;Verbal cues;Handout    Comprehension Verbalized understanding              PT Short Term Goals - 08/23/20 1103       PT SHORT TERM GOAL #1   Title Patient to be independent with initial HEP.    Status Achieved  PT Long Term Goals - 08/25/20 1053       PT LONG TERM GOAL #1   Title Patient to be independent with advanced HEP.    Time 6    Status Partially Met   met for current     PT LONG TERM GOAL #2   Title Patient to demonstrate B LE strength >/=4+/5.    Status Achieved      PT LONG TERM GOAL #3   Title Patient to demonstrate R knee AROM/PROM 0-120 degrees.    Status Achieved      PT LONG TERM GOAL #4   Title Patient to score >/=48/56 on Berg in order to decrease risk of falls.    Time 6    Status Partially Met   08/23/20: 46/56     PT LONG TERM GOAL #5   Title Patient to report 85% improvement in balance confidence with shower transfers.    Time 6    Status Partially Met   reports 40-50% improvement     PT LONG TERM GOAL #6   Title Patient to ambulate atleast 1264ft with 6 minute walk test with 4WW in order to improve functional activity tolerance and endurance.    Status On-going   ambulated 740 ft during 6 min walk test                  Plan - 09/20/20 1149     Clinical Impression Statement Focus of session today was on stretching and supine exercises to improve flexibility since pt. reports feeling very tight, and review of supine exercises for LE strengthening to avoid increased knee pain today.  Hopefully after R knee injection his tolerance to standing exercises will be better.  Good tolerance to most exercises except piriformis stretch  on R due to knee pain.  He reported being challenged by today's exercises but felt he walked better afterwards.  HEP reviewed and progressed.  Will continue to benefit from skilled therapy to improve balance and LE strength for safety.    Personal Factors and Comorbidities Age;Comorbidity 3+;Fitness;Past/Current Experience;Time since onset of injury/illness/exacerbation    Comorbidities prostate CA with resection 2010, HTN, GERD, HLD, CKD, chronic systolic/diastolic HF, CHF, CAD, a-fib, anxiety, L medial partial knee arthroplasty 1990's    PT Frequency 2x / week    PT Duration 6 weeks    PT Treatment/Interventions ADLs/Self Care Home Management;Cryotherapy;Electrical Stimulation;Iontophoresis 4mg /ml Dexamethasone;Moist Heat;Balance training;Therapeutic exercise;Therapeutic activities;Functional mobility training;Stair training;Gait training;Ultrasound;Neuromuscular re-education;Patient/family education;Manual techniques;Vestibular;Vasopneumatic Device;Taping;Energy conservation;Dry needling;Passive range of motion    PT Next Visit Plan continue to progress hip strengthening as tolerated; Balance training working on SLS and narrow BOS    PT Home Exercise Plan Access Code: [email or web address]    Consulted and Agree with Plan of Care Patient             Patient will benefit from skilled therapeutic intervention in order to improve the following deficits and impairments:  Abnormal gait, Decreased endurance, Increased edema, Pain, Increased fascial restricitons, Decreased strength, Decreased activity tolerance, Decreased balance, Difficulty walking, Improper body mechanics, Decreased range of motion, Impaired flexibility, Postural dysfunction  Visit Diagnosis: Chronic pain of right knee  Muscle weakness (generalized)  Unsteadiness on feet  Other symptoms and signs involving the musculoskeletal system  Other abnormalities of gait and mobility     Problem List Patient Active Problem List   Diagnosis  Date Noted   Pain of knee joint with osteochondral injury 07/21/2020   Carcinoma of prostate ([place]) 06/30/2020  Congestive heart failure ([place]) 06/30/2020   Other psoriasis 06/30/2020   Other specified counseling 06/30/2020   Subchondral bone cyst 06/09/2020   OA (osteoarthritis) of knee 01/08/2020   Atypical atrial flutter (HCC)    Incidental pulmonary nodule 06/22/2014   Chronic kidney disease    S/P Maze operation for atrial fibrillation 10/29/2013   Penetrating atherosclerotic ulcer of aorta ([place]) 10/22/2013   Chronic atrial fibrillation ([place]) 09/24/2013   Dyslipidemia 07/15/2013   CAD (coronary artery disease), native coronary artery 06/28/2013   Dyspnea on exertion 06/26/2013   History of prostate cancer 06/26/2013   Chronic combined systolic and diastolic heart failure ([place]) 06/26/2013   NICM (nonischemic cardiomyopathy) with EF 20-25% 06/26/2013    Class: Diagnosis of   Atrial Fibrillation  06/25/2013   HTN (hypertension) 06/25/2013    Rennie Natter PT, DPT 09/20/2020, 11:56 AM  [facility] [address]  [place], [place], [address] Phone: [phone]   Fax:  [phone]  Name: Jose Snyder MRN: [id number] Date of Birth: Sep 24, 1940

## 2020-09-20 NOTE — Patient Instructions (Signed)
Access Code: [email or web address] URL: [email or web address] Date: 09/20/2020 Prepared by: Glenetta Hew  Exercises Supine Lower Trunk Rotation - 1 x daily - 7 x weekly - 1 sets - 10 reps - 15 hold Supine Bridge - 1 x daily - 7 x weekly - 2 sets - 10 reps Clamshell - 1 x daily - 7 x weekly - 2 sets - 10 reps Seated Hamstring Stretch - 2 x daily - 7 x weekly - 1 sets - 3 reps - 1 min hold Active Straight Leg Raise with Quad Set - 1 x daily - 7 x weekly - 2 sets - 10 reps

## 2020-09-23 ENCOUNTER — Other Ambulatory Visit: Payer: Self-pay

## 2020-09-23 ENCOUNTER — Ambulatory Visit: Payer: Medicare Other

## 2020-09-23 DIAGNOSIS — M6281 Muscle weakness (generalized): Secondary | ICD-10-CM

## 2020-09-23 DIAGNOSIS — G8929 Other chronic pain: Secondary | ICD-10-CM

## 2020-09-23 DIAGNOSIS — R29898 Other symptoms and signs involving the musculoskeletal system: Secondary | ICD-10-CM | POA: Diagnosis not present

## 2020-09-23 DIAGNOSIS — R2689 Other abnormalities of gait and mobility: Secondary | ICD-10-CM | POA: Diagnosis not present

## 2020-09-23 DIAGNOSIS — R2681 Unsteadiness on feet: Secondary | ICD-10-CM

## 2020-09-23 DIAGNOSIS — M25561 Pain in right knee: Secondary | ICD-10-CM | POA: Diagnosis not present

## 2020-09-23 NOTE — Therapy (Signed)
[facility] [address]  [place], [place], [address] Phone: [phone]   Fax:  [phone]  Physical Therapy Treatment  Patient Details  Name: Jose Snyder MRN: [id number] Date of Birth: 02-17-1941 Referring Provider (PT): Clearance Coots, MD   Encounter Date: 09/23/2020   PT End of Session - 09/23/20 1201     Visit Number 24    Number of Visits 27    Date for PT Re-Evaluation 10/04/20    Authorization Type Medicare & BCBS    PT Start Time 2202    PT Stop Time 1144    PT Time Calculation (min) 42 min    Activity Tolerance Patient tolerated treatment well    Behavior During Therapy [facility] for tasks assessed/performed             Past Medical History:  Diagnosis Date   Anxiety    Arthritis    R hand- OA   Atrial Fibrillation  06/25/2013   CAD (coronary artery disease), native coronary artery 06/28/2013   a. LHC (06/27/13):  Mid LAD 40%, mid D1 75%, mid CFX 50-60%, mid RCA 50%, EF 20%, LVEDP 24 mmHg.   CHF (congestive heart failure) (HCC)    Chronic combined systolic and diastolic heart failure ([place]) 06/26/2013   a.  Echo (06/25/13):  EF 20-25%, diff HK, restrictive physio, mild MR, mod to severe LAE, mild RVE, mildly reduced RVSF, mod to severe RAE   Chronic kidney disease    Dyslipidemia 07/15/2013   Dysrhythmia    a-fib   GERD (gastroesophageal reflux disease)    Hypertension    Incidental pulmonary nodule 06/22/2014   Seen on CT angiogram of chest   NICM (nonischemic cardiomyopathy) with EF 20-25% 06/26/2013   Obesity    Penetrating atherosclerotic ulcer of aorta ([place]) 10/22/2013   Small penetrating ulcer of descending thoracic aorta discovered on routine CTA   Prostate cancer ([place])    watchful waiting, no treatment so far   S/P Maze operation for atrial fibrillation 10/29/2013   Complete bilateral atrial lesion set using cryothermy and bipolar radiofrequency ablation with clipping of LA appendage via right mini  thoracotomy   Shortness of breath    seen by Stanford Breed, & low energy     Past Surgical History:  Procedure Laterality Date   CARDIAC CATHETERIZATION  06/2013   CARDIOVERSION N/A 06/30/2013   Procedure: CARDIOVERSION;  Surgeon: [name], MD;  Location: [place];  Service: Cardiovascular;  Laterality: N/A;   CARDIOVERSION N/A 07/31/2013   Procedure: CARDIOVERSION;  Surgeon: Thayer Headings, MD;  Location: [place];  Service: Cardiovascular;  Laterality: N/A;   CARDIOVERSION N/A 03/19/2018   Procedure: CARDIOVERSION;  Surgeon: [name], MD;  Location: [place];  Service: Cardiovascular;  Laterality: N/A;   CARDIOVERSION N/A 07/24/2018   Procedure: CARDIOVERSION;  Surgeon: [name], MD;  Location: [facility] ENDOSCOPY;  Service: Cardiovascular;  Laterality: N/A;   CLIPPING OF ATRIAL APPENDAGE  10/29/2013   Procedure: CLIPPING OF ATRIAL APPENDAGE;  Surgeon: Rexene Alberts, MD;  Location: [place];  Service: Open Heart Surgery;;   INTRAOPERATIVE TRANSESOPHAGEAL ECHOCARDIOGRAM N/A 10/29/2013   Procedure: INTRAOPERATIVE TRANSESOPHAGEAL ECHOCARDIOGRAM;  Surgeon: Rexene Alberts, MD;  Location: [place];  Service: Open Heart Surgery;  Laterality: N/A;   LEFT AND RIGHT HEART CATHETERIZATION WITH CORONARY ANGIOGRAM N/A 06/27/2013   Procedure: LEFT AND RIGHT HEART CATHETERIZATION WITH CORONARY ANGIOGRAM;  Surgeon: Jettie Booze, MD;  Location: [facility] CATH LAB;  Service: Cardiovascular;  Laterality: N/A;   MEDIAL PARTIAL KNEE REPLACEMENT Left 1990's?   MINIMALLY INVASIVE MAZE PROCEDURE N/A 10/29/2013   Procedure: MINIMALLY INVASIVE MAZE PROCEDURE;  Surgeon: Rexene Alberts, MD;  Location: [place];  Service: Open Heart Surgery;  Laterality: N/A;   TEE WITHOUT CARDIOVERSION N/A 06/30/2013   Procedure: TRANSESOPHAGEAL ECHOCARDIOGRAM (TEE) ;  Surgeon: [name], MD;  Location: [place];  Service: Cardiovascular;  Laterality: N/A;   TRANSURETHRAL RESECTION OF PROSTATE  2010    There were no  vitals filed for this visit.   Subjective Assessment - 09/23/20 1104     Subjective Pt was at [facility] office earlier today, did a lot of walking and feels winded.    Pertinent History prostate CA with resection 2010, HTN, GERD, HLD, CKD, chronic systolic/diastolic HF, CHF, CAD, a-fib, anxiety, L medial partial knee arthroplasty 1990's    Diagnostic tests 06/19/20 R knee MRI: Radial tear involving the posterior horn of the medial meniscus, probably remote large osteochondral lesion of medial femoral condyle    Patient Stated Goals decrease pain and increase strength    Currently in Pain? Yes    Pain Score 5     Pain Location Knee    Pain Orientation Right    Pain Descriptors / Indicators Aching    Pain Type Chronic pain                               [facility] Adult PT Treatment/Exercise - 09/23/20 0001       Knee/Hip Exercises: Stretches   Passive Hamstring Stretch Right;2 reps;30 seconds    Hip Flexor Stretch Right;2 reps;30 seconds    Hip Flexor Stretch Limitations mod thomas strap      Knee/Hip Exercises: Aerobic   Recumbent Bike L3x19mn      Knee/Hip Exercises: Machines for Strengthening   Cybex Knee Extension 5# with R LE 10 reps    Cybex Knee Flexion 10# with R LE 10 reps      Knee/Hip Exercises: Supine   Bridges with Clamshell Strengthening;Both;2 sets;10 reps   green TB   Straight Leg Raises Strengthening;Right;2 sets;10 reps    Straight Leg Raises Limitations 3#      Knee/Hip Exercises: Sidelying   Hip ABduction Strengthening;Right;10 reps    Hip ABduction Limitations 3#    Clams 2x10 with green TB R    Other Sidelying Knee/Hip Exercises fire hydrant 10 reps with 3#                      PT Short Term Goals - 08/23/20 1103       PT SHORT TERM GOAL #1   Title Patient to be independent with initial HEP.    Status Achieved               PT Long Term Goals - 08/25/20 1053       PT LONG TERM GOAL #1   Title Patient to be  independent with advanced HEP.    Time 6    Status Partially Met   met for current     PT LONG TERM GOAL #2   Title Patient to demonstrate B LE strength >/=4+/5.    Status Achieved      PT LONG TERM GOAL #3   Title Patient to demonstrate R knee AROM/PROM 0-120 degrees.    Status Achieved      PT LONG TERM GOAL #  4   Title Patient to score >/=48/56 on Berg in order to decrease risk of falls.    Time 6    Status Partially Met   08/23/20: 46/56     PT LONG TERM GOAL #5   Title Patient to report 85% improvement in balance confidence with shower transfers.    Time 6    Status Partially Met   reports 40-50% improvement     PT LONG TERM GOAL #6   Title Patient to ambulate atleast 1259f with 6 minute walk test with 4WW in order to improve functional activity tolerance and endurance.    Status On-going   ambulated 740 ft during 6 min walk test                  Plan - 09/23/20 1203     Clinical Impression Statement [place] We proceeded with non WB ther ex due to him feeling more fatigued from walking earlier. Pt still demonstrating tight knee musculature but reports that stretches feel good in his R knee. He showed difficulty mostly with the S/L exercises and required cues for controlled movement. He states that he would like to continue working with PT but will further discuss continuation with his MD on Monday. He responded well to treatment.    Personal Factors and Comorbidities Age;Comorbidity 3+;Fitness;Past/Current Experience;Time since onset of injury/illness/exacerbation    Comorbidities prostate CA with resection 2010, HTN, GERD, HLD, CKD, chronic systolic/diastolic HF, CHF, CAD, a-fib, anxiety, L medial partial knee arthroplasty 1990's    PT Frequency 2x / week    PT Duration 6 weeks    PT Treatment/Interventions ADLs/Self Care Home Management;Cryotherapy;Electrical Stimulation;Iontophoresis 4109mml  Dexamethasone;Moist Heat;Balance training;Therapeutic exercise;Therapeutic activities;Functional mobility training;Stair training;Gait training;Ultrasound;Neuromuscular re-education;Patient/family education;Manual techniques;Vestibular;Vasopneumatic Device;Taping;Energy conservation;Dry needling;Passive range of motion    PT Next Visit Plan continue to progress hip strengthening as tolerated; Balance training working on SLS and narrow BOS    PT Home Exercise Plan Access Code: [email or web address]    Consulted and Agree with Plan of Care Patient             Patient will benefit from skilled therapeutic intervention in order to improve the following deficits and impairments:  Abnormal gait, Decreased endurance, Increased edema, Pain, Increased fascial restricitons, Decreased strength, Decreased activity tolerance, Decreased balance, Difficulty walking, Improper body mechanics, Decreased range of motion, Impaired flexibility, Postural dysfunction  Visit Diagnosis: Chronic pain of right knee  Muscle weakness (generalized)  Unsteadiness on feet  Other symptoms and signs involving the musculoskeletal system  Other abnormalities of gait and mobility     Problem List Patient Active Problem List   Diagnosis Date Noted   Pain of knee joint with osteochondral injury 07/21/2020   Carcinoma of prostate ([facility]05/05/2020   Congestive heart failure ([facility]05/05/2020   Other psoriasis 06/30/2020   Other specified counseling 06/30/2020   Subchondral bone cyst 06/09/2020   OA (osteoarthritis) of knee 01/08/2020   Atypical atrial flutter (HCC)    Incidental pulmonary nodule 06/22/2014   Chronic kidney disease    S/P Maze operation for atrial fibrillation 10/29/2013   Penetrating atherosclerotic ulcer of aorta ([facility]08/26/2015   Chronic atrial fibrillation ([facility]07/29/2015   Dyslipidemia 07/15/2013   CAD (coronary artery disease), native coronary artery 06/28/2013   Dyspnea on exertion 06/26/2013    History of prostate cancer 06/26/2013   Chronic combined systolic and diastolic heart failure ([facility]04/30/2015  NICM (nonischemic cardiomyopathy) with EF 20-25% 06/26/2013    Class: Diagnosis of   Atrial Fibrillation  06/25/2013   HTN (hypertension) 06/25/2013    Artist Pais, PTA 09/23/2020, 12:08 PM  [facility] [address]  [place], [place], [address] Phone: [phone]   Fax:  [phone]  Name: Davinder Haff MRN: [id number] Date of Birth: 02-Apr-1940

## 2020-09-27 ENCOUNTER — Ambulatory Visit (INDEPENDENT_AMBULATORY_CARE_PROVIDER_SITE_OTHER): Payer: Medicare Other | Admitting: Family Medicine

## 2020-09-27 ENCOUNTER — Ambulatory Visit: Payer: Medicare Other | Attending: Family Medicine

## 2020-09-27 ENCOUNTER — Other Ambulatory Visit: Payer: Self-pay

## 2020-09-27 ENCOUNTER — Encounter: Payer: Self-pay | Admitting: Family Medicine

## 2020-09-27 VITALS — BP 104/56 | Ht 74.0 in | Wt 260.0 lb

## 2020-09-27 DIAGNOSIS — M25561 Pain in right knee: Secondary | ICD-10-CM | POA: Diagnosis not present

## 2020-09-27 DIAGNOSIS — R2689 Other abnormalities of gait and mobility: Secondary | ICD-10-CM | POA: Insufficient documentation

## 2020-09-27 DIAGNOSIS — M1711 Unilateral primary osteoarthritis, right knee: Secondary | ICD-10-CM

## 2020-09-27 DIAGNOSIS — M6281 Muscle weakness (generalized): Secondary | ICD-10-CM | POA: Insufficient documentation

## 2020-09-27 DIAGNOSIS — R29898 Other symptoms and signs involving the musculoskeletal system: Secondary | ICD-10-CM | POA: Diagnosis not present

## 2020-09-27 DIAGNOSIS — R2681 Unsteadiness on feet: Secondary | ICD-10-CM | POA: Diagnosis not present

## 2020-09-27 DIAGNOSIS — G8929 Other chronic pain: Secondary | ICD-10-CM | POA: Insufficient documentation

## 2020-09-27 MED ORDER — HYDROCODONE-ACETAMINOPHEN 5-325 MG PO TABS: 1.0000 | ORAL_TABLET | Freq: Three times a day (TID) | ORAL | 0 refills | Status: DC | PRN

## 2020-09-27 NOTE — Progress Notes (Signed)
Jose Snyder - 80 y.o. male MRN [id number]  Date of birth: April 18, 1940  SUBJECTIVE:  Including CC & ROS.  No chief complaint on file.   Jose Snyder is a 80 y.o. male that is following up for his right knee pain.  Has been doing well in physical therapy.  Only has pain if walking for a period of time and transitioning from sitting to standing.  Does feel improved from when his pain initially began.    Review of Systems See HPI   HISTORY: Past Medical, Surgical, Social, and Family History Reviewed & Updated per EMR.   Pertinent Historical Findings include:  Past Medical History:  Diagnosis Date   Anxiety    Arthritis    R hand- OA   Atrial Fibrillation  06/25/2013   CAD (coronary artery disease), native coronary artery 06/28/2013   a. LHC (06/27/13):  Mid LAD 40%, mid D1 75%, mid CFX 50-60%, mid RCA 50%, EF 20%, LVEDP 24 mmHg.   CHF (congestive heart failure) (HCC)    Chronic combined systolic and diastolic heart failure ([place]) 06/26/2013   a.  Echo (06/25/13):  EF 20-25%, diff HK, restrictive physio, mild MR, mod to severe LAE, mild RVE, mildly reduced RVSF, mod to severe RAE   Chronic kidney disease    Dyslipidemia 07/15/2013   Dysrhythmia    a-fib   GERD (gastroesophageal reflux disease)    Hypertension    Incidental pulmonary nodule 06/22/2014   Seen on CT angiogram of chest   NICM (nonischemic cardiomyopathy) with EF 20-25% 06/26/2013   Obesity    Penetrating atherosclerotic ulcer of aorta ([place]) 10/22/2013   Small penetrating ulcer of descending thoracic aorta discovered on routine CTA   Prostate cancer ([place])    watchful waiting, no treatment so far   S/P Maze operation for atrial fibrillation 10/29/2013   Complete bilateral atrial lesion set using cryothermy and bipolar radiofrequency ablation with clipping of LA appendage via right mini thoracotomy   Shortness of breath    seen by Stanford Breed, & low energy     Past Surgical History:  Procedure Laterality Date   CARDIAC  CATHETERIZATION  06/2013   CARDIOVERSION N/A 06/30/2013   Procedure: CARDIOVERSION;  Surgeon: [name], MD;  Location: [place];  Service: Cardiovascular;  Laterality: N/A;   CARDIOVERSION N/A 07/31/2013   Procedure: CARDIOVERSION;  Surgeon: Thayer Headings, MD;  Location: [place];  Service: Cardiovascular;  Laterality: N/A;   CARDIOVERSION N/A 03/19/2018   Procedure: CARDIOVERSION;  Surgeon: [name], MD;  Location: [place];  Service: Cardiovascular;  Laterality: N/A;   CARDIOVERSION N/A 07/24/2018   Procedure: CARDIOVERSION;  Surgeon: [name], MD;  Location: [facility] ENDOSCOPY;  Service: Cardiovascular;  Laterality: N/A;   CLIPPING OF ATRIAL APPENDAGE  10/29/2013   Procedure: CLIPPING OF ATRIAL APPENDAGE;  Surgeon: Rexene Alberts, MD;  Location: [place];  Service: Open Heart Surgery;;   INTRAOPERATIVE TRANSESOPHAGEAL ECHOCARDIOGRAM N/A 10/29/2013   Procedure: INTRAOPERATIVE TRANSESOPHAGEAL ECHOCARDIOGRAM;  Surgeon: Rexene Alberts, MD;  Location: [place];  Service: Open Heart Surgery;  Laterality: N/A;   LEFT AND RIGHT HEART CATHETERIZATION WITH CORONARY ANGIOGRAM N/A 06/27/2013   Procedure: LEFT AND RIGHT HEART CATHETERIZATION WITH CORONARY ANGIOGRAM;  Surgeon: Jettie Booze, MD;  Location: [facility] CATH LAB;  Service: Cardiovascular;  Laterality: N/A;   MEDIAL PARTIAL KNEE REPLACEMENT Left 1990's?   MINIMALLY INVASIVE MAZE PROCEDURE N/A 10/29/2013   Procedure: MINIMALLY INVASIVE MAZE PROCEDURE;  Surgeon: Rexene Alberts, MD;  Location:  [place] [facility];  Service: Open Heart Surgery;  Laterality: N/A;   TEE WITHOUT CARDIOVERSION N/A 06/30/2013   Procedure: TRANSESOPHAGEAL ECHOCARDIOGRAM (TEE) ;  Surgeon: [name], MD;  Location: [facility] ENDOSCOPY;  Service: Cardiovascular;  Laterality: N/A;   TRANSURETHRAL RESECTION OF PROSTATE  2010    Family History  Problem Relation Age of Onset   Healthy Father    Healthy Mother    Healthy Sister     Social History   Socioeconomic History    Marital status: Widowed    Spouse name: Not on file   Number of children: Not on file   Years of education: Not on file   Highest education level: Not on file  Occupational History   Not on file  Tobacco Use   Smoking status: Never   Smokeless tobacco: Never   Tobacco comments:    06/25/2013 "smoked very light; years and years ago"  Vaping Use   Vaping Use: Never used  Substance and Sexual Activity   Alcohol use: Yes    Alcohol/week: 14.0 standard drinks    Types: 7 Glasses of wine, 7 Shots of liquor per week   Drug use: No   Sexual activity: Not Currently  Other Topics Concern   Not on file  Social History Narrative   [place]. He is widowed. Wife passed of breast cancer last year. No children.    Social Determinants of Health   Financial Resource Strain: Not on file  Food Insecurity: Not on file  Transportation Needs: Not on file  Physical Activity: Not on file  Stress: Not on file  Social Connections: Not on file  Intimate Partner Violence: Not on file     PHYSICAL EXAM:  VS: BP (!) 104/56 (BP Location: Left Arm, Patient Position: Sitting, Cuff Size: Large)   Ht '6\' 2"'$  (1.88 m)   Wt 260 lb (117.9 kg)   BMI 33.38 kg/m  Physical Exam Gen: NAD, alert, cooperative with exam, well-appearing      ASSESSMENT & PLAN:   OA (osteoarthritis) of knee Continues to get improvement.  Has pain with some weightbearing -Counseled on home exercise therapy and supportive care. -Refilled Norco.  Counseled on weaning off and taking every other day. -Referral to physical therapy.  Continues to get improvement. -Could consider injection at next visit.  If continues to have pain going forward may need to consider referral to surgery.

## 2020-09-27 NOTE — Assessment & Plan Note (Signed)
Continues to get improvement.  Has pain with some weightbearing -Counseled on home exercise therapy and supportive care. -Refilled Norco.  Counseled on weaning off and taking every other day. -Referral to physical therapy.  Continues to get improvement. -Could consider injection at next visit.  If continues to have pain going forward may need to consider referral to surgery.

## 2020-09-27 NOTE — Patient Instructions (Signed)
Good to see you I have sent another referral to physical therapy  Please try to take the norco every other day.   Please send me a message in MyChart with any questions or updates.  Please see me back in 4 weeks.   --Dr. Raeford Razor

## 2020-09-27 NOTE — Therapy (Addendum)
[facility] [address]  [place], [place], [address] Phone: [phone]   Fax:  [phone]  Progress Note Reporting Period 07/29/2020 to 09/27/2020  See note below for Objective Data and Assessment of Progress/Goals.     Physical Therapy Treatment/ Progress Note  Patient Details  Name: Jose Snyder MRN: [id number] Date of Birth: September 24, 1940 Referring Provider (PT): Clearance Coots, MD   Encounter Date: 09/27/2020      PT End of Session - 09/27/20 1031       Visit Number 25    Number of Visits 27    Date for PT Re-Evaluation 10/04/20    Authorization Type Medicare & BCBS    PT Start Time 0932    PT Stop Time 1017    PT Time Calculation (min) 45 min    Activity Tolerance Patient tolerated treatment well;Patient limited by fatigue    Behavior During Therapy [facility] for tasks assessed/performed        Past Medical History:  Diagnosis Date   Anxiety    Arthritis    R hand- OA   Atrial Fibrillation  06/25/2013   CAD (coronary artery disease), native coronary artery 06/28/2013   a. LHC (06/27/13):  Mid LAD 40%, mid D1 75%, mid CFX 50-60%, mid RCA 50%, EF 20%, LVEDP 24 mmHg.   CHF (congestive heart failure) (HCC)    Chronic combined systolic and diastolic heart failure ([place]) 06/26/2013   a.  Echo (06/25/13):  EF 20-25%, diff HK, restrictive physio, mild MR, mod to severe LAE, mild RVE, mildly reduced RVSF, mod to severe RAE   Chronic kidney disease    Dyslipidemia 07/15/2013   Dysrhythmia    a-fib   GERD (gastroesophageal reflux disease)    Hypertension    Incidental pulmonary nodule 06/22/2014   Seen on CT angiogram of chest   NICM (nonischemic cardiomyopathy) with EF 20-25% 06/26/2013   Obesity    Penetrating atherosclerotic ulcer of aorta ([place]) 10/22/2013   Small penetrating ulcer of descending thoracic aorta discovered on routine CTA   Prostate cancer ([place])    watchful waiting, no treatment so far   S/P Maze operation  for atrial fibrillation 10/29/2013   Complete bilateral atrial lesion set using cryothermy and bipolar radiofrequency ablation with clipping of LA appendage via right mini thoracotomy   Shortness of breath    seen by Stanford Breed, & low energy     Past Surgical History:  Procedure Laterality Date   CARDIAC CATHETERIZATION  06/2013   CARDIOVERSION N/A 06/30/2013   Procedure: CARDIOVERSION;  Surgeon: [name], MD;  Location: [place];  Service: Cardiovascular;  Laterality: N/A;   CARDIOVERSION N/A 07/31/2013   Procedure: CARDIOVERSION;  Surgeon: Thayer Headings, MD;  Location: [place];  Service: Cardiovascular;  Laterality: N/A;   CARDIOVERSION N/A 03/19/2018   Procedure: CARDIOVERSION;  Surgeon: [name], MD;  Location: [place];  Service: Cardiovascular;  Laterality: N/A;   CARDIOVERSION N/A 07/24/2018   Procedure: CARDIOVERSION;  Surgeon: [name], MD;  Location: [facility] ENDOSCOPY;  Service: Cardiovascular;  Laterality: N/A;   CLIPPING OF ATRIAL APPENDAGE  10/29/2013   Procedure: CLIPPING OF ATRIAL APPENDAGE;  Surgeon: Rexene Alberts, MD;  Location: [place];  Service: Open Heart Surgery;;   INTRAOPERATIVE TRANSESOPHAGEAL ECHOCARDIOGRAM N/A 10/29/2013   Procedure: INTRAOPERATIVE TRANSESOPHAGEAL ECHOCARDIOGRAM;  Surgeon: Rexene Alberts, MD;  Location: [place];  Service: Open Heart Surgery;  Laterality: N/A;   LEFT AND RIGHT HEART CATHETERIZATION  WITH CORONARY ANGIOGRAM N/A 06/27/2013   Procedure: LEFT AND RIGHT HEART CATHETERIZATION WITH CORONARY ANGIOGRAM;  Surgeon: Jettie Booze, MD;  Location: [facility] CATH LAB;  Service: Cardiovascular;  Laterality: N/A;   MEDIAL PARTIAL KNEE REPLACEMENT Left 1990's?   MINIMALLY INVASIVE MAZE PROCEDURE N/A 10/29/2013   Procedure: MINIMALLY INVASIVE MAZE PROCEDURE;  Surgeon: Rexene Alberts, MD;  Location: [place];  Service: Open Heart Surgery;  Laterality: N/A;   TEE WITHOUT CARDIOVERSION N/A 06/30/2013   Procedure: TRANSESOPHAGEAL ECHOCARDIOGRAM (TEE)  ;  Surgeon: [name], MD;  Location: [place];  Service: Cardiovascular;  Laterality: N/A;   TRANSURETHRAL RESECTION OF PROSTATE  2010    There were no vitals filed for this visit.    Subjective Assessment - 09/27/20 0937       Subjective Was on feet a lot this weekend, still gets SOB when walking to mailbox.    Pertinent History prostate CA with resection 2010, HTN, GERD, HLD, CKD, chronic systolic/diastolic HF, CHF, CAD, a-fib, anxiety, L medial partial knee arthroplasty 1990's    Diagnostic tests 06/19/20 R knee MRI: Radial tear involving the posterior horn of the medial meniscus, probably remote large osteochondral lesion of medial femoral condyle    Patient Stated Goals decrease pain and increase strength                         [facility] PT Assessment - 09/27/20 0001                Observation/Other Assessments    Focus on Therapeutic Outcomes (FOTO) Knee: 47, Predicted score: 62         Ambulation/Gait    Ambulation/Gait Yes    Ambulation Distance (Feet) 760 Feet    Assistive device --   rollator         6 Minute Walk- Baseline    6 Minute Walk- Baseline yes    HR (bpm) 63    02 Sat (%RA) 97 %    Modified Borg Scale for Dyspnea 0.5- Very, very slight shortness of breath         6 Minute walk- Post Test    6 Minute Walk Post Test yes    HR (bpm) 73    02 Sat (%RA) 96 %    Modified Borg Scale for Dyspnea 3- Moderate shortness of breath or breathing difficulty         6 minute walk test results    Aerobic Endurance Distance Walked 760    Endurance additional comments able to ambulate 4 min w/o requiring rest break                                               [facility] Adult PT Treatment/Exercise - 09/27/20 0001                Knee/Hip Exercises: Stretches    Active Hamstring Stretch Right;3 reps;30 seconds    Active Hamstring Stretch Limitations with strap         Knee/Hip Exercises: Aerobic    Recumbent Bike L3x56min                         Plan - 09/27/20 1032       Clinical Impression Statement Pt made improvement with 6 min walk test in distance,  vitals were kept WNL but he remains limited mostly by SOB. He noted that he still gets SOB when walking to his mailbox. He is making progress with balance during shower transfers, noting that he still holds onto grab bars but the movements are smoother. He noted that his MD wants to continue with PT for 4 more weeks and hold off on receiving an injection for his knee. BERG balance assessment was deferred due to exertion during 6 min walk test. His knee pain has improved but occasionally the pain gets worse and limits his standing tolrance. Pt would continue to benefit from PT to address remaining deficits with cardiovascular endurance and balance to improve his overall function.    Personal Factors and Comorbidities Age;Comorbidity 3+;Fitness;Past/Current Experience;Time since onset of injury/illness/exacerbation    Comorbidities prostate CA with resection 2010, HTN, GERD, HLD, CKD, chronic systolic/diastolic HF, CHF, CAD, a-fib, anxiety, L medial partial knee arthroplasty 1990's    PT Frequency 2x / week    PT Duration 6 weeks - extended 4 weeks    PT Treatment/Interventions ADLs/Self Care Home Management;Cryotherapy;Electrical Stimulation;Iontophoresis 4mg /ml Dexamethasone;Moist Heat;Balance training;Therapeutic exercise;Therapeutic activities;Functional mobility training;Stair training;Gait training;Ultrasound;Neuromuscular re-education;Patient/family education;Manual techniques;Vestibular;Vasopneumatic Device;Taping;Energy conservation;Dry needling;Passive range of motion    PT Next Visit Plan continue to progress hip strengthening as tolerated; Balance training working on SLS and narrow BOS    PT Home Exercise Plan Access Code: [email or web address]    Consulted and Agree with Plan of Care Patient                      [facility] PT Assessment - 09/29/20 0001       Assessment    Medical Diagnosis Insufficiency fx of medial femoral condyle    Referring Provider (PT) Clearance Coots, MD    Onset Date/Surgical Date 07/21/20   referrel date                   PT Short Term Goals - 08/23/20 1103       PT SHORT TERM GOAL #1   Title Patient to be independent with initial HEP.    Status Achieved               PT Long Term Goals - 09/27/20 1011       PT LONG TERM GOAL #1   Title Patient to be independent with advanced HEP.    Time 10    Status Partially Met   met for current   Target Date 10/25/20      PT LONG TERM GOAL #2   Title Patient to demonstrate B LE strength >/=4+/5.    Status Achieved      PT LONG TERM GOAL #3   Title Patient to demonstrate R knee AROM/PROM 0-120 degrees.    Status Achieved      PT LONG TERM GOAL #4   Title Patient to score >/=48/56 on Berg in order to decrease risk of falls.    Time 10    Status Partially Met   08/23/20: 46/56   Target Date 10/25/20      PT LONG TERM GOAL #5   Title Patient to report 85% improvement in balance confidence with shower transfers.    Time 10    Status Partially Met   reports 60% improvement in balance with shower transfers   Target Date 10/25/20      PT LONG TERM GOAL #6   Title Patient to ambulate atleast 1261ft with 6 minute walk test with  6OQ in order to improve functional activity tolerance and endurance.    Time 10    Status On-going   ambulated 760 ft during 6 min walk test   Target Date 10/25/20               Patient will benefit from skilled therapeutic intervention in order to improve the following deficits and impairments:  Abnormal gait, Decreased endurance, Increased edema, Pain, Increased fascial restricitons, Decreased strength, Decreased activity tolerance, Decreased balance, Difficulty walking, Improper body mechanics, Decreased range of motion, Impaired flexibility, Postural dysfunction  Visit Diagnosis: Chronic pain of right knee  Muscle weakness  (generalized)  Unsteadiness on feet  Other symptoms and signs involving the musculoskeletal system  Other abnormalities of gait and mobility     Problem List Patient Active Problem List   Diagnosis Date Noted   Pain of knee joint with osteochondral injury 07/21/2020   Carcinoma of prostate ([place]) 06/30/2020   Congestive heart failure ([place]) 06/30/2020   Other psoriasis 06/30/2020   Other specified counseling 06/30/2020   Subchondral bone cyst 06/09/2020   OA (osteoarthritis) of knee 01/08/2020   Atypical atrial flutter (HCC)    Incidental pulmonary nodule 06/22/2014   Chronic kidney disease    S/P Maze operation for atrial fibrillation 10/29/2013   Penetrating atherosclerotic ulcer of aorta ([place]) 10/22/2013   Chronic atrial fibrillation ([place]) 09/24/2013   Dyslipidemia 07/15/2013   CAD (coronary artery disease), native coronary artery 06/28/2013   Dyspnea on exertion 06/26/2013   History of prostate cancer 06/26/2013   Chronic combined systolic and diastolic heart failure ([place]) 06/26/2013   NICM (nonischemic cardiomyopathy) with EF 20-25% 06/26/2013    Class: Diagnosis of   Atrial Fibrillation  06/25/2013   HTN (hypertension) 06/25/2013    Artist Pais, PTA 09/27/2020, 11:17 AM  [facility] [address]  [place], [place], [address] Phone: [phone]   Fax:  [phone]  Name: Jose Snyder MRN: [id number] Date of Birth: 1940/10/12   Patient is making good progress towards goals, has met 2/5 longer term goals and partially met remaining goals.  His Merrilee Jansky has improved to 46/56 indicating decreased risk of falls and he reports improved confidence at home with transfers in and out of showers.  His FOTO score is 47, with predicted score of 62.  He continues to demonstrate decreased endurance and dyspnea on exertion.  He would benefit from continued skilled physical therapy for an additional 4 weeks in order to  reach maximum improvement, decrease risk of falls, and improve strength and endurance.    Rennie Natter PT, DPT

## 2020-09-29 NOTE — Addendum Note (Signed)
Addended by: Rennie Natter on: 09/29/2020 08:37 AM   Modules accepted: Orders

## 2020-09-30 ENCOUNTER — Other Ambulatory Visit: Payer: Self-pay

## 2020-09-30 ENCOUNTER — Ambulatory Visit: Payer: Medicare Other | Admitting: Physical Therapy

## 2020-09-30 ENCOUNTER — Encounter: Payer: Self-pay | Admitting: Physical Therapy

## 2020-09-30 DIAGNOSIS — R2681 Unsteadiness on feet: Secondary | ICD-10-CM

## 2020-09-30 DIAGNOSIS — R29898 Other symptoms and signs involving the musculoskeletal system: Secondary | ICD-10-CM | POA: Diagnosis not present

## 2020-09-30 DIAGNOSIS — R2689 Other abnormalities of gait and mobility: Secondary | ICD-10-CM

## 2020-09-30 DIAGNOSIS — M25561 Pain in right knee: Secondary | ICD-10-CM | POA: Diagnosis not present

## 2020-09-30 DIAGNOSIS — M6281 Muscle weakness (generalized): Secondary | ICD-10-CM | POA: Diagnosis not present

## 2020-09-30 DIAGNOSIS — G8929 Other chronic pain: Secondary | ICD-10-CM | POA: Diagnosis not present

## 2020-09-30 NOTE — Therapy (Signed)
[facility] [address]  [place], [place], [address] Phone: [phone]   Fax:  [phone]  Physical Therapy Treatment  Patient Details  Name: Jose Snyder MRN: [id number] Date of Birth: 1940-03-19 Referring Provider (PT): Clearance Coots, MD   Encounter Date: 09/30/2020   PT End of Session - 09/30/20 1155     Visit Number 26    Number of Visits 34    Date for PT Re-Evaluation 10/25/20    Authorization Type Medicare & BCBS    Progress Note Due on Visit 61    PT Start Time 1100    PT Stop Time 1145    PT Time Calculation (min) 45 min    Activity Tolerance Patient tolerated treatment well;Patient limited by fatigue    Behavior During Therapy [facility] for tasks assessed/performed             Past Medical History:  Diagnosis Date   Anxiety    Arthritis    R hand- OA   Atrial Fibrillation  06/25/2013   CAD (coronary artery disease), native coronary artery 06/28/2013   a. LHC (06/27/13):  Mid LAD 40%, mid D1 75%, mid CFX 50-60%, mid RCA 50%, EF 20%, LVEDP 24 mmHg.   CHF (congestive heart failure) (HCC)    Chronic combined systolic and diastolic heart failure ([place]) 06/26/2013   a.  Echo (06/25/13):  EF 20-25%, diff HK, restrictive physio, mild MR, mod to severe LAE, mild RVE, mildly reduced RVSF, mod to severe RAE   Chronic kidney disease    Dyslipidemia 07/15/2013   Dysrhythmia    a-fib   GERD (gastroesophageal reflux disease)    Hypertension    Incidental pulmonary nodule 06/22/2014   Seen on CT angiogram of chest   NICM (nonischemic cardiomyopathy) with EF 20-25% 06/26/2013   Obesity    Penetrating atherosclerotic ulcer of aorta ([place]) 10/22/2013   Small penetrating ulcer of descending thoracic aorta discovered on routine CTA   Prostate cancer ([place])    watchful waiting, no treatment so far   S/P Maze operation for atrial fibrillation 10/29/2013   Complete bilateral atrial lesion set using cryothermy and bipolar  radiofrequency ablation with clipping of LA appendage via right mini thoracotomy   Shortness of breath    seen by Stanford Breed, & low energy     Past Surgical History:  Procedure Laterality Date   CARDIAC CATHETERIZATION  06/2013   CARDIOVERSION N/A 06/30/2013   Procedure: CARDIOVERSION;  Surgeon: [name], MD;  Location: [place];  Service: Cardiovascular;  Laterality: N/A;   CARDIOVERSION N/A 07/31/2013   Procedure: CARDIOVERSION;  Surgeon: Thayer Headings, MD;  Location: [place];  Service: Cardiovascular;  Laterality: N/A;   CARDIOVERSION N/A 03/19/2018   Procedure: CARDIOVERSION;  Surgeon: [name], MD;  Location: [place];  Service: Cardiovascular;  Laterality: N/A;   CARDIOVERSION N/A 07/24/2018   Procedure: CARDIOVERSION;  Surgeon: [name], MD;  Location: [facility] ENDOSCOPY;  Service: Cardiovascular;  Laterality: N/A;   CLIPPING OF ATRIAL APPENDAGE  10/29/2013   Procedure: CLIPPING OF ATRIAL APPENDAGE;  Surgeon: Rexene Alberts, MD;  Location: [place];  Service: Open Heart Surgery;;   INTRAOPERATIVE TRANSESOPHAGEAL ECHOCARDIOGRAM N/A 10/29/2013   Procedure: INTRAOPERATIVE TRANSESOPHAGEAL ECHOCARDIOGRAM;  Surgeon: Rexene Alberts, MD;  Location: [place];  Service: Open Heart Surgery;  Laterality: N/A;   LEFT AND RIGHT HEART CATHETERIZATION WITH CORONARY ANGIOGRAM N/A 06/27/2013   Procedure: LEFT AND RIGHT HEART CATHETERIZATION WITH CORONARY  ANGIOGRAM;  Surgeon: Jettie Booze, MD;  Location: [facility] CATH LAB;  Service: Cardiovascular;  Laterality: N/A;   MEDIAL PARTIAL KNEE REPLACEMENT Left 1990's?   MINIMALLY INVASIVE MAZE PROCEDURE N/A 10/29/2013   Procedure: MINIMALLY INVASIVE MAZE PROCEDURE;  Surgeon: Rexene Alberts, MD;  Location: [place];  Service: Open Heart Surgery;  Laterality: N/A;   TEE WITHOUT CARDIOVERSION N/A 06/30/2013   Procedure: TRANSESOPHAGEAL ECHOCARDIOGRAM (TEE) ;  Surgeon: [name], MD;  Location: [place];  Service: Cardiovascular;  Laterality: N/A;    TRANSURETHRAL RESECTION OF PROSTATE  2010    There were no vitals filed for this visit.   Subjective Assessment - 09/30/20 1107     Subjective Patient reported he was very sore after last session the following day, hasn't done much since then at home.  Asking about kettle bell workouts.    Pertinent History prostate CA with resection 2010, HTN, GERD, HLD, CKD, chronic systolic/diastolic HF, CHF, CAD, a-fib, anxiety, L medial partial knee arthroplasty 1990's    Diagnostic tests 06/19/20 R knee MRI: Radial tear involving the posterior horn of the medial meniscus, probably remote large osteochondral lesion of medial femoral condyle    Patient Stated Goals decrease pain and increase strength    Currently in Pain? Yes    Pain Score 5     Pain Location Knee    Pain Orientation Right    Pain Descriptors / Indicators Aching                               [facility] Adult PT Treatment/Exercise - 09/30/20 0001       Ambulation/Gait   Ambulation/Gait Yes    Ambulation/Gait Assistance 5: Supervision    Ambulation Distance (Feet) 620 Feet   2 x 310 feet with seated rest break between   Assistive device 4-wheeled walker      Exercises   Exercises Knee/Hip;Other Exercises    Other Exercises  seated exercises for trunk expansion and breathing, also seated exercises with 5# weight for trunk/lumbar strengthening and engaging LE.      Knee/Hip Exercises: Aerobic   Nustep L4x12mn (UEs/LEs)      Knee/Hip Exercises: Seated   Long Arc Quad Strengthening;Both;5 sFirefighterStrengthening;Both;5 sets                      PT Short Term Goals - 08/23/20 1103       PT SHORT TERM GOAL #1   Title Patient to be independent with initial HEP.    Status Achieved               PT Long Term Goals - 09/27/20 1011       PT LONG TERM GOAL #1   Title Patient to be independent with advanced HEP.    Time 10    Status Partially Met   met for current   Target Date  10/25/20      PT LONG TERM GOAL #2   Title Patient to demonstrate B LE strength >/=4+/5.    Status Achieved      PT LONG TERM GOAL #3   Title Patient to demonstrate R knee AROM/PROM 0-120 degrees.    Status Achieved      PT LONG TERM GOAL #4   Title Patient to score >/=48/56 on Berg in order to decrease risk of falls.    Time 10    Status  Partially Met   08/23/20: 46/56   Target Date 10/25/20      PT LONG TERM GOAL #5   Title Patient to report 85% improvement in balance confidence with shower transfers.    Time 10    Status Partially Met   reports 60% improvement in balance with shower transfers   Target Date 10/25/20      PT LONG TERM GOAL #6   Title Patient to ambulate atleast 127f with 6 minute walk test with 4WW in order to improve functional activity tolerance and endurance.    Time 10    Status On-going   ambulated 760 ft during 6 min walk test   Target Date 10/25/20                   Plan - 09/30/20 1155     Clinical Impression Statement Pt. reported increased soreness after last session, but was still willing to give full effort today.  He is planning on returning in 4 weeks to orthopedist for knee injection, but is considering R knee replacement now due to pain.  He was able to walk 2 x 310 feet with cues for breathing.  He has kettle bell at home, and interested in k[place]landwork out, so reviewed some seated exercises due to balance concerns.  He would benefit from continued skilled therapy.    Personal Factors and Comorbidities Age;Comorbidity 3+;Fitness;Past/Current Experience;Time since onset of injury/illness/exacerbation    Comorbidities prostate CA with resection 2010, HTN, GERD, HLD, CKD, chronic systolic/diastolic HF, CHF, CAD, a-fib, anxiety, L medial partial knee arthroplasty 1990's    PT Frequency 2x / week    PT Duration 6 weeks    PT Treatment/Interventions ADLs/Self Care Home Management;Cryotherapy;Electrical Stimulation;Iontophoresis 46mml  Dexamethasone;Moist Heat;Balance training;Therapeutic exercise;Therapeutic activities;Functional mobility training;Stair training;Gait training;Ultrasound;Neuromuscular re-education;Patient/family education;Manual techniques;Vestibular;Vasopneumatic Device;Taping;Energy conservation;Dry needling;Passive range of motion    PT Next Visit Plan continue to progress hip strengthening as tolerated; Balance training working on SLS and narrow BOS    PT Home Exercise Plan Access Code: [email or web address]    Consulted and Agree with Plan of Care Patient             Patient will benefit from skilled therapeutic intervention in order to improve the following deficits and impairments:  Abnormal gait, Decreased endurance, Increased edema, Pain, Increased fascial restricitons, Decreased strength, Decreased activity tolerance, Decreased balance, Difficulty walking, Improper body mechanics, Decreased range of motion, Impaired flexibility, Postural dysfunction  Visit Diagnosis: Chronic pain of right knee  Muscle weakness (generalized)  Unsteadiness on feet  Other symptoms and signs involving the musculoskeletal system  Other abnormalities of gait and mobility     Problem List Patient Active Problem List   Diagnosis Date Noted   Pain of knee joint with osteochondral injury 07/21/2020   Carcinoma of prostate ([facility]05/05/2020   Congestive heart failure ([place]CCairo05/05/2020   Other psoriasis 06/30/2020   Other specified counseling 06/30/2020   Subchondral bone cyst 06/09/2020   OA (osteoarthritis) of knee 01/08/2020   Atypical atrial flutter (HCC)    Incidental pulmonary nodule 06/22/2014   Chronic kidney disease    S/P Maze operation for atrial fibrillation 10/29/2013   Penetrating atherosclerotic ulcer of aorta (HCL[place]08/26/2015   Chronic atrial fibrillation ([place]07/29/2015   Dyslipidemia 07/15/2013   CAD (coronary artery disease), native coronary artery 06/28/2013   Dyspnea on exertion 06/26/2013    History of prostate cancer 06/26/2013   Chronic combined systolic and diastolic heart failure (HCBlairsburg04/30/2015  NICM (nonischemic cardiomyopathy) with EF 20-25% 06/26/2013    Class: Diagnosis of   Atrial Fibrillation  06/25/2013   HTN (hypertension) 06/25/2013    Rennie Natter PT, DPT 09/30/2020, 12:04 PM  [facility] [address]  [place], [place], [address] Phone: [phone]   Fax:  [phone]  Name: Jose Snyder MRN: [id number] Date of Birth: December 14, 1940

## 2020-10-04 ENCOUNTER — Other Ambulatory Visit: Payer: Self-pay

## 2020-10-04 ENCOUNTER — Ambulatory Visit (INDEPENDENT_AMBULATORY_CARE_PROVIDER_SITE_OTHER): Payer: Medicare Other | Admitting: Podiatry

## 2020-10-04 ENCOUNTER — Encounter: Payer: Self-pay | Admitting: Podiatry

## 2020-10-04 DIAGNOSIS — B351 Tinea unguium: Secondary | ICD-10-CM | POA: Diagnosis not present

## 2020-10-04 DIAGNOSIS — M79676 Pain in unspecified toe(s): Secondary | ICD-10-CM

## 2020-10-04 DIAGNOSIS — D689 Coagulation defect, unspecified: Secondary | ICD-10-CM | POA: Diagnosis not present

## 2020-10-04 NOTE — Progress Notes (Signed)
This patient returns to my office for at risk foot care.  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  This patient is unable to cut nails himself since the patient cannot reach his nails.These nails are painful walking and wearing shoes.  This patient presents for at risk foot care today.  General Appearance  Alert, conversant and in no acute stress.  Vascular  Dorsalis pedis and posterior tibial  pulses are palpable  bilaterally.  Capillary return is within normal limits  bilaterally. Temperature is within normal limits  bilaterally.  Neurologic  Senn-Weinstein monofilament wire test within normal limits  bilaterally. Muscle power within normal limits bilaterally.  Nails Thick disfigured discolored nails with subungual debris  from hallux to fifth toes bilaterally. No evidence of bacterial infection or drainage bilaterally.  Orthopedic  No limitations of motion  feet .  No crepitus or effusions noted.  No bony pathology or digital deformities noted.  Skin  normotropic skin with no porokeratosis noted bilaterally.  No signs of infections or ulcers noted.     Onychomycosis  Pain in right toes  Pain in left toes  Consent was obtained for treatment procedures.   Mechanical debridement of nails 1-5  bilaterally performed with a nail nipper.  Filed with dremel without incident.  Self avulsed   3   toenail right foot with no evidence of infection.   Return office visit   3 months                   Told patient to return for periodic foot care and evaluation due to potential at risk complications.   [name] DPM

## 2020-10-05 ENCOUNTER — Encounter: Payer: Self-pay | Admitting: Physical Therapy

## 2020-10-05 ENCOUNTER — Ambulatory Visit: Payer: Medicare Other | Admitting: Physical Therapy

## 2020-10-05 DIAGNOSIS — G8929 Other chronic pain: Secondary | ICD-10-CM | POA: Diagnosis not present

## 2020-10-05 DIAGNOSIS — R2681 Unsteadiness on feet: Secondary | ICD-10-CM

## 2020-10-05 DIAGNOSIS — R29898 Other symptoms and signs involving the musculoskeletal system: Secondary | ICD-10-CM | POA: Diagnosis not present

## 2020-10-05 DIAGNOSIS — R2689 Other abnormalities of gait and mobility: Secondary | ICD-10-CM | POA: Diagnosis not present

## 2020-10-05 DIAGNOSIS — M6281 Muscle weakness (generalized): Secondary | ICD-10-CM

## 2020-10-05 DIAGNOSIS — M25561 Pain in right knee: Secondary | ICD-10-CM | POA: Diagnosis not present

## 2020-10-05 NOTE — Therapy (Signed)
[facility] [place] [address]  [place], [place], [address] Phone: [phone]   Fax:  [phone]  Physical Therapy Treatment  Patient Details  Name: Jose Snyder MRN: [id number] Date of Birth: 1940-10-29 Referring Provider (PT): Clearance Coots, MD   Encounter Date: 10/05/2020   PT End of Session - 10/05/20 1406     Visit Number 27    Number of Visits 34    Date for PT Re-Evaluation 10/25/20    Authorization Type Medicare & BCBS    Progress Note Due on Visit 99    PT Start Time 0201    PT Stop Time 0244    PT Time Calculation (min) 43 min    Activity Tolerance Patient tolerated treatment well    Behavior During Therapy [facility] for tasks assessed/performed             Past Medical History:  Diagnosis Date   Anxiety    Arthritis    R hand- OA   Atrial Fibrillation  06/25/2013   CAD (coronary artery disease), native coronary artery 06/28/2013   a. LHC (06/27/13):  Mid LAD 40%, mid D1 75%, mid CFX 50-60%, mid RCA 50%, EF 20%, LVEDP 24 mmHg.   CHF (congestive heart failure) (HCC)    Chronic combined systolic and diastolic heart failure ([place]) 06/26/2013   a.  Echo (06/25/13):  EF 20-25%, diff HK, restrictive physio, mild MR, mod to severe LAE, mild RVE, mildly reduced RVSF, mod to severe RAE   Chronic kidney disease    Dyslipidemia 07/15/2013   Dysrhythmia    a-fib   GERD (gastroesophageal reflux disease)    Hypertension    Incidental pulmonary nodule 06/22/2014   Seen on CT angiogram of chest   NICM (nonischemic cardiomyopathy) with EF 20-25% 06/26/2013   Obesity    Penetrating atherosclerotic ulcer of aorta ([place]) 10/22/2013   Small penetrating ulcer of descending thoracic aorta discovered on routine CTA   Prostate cancer ([place])    watchful waiting, no treatment so far   S/P Maze operation for atrial fibrillation 10/29/2013   Complete bilateral atrial lesion set using cryothermy and bipolar radiofrequency ablation with  clipping of LA appendage via right mini thoracotomy   Shortness of breath    seen by Stanford Breed, & low energy     Past Surgical History:  Procedure Laterality Date   CARDIAC CATHETERIZATION  06/2013   CARDIOVERSION N/A 06/30/2013   Procedure: CARDIOVERSION;  Surgeon: [name], MD;  Location: [place];  Service: Cardiovascular;  Laterality: N/A;   CARDIOVERSION N/A 07/31/2013   Procedure: CARDIOVERSION;  Surgeon: Thayer Headings, MD;  Location: [place];  Service: Cardiovascular;  Laterality: N/A;   CARDIOVERSION N/A 03/19/2018   Procedure: CARDIOVERSION;  Surgeon: [name], MD;  Location: [place];  Service: Cardiovascular;  Laterality: N/A;   CARDIOVERSION N/A 07/24/2018   Procedure: CARDIOVERSION;  Surgeon: [name], MD;  Location: [facility] ENDOSCOPY;  Service: Cardiovascular;  Laterality: N/A;   CLIPPING OF ATRIAL APPENDAGE  10/29/2013   Procedure: CLIPPING OF ATRIAL APPENDAGE;  Surgeon: Rexene Alberts, MD;  Location: [place];  Service: Open Heart Surgery;;   INTRAOPERATIVE TRANSESOPHAGEAL ECHOCARDIOGRAM N/A 10/29/2013   Procedure: INTRAOPERATIVE TRANSESOPHAGEAL ECHOCARDIOGRAM;  Surgeon: Rexene Alberts, MD;  Location: [place];  Service: Open Heart Surgery;  Laterality: N/A;   LEFT AND RIGHT HEART CATHETERIZATION WITH CORONARY ANGIOGRAM N/A 06/27/2013   Procedure: LEFT AND RIGHT HEART CATHETERIZATION WITH CORONARY ANGIOGRAM;  Surgeon:  Jettie Booze, MD;  Location: [facility] CATH LAB;  Service: Cardiovascular;  Laterality: N/A;   MEDIAL PARTIAL KNEE REPLACEMENT Left 1990's?   MINIMALLY INVASIVE MAZE PROCEDURE N/A 10/29/2013   Procedure: MINIMALLY INVASIVE MAZE PROCEDURE;  Surgeon: Rexene Alberts, MD;  Location: [place];  Service: Open Heart Surgery;  Laterality: N/A;   TEE WITHOUT CARDIOVERSION N/A 06/30/2013   Procedure: TRANSESOPHAGEAL ECHOCARDIOGRAM (TEE) ;  Surgeon: [name], MD;  Location: [place];  Service: Cardiovascular;  Laterality: N/A;   TRANSURETHRAL RESECTION OF  PROSTATE  2010    There were no vitals filed for this visit.   Subjective Assessment - 10/05/20 1405     Subjective Patient again reported he was very sore after last session, mostly stayed off feet the following day.  Also bothered by hot weather.    Pertinent History prostate CA with resection 2010, HTN, GERD, HLD, CKD, chronic systolic/diastolic HF, CHF, CAD, a-fib, anxiety, L medial partial knee arthroplasty 1990's    Limitations House hold activities;Walking;Standing;Lifting;Sitting    How long can you stand comfortably? 5 min    How long can you walk comfortably? 5 min    Diagnostic tests 06/19/20 R knee MRI: Radial tear involving the posterior horn of the medial meniscus, probably remote large osteochondral lesion of medial femoral condyle    Patient Stated Goals decrease pain and increase strength    Currently in Pain? Yes    Pain Score 5     Pain Location Knee    Pain Orientation Right                [facility] Adult PT Treatment/Exercise - 10/05/20 0001       Exercises   Exercises Knee/Hip;Other Exercises      Knee/Hip Exercises: Stretches   Active Hamstring Stretch Right;3 reps;30 seconds;Left    Active Hamstring Stretch Limitations seated with gentle overpressure on thigh      Knee/Hip Exercises: Aerobic   Nustep L4x5mn (UEs/LEs)      Knee/Hip Exercises: Seated   Long Arc Quad Strengthening;Both;5 sets    LIllinois Tool WorksWeight 3 lbs.    Ball Squeeze 10x5"    Clamshell with TheraBand Blue    Knee/Hip Flexion x 20 4# ankle weights    Marching Strengthening;Both;20 reps    Marching Limitations 4# ankle weights    Hamstring Curl Strengthening;Both;2 sets;10 reps     Hamstring Weights 3 lbs.            Seated upper extremity exercises : deep breathing with arm movements to open chest 2 x 5; punches with 1# wrist weights x 20, "speed bag" x 20, forward trunk flexion with 8# weight x 20, side flexion and return 8# weights x 10 bil.     PT Short Term Goals -  08/23/20 1103       PT SHORT TERM GOAL #1   Title Patient to be independent with initial HEP.    Status Achieved               PT Long Term Goals - 09/27/20 1011       PT LONG TERM GOAL #1   Title Patient to be independent with advanced HEP.    Time 10    Status Partially Met   met for current   Target Date 10/25/20      PT LONG TERM GOAL #2   Title Patient to demonstrate B LE strength >/=4+/5.    Status Achieved  PT LONG TERM GOAL #3   Title Patient to demonstrate R knee AROM/PROM 0-120 degrees.    Status Achieved      PT LONG TERM GOAL #4   Title Patient to score >/=48/56 on Berg in order to decrease risk of falls.    Time 10    Status Partially Met   08/23/20: 46/56   Target Date 10/25/20      PT LONG TERM GOAL #5   Title Patient to report 85% improvement in balance confidence with shower transfers.    Time 10    Status Partially Met   reports 60% improvement in balance with shower transfers   Target Date 10/25/20      PT LONG TERM GOAL #6   Title Patient to ambulate atleast 1261f with 6 minute walk test with 4WW in order to improve functional activity tolerance and endurance.    Time 10    Status On-going   ambulated 760 ft during 6 min walk test   Target Date 10/25/20                   Plan - 10/05/20 1407     Clinical Impression Statement Pt. continues to report excessive fatigue after last few sessions, so this session focused on building endurance while alternating arm and leg exercises, cues throughout to engage core and not lean into chair.  He was fatigued at end of session but overall tolerated very well.  Discussed improtance of daily exercise and exercises like these today are a good way to build up his cardiovascular endurance.  Pt. would benefit from continued skilled therapy.    Personal Factors and Comorbidities Age;Comorbidity 3+;Fitness;Past/Current Experience;Time since onset of injury/illness/exacerbation    Comorbidities  prostate CA with resection 2010, HTN, GERD, HLD, CKD, chronic systolic/diastolic HF, CHF, CAD, a-fib, anxiety, L medial partial knee arthroplasty 1990's    PT Frequency 2x / week    PT Duration 6 weeks    PT Treatment/Interventions ADLs/Self Care Home Management;Cryotherapy;Electrical Stimulation;Iontophoresis 419mml Dexamethasone;Moist Heat;Balance training;Therapeutic exercise;Therapeutic activities;Functional mobility training;Stair training;Gait training;Ultrasound;Neuromuscular re-education;Patient/family education;Manual techniques;Vestibular;Vasopneumatic Device;Taping;Energy conservation;Dry needling;Passive range of motion    PT Next Visit Plan continue to progress hip strengthening as tolerated; Balance training working on SLS and narrow BOS    PT Home Exercise Plan Access Code: [email or web address]    Consulted and Agree with Plan of Care Patient             Patient will benefit from skilled therapeutic intervention in order to improve the following deficits and impairments:  Abnormal gait, Decreased endurance, Increased edema, Pain, Increased fascial restricitons, Decreased strength, Decreased activity tolerance, Decreased balance, Difficulty walking, Improper body mechanics, Decreased range of motion, Impaired flexibility, Postural dysfunction  Visit Diagnosis: Chronic pain of right knee  Muscle weakness (generalized)  Unsteadiness on feet  Other symptoms and signs involving the musculoskeletal system  Other abnormalities of gait and mobility     Problem List Patient Active Problem List   Diagnosis Date Noted   Pain of knee joint with osteochondral injury 07/21/2020   Carcinoma of prostate ([facility]05/05/2020   Congestive heart failure (HCHug[place]05/05/2020   Other psoriasis 06/30/2020   Other specified counseling 06/30/2020   Subchondral bone cyst 06/09/2020   OA (osteoarthritis) of knee 01/08/2020   Atypical atrial flutter (HCC)    Incidental pulmonary nodule 06/22/2014   Chronic  kidney disease    S/P Maze operation for atrial fibrillation 10/29/2013   Penetrating atherosclerotic ulcer of aorta ([facility]CDell[place]08/26/2015  Chronic atrial fibrillation (HCC) 09/24/2013   Dyslipidemia 07/15/2013   CAD (coronary artery disease), native coronary artery 06/28/2013   Dyspnea on exertion 06/26/2013   History of prostate cancer 06/26/2013   Chronic combined systolic and diastolic heart failure ([place]) 06/26/2013   NICM (nonischemic cardiomyopathy) with EF 20-25% 06/26/2013    Class: Diagnosis of   Atrial Fibrillation  06/25/2013   HTN (hypertension) 06/25/2013    Rennie Natter PT, DPT 10/05/2020, 2:59 PM  [facility] [address]  [place], [place], [address] Phone: [phone]   Fax:  [phone]  Name: Michael Walrath MRN: [id number] Date of Birth: [birth year]

## 2020-10-12 ENCOUNTER — Ambulatory Visit: Payer: Medicare Other

## 2020-10-12 ENCOUNTER — Other Ambulatory Visit: Payer: Self-pay

## 2020-10-12 DIAGNOSIS — G8929 Other chronic pain: Secondary | ICD-10-CM

## 2020-10-12 DIAGNOSIS — R29898 Other symptoms and signs involving the musculoskeletal system: Secondary | ICD-10-CM

## 2020-10-12 DIAGNOSIS — R2689 Other abnormalities of gait and mobility: Secondary | ICD-10-CM | POA: Diagnosis not present

## 2020-10-12 DIAGNOSIS — R2681 Unsteadiness on feet: Secondary | ICD-10-CM | POA: Diagnosis not present

## 2020-10-12 DIAGNOSIS — M25561 Pain in right knee: Secondary | ICD-10-CM | POA: Diagnosis not present

## 2020-10-12 DIAGNOSIS — M6281 Muscle weakness (generalized): Secondary | ICD-10-CM

## 2020-10-12 NOTE — Therapy (Signed)
[facility] [place] [facility] [address]  [address] [place], [place], [address] Phone: [phone]   Fax:  [phone]  Physical Therapy Treatment  Patient Details  Name: Jose Snyder MRN: [id number] Date of Birth: 12/31/1940 Referring Provider (PT): Jeremy Schmitz, MD   Encounter Date: 10/12/2020   PT End of Session - 10/12/20 1357     Visit Number 28    Number of Visits 34    Date for PT Re-Evaluation 10/25/20    Authorization Type Medicare & BCBS    Progress Note Due on Visit 35    PT Start Time 1315    PT Stop Time 1355    PT Time Calculation (min) 40 min    Activity Tolerance Patient tolerated treatment well    Behavior During Therapy WFL for tasks assessed/performed             Past Medical History:  Diagnosis Date   Anxiety    Arthritis    R hand- OA   Atrial Fibrillation  06/25/2013   CAD (coronary artery disease), native coronary artery 06/28/2013   a. LHC (06/27/13):  Mid LAD 40%, mid D1 75%, mid CFX 50-60%, mid RCA 50%, EF 20%, LVEDP 24 mmHg.   CHF (congestive heart failure) (HCC)    Chronic combined systolic and diastolic heart failure (HCC) 06/26/2013   a.  Echo (06/25/13):  EF 20-25%, diff HK, restrictive physio, mild MR, mod to severe LAE, mild RVE, mildly reduced RVSF, mod to severe RAE   Chronic kidney disease    Dyslipidemia 07/15/2013   Dysrhythmia    a-fib   GERD (gastroesophageal reflux disease)    Hypertension    Incidental pulmonary nodule 06/22/2014   Seen on CT angiogram of chest   NICM (nonischemic cardiomyopathy) with EF 20-25% 06/26/2013   Obesity    Penetrating atherosclerotic ulcer of aorta (HCC) 10/22/2013   Small penetrating ulcer of descending thoracic aorta discovered on routine CTA   Prostate cancer (HCC)    watchful waiting, no treatment so far   S/P Maze operation for atrial fibrillation 10/29/2013   Complete bilateral atrial lesion set using cryothermy and bipolar radiofrequency ablation with  clipping of LA appendage via right mini thoracotomy   Shortness of breath    seen by [place], & low energy     Past Surgical History:  Procedure Laterality Date   CARDIAC CATHETERIZATION  06/2013   CARDIOVERSION N/A 06/30/2013   Procedure: CARDIOVERSION;  Surgeon: Traci R Turner, MD;  Location: [facility] ENDOSCOPY;  Service: Cardiovascular;  Laterality: N/A;   CARDIOVERSION N/A 07/31/2013   Procedure: CARDIOVERSION;  Surgeon: Philip J Nahser, MD;  Location: [facility] ENDOSCOPY;  Service: Cardiovascular;  Laterality: N/A;   CARDIOVERSION N/A 03/19/2018   Procedure: CARDIOVERSION;  Surgeon: Turner, Traci R, MD;  Location: [facility] ENDOSCOPY;  Service: Cardiovascular;  Laterality: N/A;   CARDIOVERSION N/A 07/24/2018   Procedure: CARDIOVERSION;  Surgeon: Turner, Traci R, MD;  Location[facility] ENDOSCOPY;  Service: Cardiovascular;  Laterality: N/A;   CLIPPING OF ATRIAL APPENDAGE  10/29/2013   Procedure: CLIPPING OF ATRIAL APPENDAGE;  Surgeon: Clarence H Owen, MD;  Location: [facility] OR;  Service: Open Heart Surgery;;   INTRAOPERATIVE TRANSESOPHAGEAL ECHOCARDIOGRAM N/A 10/29/2013   Procedure: INTRAOPERATIVE TRANSESOPHAGEAL ECHOCARDIOGRAM;  Surgeon: Clarence H Owen, MD;  [facility] OR;  Service: Open Heart Surgery;  Laterality: N/A;   LEFT AND RIGHT HEART CATHETERIZATION WITH CORONARY ANGIOGRAM N/A 06/27/2013   Procedure: LEFT AND RIGHT HEART CATHETERIZATION WITH CORONARY ANGIOGRAM;  Surgeon:   Jayadeep S Varanasi, MD;  [facility]: [facility] CATH LAB;  Service: Cardiovascular;  Laterality: N/A;   MEDIAL PARTIAL KNEE REPLACEMENT Left 1990's?   MINIMALLY INVASIVE MAZE PROCEDURE N/A 10/29/2013   Procedure: MINIMALLY INVASIVE MAZE PROCEDURE;  Surgeon: Clarence H Owen, MD;  [facility];  Service: Open Heart Surgery;  Laterality: N/A;   TEE WITHOUT CARDIOVERSION N/A 06/30/2013   Procedure: TRANSESOPHAGEAL ECHOCARDIOGRAM (TEE) ;  Surgeon: Traci R Turner, MD;  [facility] ENDOSCOPY;  Service: Cardiovascular;  Laterality: N/A;   TRANSURETHRAL RESECTION OF  PROSTATE  2010    There were no vitals filed for this visit.   Subjective Assessment - 10/12/20 1317     Subjective Feels like he needs to work more on LE strengthening because it helps the rest of his body.    Pertinent History prostate CA with resection 2010, HTN, GERD, HLD, CKD, chronic systolic/diastolic HF, CHF, CAD, a-fib, anxiety, L medial partial knee arthroplasty 1990's    Diagnostic tests 06/19/20 R knee MRI: Radial tear involving the posterior horn of the medial meniscus, probably remote large osteochondral lesion of medial femoral condyle    Patient Stated Goals decrease pain and increase strength    Currently in Pain? Yes    Pain Score 5     Pain Location Knee    Pain Orientation Right    Pain Descriptors / Indicators Aching    Pain Type Chronic pain                               [facility] Adult PT Treatment/Exercise - 10/12/20 0001       Knee/Hip Exercises: Aerobic   Nustep L6x6min      Knee/Hip Exercises: Seated   Long Arc Quad Strengthening;Both;2 sets;5 reps;Weights    Long Arc Quad Weight 3 lbs.    Marching Strengthening;Both;2 sets;10 reps    Marching Limitations red TB    Hamstring Curl Strengthening;Both;2 sets;10 reps    Hamstring Limitations blue TB                 Balance Exercises - 10/12/20 0001       Balance Exercises: Standing   Standing Eyes Opened Foam/compliant surface;3 reps;10 secs   on bosu   Marching Foam/compliant surface;Upper extremity assist 1;Static;10 reps   from balance disk   Other Standing Exercises trunk rotations from balance disk 10 reps each                 PT Short Term Goals - 08/23/20 1103       PT SHORT TERM GOAL #1   Title Patient to be independent with initial HEP.    Status Achieved               PT Long Term Goals - 09/27/20 1011       PT LONG TERM GOAL #1   Title Patient to be independent with advanced HEP.    Time 10    Status Partially Met   met for current    Target Date 10/25/20      PT LONG TERM GOAL #2   Title Patient to demonstrate B LE strength >/=4+/5.    Status Achieved      PT LONG TERM GOAL #3   Title Patient to demonstrate R knee AROM/PROM 0-120 degrees.    Status Achieved      PT LONG TERM GOAL #4   Title Patient to score >/=48/56 on Berg   in order to decrease risk of falls.    Time 10    Status Partially Met   08/23/20: 46/56   Target Date 10/25/20      PT LONG TERM GOAL #5   Title Patient to report 85% improvement in balance confidence with shower transfers.    Time 10    Status Partially Met   reports 60% improvement in balance with shower transfers   Target Date 10/25/20      PT LONG TERM GOAL #6   Title Patient to ambulate atleast 1216f with 6 minute walk test with 4WW in order to improve functional activity tolerance and endurance.    Time 10    Status On-going   ambulated 760 ft during 6 min walk test   Target Date 10/25/20                   Plan - 10/12/20 1357     Clinical Impression Statement Pt demonstrates fatigue today and required many breaks in between exercises due to decreased endurance. Progressed balance exercises to increase the need for LE stabilization and faciliate ankle strategy of recovery. He reported mild dizziness with trunk rotations but subsided with rest. Overall he responded well but is limited by endurance during sessions.    Personal Factors and Comorbidities Age;Comorbidity 3+;Fitness;Past/Current Experience;Time since onset of injury/illness/exacerbation    Comorbidities prostate CA with resection 2010, HTN, GERD, HLD, CKD, chronic systolic/diastolic HF, CHF, CAD, a-fib, anxiety, L medial partial knee arthroplasty 1990's    PT Frequency 2x / week    PT Duration 6 weeks    PT Treatment/Interventions ADLs/Self Care Home Management;Cryotherapy;Electrical Stimulation;Iontophoresis 461mml Dexamethasone;Moist Heat;Balance training;Therapeutic exercise;Therapeutic activities;Functional  mobility training;Stair training;Gait training;Ultrasound;Neuromuscular re-education;Patient/family education;Manual techniques;Vestibular;Vasopneumatic Device;Taping;Energy conservation;Dry needling;Passive range of motion    PT Next Visit Plan continue to progress hip strengthening as tolerated; Balance training working on SLS and narrow BOS    PT Home Exercise Plan Access Code: [email or web address]    Consulted and Agree with Plan of Care Patient             Patient will benefit from skilled therapeutic intervention in order to improve the following deficits and impairments:  Abnormal gait, Decreased endurance, Increased edema, Pain, Increased fascial restricitons, Decreased strength, Decreased activity tolerance, Decreased balance, Difficulty walking, Improper body mechanics, Decreased range of motion, Impaired flexibility, Postural dysfunction  Visit Diagnosis: Chronic pain of right knee  Muscle weakness (generalized)  Unsteadiness on feet  Other symptoms and signs involving the musculoskeletal system  Other abnormalities of gait and mobility     Problem List Patient Active Problem List   Diagnosis Date Noted   Pain of knee joint with osteochondral injury 07/21/2020   Carcinoma of prostate ([facility]CD[place]05/05/2020   Congestive heart failure (HC[facility]05/05/2020   Other psoriasis 06/30/2020   Other specified counseling 06/30/2020   Subchondral bone cyst 06/09/2020   OA (osteoarthritis) of knee 01/08/2020   Atypical atrial flutter (HCC)    Incidental pulmonary nodule 06/22/2014   Chronic kidney disease    S/P Maze operation for atrial fibrillation 10/29/2013   Penetrating atherosclerotic ulcer of aorta ([facility]08/26/2015   Chronic atrial fibrillation ([facility]07/29/2015   Dyslipidemia 07/15/2013   CAD (coronary artery disease), native coronary artery 06/28/2013   Dyspnea on exertion 06/26/2013   History of prostate cancer 06/26/2013   Chronic combined systolic and diastolic heart failure ([facility] 06/26/2013   NICM (nonischemic cardiomyopathy) with EF 20-25% 06/26/2013    Class: Diagnosis of  Atrial Fibrillation  06/25/2013   HTN (hypertension) 06/25/2013    Braylin L Clark, PTA 10/12/2020, 2:26 PM  [facility] [place] [address]  [address] [place], [place], [address] Phone: [phone]   Fax:  [phone]  Name: Jose Snyder MRN: [id number] Date of Birth: 02/10/1941    

## 2020-10-15 ENCOUNTER — Ambulatory Visit: Payer: Medicare Other

## 2020-10-15 ENCOUNTER — Other Ambulatory Visit: Payer: Self-pay

## 2020-10-15 DIAGNOSIS — R2689 Other abnormalities of gait and mobility: Secondary | ICD-10-CM | POA: Diagnosis not present

## 2020-10-15 DIAGNOSIS — M6281 Muscle weakness (generalized): Secondary | ICD-10-CM | POA: Diagnosis not present

## 2020-10-15 DIAGNOSIS — M25561 Pain in right knee: Secondary | ICD-10-CM | POA: Diagnosis not present

## 2020-10-15 DIAGNOSIS — R29898 Other symptoms and signs involving the musculoskeletal system: Secondary | ICD-10-CM

## 2020-10-15 DIAGNOSIS — G8929 Other chronic pain: Secondary | ICD-10-CM | POA: Diagnosis not present

## 2020-10-15 DIAGNOSIS — R2681 Unsteadiness on feet: Secondary | ICD-10-CM | POA: Diagnosis not present

## 2020-10-15 NOTE — Therapy (Signed)
[facility] [address]  [place], [place], [address] Phone: [phone]   Fax:  [phone]  Physical Therapy Treatment  Patient Details  Name: Jose Snyder MRN: [id number] Date of Birth: 1940/10/15 Referring Provider (PT): Clearance Coots, MD   Encounter Date: 10/15/2020   PT End of Session - 10/15/20 1053     Visit Number 29    Number of Visits 34    Date for PT Re-Evaluation 10/25/20    Authorization Type Medicare & BCBS    Progress Note Due on Visit 84    PT Start Time 1006    PT Stop Time 1049    PT Time Calculation (min) 43 min    Activity Tolerance Patient tolerated treatment well    Behavior During Therapy [facility] for tasks assessed/performed             Past Medical History:  Diagnosis Date   Anxiety    Arthritis    R hand- OA   Atrial Fibrillation  06/25/2013   CAD (coronary artery disease), native coronary artery 06/28/2013   a. LHC (06/27/13):  Mid LAD 40%, mid D1 75%, mid CFX 50-60%, mid RCA 50%, EF 20%, LVEDP 24 mmHg.   CHF (congestive heart failure) (HCC)    Chronic combined systolic and diastolic heart failure ([place]) 06/26/2013   a.  Echo (06/25/13):  EF 20-25%, diff HK, restrictive physio, mild MR, mod to severe LAE, mild RVE, mildly reduced RVSF, mod to severe RAE   Chronic kidney disease    Dyslipidemia 07/15/2013   Dysrhythmia    a-fib   GERD (gastroesophageal reflux disease)    Hypertension    Incidental pulmonary nodule 06/22/2014   Seen on CT angiogram of chest   NICM (nonischemic cardiomyopathy) with EF 20-25% 06/26/2013   Obesity    Penetrating atherosclerotic ulcer of aorta ([place]) 10/22/2013   Small penetrating ulcer of descending thoracic aorta discovered on routine CTA   Prostate cancer ([place])    watchful waiting, no treatment so far   S/P Maze operation for atrial fibrillation 10/29/2013   Complete bilateral atrial lesion set using cryothermy and bipolar radiofrequency ablation with  clipping of LA appendage via right mini thoracotomy   Shortness of breath    seen by Stanford Breed, & low energy     Past Surgical History:  Procedure Laterality Date   CARDIAC CATHETERIZATION  06/2013   CARDIOVERSION N/A 06/30/2013   Procedure: CARDIOVERSION;  Surgeon: [name], MD;  Location: [place];  Service: Cardiovascular;  Laterality: N/A;   CARDIOVERSION N/A 07/31/2013   Procedure: CARDIOVERSION;  Surgeon: Thayer Headings, MD;  Location: [place];  Service: Cardiovascular;  Laterality: N/A;   CARDIOVERSION N/A 03/19/2018   Procedure: CARDIOVERSION;  Surgeon: [name], MD;  Location: [place];  Service: Cardiovascular;  Laterality: N/A;   CARDIOVERSION N/A 07/24/2018   Procedure: CARDIOVERSION;  Surgeon: [name], MD;  Location: [facility] ENDOSCOPY;  Service: Cardiovascular;  Laterality: N/A;   CLIPPING OF ATRIAL APPENDAGE  10/29/2013   Procedure: CLIPPING OF ATRIAL APPENDAGE;  Surgeon: Rexene Alberts, MD;  Location: [place];  Service: Open Heart Surgery;;   INTRAOPERATIVE TRANSESOPHAGEAL ECHOCARDIOGRAM N/A 10/29/2013   Procedure: INTRAOPERATIVE TRANSESOPHAGEAL ECHOCARDIOGRAM;  Surgeon: Rexene Alberts, MD;  Location: [place];  Service: Open Heart Surgery;  Laterality: N/A;   LEFT AND RIGHT HEART CATHETERIZATION WITH CORONARY ANGIOGRAM N/A 06/27/2013   Procedure: LEFT AND RIGHT HEART CATHETERIZATION WITH CORONARY ANGIOGRAM;  Surgeon:  Jettie Booze, MD;  Location: [facility] CATH LAB;  Service: Cardiovascular;  Laterality: N/A;   MEDIAL PARTIAL KNEE REPLACEMENT Left 1990's?   MINIMALLY INVASIVE MAZE PROCEDURE N/A 10/29/2013   Procedure: MINIMALLY INVASIVE MAZE PROCEDURE;  Surgeon: Rexene Alberts, MD;  Location: [place];  Service: Open Heart Surgery;  Laterality: N/A;   TEE WITHOUT CARDIOVERSION N/A 06/30/2013   Procedure: TRANSESOPHAGEAL ECHOCARDIOGRAM (TEE) ;  Surgeon: [name], MD;  Location: [place];  Service: Cardiovascular;  Laterality: N/A;   TRANSURETHRAL RESECTION OF  PROSTATE  2010    There were no vitals filed for this visit.   Subjective Assessment - 10/15/20 1009     Subjective Notices that he can feel muscle soreness in his knee after every session.    Pertinent History prostate CA with resection 2010, HTN, GERD, HLD, CKD, chronic systolic/diastolic HF, CHF, CAD, a-fib, anxiety, L medial partial knee arthroplasty 1990's    Diagnostic tests 06/19/20 R knee MRI: Radial tear involving the posterior horn of the medial meniscus, probably remote large osteochondral lesion of medial femoral condyle    Patient Stated Goals decrease pain and increase strength    Currently in Pain? Yes    Pain Score 5     Pain Location Knee    Pain Orientation Right    Pain Descriptors / Indicators Aching    Pain Type Chronic pain                               [facility] Adult PT Treatment/Exercise - 10/15/20 0001       Knee/Hip Exercises: Aerobic   Nustep L5x47min      Knee/Hip Exercises: Standing   Hip Abduction Stengthening;Both;2 sets;10 reps;Knee straight    Abduction Limitations red TB; UE support    Hip Extension Stengthening;Both;2 set;10 reps;Knee straight    Extension Limitations red TB; UE support at treadmill    Other Standing Knee Exercises hamstring curls with red TB 2x10; treadmill for UE support      Knee/Hip Exercises: Seated   Clamshell with TheraBand Blue   2x10   Marching Strengthening;Both;2 sets;10 reps    Marching Limitations blue TB      Manual Therapy   Manual Therapy Soft tissue mobilization;Myofascial release    Soft tissue mobilization IASTM to distal quads    Myofascial Release TPR to distal quads                      PT Short Term Goals - 08/23/20 1103       PT SHORT TERM GOAL #1   Title Patient to be independent with initial HEP.    Status Achieved               PT Long Term Goals - 09/27/20 1011       PT LONG TERM GOAL #1   Title Patient to be independent with advanced HEP.    Time  10    Status Partially Met   met for current   Target Date 10/25/20      PT LONG TERM GOAL #2   Title Patient to demonstrate B LE strength >/=4+/5.    Status Achieved      PT LONG TERM GOAL #3   Title Patient to demonstrate R knee AROM/PROM 0-120 degrees.    Status Achieved      PT LONG TERM GOAL #4   Title Patient to score >/=48/56  on Berg in order to decrease risk of falls.    Time 10    Status Partially Met   08/23/20: 46/56   Target Date 10/25/20      PT LONG TERM GOAL #5   Title Patient to report 85% improvement in balance confidence with shower transfers.    Time 10    Status Partially Met   reports 60% improvement in balance with shower transfers   Target Date 10/25/20      PT LONG TERM GOAL #6   Title Patient to ambulate atleast 1260ft with 6 minute walk test with 4WW in order to improve functional activity tolerance and endurance.    Time 10    Status On-going   ambulated 760 ft during 6 min walk test   Target Date 10/25/20                   Plan - 10/15/20 1055     Clinical Impression Statement Pt still demonstrating [place] with standing ther ex due to SOB. He reports that getting in and out of the shower is easier nowadays because he keeps all of his items nearby as he needs to. He also notes that his balance is better when transfering in and out of the shower. Two seated rest breaks required in between standing ther ex for recovery. Some cuing required with standing hs curls to isolate the hs muscles and prevent hip extension. Finished session with STM to the distal quads, found ttp and myofascial restrictions but improvements in knee mobility post MT per pt. Pt responded well.    Personal Factors and Comorbidities Age;Comorbidity 3+;Fitness;Past/Current Experience;Time since onset of injury/illness/exacerbation    Comorbidities prostate CA with resection 2010, HTN, GERD, HLD, CKD, chronic systolic/diastolic HF, CHF, CAD, a-fib, anxiety, L medial partial knee  arthroplasty 1990's    PT Frequency 2x / week    PT Duration 6 weeks    PT Treatment/Interventions ADLs/Self Care Home Management;Cryotherapy;Electrical Stimulation;Iontophoresis 4mg /ml Dexamethasone;Moist Heat;Balance training;Therapeutic exercise;Therapeutic activities;Functional mobility training;Stair training;Gait training;Ultrasound;Neuromuscular re-education;Patient/family education;Manual techniques;Vestibular;Vasopneumatic Device;Taping;Energy conservation;Dry needling;Passive range of motion    PT Next Visit Plan continue to progress hip strengthening as tolerated; Balance training working on SLS and narrow BOS; STM to distal quads, ITB    PT Home Exercise Plan Access Code: [email or web address]    Consulted and Agree with Plan of Care Patient             Patient will benefit from skilled therapeutic intervention in order to improve the following deficits and impairments:  Abnormal gait, Decreased endurance, Increased edema, Pain, Increased fascial restricitons, Decreased strength, Decreased activity tolerance, Decreased balance, Difficulty walking, Improper body mechanics, Decreased range of motion, Impaired flexibility, Postural dysfunction  Visit Diagnosis: Chronic pain of right knee  Muscle weakness (generalized)  Unsteadiness on feet  Other symptoms and signs involving the musculoskeletal system  Other abnormalities of gait and mobility     Problem List Patient Active Problem List   Diagnosis Date Noted   Pain of knee joint with osteochondral injury 07/21/2020   Carcinoma of prostate ([place]) 06/30/2020   Congestive heart failure ([place]) 06/30/2020   Other psoriasis 06/30/2020   Other specified counseling 06/30/2020   Subchondral bone cyst 06/09/2020   OA (osteoarthritis) of knee 01/08/2020   Atypical atrial flutter (HCC)    Incidental pulmonary nodule 06/22/2014   Chronic kidney disease    S/P Maze operation for atrial fibrillation 10/29/2013   Penetrating atherosclerotic  ulcer of aorta ([place]) 10/22/2013   Chronic  atrial fibrillation ([place]) 09/24/2013   Dyslipidemia 07/15/2013   CAD (coronary artery disease), native coronary artery 06/28/2013   Dyspnea on exertion 06/26/2013   History of prostate cancer 06/26/2013   Chronic combined systolic and diastolic heart failure ([place]) 06/26/2013   NICM (nonischemic cardiomyopathy) with EF 20-25% 06/26/2013    Class: Diagnosis of   Atrial Fibrillation  06/25/2013   HTN (hypertension) 06/25/2013    Artist Pais, PTA 10/15/2020, 11:10 AM  [facility] [address]  [place], [place], [address] Phone: [phone]   Fax:  [phone]  Name: [place] Souter MRN: [id number] Date of Birth: November 29, 1940

## 2020-10-18 ENCOUNTER — Encounter: Payer: Self-pay | Admitting: Physical Therapy

## 2020-10-18 ENCOUNTER — Other Ambulatory Visit: Payer: Self-pay

## 2020-10-18 ENCOUNTER — Ambulatory Visit: Payer: Medicare Other | Admitting: Physical Therapy

## 2020-10-18 DIAGNOSIS — M6281 Muscle weakness (generalized): Secondary | ICD-10-CM

## 2020-10-18 DIAGNOSIS — R29898 Other symptoms and signs involving the musculoskeletal system: Secondary | ICD-10-CM | POA: Diagnosis not present

## 2020-10-18 DIAGNOSIS — M25561 Pain in right knee: Secondary | ICD-10-CM | POA: Diagnosis not present

## 2020-10-18 DIAGNOSIS — G8929 Other chronic pain: Secondary | ICD-10-CM

## 2020-10-18 DIAGNOSIS — R2681 Unsteadiness on feet: Secondary | ICD-10-CM

## 2020-10-18 DIAGNOSIS — R2689 Other abnormalities of gait and mobility: Secondary | ICD-10-CM

## 2020-10-18 NOTE — Therapy (Signed)
[facility] [address]  [place], [place], [address] Phone: [phone]   Fax:  [phone]  Physical Therapy Treatment  Patient Details  Name: Jose Snyder MRN: [id number] Date of Birth: 20-Dec-1940 Referring Provider (PT): Clearance Coots, MD   Encounter Date: 10/18/2020   PT End of Session - 10/18/20 0850     Visit Number 30    Number of Visits 34    Date for PT Re-Evaluation 10/25/20    Authorization Type Medicare & BCBS    Progress Note Due on Visit 55    PT Start Time 0847    PT Stop Time 0927    PT Time Calculation (min) 40 min    Activity Tolerance Patient tolerated treatment well    Behavior During Therapy [facility] for tasks assessed/performed             Past Medical History:  Diagnosis Date   Anxiety    Arthritis    R hand- OA   Atrial Fibrillation  06/25/2013   CAD (coronary artery disease), native coronary artery 06/28/2013   a. LHC (06/27/13):  Mid LAD 40%, mid D1 75%, mid CFX 50-60%, mid RCA 50%, EF 20%, LVEDP 24 mmHg.   CHF (congestive heart failure) (HCC)    Chronic combined systolic and diastolic heart failure ([place]) 06/26/2013   a.  Echo (06/25/13):  EF 20-25%, diff HK, restrictive physio, mild MR, mod to severe LAE, mild RVE, mildly reduced RVSF, mod to severe RAE   Chronic kidney disease    Dyslipidemia 07/15/2013   Dysrhythmia    a-fib   GERD (gastroesophageal reflux disease)    Hypertension    Incidental pulmonary nodule 06/22/2014   Seen on CT angiogram of chest   NICM (nonischemic cardiomyopathy) with EF 20-25% 06/26/2013   Obesity    Penetrating atherosclerotic ulcer of aorta ([place]) 10/22/2013   Small penetrating ulcer of descending thoracic aorta discovered on routine CTA   Prostate cancer ([place])    watchful waiting, no treatment so far   S/P Maze operation for atrial fibrillation 10/29/2013   Complete bilateral atrial lesion set using cryothermy and bipolar radiofrequency ablation with  clipping of LA appendage via right mini thoracotomy   Shortness of breath    seen by Stanford Breed, & low energy     Past Surgical History:  Procedure Laterality Date   CARDIAC CATHETERIZATION  06/2013   CARDIOVERSION N/A 06/30/2013   Procedure: CARDIOVERSION;  Surgeon: [name], MD;  Location: [place];  Service: Cardiovascular;  Laterality: N/A;   CARDIOVERSION N/A 07/31/2013   Procedure: CARDIOVERSION;  Surgeon: Thayer Headings, MD;  Location: [place];  Service: Cardiovascular;  Laterality: N/A;   CARDIOVERSION N/A 03/19/2018   Procedure: CARDIOVERSION;  Surgeon: [name], MD;  Location: [place];  Service: Cardiovascular;  Laterality: N/A;   CARDIOVERSION N/A 07/24/2018   Procedure: CARDIOVERSION;  Surgeon: [name], MD;  Location: [facility] ENDOSCOPY;  Service: Cardiovascular;  Laterality: N/A;   CLIPPING OF ATRIAL APPENDAGE  10/29/2013   Procedure: CLIPPING OF ATRIAL APPENDAGE;  Surgeon: Rexene Alberts, MD;  Location: [place];  Service: Open Heart Surgery;;   INTRAOPERATIVE TRANSESOPHAGEAL ECHOCARDIOGRAM N/A 10/29/2013   Procedure: INTRAOPERATIVE TRANSESOPHAGEAL ECHOCARDIOGRAM;  Surgeon: Rexene Alberts, MD;  Location: [place];  Service: Open Heart Surgery;  Laterality: N/A;   LEFT AND RIGHT HEART CATHETERIZATION WITH CORONARY ANGIOGRAM N/A 06/27/2013   Procedure: LEFT AND RIGHT HEART CATHETERIZATION WITH CORONARY ANGIOGRAM;  Surgeon:  Jettie Booze, MD;  Location: [facility] CATH LAB;  Service: Cardiovascular;  Laterality: N/A;   MEDIAL PARTIAL KNEE REPLACEMENT Left 1990's?   MINIMALLY INVASIVE MAZE PROCEDURE N/A 10/29/2013   Procedure: MINIMALLY INVASIVE MAZE PROCEDURE;  Surgeon: Rexene Alberts, MD;  Location: [place];  Service: Open Heart Surgery;  Laterality: N/A;   TEE WITHOUT CARDIOVERSION N/A 06/30/2013   Procedure: TRANSESOPHAGEAL ECHOCARDIOGRAM (TEE) ;  Surgeon: [name], MD;  Location: [place];  Service: Cardiovascular;  Laterality: N/A;   TRANSURETHRAL RESECTION OF  PROSTATE  2010    There were no vitals filed for this visit.   Subjective Assessment - 10/18/20 0849     Subjective Knees bothering him "that dampness."    Pertinent History prostate CA with resection 2010, HTN, GERD, HLD, CKD, chronic systolic/diastolic HF, CHF, CAD, a-fib, anxiety, L medial partial knee arthroplasty 1990's    Diagnostic tests 06/19/20 R knee MRI: Radial tear involving the posterior horn of the medial meniscus, probably remote large osteochondral lesion of medial femoral condyle    Patient Stated Goals decrease pain and increase strength    Currently in Pain? Yes    Pain Score 5     Pain Location Knee    Pain Orientation Right;Left                               [facility] Adult PT Treatment/Exercise - 10/18/20 0001       Exercises   Exercises Knee/Hip;Other Exercises      Knee/Hip Exercises: Aerobic   Nustep L5x109mn      Knee/Hip Exercises: Standing   Hip Abduction Stengthening;Both;2 sets;10 reps;Knee straight    Abduction Limitations red TB; UE support    Hip Extension Stengthening;Both;1 set;10 reps;Knee straight    Extension Limitations red TB; UE support at treadmill    Other Standing Knee Exercises hamstring curls with red TB 2x10; treadmill for UE support    Other Standing Knee Exercises sidestepping with red loop around ankles 4x length of counter      Knee/Hip Exercises: Seated   Clamshell with TheraBand Blue    Marching Strengthening;Both;2 sets;10 reps    Marching Limitations blue TB                      PT Short Term Goals - 08/23/20 1103       PT SHORT TERM GOAL #1   Title Patient to be independent with initial HEP.    Status Achieved               PT Long Term Goals - 09/27/20 1011       PT LONG TERM GOAL #1   Title Patient to be independent with advanced HEP.    Time 10    Status Partially Met   met for current   Target Date 10/25/20      PT LONG TERM GOAL #2   Title Patient to demonstrate B  LE strength >/=4+/5.    Status Achieved      PT LONG TERM GOAL #3   Title Patient to demonstrate R knee AROM/PROM 0-120 degrees.    Status Achieved      PT LONG TERM GOAL #4   Title Patient to score >/=48/56 on Berg in order to decrease risk of falls.    Time 10    Status Partially Met   08/23/20: 46/56   Target Date 10/25/20  PT LONG TERM GOAL #5   Title Patient to report 85% improvement in balance confidence with shower transfers.    Time 10    Status Partially Met   reports 60% improvement in balance with shower transfers   Target Date 10/25/20      PT LONG TERM GOAL #6   Title Patient to ambulate atleast 1255f with 6 minute walk test with 4WW in order to improve functional activity tolerance and endurance.    Time 10    Status On-going   ambulated 760 ft during 6 min walk test   Target Date 10/25/20                   Plan - 10/18/20 0851     Clinical Impression Statement Pt. still becomes fatigue and short of breath with standing exercise.  Otherwise reports improvements with standing balance at home, and has started using kettle bells (seated exercises).  Discussed discharging on Thursday.  He is willing to discharge [facility]  Provided information/calender today for him.  End of session MT to both knees decreased tightness.   Plan to review HEP and discharge next visit.    Personal Factors and Comorbidities Age;Comorbidity 3+;Fitness;Past/Current Experience;Time since onset of injury/illness/exacerbation    Comorbidities prostate CA with resection 2010, HTN, GERD, HLD, CKD, chronic systolic/diastolic HF, CHF, CAD, a-fib, anxiety, L medial partial knee arthroplasty 1990's    PT Frequency 2x / week    PT Duration 6 weeks    PT Treatment/Interventions ADLs/Self Care Home Management;Cryotherapy;Electrical Stimulation;Iontophoresis 425mml Dexamethasone;Moist Heat;Balance training;Therapeutic exercise;Therapeutic  activities;Functional mobility training;Stair training;Gait training;Ultrasound;Neuromuscular re-education;Patient/family education;Manual techniques;Vestibular;Vasopneumatic Device;Taping;Energy conservation;Dry needling;Passive range of motion    PT Next Visit Plan discharge    PT Home Exercise Plan Access Code: [email or web address]    Consulted and Agree with Plan of Care Patient             Patient will benefit from skilled therapeutic intervention in order to improve the following deficits and impairments:  Abnormal gait, Decreased endurance, Increased edema, Pain, Increased fascial restricitons, Decreased strength, Decreased activity tolerance, Decreased balance, Difficulty walking, Improper body mechanics, Decreased range of motion, Impaired flexibility, Postural dysfunction  Visit Diagnosis: Chronic pain of right knee  Muscle weakness (generalized)  Unsteadiness on feet  Other symptoms and signs involving the musculoskeletal system  Other abnormalities of gait and mobility     Problem List Patient Active Problem List   Diagnosis Date Noted   Pain of knee joint with osteochondral injury 07/21/2020   Carcinoma of prostate ([facility]05/05/2020   Congestive heart failure ([facility]05/05/2020   Other psoriasis 06/30/2020   Other specified counseling 06/30/2020   Subchondral bone cyst 06/09/2020   OA (osteoarthritis) of knee 01/08/2020   Atypical atrial flutter (HCC)    Incidental pulmonary nodule 06/22/2014   Chronic kidney disease    S/P Maze operation for atrial fibrillation 10/29/2013   Penetrating atherosclerotic ulcer of aorta ([facility]08/26/2015   Chronic atrial fibrillation ([facility]07/29/2015   Dyslipidemia 07/15/2013   CAD (coronary artery disease), native coronary artery 06/28/2013   Dyspnea on exertion 06/26/2013   History of prostate cancer 06/26/2013   Chronic combined systolic and diastolic heart failure ([facility]04/30/2015   NICM (nonischemic cardiomyopathy) with EF 20-25%  06/26/2013    Class: Diagnosis of   Atrial Fibrillation  06/25/2013   HTN (hypertension) 06/25/2013    ElRennie NatterT, DPT 10/18/2020, 9:32 AM  [facility] [address]  [place], [place], [address] Phone: [phone]   Fax:  [phone]  Name: Jose Snyder MRN: [id number] Date of Birth: August 31, 1940

## 2020-10-21 ENCOUNTER — Ambulatory Visit: Payer: Medicare Other

## 2020-10-21 ENCOUNTER — Other Ambulatory Visit: Payer: Self-pay

## 2020-10-21 DIAGNOSIS — G8929 Other chronic pain: Secondary | ICD-10-CM | POA: Diagnosis not present

## 2020-10-21 DIAGNOSIS — M6281 Muscle weakness (generalized): Secondary | ICD-10-CM | POA: Diagnosis not present

## 2020-10-21 DIAGNOSIS — M25561 Pain in right knee: Secondary | ICD-10-CM | POA: Diagnosis not present

## 2020-10-21 DIAGNOSIS — R2681 Unsteadiness on feet: Secondary | ICD-10-CM

## 2020-10-21 DIAGNOSIS — R29898 Other symptoms and signs involving the musculoskeletal system: Secondary | ICD-10-CM | POA: Diagnosis not present

## 2020-10-21 DIAGNOSIS — R2689 Other abnormalities of gait and mobility: Secondary | ICD-10-CM | POA: Diagnosis not present

## 2020-10-21 NOTE — Therapy (Signed)
[place] [address]  [place], [place], [address] Phone: [phone]   Fax:  [phone]  Physical Therapy Treatment/ Discharge Summary  Patient Details  Name: Jose Snyder MRN: [id number] Date of Birth: Jul 04, 1940 Referring Provider (PT): Clearance Coots, MD   Encounter Date: 10/21/2020   PT End of Session - 10/21/20 1010     Visit Number 31    Number of Visits 34    Date for PT Re-Evaluation 10/25/20    Authorization Type Medicare & BCBS    Progress Note Due on Visit 46    PT Start Time 0925    PT Stop Time 1011    PT Time Calculation (min) 46 min    Activity Tolerance Patient tolerated treatment well    Behavior During Therapy [facility] for tasks assessed/performed             Past Medical History:  Diagnosis Date   Anxiety    Arthritis    R hand- OA   Atrial Fibrillation  06/25/2013   CAD (coronary artery disease), native coronary artery 06/28/2013   a. LHC (06/27/13):  Mid LAD 40%, mid D1 75%, mid CFX 50-60%, mid RCA 50%, EF 20%, LVEDP 24 mmHg.   CHF (congestive heart failure) (HCC)    Chronic combined systolic and diastolic heart failure ([place]) 06/26/2013   a.  Echo (06/25/13):  EF 20-25%, diff HK, restrictive physio, mild MR, mod to severe LAE, mild RVE, mildly reduced RVSF, mod to severe RAE   Chronic kidney disease    Dyslipidemia 07/15/2013   Dysrhythmia    a-fib   GERD (gastroesophageal reflux disease)    Hypertension    Incidental pulmonary nodule 06/22/2014   Seen on CT angiogram of chest   NICM (nonischemic cardiomyopathy) with EF 20-25% 06/26/2013   Obesity    Penetrating atherosclerotic ulcer of aorta ([place]) 10/22/2013   Small penetrating ulcer of descending thoracic aorta discovered on routine CTA   Prostate cancer ([place])    watchful waiting, no treatment so far   S/P Maze operation for atrial fibrillation 10/29/2013   Complete bilateral atrial lesion set using cryothermy and bipolar radiofrequency  ablation with clipping of LA appendage via right mini thoracotomy   Shortness of breath    seen by Stanford Breed, & low energy     Past Surgical History:  Procedure Laterality Date   CARDIAC CATHETERIZATION  06/2013   CARDIOVERSION N/A 06/30/2013   Procedure: CARDIOVERSION;  Surgeon: [name], MD;  Location: [place];  Service: Cardiovascular;  Laterality: N/A;   CARDIOVERSION N/A 07/31/2013   Procedure: CARDIOVERSION;  Surgeon: Thayer Headings, MD;  Location: [place];  Service: Cardiovascular;  Laterality: N/A;   CARDIOVERSION N/A 03/19/2018   Procedure: CARDIOVERSION;  Surgeon: [name], MD;  Location: [place];  Service: Cardiovascular;  Laterality: N/A;   CARDIOVERSION N/A 07/24/2018   Procedure: CARDIOVERSION;  Surgeon: [name], MD;  Location: [facility] ENDOSCOPY;  Service: Cardiovascular;  Laterality: N/A;   CLIPPING OF ATRIAL APPENDAGE  10/29/2013   Procedure: CLIPPING OF ATRIAL APPENDAGE;  Surgeon: Rexene Alberts, MD;  Location: [place];  Service: Open Heart Surgery;;   INTRAOPERATIVE TRANSESOPHAGEAL ECHOCARDIOGRAM N/A 10/29/2013   Procedure: INTRAOPERATIVE TRANSESOPHAGEAL ECHOCARDIOGRAM;  Surgeon: Rexene Alberts, MD;  Location: [place];  Service: Open Heart Surgery;  Laterality: N/A;   LEFT AND RIGHT HEART CATHETERIZATION WITH CORONARY ANGIOGRAM N/A 06/27/2013   Procedure: LEFT AND RIGHT HEART CATHETERIZATION WITH CORONARY ANGIOGRAM;  Surgeon: Jettie Booze, MD;  Location[facility] C[facility] LAB;  Service: Cardiovascular;  Laterality: N/A;   MEDIAL PARTIAL KNEE REPLACEMENT Left 1990's?   MINIMALLY INVASIVE MAZE PROCEDURE N/A 10/29/2013   Procedure: MINIMALLY INVASIVE MAZE PROCEDURE;  Surgeon: Rexene Alberts, MD;  Location: [place];  Service: Open Heart Surgery;  Laterality: N/A;   TEE WITHOUT CARDIOVERSION N/A 06/30/2013   Procedure: TRANSESOPHAGEAL ECHOCARDIOGRAM (TEE) ;  Surgeon: [name], MD;  Location: [place];  Service: Cardiovascular;  Laterality: N/A;    TRANSURETHRAL RESECTION OF PROSTATE  2010    There were no vitals filed for this visit.   Subjective Assessment - 10/21/20 0928     Subjective Pt feels like he is all ready to transition to home program.    Pertinent History prostate CA with resection 2010, HTN, GERD, HLD, CKD, chronic systolic/diastolic HF, CHF, CAD, a-fib, anxiety, L medial partial knee arthroplasty 1990's    Diagnostic tests 06/19/20 R knee MRI: Radial tear involving the posterior horn of the medial meniscus, probably remote large osteochondral lesion of medial femoral condyle    Patient Stated Goals decrease pain and increase strength    Currently in Pain? Yes    Pain Score 4     Pain Location Knee    Pain Orientation Right    Pain Descriptors / Indicators Aching                [facility] PT Assessment - 10/21/20 0001       Berg Balance Test   Sit to Stand Able to stand without using hands and stabilize independently    Standing Unsupported Able to stand safely 2 minutes    Sitting with Back Unsupported but Feet Supported on Floor or Stool Able to sit safely and securely 2 minutes    Stand to Sit Sits safely with minimal use of hands    Transfers Able to transfer safely, minor use of hands    Standing Unsupported with Eyes Closed Able to stand 10 seconds safely    Standing Unsupported with Feet Together Able to place feet together independently and stand 1 minute safely    From Standing, Reach Forward with Outstretched Arm Can reach confidently >25 cm (10")    From Standing Position, Pick up Object from Floor Able to pick up shoe safely and easily    From Standing Position, Turn to Look Behind Over each Shoulder Looks behind from both sides and weight shifts well    Turn 360 Degrees Able to turn 360 degrees safely in 4 seconds or less    Standing Unsupported, Alternately Place Feet on Step/Stool Able to stand independently and complete 8 steps >20 seconds    Standing Unsupported, One Foot in [employer] balance  while stepping or standing    Standing on One Leg Tries to lift leg/unable to hold 3 seconds but remains standing independently    Total Score 48                           [facility] Adult PT Treatment/Exercise - 10/21/20 0001       Exercises   Exercises Knee/Hip      Knee/Hip Exercises: Aerobic   Nustep L5x79min      Knee/Hip Exercises: Standing   Hip Abduction Stengthening;Both;10 reps;Knee straight    Abduction Limitations red TB with UE support    Extension Limitations reviewed      Knee/Hip Exercises: Seated   Marching Strengthening;Both;2  sets;10 reps    Marching Limitations red TB at feet    Sit to Sand 10 reps;without UE support                    PT Education - 10/21/20 1029     Education Details HEP review, consult about [place] program, giving adequate rest.    Person(s) Educated Patient    Methods Explanation    Comprehension Verbalized understanding              PT Short Term Goals - 08/23/20 1103       PT SHORT TERM GOAL #1   Title Patient to be independent with initial HEP.    Status Achieved               PT Long Term Goals - 10/21/20 0949       PT LONG TERM GOAL #1   Title Patient to be independent with advanced HEP.    Time 10    Status Achieved   met for current     PT LONG TERM GOAL #2   Title Patient to demonstrate B LE strength >/=4+/5.    Status Achieved      PT LONG TERM GOAL #3   Title Patient to demonstrate R knee AROM/PROM 0-120 degrees.    Status Achieved      PT LONG TERM GOAL #4   Title Patient to score >/=48/56 on Berg in order to decrease risk of falls.    Time 10    Status Achieved   08/23/20: 48/56     PT LONG TERM GOAL #5   Title Patient to report 85% improvement in balance confidence with shower transfers.    Time 10    Status Partially Met   reports 60% improvement in balance with shower transfers     PT LONG TERM GOAL #6   Title Patient to ambulate atleast 1216ft with 6  minute walk test with 4WW in order to improve functional activity tolerance and endurance.    Time 10    Status On-going   ambulated 760 ft during 6 min walk test                  Plan - 10/21/20 1012     Clinical Impression Statement Pt has met LTG 4 demonstrating a decreased risk of falls. Reviewed HEP exercises with him today to ensure understanding. He plans to join [facility] and continue with exercises programs there. Consulted with him about giving himself rest as needed, avoiding over activity. [place] has a good home workout regimen going and we have condensed his HEP to exercises that are important for balance and to decrease R knee pain. After today's session he is ready for D/C, he has met most LTGs and gained function with less R knee pain.    Personal Factors and Comorbidities Age;Comorbidity 3+;Fitness;Past/Current Experience;Time since onset of injury/illness/exacerbation    Comorbidities prostate CA with resection 2010, HTN, GERD, HLD, CKD, chronic systolic/diastolic HF, CHF, CAD, a-fib, anxiety, L medial partial knee arthroplasty 1990's    PT Frequency 2x / week    PT Duration 6 weeks    PT Treatment/Interventions ADLs/Self Care Home Management;Cryotherapy;Electrical Stimulation;Iontophoresis 4mg /ml Dexamethasone;Moist Heat;Balance training;Therapeutic exercise;Therapeutic activities;Functional mobility training;Stair training;Gait training;Ultrasound;Neuromuscular re-education;Patient/family education;Manual techniques;Vestibular;Vasopneumatic Device;Taping;Energy conservation;Dry needling;Passive range of motion    PT Next Visit Plan discharge    PT Home Exercise Plan Access Code: [email or web address]    Consulted and Agree with Plan of  Care Patient             Patient will benefit from skilled therapeutic intervention in order to improve the following deficits and impairments:  Abnormal gait, Decreased endurance, Increased edema, Pain, Increased fascial restricitons,  Decreased strength, Decreased activity tolerance, Decreased balance, Difficulty walking, Improper body mechanics, Decreased range of motion, Impaired flexibility, Postural dysfunction  Visit Diagnosis: Chronic pain of right knee  Muscle weakness (generalized)  Unsteadiness on feet  Other symptoms and signs involving the musculoskeletal system  Other abnormalities of gait and mobility     Problem List Patient Active Problem List   Diagnosis Date Noted   Pain of knee joint with osteochondral injury 07/21/2020   Carcinoma of prostate ([place]) 06/30/2020   Congestive heart failure ([place]) 06/30/2020   Other psoriasis 06/30/2020   Other specified counseling 06/30/2020   Subchondral bone cyst 06/09/2020   OA (osteoarthritis) of knee 01/08/2020   Atypical atrial flutter (HCC)    Incidental pulmonary nodule 06/22/2014   Chronic kidney disease    S/P Maze operation for atrial fibrillation 10/29/2013   Penetrating atherosclerotic ulcer of aorta ([place]) 10/22/2013   Chronic atrial fibrillation ([place]) 09/24/2013   Dyslipidemia 07/15/2013   CAD (coronary artery disease), native coronary artery 06/28/2013   Dyspnea on exertion 06/26/2013   History of prostate cancer 06/26/2013   Chronic combined systolic and diastolic heart failure ([place]) 06/26/2013   NICM (nonischemic cardiomyopathy) with EF 20-25% 06/26/2013    Class: Diagnosis of   Atrial Fibrillation  06/25/2013   HTN (hypertension) 06/25/2013    Artist Pais, PTA 10/21/2020, 10:29 AM  [facility] [address]  [place], [place], [address] Phone: [phone]   Fax:  [phone]  Name: Yechiel Erny MRN: [id number] Date of Birth: 05-Sep-1940

## 2020-10-25 ENCOUNTER — Ambulatory Visit (INDEPENDENT_AMBULATORY_CARE_PROVIDER_SITE_OTHER): Payer: Medicare Other | Admitting: Family Medicine

## 2020-10-25 ENCOUNTER — Other Ambulatory Visit: Payer: Self-pay

## 2020-10-25 ENCOUNTER — Encounter: Payer: Self-pay | Admitting: Family Medicine

## 2020-10-25 DIAGNOSIS — M1711 Unilateral primary osteoarthritis, right knee: Secondary | ICD-10-CM

## 2020-10-25 NOTE — Assessment & Plan Note (Signed)
Continues to get improvement.  Improving his balance and mobility. -Counseled on home exercise therapy and supportive care. -Can follow-up as needed.

## 2020-10-25 NOTE — Progress Notes (Signed)
Jose Snyder - 80 y.o. male MRN [id number]  Date of birth: October 06, 1940  SUBJECTIVE:  Including CC & ROS.  No chief complaint on file.   Jose Snyder is a 80 y.o. male that is following up for his right knee pain.  He continues to get improvement.  He has graduated physical therapy.  He is planning on getting a senior workout group in the community.   Review of Systems See HPI   HISTORY: Past Medical, Surgical, Social, and Family History Reviewed & Updated per EMR.   Pertinent Historical Findings include:  Past Medical History:  Diagnosis Date   Anxiety    Arthritis    R hand- OA   Atrial Fibrillation  06/25/2013   CAD (coronary artery disease), native coronary artery 06/28/2013   a. LHC (06/27/13):  Mid LAD 40%, mid D1 75%, mid CFX 50-60%, mid RCA 50%, EF 20%, LVEDP 24 mmHg.   CHF (congestive heart failure) (HCC)    Chronic combined systolic and diastolic heart failure ([place]) 06/26/2013   a.  Echo (06/25/13):  EF 20-25%, diff HK, restrictive physio, mild MR, mod to severe LAE, mild RVE, mildly reduced RVSF, mod to severe RAE   Chronic kidney disease    Dyslipidemia 07/15/2013   Dysrhythmia    a-fib   GERD (gastroesophageal reflux disease)    Hypertension    Incidental pulmonary nodule 06/22/2014   Seen on CT angiogram of chest   NICM (nonischemic cardiomyopathy) with EF 20-25% 06/26/2013   Obesity    Penetrating atherosclerotic ulcer of aorta ([place]) 10/22/2013   Small penetrating ulcer of descending thoracic aorta discovered on routine CTA   Prostate cancer ([place])    watchful waiting, no treatment so far   S/P Maze operation for atrial fibrillation 10/29/2013   Complete bilateral atrial lesion set using cryothermy and bipolar radiofrequency ablation with clipping of LA appendage via right mini thoracotomy   Shortness of breath    seen by Stanford Breed, & low energy     Past Surgical History:  Procedure Laterality Date   CARDIAC CATHETERIZATION  06/2013   CARDIOVERSION N/A 06/30/2013   Procedure:  CARDIOVERSION;  Surgeon: [name], MD;  Location: [place];  Service: Cardiovascular;  Laterality: N/A;   CARDIOVERSION N/A 07/31/2013   Procedure: CARDIOVERSION;  Surgeon: Thayer Headings, MD;  Location: [place];  Service: Cardiovascular;  Laterality: N/A;   CARDIOVERSION N/A 03/19/2018   Procedure: CARDIOVERSION;  Surgeon: [name], MD;  Location: [place];  Service: Cardiovascular;  Laterality: N/A;   CARDIOVERSION N/A 07/24/2018   Procedure: CARDIOVERSION;  Surgeon: [name], MD;  Location: [facility] ENDOSCOPY;  Service: Cardiovascular;  Laterality: N/A;   CLIPPING OF ATRIAL APPENDAGE  10/29/2013   Procedure: CLIPPING OF ATRIAL APPENDAGE;  Surgeon: Rexene Alberts, MD;  Location: [place];  Service: Open Heart Surgery;;   INTRAOPERATIVE TRANSESOPHAGEAL ECHOCARDIOGRAM N/A 10/29/2013   Procedure: INTRAOPERATIVE TRANSESOPHAGEAL ECHOCARDIOGRAM;  Surgeon: Rexene Alberts, MD;  Location: [place];  Service: Open Heart Surgery;  Laterality: N/A;   LEFT AND RIGHT HEART CATHETERIZATION WITH CORONARY ANGIOGRAM N/A 06/27/2013   Procedure: LEFT AND RIGHT HEART CATHETERIZATION WITH CORONARY ANGIOGRAM;  Surgeon: Jettie Booze, MD;  Location[facility] CATH LAB;  Service: Cardiovascular;  Laterality: N/A;   MEDIAL PARTIAL KNEE REPLACEMENT Left 1990's?   MINIMALLY INVASIVE MAZE PROCEDURE N/A 10/29/2013   Procedure: MINIMALLY INVASIVE MAZE PROCEDURE;  Surgeon: Rexene Alberts, MD;  Location: [place];  Service: Open Heart Surgery;  Laterality: N/A;  TEE WITHOUT CARDIOVERSION N/A 06/30/2013   Procedure: TRANSESOPHAGEAL ECHOCARDIOGRAM (TEE) ;  Surgeon: [name], MD;  Location[facility] ENDOSCOPY;  Service: Cardiovascular;  Laterality: N/A;   TRANSURETHRAL RESECTION OF PROSTATE  2010    Family History  Problem Relation Age of Onset   Healthy Father    Healthy Mother    Healthy Sister     Social History   Socioeconomic History   Marital status: Widowed    Spouse name: Not on file   Number of  children: Not on file   Years of education: Not on file   Highest education level: Not on file  Occupational History   Not on file  Tobacco Use   Smoking status: Never   Smokeless tobacco: Never   Tobacco comments:    06/25/2013 "smoked very light; years and years ago"  Vaping Use   Vaping Use: Never used  Substance and Sexual Activity   Alcohol use: Yes    Alcohol/week: 14.0 standard drinks    Types: 7 Glasses of wine, 7 Shots of liquor per week   Drug use: No   Sexual activity: Not Currently  Other Topics Concern   Not on file  Social History Narrative   [place] lives in [place]. He is widowed. Wife passed of breast cancer last year. No children.    Social Determinants of Health   Financial Resource Strain: Not on file  Food Insecurity: Not on file  Transportation Needs: Not on file  Physical Activity: Not on file  Stress: Not on file  Social Connections: Not on file  Intimate Partner Violence: Not on file     PHYSICAL EXAM:  VS: Ht '6\' 2"'$  (1.88 m)   Wt 260 lb (117.9 kg)   BMI 33.38 kg/m  Physical Exam Gen: NAD, alert, cooperative with exam, well-appearing     ASSESSMENT & PLAN:   OA (osteoarthritis) of knee Continues to get improvement.  Improving his balance and mobility. -Counseled on home exercise therapy and supportive care. -Can follow-up as needed.

## 2020-10-26 ENCOUNTER — Encounter: Payer: Medicare Other | Admitting: Physical Therapy

## 2020-11-02 ENCOUNTER — Encounter: Payer: Medicare Other | Admitting: Physical Therapy

## 2020-11-09 ENCOUNTER — Encounter: Payer: Medicare Other | Admitting: Physical Therapy

## 2020-11-12 ENCOUNTER — Ambulatory Visit: Payer: Medicare Other

## 2020-11-16 ENCOUNTER — Encounter: Payer: Medicare Other | Admitting: Physical Therapy

## 2020-12-13 ENCOUNTER — Other Ambulatory Visit: Payer: Self-pay | Admitting: Cardiology

## 2020-12-13 DIAGNOSIS — I480 Paroxysmal atrial fibrillation: Secondary | ICD-10-CM

## 2020-12-21 NOTE — Progress Notes (Signed)
HPI: FU atrial fibrillation. Echo 4/15 EF 20-25%. LHC demonstrated mod non-obs CAD. Most significant lesion was a mid D1 with 75% stenosis. DCM was felt to be out of proportion to CAD (NICM). Had DCCV on amiodarone but did not hold sinus. He was seen by Dr. Rayann Heman and referred to Dr. Roxy Manns for surgical maze. Carotid Dopplers August 2015 showed 1-39% bilateral stenosis. Patient underwent maze procedure with clipping of left atrial appendage on October 29, 2013. CTA April 2018 showed focal saccular outpouching of the distal descending thoracic aorta with no intramural hematoma or inflammatory changes.  Findings felt possibly early ulcer formation and not a penetrating ulcer per se.  Images were reviewed by Dr. Roxy Manns and no further follow-up recommended. Echocardiogram October 2019 showed normal LV function, moderate left ventricular hypertrophy,  mildly dilated ascending aorta and mild mitral regurgitation. At previous office visit May 2020 patient noted to be in atrial flutter.  Had successful cardioversion Jul 24, 2018.  Since last seen, he has dyspnea on exertion unchanged.  No orthopnea, PND, pedal edema, chest pain, palpitations, syncope or bleeding.  Current Outpatient Medications  Medication Sig Dispense Refill   amiodarone (PACERONE) 200 MG tablet TAKE ONE (1) TABLET BY MOUTH EACH DAY. 90 tablet 1   amLODipine (NORVASC) 10 MG tablet Take 1 tablet (10 mg total) by mouth daily at 6 PM. 90 tablet 3   apixaban (ELIQUIS) 5 MG TABS tablet Take 1 tablet (5 mg total) by mouth 2 (two) times daily. 60 tablet 11   atorvastatin (LIPITOR) 80 MG tablet Take 1 tablet (80 mg total) by mouth daily with breakfast. 30 tablet 12   chlorhexidine (PERIDEX) 0.12 % solution RINSE 1/2 OUNCE (15ML) BY MOUTH TWICE A DAY AS DIRECTED BY YOUR MEDICAL PROVIDER     Cholecalciferol 50 MCG (2000 UT) TABS Take 1 tablet by mouth daily.     COVID-19 Ad26 vaccine, JANSSEN/J&J, 0.5 ML injection INJECT AS DIRECTED .5 mL 0    docusate sodium (COLACE) 100 MG capsule Take 400 mg by mouth at bedtime as needed for mild constipation.      furosemide (LASIX) 40 MG tablet Take 40 mg by mouth daily.     HYDROcodone-acetaminophen (NORCO/VICODIN) 5-325 MG tablet Take 1 tablet by mouth every 8 (eight) hours as needed. 15 tablet 0   ibuprofen (ADVIL,MOTRIN) 200 MG tablet Take 400 mg by mouth every 8 (eight) hours as needed for headache or moderate pain.      lisinopril (PRINIVIL,ZESTRIL) 40 MG tablet Take 40 mg by mouth daily.     Melatonin 3 MG CAPS Take 3 mg by mouth at bedtime.     metoprolol succinate (TOPROL-XL) 25 MG 24 hr tablet Take 1 tablet (25 mg total) by mouth daily. KEEP OV. 30 tablet 2   omeprazole (PRILOSEC) 20 MG capsule Take 20 mg by mouth daily.     temazepam (RESTORIL) 15 MG capsule Take 15 mg by mouth at bedtime as needed for sleep.  (Patient not taking: Reported on 12/29/2020)     Current Facility-Administered Medications  Medication Dose Route Frequency Provider Last Rate Last Admin   sodium chloride flush (NS) 0.9 % injection 3 mL  3 mL Intravenous Q12H Lelon Perla, MD         Past Medical History:  Diagnosis Date   Anxiety    Arthritis    R hand- OA   Atrial Fibrillation  06/25/2013   CAD (coronary artery disease), native coronary artery 06/28/2013  a. LHC (06/27/13):  Mid LAD 40%, mid D1 75%, mid CFX 50-60%, mid RCA 50%, EF 20%, LVEDP 24 mmHg.   CHF (congestive heart failure) (HCC)    Chronic combined systolic and diastolic heart failure ([place]) 06/26/2013   a.  Echo (06/25/13):  EF 20-25%, diff HK, restrictive physio, mild MR, mod to severe LAE, mild RVE, mildly reduced RVSF, mod to severe RAE   Chronic kidney disease    Dyslipidemia 07/15/2013   Dysrhythmia    a-fib   GERD (gastroesophageal reflux disease)    Hypertension    Incidental pulmonary nodule 06/22/2014   Seen on CT angiogram of chest   NICM (nonischemic cardiomyopathy) with EF 20-25% 06/26/2013   Obesity    Penetrating  atherosclerotic ulcer of aorta ([place]) 10/22/2013   Small penetrating ulcer of descending thoracic aorta discovered on routine CTA   Prostate cancer ([place])    watchful waiting, no treatment so far   S/P Maze operation for atrial fibrillation 10/29/2013   Complete bilateral atrial lesion set using cryothermy and bipolar radiofrequency ablation with clipping of LA appendage via right mini thoracotomy   Shortness of breath    seen by Stanford Breed, & low energy     Past Surgical History:  Procedure Laterality Date   CARDIAC CATHETERIZATION  06/2013   CARDIOVERSION N/A 06/30/2013   Procedure: CARDIOVERSION;  Surgeon: [name], MD;  Location: [place];  Service: Cardiovascular;  Laterality: N/A;   CARDIOVERSION N/A 07/31/2013   Procedure: CARDIOVERSION;  Surgeon: Thayer Headings, MD;  Location: [place];  Service: Cardiovascular;  Laterality: N/A;   CARDIOVERSION N/A 03/19/2018   Procedure: CARDIOVERSION;  Surgeon: [name], MD;  Location: [place];  Service: Cardiovascular;  Laterality: N/A;   CARDIOVERSION N/A 07/24/2018   Procedure: CARDIOVERSION;  Surgeon: [name], MD;  Location[facility] ENDOSCOPY;  Service: Cardiovascular;  Laterality: N/A;   CLIPPING OF ATRIAL APPENDAGE  10/29/2013   Procedure: CLIPPING OF ATRIAL APPENDAGE;  Surgeon: Rexene Alberts, MD;  Location: [place];  Service: Open Heart Surgery;;   INTRAOPERATIVE TRANSESOPHAGEAL ECHOCARDIOGRAM N/A 10/29/2013   Procedure: INTRAOPERATIVE TRANSESOPHAGEAL ECHOCARDIOGRAM;  Surgeon: Rexene Alberts, MD;  Location: [place];  Service: Open Heart Surgery;  Laterality: N/A;   LEFT AND RIGHT HEART CATHETERIZATION WITH CORONARY ANGIOGRAM N/A 06/27/2013   Procedure: LEFT AND RIGHT HEART CATHETERIZATION WITH CORONARY ANGIOGRAM;  Surgeon: Jettie Booze, MD;  Location: [facility] CATH LAB;  Service: Cardiovascular;  Laterality: N/A;   MEDIAL PARTIAL KNEE REPLACEMENT Left 1990's?   MINIMALLY INVASIVE MAZE PROCEDURE N/A 10/29/2013   Procedure:  MINIMALLY INVASIVE MAZE PROCEDURE;  Surgeon: Rexene Alberts, MD;  Location: [place];  Service: Open Heart Surgery;  Laterality: N/A;   TEE WITHOUT CARDIOVERSION N/A 06/30/2013   Procedure: TRANSESOPHAGEAL ECHOCARDIOGRAM (TEE) ;  Surgeon: [name], MD;  Location: [place];  Service: Cardiovascular;  Laterality: N/A;   TRANSURETHRAL RESECTION OF PROSTATE  2010    Social History   Socioeconomic History   Marital status: Widowed    Spouse name: Not on file   Number of children: Not on file   Years of education: Not on file   Highest education level: Not on file  Occupational History   Not on file  Tobacco Use   Smoking status: Never   Smokeless tobacco: Never   Tobacco comments:    06/25/2013 "smoked very light; years and years ago"  Vaping Use   Vaping Use: Never used  Substance and Sexual Activity  Alcohol use: Yes    Alcohol/week: 14.0 standard drinks    Types: 7 Glasses of wine, 7 Shots of liquor per week   Drug use: No   Sexual activity: Not Currently  Other Topics Concern   Not on file  Social History Narrative   He lives in [place]. He is widowed. Wife passed of breast cancer last year. No children.    Social Determinants of Health   Financial Resource Strain: Not on file  Food Insecurity: Not on file  Transportation Needs: Not on file  Physical Activity: Not on file  Stress: Not on file  Social Connections: Not on file  Intimate Partner Violence: Not on file    Family History  Problem Relation Age of Onset   Healthy Father    Healthy Mother    Healthy Sister     ROS: Right knee pain but no fevers or chills, productive cough, hemoptysis, dysphasia, odynophagia, melena, hematochezia, dysuria, hematuria, rash, seizure activity, orthopnea, PND, pedal edema, claudication. Remaining systems are negative.  Physical Exam: Well-developed obese in no acute distress.  Skin is warm and dry.  HEENT is normal.  Neck is supple.  Chest is clear to auscultation  with normal expansion.  Cardiovascular exam is regular rate and rhythm.  Abdominal exam nontender or distended. No masses palpated. Extremities show no edema. neuro grossly intact  ECG-normal sinus rhythm at a rate of 61, low voltage, left axis deviation.  Personally reviewed  A/P  1 paroxysmal atrial fibrillation-patient remains in sinus today.  Continue amiodarone at present dose.  Check TSH, liver functions and chest x-ray.  We will also continue apixaban.  Check hemoglobin and renal function.  2 hypertension-patient's blood pressure is controlled.  Continue present medications and follow.  3 hyperlipidemia-continue statin.  4 history of nonischemic cardiomyopathy-LV function has improved on most recent echocardiogram.  Continue beta-blocker and ACE inhibitor.  5 chronic diastolic congestive heart failure-he is euvolemic.  Continue Lasix at present dose.  Check K and renal function.  6 coronary artery disease-patient denies chest pain.  Continue statin.  He is not on aspirin given need for anticoagulation.  7 penetrating atherosclerotic aortic ulcer-previously assessed by cardiothoracic surgery and no further imaging felt to be indicated.  Kirk Ruths, MD

## 2020-12-29 ENCOUNTER — Other Ambulatory Visit: Payer: Self-pay

## 2020-12-29 ENCOUNTER — Ambulatory Visit (INDEPENDENT_AMBULATORY_CARE_PROVIDER_SITE_OTHER): Payer: Medicare Other | Admitting: Cardiology

## 2020-12-29 ENCOUNTER — Encounter: Payer: Self-pay | Admitting: Cardiology

## 2020-12-29 VITALS — BP 142/80 | HR 61 | Ht 74.0 in | Wt 273.0 lb

## 2020-12-29 DIAGNOSIS — E7800 Pure hypercholesterolemia, unspecified: Secondary | ICD-10-CM | POA: Diagnosis not present

## 2020-12-29 DIAGNOSIS — I480 Paroxysmal atrial fibrillation: Secondary | ICD-10-CM | POA: Diagnosis not present

## 2020-12-29 DIAGNOSIS — I2510 Atherosclerotic heart disease of native coronary artery without angina pectoris: Secondary | ICD-10-CM | POA: Diagnosis not present

## 2020-12-29 DIAGNOSIS — I10 Essential (primary) hypertension: Secondary | ICD-10-CM | POA: Diagnosis not present

## 2020-12-29 DIAGNOSIS — I719 Aortic aneurysm of unspecified site, without rupture: Secondary | ICD-10-CM

## 2020-12-29 NOTE — Patient Instructions (Signed)
  Lab Work:  Your physician recommends that you return for lab work FASTING  If you have labs (blood work) drawn today and your tests are completely normal, you will receive your results only by: [place] (if you have [place]) OR A paper copy in the mail If you have any lab test that is abnormal or we need to change your treatment, we will call you to review the results.   Testing/Procedures:  A chest x-ray takes a picture of the organs and structures inside the chest, including the heart, lungs, and blood vessels. This test can show several things, including, whether the heart is enlarges; whether fluid is building up in the lungs; and whether pacemaker / defibrillator leads are still in place. 1ST FLOOR [place] OFFICE   Follow-Up: At [facility], you and your health needs are our priority.  As part of our continuing mission to provide you with exceptional heart care, we have created designated Provider Care Teams.  These Care Teams include your primary Cardiologist (physician) and Advanced Practice Providers (APPs -  Physician Assistants and Nurse Practitioners) who all work together to provide you with the care you need, when you need it.  We recommend signing up for the patient portal called "MyChart".  Sign up information is provided on this After Visit Summary.  MyChart is used to connect with patients for Virtual Visits (Telemedicine).  Patients are able to view lab/test results, encounter notes, upcoming appointments, etc.  Non-urgent messages can be sent to your provider as well.   To learn more about what you can do with MyChart, go to [email or web address].    Your next appointment:   12 month(s)  The format for your next appointment:   In Person  Provider:   Kirk Ruths, MD

## 2021-01-10 ENCOUNTER — Ambulatory Visit (INDEPENDENT_AMBULATORY_CARE_PROVIDER_SITE_OTHER): Payer: Medicare Other | Admitting: Podiatry

## 2021-01-10 ENCOUNTER — Encounter: Payer: Self-pay | Admitting: Podiatry

## 2021-01-10 ENCOUNTER — Other Ambulatory Visit: Payer: Self-pay

## 2021-01-10 DIAGNOSIS — D689 Coagulation defect, unspecified: Secondary | ICD-10-CM | POA: Diagnosis not present

## 2021-01-10 DIAGNOSIS — M79676 Pain in unspecified toe(s): Secondary | ICD-10-CM

## 2021-01-10 DIAGNOSIS — B351 Tinea unguium: Secondary | ICD-10-CM | POA: Diagnosis not present

## 2021-01-10 NOTE — Progress Notes (Signed)
This patient returns to my office for at risk foot care.  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  This patient is unable to cut nails himself since the patient cannot reach his nails.These nails are painful walking and wearing shoes. His right big toe nail plate is presently self-avulsing. This patient presents for at risk foot care today.  General Appearance  Alert, conversant and in no acute stress.  Vascular  Dorsalis pedis and posterior tibial  pulses are palpable  bilaterally.  Capillary return is within normal limits  bilaterally. Temperature is within normal limits  bilaterally.  Neurologic  Senn-Weinstein monofilament wire test within normal limits  bilaterally. Muscle power within normal limits bilaterally.  Nails Thick disfigured discolored nails with subungual debris  from hallux to fifth toes bilaterally. No evidence of bacterial infection or drainage bilaterally. Left great toenail is avulsing.  Orthopedic  No limitations of motion  feet .  No crepitus or effusions noted.  No bony pathology or digital deformities noted.  Skin  normotropic skin with no porokeratosis noted bilaterally.  No signs of infections or ulcers noted.     Onychomycosis  Pain in right toes  Pain in left toes  Consent was obtained for treatment procedures.   Mechanical debridement of nails 1-5  bilaterally performed with a nail nipper.  Filed with dremel without incident.  Self avulsed   hallux  toenail right foot and left foot  with no evidence of infection.   Return office visit   3 months                   Told patient to return for periodic foot care and evaluation due to potential at risk complications.   [name] DPM

## 2021-03-21 ENCOUNTER — Other Ambulatory Visit: Payer: Self-pay | Admitting: Cardiology

## 2021-03-21 DIAGNOSIS — I480 Paroxysmal atrial fibrillation: Secondary | ICD-10-CM

## 2021-03-23 ENCOUNTER — Ambulatory Visit: Payer: Medicare Other | Admitting: Podiatry

## 2021-03-25 ENCOUNTER — Ambulatory Visit: Payer: Medicare Other | Admitting: Podiatry

## 2021-03-30 ENCOUNTER — Ambulatory Visit (INDEPENDENT_AMBULATORY_CARE_PROVIDER_SITE_OTHER): Payer: Medicare Other | Admitting: Podiatry

## 2021-03-30 ENCOUNTER — Other Ambulatory Visit: Payer: Self-pay

## 2021-03-30 ENCOUNTER — Encounter: Payer: Self-pay | Admitting: Podiatry

## 2021-03-30 DIAGNOSIS — M79676 Pain in unspecified toe(s): Secondary | ICD-10-CM

## 2021-03-30 DIAGNOSIS — B351 Tinea unguium: Secondary | ICD-10-CM

## 2021-03-30 DIAGNOSIS — D689 Coagulation defect, unspecified: Secondary | ICD-10-CM | POA: Diagnosis not present

## 2021-03-30 NOTE — Progress Notes (Signed)
This patient returns to my office for at risk foot care.  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  This patient is unable to cut nails himself since the patient cannot reach his nails.These nails are painful walking and wearing shoes.  This patient presents for at risk foot care today.  General Appearance  Alert, conversant and in no acute stress.  Vascular  Dorsalis pedis and posterior tibial  pulses are palpable  bilaterally.  Capillary return is within normal limits  bilaterally. Temperature is within normal limits  bilaterally.  Neurologic  Senn-Weinstein monofilament wire test within normal limits  bilaterally. Muscle power within normal limits bilaterally.  Nails Thick disfigured discolored nails with subungual debris  from hallux to fifth toes bilaterally. No evidence of bacterial infection or drainage bilaterally.   Orthopedic  No limitations of motion  feet .  No crepitus or effusions noted.  No bony pathology or digital deformities noted.  Skin  normotropic skin with no porokeratosis noted bilaterally.  No signs of infections or ulcers noted.     Onychomycosis  Pain in right toes  Pain in left toes  Consent was obtained for treatment procedures.   Mechanical debridement of nails 1-5  bilaterally performed with a nail nipper.  Filed with dremel without incident.    Return office visit   3 months                   Told patient to return for periodic foot care and evaluation due to potential at risk complications.   Bayle Calvo DPM  

## 2021-04-01 ENCOUNTER — Ambulatory Visit: Payer: Medicare Other | Admitting: Podiatry

## 2021-04-21 ENCOUNTER — Encounter: Payer: Self-pay | Admitting: Family Medicine

## 2021-04-21 ENCOUNTER — Ambulatory Visit (INDEPENDENT_AMBULATORY_CARE_PROVIDER_SITE_OTHER): Payer: Medicare Other | Admitting: Family Medicine

## 2021-04-21 ENCOUNTER — Ambulatory Visit: Payer: Self-pay

## 2021-04-21 VITALS — BP 118/62 | Ht 74.0 in | Wt 270.0 lb

## 2021-04-21 DIAGNOSIS — M1711 Unilateral primary osteoarthritis, right knee: Secondary | ICD-10-CM | POA: Diagnosis not present

## 2021-04-21 NOTE — Patient Instructions (Signed)
Good to see you Please use ice as needed   Please send me a message in MyChart with any questions or updates.  Please see me back in 4-6 weeks.   --Dr. Saharsh Sterling  

## 2021-04-21 NOTE — Assessment & Plan Note (Signed)
Acute on chronic in nature.  Has no degenerative changes of the joint. -Counseled on home exercise therapy and supportive care. -Injection today. -Could consider genicular nerve blocks.

## 2021-04-21 NOTE — Progress Notes (Signed)
Jose Snyder - 81 y.o. male MRN [id number]  Date of birth: 1941-01-28  SUBJECTIVE:  Including CC & ROS.  No chief complaint on file.   Jose Snyder is a 81 y.o. male that is presenting with acute on chronic right knee pain.  He is having pain over the medial compartment.  Thinks he may have had a twisting episode.   Review of Systems See HPI   HISTORY: Past Medical, Surgical, Social, and Family History Reviewed & Updated per EMR.   Pertinent Historical Findings include:  Past Medical History:  Diagnosis Date   Anxiety    Arthritis    R hand- OA   Atrial Fibrillation  06/25/2013   CAD (coronary artery disease), native coronary artery 06/28/2013   a. LHC (06/27/13):  Mid LAD 40%, mid D1 75%, mid CFX 50-60%, mid RCA 50%, EF 20%, LVEDP 24 mmHg.   CHF (congestive heart failure) (HCC)    Chronic combined systolic and diastolic heart failure ([place]) 06/26/2013   a.  Echo (06/25/13):  EF 20-25%, diff HK, restrictive physio, mild MR, mod to severe LAE, mild RVE, mildly reduced RVSF, mod to severe RAE   Chronic kidney disease    Dyslipidemia 07/15/2013   Dysrhythmia    a-fib   GERD (gastroesophageal reflux disease)    Hypertension    Incidental pulmonary nodule 06/22/2014   Seen on CT angiogram of chest   NICM (nonischemic cardiomyopathy) with EF 20-25% 06/26/2013   Obesity    Penetrating atherosclerotic ulcer of aorta ([place]) 10/22/2013   Small penetrating ulcer of descending thoracic aorta discovered on routine CTA   Prostate cancer ([place])    watchful waiting, no treatment so far   S/P Maze operation for atrial fibrillation 10/29/2013   Complete bilateral atrial lesion set using cryothermy and bipolar radiofrequency ablation with clipping of LA appendage via right mini thoracotomy   Shortness of breath    seen by Stanford Breed, & low energy     Past Surgical History:  Procedure Laterality Date   CARDIAC CATHETERIZATION  06/2013   CARDIOVERSION N/A 06/30/2013   Procedure: CARDIOVERSION;  Surgeon: [name], MD;  Location: [place];  Service: Cardiovascular;  Laterality: N/A;   CARDIOVERSION N/A 07/31/2013   Procedure: CARDIOVERSION;  Surgeon: Thayer Headings, MD;  Location: [place];  Service: Cardiovascular;  Laterality: N/A;   CARDIOVERSION N/A 03/19/2018   Procedure: CARDIOVERSION;  Surgeon: [name], MD;  Location: [place];  Service: Cardiovascular;  Laterality: N/A;   CARDIOVERSION N/A 07/24/2018   Procedure: CARDIOVERSION;  Surgeon: [name], MD;  Location: [facility] ENDOSCOPY;  Service: Cardiovascular;  Laterality: N/A;   CLIPPING OF ATRIAL APPENDAGE  10/29/2013   Procedure: CLIPPING OF ATRIAL APPENDAGE;  Surgeon: Rexene Alberts, MD;  Location: [place];  Service: Open Heart Surgery;;   INTRAOPERATIVE TRANSESOPHAGEAL ECHOCARDIOGRAM N/A 10/29/2013   Procedure: INTRAOPERATIVE TRANSESOPHAGEAL ECHOCARDIOGRAM;  Surgeon: Rexene Alberts, MD;  Location: [place];  Service: Open Heart Surgery;  Laterality: N/A;   LEFT AND RIGHT HEART CATHETERIZATION WITH CORONARY ANGIOGRAM N/A 06/27/2013   Procedure: LEFT AND RIGHT HEART CATHETERIZATION WITH CORONARY ANGIOGRAM;  Surgeon: Jettie Booze, MD;  Location[facility] CATH LAB;  Service: Cardiovascular;  Laterality: N/A;   MEDIAL PARTIAL KNEE REPLACEMENT Left 1990's?   MINIMALLY INVASIVE MAZE PROCEDURE N/A 10/29/2013   Procedure: MINIMALLY INVASIVE MAZE PROCEDURE;  Surgeon: Rexene Alberts, MD;  Location: [place];  Service: Open Heart Surgery;  Laterality: N/A;   TEE WITHOUT CARDIOVERSION N/A 06/30/2013  Procedure: TRANSESOPHAGEAL ECHOCARDIOGRAM (TEE) ;  Surgeon: [name], MD;  Location: [facility] ENDOSCOPY;  Service: Cardiovascular;  Laterality: N/A;   TRANSURETHRAL RESECTION OF PROSTATE  2010     PHYSICAL EXAM:  VS: BP 118/62 (BP Location: Left Arm, Patient Position: Sitting)    Ht 6\' 2"  (1.88 m)    Wt 270 lb (122.5 kg)    BMI 34.67 kg/m  Physical Exam Gen: NAD, alert, cooperative with exam, well-appearing MSK:  Neurovascularly intact      Aspiration/Injection Procedure Note Jose Snyder June 04, 1940  Procedure: Injection Indications: Right knee pain  Procedure Details Consent: Risks of procedure as well as the alternatives and risks of each were explained to the (patient/caregiver).  Consent for procedure obtained. Time Out: Verified patient identification, verified procedure, site/side was marked, verified correct patient position, special equipment/implants available, medications/allergies/relevent history reviewed, required imaging and test results available.  Performed.  The area was cleaned with iodine and alcohol swabs.    The right knee superior lateral suprapatellar pouch was injected using 3 cc of 1% lidocaine on a 22-gauge 1-1/2 inch needle.  The syringe was switched and a mixture containing 5 cc's of 32 mg Zilretta and 4 cc's of 0.25% bupivacaine was injected.  Ultrasound was used. Images were obtained in long views showing the injection.    A sterile dressing was applied.  Patient did tolerate procedure well.     ASSESSMENT & PLAN:   OA (osteoarthritis) of knee Acute on chronic in nature.  Has no degenerative changes of the joint. -Counseled on home exercise therapy and supportive care. -Injection today. -Could consider genicular nerve blocks.

## 2021-05-12 ENCOUNTER — Telehealth: Payer: Self-pay | Admitting: *Deleted

## 2021-05-12 MED ORDER — METOPROLOL SUCCINATE ER 25 MG PO TB24: ORAL_TABLET | ORAL | 3 refills | Status: DC

## 2021-05-12 MED ORDER — APIXABAN 2.5 MG PO TABS: 2.5000 mg | ORAL_TABLET | Freq: Two times a day (BID) | ORAL | 3 refills | Status: AC

## 2021-05-12 NOTE — Telephone Encounter (Signed)
Spoke with pt, per dr Stanford Breed now that the patient is 81 years old, he was instructed to decrease eliquis to 2.5 mg twice daily. Prescription mailed to the patient for him to take to the [place]. ?

## 2021-06-08 ENCOUNTER — Encounter: Payer: Self-pay | Admitting: Podiatry

## 2021-06-08 ENCOUNTER — Ambulatory Visit (INDEPENDENT_AMBULATORY_CARE_PROVIDER_SITE_OTHER): Payer: Medicare Other | Admitting: Podiatry

## 2021-06-08 DIAGNOSIS — B351 Tinea unguium: Secondary | ICD-10-CM

## 2021-06-08 DIAGNOSIS — D689 Coagulation defect, unspecified: Secondary | ICD-10-CM | POA: Diagnosis not present

## 2021-06-08 DIAGNOSIS — M79676 Pain in unspecified toe(s): Secondary | ICD-10-CM

## 2021-06-08 NOTE — Progress Notes (Signed)
This patient returns to my office for at risk foot care.  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  This patient is unable to cut nails himself since the patient cannot reach his nails.These nails are painful walking and wearing shoes.  This patient presents for at risk foot care today.  General Appearance  Alert, conversant and in no acute stress.  Vascular  Dorsalis pedis and posterior tibial  pulses are palpable  bilaterally.  Capillary return is within normal limits  bilaterally. Temperature is within normal limits  bilaterally.  Neurologic  Senn-Weinstein monofilament wire test within normal limits  bilaterally. Muscle power within normal limits bilaterally.  Nails Thick disfigured discolored nails with subungual debris  from hallux to fifth toes bilaterally. No evidence of bacterial infection or drainage bilaterally.   Orthopedic  No limitations of motion  feet .  No crepitus or effusions noted.  No bony pathology or digital deformities noted.  Skin  normotropic skin with no porokeratosis noted bilaterally.  No signs of infections or ulcers noted.     Onychomycosis  Pain in right toes  Pain in left toes  Consent was obtained for treatment procedures.   Mechanical debridement of nails 1-5  bilaterally performed with a nail nipper.  Filed with dremel without incident.    Return office visit   3 months                   Told patient to return for periodic foot care and evaluation due to potential at risk complications.   Denina Rieger DPM  

## 2021-07-18 ENCOUNTER — Telehealth: Payer: Self-pay

## 2021-07-18 ENCOUNTER — Telehealth: Payer: Self-pay | Admitting: Cardiology

## 2021-07-18 NOTE — Telephone Encounter (Signed)
Spoke with pt, Follow up scheduled  

## 2021-07-18 NOTE — Telephone Encounter (Signed)
Informed patient that Dr. Stanford Breed wanted patient scheduled for f/u visit. I informed patient that I scheduled him for 5/25 8am at [facility]. He agreed to come to [place] for the appointment. Also advised patient that if SOB worsens, to go to the ED. He voiced understanding

## 2021-07-18 NOTE — Telephone Encounter (Signed)
Informed patient that Dr. Stanford Breed wanted patient scheduled for f/u visit. I informed patient that I scheduled him for 5/25 8am at [facility]. He agreed to come to [place] for the appointment. Also advised patient that if SOB worsens, to go to the ED. He voiced understanding.

## 2021-07-18 NOTE — Telephone Encounter (Signed)
Patient reports increasing SOB over the past 1-2 weeks. The SOB is affecting his sleep. When he walks to the mailbox 75 ft, he has to stop and rest because of SOB and dizziness. He denies chest pain. He stated he has some chest congestion with productive cough of clear phlegm. His BP monitor is not working. He has an appointment in [facility] on 8/23, but would like an earlier one if available Please advise on SOB.

## 2021-07-18 NOTE — Telephone Encounter (Signed)
Pt c/o Shortness Of Breath: STAT if SOB developed within the last 24 hours or pt is noticeably SOB on the phone  1. Are you currently SOB (can you hear that pt is SOB on the phone)? No  2. How long have you been experiencing SOB? 2 weeks  3. Are you SOB when sitting or when up moving around? Moving around  4. Are you currently experiencing any other symptoms? Tired can't sleep, shaky

## 2021-07-18 NOTE — Progress Notes (Unsigned)
HPI:FU atrial fibrillation. Echo 4/15 EF 20-25%. LHC demonstrated mod non-obs CAD. Most significant lesion was a mid D1 with 75% stenosis. DCM was felt to be out of proportion to CAD (NICM). Had DCCV on amiodarone but did not hold sinus. He was seen by Dr. Rayann Heman and referred to Dr. Roxy Manns for surgical maze. Carotid Dopplers August 2015 showed 1-39% bilateral stenosis. Patient underwent maze procedure with clipping of left atrial appendage on October 29, 2013. CTA April 2018 showed focal saccular outpouching of the distal descending thoracic aorta with no intramural hematoma or inflammatory changes.  Findings felt possibly early ulcer formation and not a penetrating ulcer per se.  Images were reviewed by Dr. Roxy Manns and no further follow-up recommended. Echocardiogram October 2019 showed normal LV function, moderate left ventricular hypertrophy, mildly dilated ascending aorta and mild mitral regurgitation. At previous office visit May 2020 patient noted to be in atrial flutter.  Had successful cardioversion Jul 24, 2018.  Contacted the office with increasing dyspnea on exertion and added to my schedule today.  Since last seen, he recently has noticed increased dyspnea on exertion particularly over the past 2 weeks.  He denies orthopnea but notes that he has had PND.  He has chronic bilateral lower extremity edema that he states is not worse.  He has gained 7 pounds in the past 4 weeks.  He denies chest pain or syncope.  No fevers, chills or hemoptysis.  Cough productive of whitish sputum.  Current Outpatient Medications  Medication Sig Dispense Refill   amiodarone (PACERONE) 200 MG tablet TAKE ONE (1) TABLET BY MOUTH EVERY DAY 90 tablet 1   amLODipine (NORVASC) 10 MG tablet Take 1 tablet (10 mg total) by mouth daily at 6 PM. 90 tablet 3   apixaban (ELIQUIS) 2.5 MG TABS tablet Take 1 tablet (2.5 mg total) by mouth 2 (two) times daily. 180 tablet 3   atorvastatin (LIPITOR) 80 MG tablet Take 1 tablet (80  mg total) by mouth daily with breakfast. 30 tablet 12   chlorhexidine (PERIDEX) 0.12 % solution RINSE 1/2 OUNCE (15ML) BY MOUTH TWICE A DAY AS DIRECTED BY YOUR MEDICAL PROVIDER     Cholecalciferol 50 MCG (2000 UT) TABS Take 1 tablet by mouth daily.     docusate sodium (COLACE) 100 MG capsule Take 400 mg by mouth at bedtime as needed for mild constipation.      furosemide (LASIX) 40 MG tablet Take 40 mg by mouth daily.     HYDROcodone-acetaminophen (NORCO/VICODIN) 5-325 MG tablet Take 1 tablet by mouth every 8 (eight) hours as needed. 15 tablet 0   ibuprofen (ADVIL,MOTRIN) 200 MG tablet Take 400 mg by mouth every 8 (eight) hours as needed for headache or moderate pain.      lisinopril (PRINIVIL,ZESTRIL) 40 MG tablet Take 40 mg by mouth daily.     Melatonin 3 MG CAPS Take 3 mg by mouth at bedtime.     metoprolol succinate (TOPROL-XL) 200 MG 24 hr tablet Take 1 tablet (200 mg total) by mouth daily. Take with or immediately following a meal. 90 tablet 3   metoprolol succinate (TOPROL-XL) 25 MG 24 hr tablet TAKE ONE (1) TABLET BY MOUTH EVERY DAY 90 tablet 3   omeprazole (PRILOSEC) 20 MG capsule Take 20 mg by mouth daily.     temazepam (RESTORIL) 15 MG capsule Take 15 mg by mouth at bedtime as needed for sleep.     Current Facility-Administered Medications  Medication Dose Route Frequency Provider  Last Rate Last Admin   sodium chloride flush (NS) 0.9 % injection 3 mL  3 mL Intravenous Q12H Lelon Perla, MD         Past Medical History:  Diagnosis Date   Anxiety    Arthritis    R hand- OA   Atrial Fibrillation  06/25/2013   CAD (coronary artery disease), native coronary artery 06/28/2013   a. LHC (06/27/13):  Mid LAD 40%, mid D1 75%, mid CFX 50-60%, mid RCA 50%, EF 20%, LVEDP 24 mmHg.   CHF (congestive heart failure) (HCC)    Chronic combined systolic and diastolic heart failure ([place]) 06/26/2013   a.  Echo (06/25/13):  EF 20-25%, diff HK, restrictive physio, mild MR, mod to severe LAE, mild RVE,  mildly reduced RVSF, mod to severe RAE   Chronic kidney disease    Dyslipidemia 07/15/2013   Dysrhythmia    a-fib   GERD (gastroesophageal reflux disease)    Hypertension    Incidental pulmonary nodule 06/22/2014   Seen on CT angiogram of chest   NICM (nonischemic cardiomyopathy) with EF 20-25% 06/26/2013   Obesity    Penetrating atherosclerotic ulcer of aorta ([place]) 10/22/2013   Small penetrating ulcer of descending thoracic aorta discovered on routine CTA   Prostate cancer ([place])    watchful waiting, no treatment so far   S/P Maze operation for atrial fibrillation 10/29/2013   Complete bilateral atrial lesion set using cryothermy and bipolar radiofrequency ablation with clipping of LA appendage via right mini thoracotomy   Shortness of breath    seen by Stanford Breed, & low energy     Past Surgical History:  Procedure Laterality Date   CARDIAC CATHETERIZATION  06/2013   CARDIOVERSION N/A 06/30/2013   Procedure: CARDIOVERSION;  Surgeon: [name], MD;  Location: [place];  Service: Cardiovascular;  Laterality: N/A;   CARDIOVERSION N/A 07/31/2013   Procedure: CARDIOVERSION;  Surgeon: Thayer Headings, MD;  Location: [place];  Service: Cardiovascular;  Laterality: N/A;   CARDIOVERSION N/A 03/19/2018   Procedure: CARDIOVERSION;  Surgeon: [name], MD;  Location: [place];  Service: Cardiovascular;  Laterality: N/A;   CARDIOVERSION N/A 07/24/2018   Procedure: CARDIOVERSION;  Surgeon: [name], MD;  Location: [facility] ENDOSCOPY;  Service: Cardiovascular;  Laterality: N/A;   CLIPPING OF ATRIAL APPENDAGE  10/29/2013   Procedure: CLIPPING OF ATRIAL APPENDAGE;  Surgeon: Rexene Alberts, MD;  Location: [place];  Service: Open Heart Surgery;;   INTRAOPERATIVE TRANSESOPHAGEAL ECHOCARDIOGRAM N/A 10/29/2013   Procedure: INTRAOPERATIVE TRANSESOPHAGEAL ECHOCARDIOGRAM;  Surgeon: Rexene Alberts, MD;  Location: [place];  Service: Open Heart Surgery;  Laterality: N/A;   LEFT AND RIGHT HEART  CATHETERIZATION WITH CORONARY ANGIOGRAM N/A 06/27/2013   Procedure: LEFT AND RIGHT HEART CATHETERIZATION WITH CORONARY ANGIOGRAM;  Surgeon: Jettie Booze, MD;  Location: [facility] CATH LAB;  Service: Cardiovascular;  Laterality: N/A;   MEDIAL PARTIAL KNEE REPLACEMENT Left 1990's?   MINIMALLY INVASIVE MAZE PROCEDURE N/A 10/29/2013   Procedure: MINIMALLY INVASIVE MAZE PROCEDURE;  Surgeon: Rexene Alberts, MD;  Location: [place];  Service: Open Heart Surgery;  Laterality: N/A;   TEE WITHOUT CARDIOVERSION N/A 06/30/2013   Procedure: TRANSESOPHAGEAL ECHOCARDIOGRAM (TEE) ;  Surgeon: [name], MD;  Location: [place];  Service: Cardiovascular;  Laterality: N/A;   TRANSURETHRAL RESECTION OF PROSTATE  2010    Social History   Socioeconomic History   Marital status: Widowed    Spouse name: Not on file   Number of children: Not  on file   Years of education: Not on file   Highest education level: Not on file  Occupational History   Not on file  Tobacco Use   Smoking status: Never   Smokeless tobacco: Never   Tobacco comments:    06/25/2013 "smoked very light; years and years ago"  Vaping Use   Vaping Use: Never used  Substance and Sexual Activity   Alcohol use: Yes    Alcohol/week: 14.0 standard drinks    Types: 7 Glasses of wine, 7 Shots of liquor per week   Drug use: No   Sexual activity: Not Currently  Other Topics Concern   Not on file  Social History Narrative   He lives in [place]. He is widowed. Wife passed of breast cancer last year. No children.    Social Determinants of Health   Financial Resource Strain: Not on file  Food Insecurity: Not on file  Transportation Needs: Not on file  Physical Activity: Not on file  Stress: Not on file  Social Connections: Not on file  Intimate Partner Violence: Not on file    Family History  Problem Relation Age of Onset   Healthy Father    Healthy Mother    Healthy Sister     ROS: no fevers or chills, hemoptysis, dysphasia,  odynophagia, melena, hematochezia, dysuria, hematuria, rash, seizure activity, orthopnea, PND, claudication. Remaining systems are negative.  Physical Exam: Well-developed obese in no acute distress.  Skin is warm and dry.  HEENT is normal.  Neck is supple.  Chest is clear to auscultation with normal expansion.  Cardiovascular exam is regular rate and rhythm.  Abdominal exam nontender or distended. No masses palpated. Extremities show 1+ edema. neuro grossly intact  ECG-normal sinus rhythm at a rate of 81, left axis deviation, no ST changes.  Personally reviewed  A/P  1 paroxysmal atrial fibrillation-patient is in sinus rhythm this morning.  Continue amiodarone and apixaban.  Check TSH, liver functions and chest x-ray.  Check hemoglobin and renal function.  2 hypertension-blood pressure controlled.  Continue present medical regimen.  3 hyperlipidemia-continue statin.  4 history of nonischemic cardiomyopathy-LV function improved on most recent echocardiogram.  We will repeat study given increasing dyspnea.  Continue beta-blocker and ACE inhibitor.  5 chronic diastolic congestive heart failure-he appears to be volume overloaded on examination and based on history.  Check BNP.  We will discontinue Lasix and instead treat with Demadex 40 mg daily.  He does have baseline renal insufficiency and we will need to follow this closely.  Check potassium and renal function in 1 week.  Needs fluid restriction and low-sodium diet.  We will consider addition of Farxiga in the future if renal function stable after increasing diuretics.  6 coronary artery disease-he denies chest pain.  Continue statin.  No aspirin given need for apixaban.  7 penetrating atherosclerotic aortic ulcer-continue statin.  Previously seen by cardiothoracic surgery and medical therapy recommended.  8 chronic stage III kidney disease-follow renal function closely with diuresis.  Kirk Ruths, MD

## 2021-07-18 NOTE — Telephone Encounter (Addendum)
Informed patient that Dr. Stanford Breed wanted patient scheduled for f/u visit. I informed patient that I scheduled him for 5/25 8am at [facility] He agreed to come to [facility] for the appointment. Also advised patient that if SOB worsens, to go to the ED. He voiced understanding.

## 2021-07-19 ENCOUNTER — Ambulatory Visit (INDEPENDENT_AMBULATORY_CARE_PROVIDER_SITE_OTHER): Payer: Medicare Other | Admitting: Cardiology

## 2021-07-19 ENCOUNTER — Other Ambulatory Visit: Payer: Self-pay | Admitting: *Deleted

## 2021-07-19 ENCOUNTER — Encounter: Payer: Self-pay | Admitting: Cardiology

## 2021-07-19 VITALS — BP 122/56 | HR 81 | Ht 74.0 in | Wt 272.0 lb

## 2021-07-19 DIAGNOSIS — R0602 Shortness of breath: Secondary | ICD-10-CM | POA: Diagnosis not present

## 2021-07-19 DIAGNOSIS — I10 Essential (primary) hypertension: Secondary | ICD-10-CM

## 2021-07-19 DIAGNOSIS — E7800 Pure hypercholesterolemia, unspecified: Secondary | ICD-10-CM | POA: Diagnosis not present

## 2021-07-19 DIAGNOSIS — I480 Paroxysmal atrial fibrillation: Secondary | ICD-10-CM

## 2021-07-19 DIAGNOSIS — I5033 Acute on chronic diastolic (congestive) heart failure: Secondary | ICD-10-CM | POA: Diagnosis not present

## 2021-07-19 DIAGNOSIS — I719 Aortic aneurysm of unspecified site, without rupture: Secondary | ICD-10-CM | POA: Diagnosis not present

## 2021-07-19 DIAGNOSIS — I2510 Atherosclerotic heart disease of native coronary artery without angina pectoris: Secondary | ICD-10-CM | POA: Diagnosis not present

## 2021-07-19 LAB — COMPREHENSIVE METABOLIC PANEL
ALT: 17 IU/L (ref 0–44)
AST: 24 IU/L (ref 0–40)
Albumin/Globulin Ratio: 1.7 (ref 1.2–2.2)
Albumin: 4.3 g/dL (ref 3.7–4.7)
Alkaline Phosphatase: 86 IU/L (ref 44–121)
BUN/Creatinine Ratio: 10 (ref 10–24)
BUN: 13 mg/dL (ref 8–27)
Bilirubin Total: 0.3 mg/dL (ref 0.0–1.2)
CO2: 23 mmol/L (ref 20–29)
Calcium: 9.4 mg/dL (ref 8.6–10.2)
Chloride: 98 mmol/L (ref 96–106)
Creatinine, Ser: 1.36 mg/dL — ABNORMAL HIGH (ref 0.76–1.27)
Globulin, Total: 2.5 g/dL (ref 1.5–4.5)
Glucose: 113 mg/dL — ABNORMAL HIGH (ref 70–99)
Potassium: 4.8 mmol/L (ref 3.5–5.2)
Sodium: 137 mmol/L (ref 134–144)
Total Protein: 6.8 g/dL (ref 6.0–8.5)
eGFR: 53 mL/min/{1.73_m2} — ABNORMAL LOW (ref >59)

## 2021-07-19 LAB — LIPID PANEL
Chol/HDL Ratio: 3.9 ratio (ref 0.0–5.0)
Cholesterol, Total: 141 mg/dL (ref 100–199)
HDL: 36 mg/dL — ABNORMAL LOW (ref >39)
LDL Chol Calc (NIH): 72 mg/dL (ref 0–99)
Triglycerides: 195 mg/dL — ABNORMAL HIGH (ref 0–149)
VLDL Cholesterol Cal: 33 mg/dL (ref 5–40)

## 2021-07-19 LAB — CBC
Hematocrit: 32.9 % — ABNORMAL LOW (ref 37.5–51.0)
Hemoglobin: 10.2 g/dL — ABNORMAL LOW (ref 13.0–17.7)
MCH: 25.8 pg — ABNORMAL LOW (ref 26.6–33.0)
MCHC: 31 g/dL — ABNORMAL LOW (ref 31.5–35.7)
MCV: 83 fL (ref 79–97)
Platelets: 270 10*3/uL (ref 150–450)
RBC: 3.96 x10E6/uL — ABNORMAL LOW (ref 4.14–5.80)
RDW: 14.5 % (ref 11.6–15.4)
WBC: 6.2 10*3/uL (ref 3.4–10.8)

## 2021-07-19 LAB — TSH: TSH: 3.11 u[IU]/mL (ref 0.450–4.500)

## 2021-07-19 MED ORDER — TORSEMIDE 20 MG PO TABS: 40.0000 mg | ORAL_TABLET | Freq: Every day | ORAL | 3 refills | Status: DC

## 2021-07-19 NOTE — Patient Instructions (Addendum)
Medication Instructions:   STOP FUROSEMIDE  START TORSEMIDE 40 MG ONCE DAILY  *If you need a refill on your cardiac medications before your next appointment, please call your pharmacy*  Lab:  Your physician recommends that you return for lab work in: ONE WEEK-DO NOT NEED TO FAST  Testing/Procedures:  Your physician has requested that you have an echocardiogram. Echocardiography is a painless test that uses sound waves to create images of your heart. It provides your doctor with information about the size and shape of your heart and how well your heart's chambers and valves are working. This procedure takes approximately one hour. There are no restrictions for this procedure. [place] [facility]-1 ST FLOOR IMAGING DEPARTMENT  A chest x-ray takes a picture of the organs and structures inside the chest, including the heart, lungs, and blood vessels. This test can show several things, including, whether the heart is enlarges; whether fluid is building up in the lungs; and whether pacemaker / defibrillator leads are still in place. [place] [facility]   Follow-Up: At [facility], you and your health needs are our priority.  As part of our continuing mission to provide you with exceptional heart care, we have created designated Provider Care Teams.  These Care Teams include your primary Cardiologist (physician) and Advanced Practice Providers (APPs -  Physician Assistants and Nurse Practitioners) who all work together to provide you with the care you need, when you need it.  We recommend signing up for the patient portal called "MyChart".  Sign up information is provided on this After Visit Summary.  MyChart is used to connect with patients for Virtual Visits (Telemedicine).  Patients are able to view lab/test results, encounter notes, upcoming appointments, etc.  Non-urgent messages can be sent to your provider as well.   To learn more about what you can do with MyChart, go to  [email or web address].    Your next appointment:   4 week(s)  The format for your next appointment:   In Person  Provider:   Kirk Ruths, MD      Important Information About Sugar

## 2021-07-20 ENCOUNTER — Other Ambulatory Visit: Payer: Self-pay | Admitting: *Deleted

## 2021-07-20 LAB — PRO B NATRIURETIC PEPTIDE: NT-Pro BNP: 541 pg/mL — ABNORMAL HIGH (ref 0–486)

## 2021-07-20 MED ORDER — TORSEMIDE 20 MG PO TABS: 40.0000 mg | ORAL_TABLET | Freq: Every day | ORAL | 3 refills | Status: AC

## 2021-07-21 ENCOUNTER — Ambulatory Visit: Payer: Medicare Other | Admitting: Cardiology

## 2021-08-02 ENCOUNTER — Ambulatory Visit (HOSPITAL_BASED_OUTPATIENT_CLINIC_OR_DEPARTMENT_OTHER)
Admission: RE | Admit: 2021-08-02 | Discharge: 2021-08-02 | Disposition: A | Discharge status: Home / Self Care | Payer: Medicare Other | Source: Ambulatory Visit | Attending: Cardiology | Admitting: Cardiology

## 2021-08-02 DIAGNOSIS — I480 Paroxysmal atrial fibrillation: Secondary | ICD-10-CM | POA: Insufficient documentation

## 2021-08-02 DIAGNOSIS — R0602 Shortness of breath: Secondary | ICD-10-CM

## 2021-08-02 DIAGNOSIS — J948 Other specified pleural conditions: Secondary | ICD-10-CM | POA: Diagnosis not present

## 2021-08-02 LAB — ECHOCARDIOGRAM COMPLETE
AR max vel: 2.66 cm2
AV Area VTI: 2.58 cm2
AV Area mean vel: 2.32 cm2
AV Mean grad: 5 mmHg
AV Peak grad: 8.8 mmHg
Ao pk vel: 1.48 m/s
Area-P 1/2: 3.53 cm2
S' Lateral: 3 cm

## 2021-08-02 NOTE — Progress Notes (Signed)
  Echocardiogram 2D Echocardiogram has been performed.  Merrie Roof F 08/02/2021, 10:41 AM

## 2021-08-04 ENCOUNTER — Telehealth: Payer: Self-pay | Admitting: *Deleted

## 2021-08-04 DIAGNOSIS — R0602 Shortness of breath: Secondary | ICD-10-CM | POA: Diagnosis not present

## 2021-08-04 NOTE — Telephone Encounter (Signed)
Spoke with pt, aware of echo and cxr results. He reports his swelling has gotten better and his weight is down some. He reports continued SOB with exertion within the home and esp walking to the mailbox. He also reports extreme fatigue and lack of energy. He feels this maybe related to the metoprolol. This has gotten worse since the addition of the 25 mg at night. His blood pressure is running low, 104/47 and he does have some dizziness with standing. He is very thirsty and is trying to watch his fluid intake but is crunching ice. He had his lab work drawn this morning. Aware will forward for dr Stanford Breed to review.

## 2021-08-04 NOTE — Telephone Encounter (Signed)
Spoke with pt, aware to stop the evening 25 mg dose of metoprolol.

## 2021-08-05 ENCOUNTER — Encounter: Payer: Self-pay | Admitting: *Deleted

## 2021-08-05 LAB — BASIC METABOLIC PANEL
BUN/Creatinine Ratio: 10 (ref 10–24)
BUN: 16 mg/dL (ref 8–27)
CO2: 17 mmol/L — ABNORMAL LOW (ref 20–29)
Calcium: 9 mg/dL (ref 8.6–10.2)
Chloride: 102 mmol/L (ref 96–106)
Creatinine, Ser: 1.62 mg/dL — ABNORMAL HIGH (ref 0.76–1.27)
Glucose: 144 mg/dL — ABNORMAL HIGH (ref 70–99)
Potassium: 4.5 mmol/L (ref 3.5–5.2)
Sodium: 142 mmol/L (ref 134–144)
eGFR: 43 mL/min/{1.73_m2} — ABNORMAL LOW (ref >59)

## 2021-08-09 NOTE — Progress Notes (Unsigned)
HPI:FU atrial fibrillation. Echo 4/15 EF 20-25%. LHC demonstrated mod non-obs CAD. Most significant lesion was a mid D1 with 75% stenosis. DCM was felt to be out of proportion to CAD (NICM). Had DCCV on amiodarone but did not hold sinus. He was seen by Dr. Rayann Heman and referred to Dr. Roxy Manns for surgical maze. Carotid Dopplers August 2015 showed 1-39% bilateral stenosis. Patient underwent maze procedure with clipping of left atrial appendage on October 29, 2013. CTA April 2018 showed focal saccular outpouching of the distal descending thoracic aorta with no intramural hematoma or inflammatory changes.  Findings felt possibly early ulcer formation and not a penetrating ulcer per se.  Images were reviewed by Dr. Roxy Manns and no further follow-up recommended. Echocardiogram October 2019 showed normal LV function, moderate left ventricular hypertrophy, mildly dilated ascending aorta and mild mitral regurgitation. At previous office visit May 2020 patient noted to be in atrial flutter.  Had successful cardioversion Jul 24, 2018.  Recently seen with increasing dyspnea.  Follow-up echocardiogram June 2023 showed normal LV function, moderate left atrial enlargement, mildly dilated ascending aorta at 39 mm.  Chest x-ray June 2023 showed no active cardiopulmonary disease.  Hemoglobin was mildly decreased at 10.2.  proBNP 541.  Since last seen,   Current Outpatient Medications  Medication Sig Dispense Refill   amiodarone (PACERONE) 200 MG tablet TAKE ONE (1) TABLET BY MOUTH EVERY DAY 90 tablet 1   amLODipine (NORVASC) 10 MG tablet Take 1 tablet (10 mg total) by mouth daily at 6 PM. 90 tablet 3   apixaban (ELIQUIS) 2.5 MG TABS tablet Take 1 tablet (2.5 mg total) by mouth 2 (two) times daily. 180 tablet 3   atorvastatin (LIPITOR) 80 MG tablet Take 1 tablet (80 mg total) by mouth daily with breakfast. 30 tablet 12   chlorhexidine (PERIDEX) 0.12 % solution RINSE 1/2 OUNCE (15ML) BY MOUTH TWICE A DAY AS DIRECTED BY YOUR  MEDICAL PROVIDER     Cholecalciferol 50 MCG (2000 UT) TABS Take 1 tablet by mouth daily.     docusate sodium (COLACE) 100 MG capsule Take 400 mg by mouth at bedtime as needed for mild constipation.      HYDROcodone-acetaminophen (NORCO/VICODIN) 5-325 MG tablet Take 1 tablet by mouth every 8 (eight) hours as needed. 15 tablet 0   ibuprofen (ADVIL,MOTRIN) 200 MG tablet Take 400 mg by mouth every 8 (eight) hours as needed for headache or moderate pain.      lisinopril (PRINIVIL,ZESTRIL) 40 MG tablet Take 40 mg by mouth daily.     Melatonin 3 MG CAPS Take 3 mg by mouth at bedtime.     metoprolol succinate (TOPROL-XL) 200 MG 24 hr tablet Take 1 tablet (200 mg total) by mouth daily. Take with or immediately following a meal. 90 tablet 3   omeprazole (PRILOSEC) 20 MG capsule Take 20 mg by mouth daily.     temazepam (RESTORIL) 15 MG capsule Take 15 mg by mouth at bedtime as needed for sleep.     torsemide (DEMADEX) 20 MG tablet Take 2 tablets (40 mg total) by mouth daily. 180 tablet 3   Current Facility-Administered Medications  Medication Dose Route Frequency Provider Last Rate Last Admin   sodium chloride flush (NS) 0.9 % injection 3 mL  3 mL Intravenous Q12H Stanford Breed Denice Bors, MD         Past Medical History:  Diagnosis Date   Anxiety    Arthritis    R hand- OA   Atrial  Fibrillation  06/25/2013   CAD (coronary artery disease), native coronary artery 06/28/2013   a. LHC (06/27/13):  Mid LAD 40%, mid D1 75%, mid CFX 50-60%, mid RCA 50%, EF 20%, LVEDP 24 mmHg.   CHF (congestive heart failure) (HCC)    Chronic combined systolic and diastolic heart failure ([place]) 06/26/2013   a.  Echo (06/25/13):  EF 20-25%, diff HK, restrictive physio, mild MR, mod to severe LAE, mild RVE, mildly reduced RVSF, mod to severe RAE   Chronic kidney disease    Dyslipidemia 07/15/2013   Dysrhythmia    a-fib   GERD (gastroesophageal reflux disease)    Hypertension    Incidental pulmonary nodule 06/22/2014   Seen on CT  angiogram of chest   NICM (nonischemic cardiomyopathy) with EF 20-25% 06/26/2013   Obesity    Penetrating atherosclerotic ulcer of aorta ([place]) 10/22/2013   Small penetrating ulcer of descending thoracic aorta discovered on routine CTA   Prostate cancer ([place])    watchful waiting, no treatment so far   S/P Maze operation for atrial fibrillation 10/29/2013   Complete bilateral atrial lesion set using cryothermy and bipolar radiofrequency ablation with clipping of LA appendage via right mini thoracotomy   Shortness of breath    seen by Stanford Breed, & low energy     Past Surgical History:  Procedure Laterality Date   CARDIAC CATHETERIZATION  06/2013   CARDIOVERSION N/A 06/30/2013   Procedure: CARDIOVERSION;  Surgeon: [name], MD;  Location: [place];  Service: Cardiovascular;  Laterality: N/A;   CARDIOVERSION N/A 07/31/2013   Procedure: CARDIOVERSION;  Surgeon: Thayer Headings, MD;  Location: [place];  Service: Cardiovascular;  Laterality: N/A;   CARDIOVERSION N/A 03/19/2018   Procedure: CARDIOVERSION;  Surgeon: [name], MD;  Location: [place];  Service: Cardiovascular;  Laterality: N/A;   CARDIOVERSION N/A 07/24/2018   Procedure: CARDIOVERSION;  Surgeon: [name], MD;  Location: [facility] ENDOSCOPY;  Service: Cardiovascular;  Laterality: N/A;   CLIPPING OF ATRIAL APPENDAGE  10/29/2013   Procedure: CLIPPING OF ATRIAL APPENDAGE;  Surgeon: Rexene Alberts, MD;  Location: [place];  Service: Open Heart Surgery;;   INTRAOPERATIVE TRANSESOPHAGEAL ECHOCARDIOGRAM N/A 10/29/2013   Procedure: INTRAOPERATIVE TRANSESOPHAGEAL ECHOCARDIOGRAM;  Surgeon: Rexene Alberts, MD;  Location: [place];  Service: Open Heart Surgery;  Laterality: N/A;   LEFT AND RIGHT HEART CATHETERIZATION WITH CORONARY ANGIOGRAM N/A 06/27/2013   Procedure: LEFT AND RIGHT HEART CATHETERIZATION WITH CORONARY ANGIOGRAM;  Surgeon: Jettie Booze, MD;  Location: [facility] CATH LAB;  Service: Cardiovascular;  Laterality: N/A;   MEDIAL  PARTIAL KNEE REPLACEMENT Left 1990's?   MINIMALLY INVASIVE MAZE PROCEDURE N/A 10/29/2013   Procedure: MINIMALLY INVASIVE MAZE PROCEDURE;  Surgeon: Rexene Alberts, MD;  Location: [place];  Service: Open Heart Surgery;  Laterality: N/A;   TEE WITHOUT CARDIOVERSION N/A 06/30/2013   Procedure: TRANSESOPHAGEAL ECHOCARDIOGRAM (TEE) ;  Surgeon: [name], MD;  Location: [place];  Service: Cardiovascular;  Laterality: N/A;   TRANSURETHRAL RESECTION OF PROSTATE  2010    Social History   Socioeconomic History   Marital status: Widowed    Spouse name: Not on file   Number of children: Not on file   Years of education: Not on file   Highest education level: Not on file  Occupational History   Not on file  Tobacco Use   Smoking status: Never   Smokeless tobacco: Never   Tobacco comments:    06/25/2013 "smoked very light; years and years ago"  Vaping Use   Vaping Use: Never used  Substance and Sexual Activity   Alcohol use: Yes    Alcohol/week: 14.0 standard drinks of alcohol    Types: 7 Glasses of wine, 7 Shots of liquor per week   Drug use: No   Sexual activity: Not Currently  Other Topics Concern   Not on file  Social History Narrative   He lives in [place]. He is widowed. Wife passed of breast cancer last year. No children.    Social Determinants of Health   Financial Resource Strain: Not on file  Food Insecurity: Not on file  Transportation Needs: Not on file  Physical Activity: Not on file  Stress: Not on file  Social Connections: Not on file  Intimate Partner Violence: Not on file    Family History  Problem Relation Age of Onset   Healthy Father    Healthy Mother    Healthy Sister     ROS: no fevers or chills, productive cough, hemoptysis, dysphasia, odynophagia, melena, hematochezia, dysuria, hematuria, rash, seizure activity, orthopnea, PND, pedal edema, claudication. Remaining systems are negative.  Physical Exam: Well-developed well-nourished in no acute  distress.  Skin is warm and dry.  HEENT is normal.  Neck is supple.  Chest is clear to auscultation with normal expansion.  Cardiovascular exam is regular rate and rhythm.  Abdominal exam nontender or distended. No masses palpated. Extremities show no edema. neuro grossly intact  ECG- personally reviewed  A/P  1 paroxysmal atrial fibrillation-patient remains in sinus rhythm.  We will continue amiodarone and apixaban.  2 history of nonischemic cardiomyopathy-LV function normal on most recent echocardiogram.  3 chronic diastolic congestive heart failure-diuretics recently increased for volume excess.  We will continue present dose.  His symptoms have improved.  Add Farxiga 10 mg daily.  4 hypertension-blood pressure controlled.  Continue present medications and follow.  5 hyperlipidemia-continue statin.  6 coronary artery disease-continue statin.  No aspirin given need for anticoagulation.  7 penetrating atherosclerotic aortic ulcer-continue statin.  He was previously seen by cardiothoracic surgery and medical therapy recommended.  8 chronic stage III kidney disease-continue to follow renal function closely.  Kirk Ruths, MD

## 2021-08-23 ENCOUNTER — Encounter: Payer: Self-pay | Admitting: Cardiology

## 2021-08-23 ENCOUNTER — Ambulatory Visit (INDEPENDENT_AMBULATORY_CARE_PROVIDER_SITE_OTHER): Payer: Medicare Other | Admitting: Cardiology

## 2021-08-23 VITALS — BP 128/60 | HR 83 | Ht 74.0 in | Wt 276.2 lb

## 2021-08-23 DIAGNOSIS — I10 Essential (primary) hypertension: Secondary | ICD-10-CM

## 2021-08-23 DIAGNOSIS — E7800 Pure hypercholesterolemia, unspecified: Secondary | ICD-10-CM | POA: Diagnosis not present

## 2021-08-23 DIAGNOSIS — I2510 Atherosclerotic heart disease of native coronary artery without angina pectoris: Secondary | ICD-10-CM | POA: Diagnosis not present

## 2021-08-23 DIAGNOSIS — I719 Aortic aneurysm of unspecified site, without rupture: Secondary | ICD-10-CM

## 2021-08-23 DIAGNOSIS — I480 Paroxysmal atrial fibrillation: Secondary | ICD-10-CM | POA: Diagnosis not present

## 2021-08-23 DIAGNOSIS — I5032 Chronic diastolic (congestive) heart failure: Secondary | ICD-10-CM

## 2021-08-23 MED ORDER — DAPAGLIFLOZIN PROPANEDIOL 10 MG PO TABS: 10.0000 mg | ORAL_TABLET | Freq: Every day | ORAL | 3 refills | Status: DC

## 2021-08-26 ENCOUNTER — Ambulatory Visit (INDEPENDENT_AMBULATORY_CARE_PROVIDER_SITE_OTHER): Payer: Medicare Other | Admitting: Family Medicine

## 2021-08-26 ENCOUNTER — Encounter: Payer: Self-pay | Admitting: Family Medicine

## 2021-08-26 ENCOUNTER — Ambulatory Visit: Payer: Self-pay

## 2021-08-26 VITALS — BP 110/58 | Ht 74.0 in | Wt 276.0 lb

## 2021-08-26 DIAGNOSIS — M1711 Unilateral primary osteoarthritis, right knee: Secondary | ICD-10-CM

## 2021-08-26 MED ORDER — TRIAMCINOLONE ACETONIDE 32 MG IX SRER
32.0000 mg | Freq: Once | INTRA_ARTICULAR | Status: AC
  Administered 2021-08-26: 32 mg via INTRA_ARTICULAR

## 2021-08-26 NOTE — Assessment & Plan Note (Signed)
Acute on chronic in nature.  The pain feels similar to his previous pain from his degenerative changes.  Has done well with previous Zilretta injection. -Counseled on home exercise therapy and supportive care. -Zilretta injection today.

## 2021-08-26 NOTE — Patient Instructions (Signed)
Good to see you ?Please use ice as needed  ?Please send me a message in MyChart with any questions or updates.  ?Please see me back in 3 months.  ? ?--Dr. Avinash Maltos ? ?

## 2021-08-26 NOTE — Progress Notes (Signed)
Jose Snyder - 81 y.o. male MRN [id number]  Date of birth: 12-21-1940  SUBJECTIVE:  Including CC & ROS.  No chief complaint on file.   Jose Snyder is a 81 y.o. male that is presenting with acute right knee pain.  The pain feels similar to his previous pain.  Has done well with the previous injection.   Review of Systems See HPI   HISTORY: Past Medical, Surgical, Social, and Family History Reviewed & Updated per EMR.   Pertinent Historical Findings include:  Past Medical History:  Diagnosis Date   Anxiety    Arthritis    R hand- OA   Atrial Fibrillation  06/25/2013   CAD (coronary artery disease), native coronary artery 06/28/2013   a. LHC (06/27/13):  Mid LAD 40%, mid D1 75%, mid CFX 50-60%, mid RCA 50%, EF 20%, LVEDP 24 mmHg.   CHF (congestive heart failure) (HCC)    Chronic combined systolic and diastolic heart failure ([place]) 06/26/2013   a.  Echo (06/25/13):  EF 20-25%, diff HK, restrictive physio, mild MR, mod to severe LAE, mild RVE, mildly reduced RVSF, mod to severe RAE   Chronic kidney disease    Dyslipidemia 07/15/2013   Dysrhythmia    a-fib   GERD (gastroesophageal reflux disease)    Hypertension    Incidental pulmonary nodule 06/22/2014   Seen on CT angiogram of chest   NICM (nonischemic cardiomyopathy) with EF 20-25% 06/26/2013   Obesity    Penetrating atherosclerotic ulcer of aorta ([place]) 10/22/2013   Small penetrating ulcer of descending thoracic aorta discovered on routine CTA   Prostate cancer ([place])    watchful waiting, no treatment so far   S/P Maze operation for atrial fibrillation 10/29/2013   Complete bilateral atrial lesion set using cryothermy and bipolar radiofrequency ablation with clipping of LA appendage via right mini thoracotomy   Shortness of breath    seen by Stanford Breed, & low energy     Past Surgical History:  Procedure Laterality Date   CARDIAC CATHETERIZATION  06/2013   CARDIOVERSION N/A 06/30/2013   Procedure: CARDIOVERSION;  Surgeon: [name], MD;   Location: [place];  Service: Cardiovascular;  Laterality: N/A;   CARDIOVERSION N/A 07/31/2013   Procedure: CARDIOVERSION;  Surgeon: Thayer Headings, MD;  Location: [place];  Service: Cardiovascular;  Laterality: N/A;   CARDIOVERSION N/A 03/19/2018   Procedure: CARDIOVERSION;  Surgeon: [name], MD;  Location: [place];  Service: Cardiovascular;  Laterality: N/A;   CARDIOVERSION N/A 07/24/2018   Procedure: CARDIOVERSION;  Surgeon: [name], MD;  Location: [facility] ENDOSCOPY;  Service: Cardiovascular;  Laterality: N/A;   CLIPPING OF ATRIAL APPENDAGE  10/29/2013   Procedure: CLIPPING OF ATRIAL APPENDAGE;  Surgeon: Rexene Alberts, MD;  Location: [place];  Service: Open Heart Surgery;;   INTRAOPERATIVE TRANSESOPHAGEAL ECHOCARDIOGRAM N/A 10/29/2013   Procedure: INTRAOPERATIVE TRANSESOPHAGEAL ECHOCARDIOGRAM;  Surgeon: Rexene Alberts, MD;  Location: [place];  Service: Open Heart Surgery;  Laterality: N/A;   LEFT AND RIGHT HEART CATHETERIZATION WITH CORONARY ANGIOGRAM N/A 06/27/2013   Procedure: LEFT AND RIGHT HEART CATHETERIZATION WITH CORONARY ANGIOGRAM;  Surgeon: Jettie Booze, MD;  Location: [facility] CATH LAB;  Service: Cardiovascular;  Laterality: N/A;   MEDIAL PARTIAL KNEE REPLACEMENT Left 1990's?   MINIMALLY INVASIVE MAZE PROCEDURE N/A 10/29/2013   Procedure: MINIMALLY INVASIVE MAZE PROCEDURE;  Surgeon: Rexene Alberts, MD;  Location: [place];  Service: Open Heart Surgery;  Laterality: N/A;   TEE WITHOUT CARDIOVERSION N/A 06/30/2013   Procedure:  TRANSESOPHAGEAL ECHOCARDIOGRAM (TEE) ;  Surgeon: [name], MD;  Location: [facility] ENDOSCOPY;  Service: Cardiovascular;  Laterality: N/A;   TRANSURETHRAL RESECTION OF PROSTATE  2010     PHYSICAL EXAM:  VS: BP (!) 110/58 (BP Location: Left Arm, Patient Position: Sitting)   Ht '6\' 2"'$  (1.88 m)   Wt 276 lb (125.2 kg)   BMI 35.44 kg/m  Physical Exam Gen: NAD, alert, cooperative with exam, well-appearing MSK:  Neurovascularly intact      Aspiration/Injection Procedure Note Jose Snyder Feb 26, 1941  Procedure: Injection Indications: right knee pain  Procedure Details Consent: Risks of procedure as well as the alternatives and risks of each were explained to the (patient/caregiver).  Consent for procedure obtained. Time Out: Verified patient identification, verified procedure, site/side was marked, verified correct patient position, special equipment/implants available, medications/allergies/relevent history reviewed, required imaging and test results available.  Performed.  The area was cleaned with iodine and alcohol swabs.    The right knee superior lateral suprapatellar pouch was injected using 3 cc of 1% lidocaine on a 22-gauge 1-1/2 inch needle.  The syringe was switched and a mixture containing 5 cc's of 32 mg Zilretta and 4 cc's of 0.25% bupivacaine was injected.  Ultrasound was used. Images were obtained in long views showing the injection.    A sterile dressing was applied.  Patient did tolerate procedure well.    ASSESSMENT & PLAN:   OA (osteoarthritis) of knee Acute on chronic in nature.  The pain feels similar to his previous pain from his degenerative changes.  Has done well with previous Zilretta injection. -Counseled on home exercise therapy and supportive care. -Zilretta injection today.

## 2021-09-06 LAB — PULMONARY FUNCTION TEST

## 2021-09-09 ENCOUNTER — Ambulatory Visit (INDEPENDENT_AMBULATORY_CARE_PROVIDER_SITE_OTHER): Payer: Medicare Other | Admitting: Podiatry

## 2021-09-09 ENCOUNTER — Encounter: Payer: Self-pay | Admitting: Podiatry

## 2021-09-09 DIAGNOSIS — N183 Chronic kidney disease, stage 3 unspecified: Secondary | ICD-10-CM

## 2021-09-09 DIAGNOSIS — D689 Coagulation defect, unspecified: Secondary | ICD-10-CM

## 2021-09-09 DIAGNOSIS — I4891 Unspecified atrial fibrillation: Secondary | ICD-10-CM

## 2021-09-09 DIAGNOSIS — M79676 Pain in unspecified toe(s): Secondary | ICD-10-CM

## 2021-09-09 DIAGNOSIS — B351 Tinea unguium: Secondary | ICD-10-CM | POA: Diagnosis not present

## 2021-09-09 NOTE — Progress Notes (Signed)
This patient returns to my office for at risk foot care.  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  This patient is unable to cut nails himself since the patient cannot reach his nails.These nails are painful walking and wearing shoes.  This patient presents for at risk foot care today.  General Appearance  Alert, conversant and in no acute stress.  Vascular  Dorsalis pedis and posterior tibial  pulses are palpable  bilaterally.  Capillary return is within normal limits  bilaterally. Temperature is within normal limits  bilaterally.  Neurologic  Senn-Weinstein monofilament wire test within normal limits  bilaterally. Muscle power within normal limits bilaterally.  Nails Thick disfigured discolored nails with subungual debris  from hallux to fifth toes bilaterally. No evidence of bacterial infection or drainage bilaterally.   Orthopedic  No limitations of motion  feet .  No crepitus or effusions noted.  No bony pathology or digital deformities noted.  Skin  normotropic skin with no porokeratosis noted bilaterally.  No signs of infections or ulcers noted.     Onychomycosis  Pain in right toes  Pain in left toes  Consent was obtained for treatment procedures.   Mechanical debridement of nails 1-5  bilaterally performed with a nail nipper.  Filed with dremel without incident.    Return office visit   3 months                   Told patient to return for periodic foot care and evaluation due to potential at risk complications.   Johnetta Sloniker DPM  

## 2021-09-20 ENCOUNTER — Other Ambulatory Visit: Payer: Self-pay | Admitting: Cardiology

## 2021-09-20 DIAGNOSIS — I480 Paroxysmal atrial fibrillation: Secondary | ICD-10-CM

## 2021-10-06 NOTE — Progress Notes (Signed)
HPI: FU atrial fibrillation. Echo 4/15 EF 20-25%. LHC demonstrated mod non-obs CAD. Most significant lesion was a mid D1 with 75% stenosis. DCM was felt to be out of proportion to CAD (NICM). Had DCCV on amiodarone but did not hold sinus. He was seen by Dr. Rayann Heman and referred to Dr. Roxy Manns for surgical maze. Carotid Dopplers August 2015 showed 1-39% bilateral stenosis. Patient underwent maze procedure with clipping of left atrial appendage on October 29, 2013. CTA April 2018 showed focal saccular outpouching of the distal descending thoracic aorta with no intramural hematoma or inflammatory changes.  Findings felt possibly early ulcer formation and not a penetrating ulcer per se.  Images were reviewed by Dr. Roxy Manns and no further follow-up recommended. Echocardiogram June 2023 showed normal LV function, moderate left atrial enlargement, mildly dilated ascending aorta at 39 mm.   Since last seen, he has some dyspnea on exertion which is mildly improved compared to previous.  No orthopnea, PND, pedal edema, chest pain or syncope.  Current Outpatient Medications  Medication Sig Dispense Refill   amiodarone (PACERONE) 200 MG tablet TAKE ONE (1) TABLET BY MOUTH EVERY DAY 90 tablet 1   apixaban (ELIQUIS) 2.5 MG TABS tablet Take 1 tablet (2.5 mg total) by mouth 2 (two) times daily. 180 tablet 3   atorvastatin (LIPITOR) 80 MG tablet Take 1 tablet (80 mg total) by mouth daily with breakfast. 30 tablet 12   chlorhexidine (PERIDEX) 0.12 % solution RINSE 1/2 OUNCE (15ML) BY MOUTH TWICE A DAY AS DIRECTED BY YOUR MEDICAL PROVIDER     Cholecalciferol 50 MCG (2000 UT) TABS Take 1 tablet by mouth daily.     dapagliflozin propanediol (FARXIGA) 10 MG TABS tablet Take 1 tablet (10 mg total) by mouth daily before breakfast. 90 tablet 3   docusate sodium (COLACE) 100 MG capsule Take 400 mg by mouth at bedtime as needed for mild constipation.      ferrous sulfate 325 (65 FE) MG tablet Take 325 mg by mouth as directed.  TAKE 1 TAB M/W/F     ibuprofen (ADVIL,MOTRIN) 200 MG tablet Take 400 mg by mouth every 8 (eight) hours as needed for headache or moderate pain.     lisinopril (PRINIVIL,ZESTRIL) 40 MG tablet Take 40 mg by mouth daily.     Melatonin 3 MG CAPS Take 3 mg by mouth at bedtime.     metoprolol succinate (TOPROL-XL) 200 MG 24 hr tablet Take 1 tablet (200 mg total) by mouth daily. Take with or immediately following a meal. 90 tablet 3   omeprazole (PRILOSEC) 20 MG capsule Take 20 mg by mouth daily.     temazepam (RESTORIL) 15 MG capsule Take 15 mg by mouth at bedtime as needed for sleep.     torsemide (DEMADEX) 20 MG tablet Take 2 tablets (40 mg total) by mouth daily. 180 tablet 3   amLODipine (NORVASC) 10 MG tablet Take 1 tablet (10 mg total) by mouth daily at 6 PM. 90 tablet 3   Current Facility-Administered Medications  Medication Dose Route Frequency Provider Last Rate Last Admin   sodium chloride flush (NS) 0.9 % injection 3 mL  3 mL Intravenous Q12H Lelon Perla, MD         Past Medical History:  Diagnosis Date   Anxiety    Arthritis    R hand- OA   Atrial Fibrillation  06/25/2013   CAD (coronary artery disease), native coronary artery 06/28/2013   a. LHC (06/27/13):  Mid  LAD 40%, mid D1 75%, mid CFX 50-60%, mid RCA 50%, EF 20%, LVEDP 24 mmHg.   CHF (congestive heart failure) (HCC)    Chronic combined systolic and diastolic heart failure ([place]) 06/26/2013   a.  Echo (06/25/13):  EF 20-25%, diff HK, restrictive physio, mild MR, mod to severe LAE, mild RVE, mildly reduced RVSF, mod to severe RAE   Chronic kidney disease    Dyslipidemia 07/15/2013   Dysrhythmia    a-fib   GERD (gastroesophageal reflux disease)    Hypertension    Incidental pulmonary nodule 06/22/2014   Seen on CT angiogram of chest   NICM (nonischemic cardiomyopathy) with EF 20-25% 06/26/2013   Obesity    Penetrating atherosclerotic ulcer of aorta ([place]) 10/22/2013   Small penetrating ulcer of descending thoracic aorta  discovered on routine CTA   Prostate cancer ([place])    watchful waiting, no treatment so far   S/P Maze operation for atrial fibrillation 10/29/2013   Complete bilateral atrial lesion set using cryothermy and bipolar radiofrequency ablation with clipping of LA appendage via right mini thoracotomy   Shortness of breath    seen by Stanford Breed, & low energy     Past Surgical History:  Procedure Laterality Date   CARDIAC CATHETERIZATION  06/2013   CARDIOVERSION N/A 06/30/2013   Procedure: CARDIOVERSION;  Surgeon: [name], MD;  Location: [place];  Service: Cardiovascular;  Laterality: N/A;   CARDIOVERSION N/A 07/31/2013   Procedure: CARDIOVERSION;  Surgeon: Thayer Headings, MD;  Location: [place];  Service: Cardiovascular;  Laterality: N/A;   CARDIOVERSION N/A 03/19/2018   Procedure: CARDIOVERSION;  Surgeon: [name], MD;  Location: [place];  Service: Cardiovascular;  Laterality: N/A;   CARDIOVERSION N/A 07/24/2018   Procedure: CARDIOVERSION;  Surgeon: [name], MD;  Location: [facility] ENDOSCOPY;  Service: Cardiovascular;  Laterality: N/A;   CLIPPING OF ATRIAL APPENDAGE  10/29/2013   Procedure: CLIPPING OF ATRIAL APPENDAGE;  Surgeon: Rexene Alberts, MD;  Location: [place];  Service: Open Heart Surgery;;   INTRAOPERATIVE TRANSESOPHAGEAL ECHOCARDIOGRAM N/A 10/29/2013   Procedure: INTRAOPERATIVE TRANSESOPHAGEAL ECHOCARDIOGRAM;  Surgeon: Rexene Alberts, MD;  Location: [place];  Service: Open Heart Surgery;  Laterality: N/A;   LEFT AND RIGHT HEART CATHETERIZATION WITH CORONARY ANGIOGRAM N/A 06/27/2013   Procedure: LEFT AND RIGHT HEART CATHETERIZATION WITH CORONARY ANGIOGRAM;  Surgeon: Jettie Booze, MD;  Location: [facility] CATH LAB;  Service: Cardiovascular;  Laterality: N/A;   MEDIAL PARTIAL KNEE REPLACEMENT Left 1990's?   MINIMALLY INVASIVE MAZE PROCEDURE N/A 10/29/2013   Procedure: MINIMALLY INVASIVE MAZE PROCEDURE;  Surgeon: Rexene Alberts, MD;  Location: [place];  Service: Open Heart  Surgery;  Laterality: N/A;   TEE WITHOUT CARDIOVERSION N/A 06/30/2013   Procedure: TRANSESOPHAGEAL ECHOCARDIOGRAM (TEE) ;  Surgeon: [name], MD;  Location: [place];  Service: Cardiovascular;  Laterality: N/A;   TRANSURETHRAL RESECTION OF PROSTATE  2010    Social History   Socioeconomic History   Marital status: Widowed    Spouse name: Not on file   Number of children: Not on file   Years of education: Not on file   Highest education level: Not on file  Occupational History   Not on file  Tobacco Use   Smoking status: Never   Smokeless tobacco: Never   Tobacco comments:    06/25/2013 "smoked very light; years and years ago"  Vaping Use   Vaping Use: Never used  Substance and Sexual Activity   Alcohol use: Yes  Alcohol/week: 14.0 standard drinks of alcohol    Types: 7 Glasses of wine, 7 Shots of liquor per week   Drug use: No   Sexual activity: Not Currently  Other Topics Concern   Not on file  Social History Narrative   He lives in [place]. He is widowed. Wife passed of breast cancer last year. No children.    Social Determinants of Health   Financial Resource Strain: Not on file  Food Insecurity: Not on file  Transportation Needs: Not on file  Physical Activity: Not on file  Stress: Not on file  Social Connections: Not on file  Intimate Partner Violence: Not on file    Family History  Problem Relation Age of Onset   Healthy Father    Healthy Mother    Healthy Sister     ROS: no fevers or chills, productive cough, hemoptysis, dysphasia, odynophagia, melena, hematochezia, dysuria, hematuria, rash, seizure activity, orthopnea, PND, pedal edema, claudication. Remaining systems are negative.  Physical Exam: Well-developed well-nourished in no acute distress.  Skin is warm and dry.  HEENT is normal.  Neck is supple.  Chest is clear to auscultation with normal expansion.  Cardiovascular exam is regular rate and rhythm.  Abdominal exam nontender or  distended. No masses palpated. Extremities show no edema. neuro grossly intact  A/P  1 paroxysmal atrial fibrillation-patient is in sinus rhythm on examination.  Continue amiodarone and apixaban.  2 chronic diastolic congestive heart failure-symptoms have improved compared to last office visit.  Continue Lasix and Farxiga.  Potassium and renal function monitored at the [place].  3 history of nonischemic cardiomyopathy-LV function has normalized on most recent echocardiogram.  Continue ACE inhibitor and beta-blocker.  4 hypertension-patient's blood pressure is controlled.  Continue present medical regimen.  5 hyperlipidemia-continue statin.  6 coronary artery disease-patient denies chest pain.  Continue medical therapy.  Continue statin.  He is not on aspirin given need for apixaban.  7 penetrating atherosclerotic aortic ulcer-continue statin.  Previously seen by cardiothoracic surgery and medical therapy recommended.  8 chronic stage III kidney disease-check potassium and renal function.  9 snoring-patient feels as though he snores and is certainly at risk for sleep apnea.  We will arrange a sleep study to further assess.  Kirk Ruths, MD

## 2021-10-19 ENCOUNTER — Encounter: Payer: Self-pay | Admitting: Cardiology

## 2021-10-19 ENCOUNTER — Telehealth: Payer: Self-pay

## 2021-10-19 ENCOUNTER — Ambulatory Visit (INDEPENDENT_AMBULATORY_CARE_PROVIDER_SITE_OTHER): Payer: Medicare Other | Admitting: Cardiology

## 2021-10-19 ENCOUNTER — Telehealth: Payer: Self-pay | Admitting: *Deleted

## 2021-10-19 VITALS — BP 138/62 | HR 76 | Ht 74.0 in | Wt 273.0 lb

## 2021-10-19 DIAGNOSIS — I10 Essential (primary) hypertension: Secondary | ICD-10-CM | POA: Diagnosis not present

## 2021-10-19 DIAGNOSIS — R0683 Snoring: Secondary | ICD-10-CM | POA: Diagnosis not present

## 2021-10-19 DIAGNOSIS — I5032 Chronic diastolic (congestive) heart failure: Secondary | ICD-10-CM

## 2021-10-19 DIAGNOSIS — I2510 Atherosclerotic heart disease of native coronary artery without angina pectoris: Secondary | ICD-10-CM | POA: Diagnosis not present

## 2021-10-19 DIAGNOSIS — I480 Paroxysmal atrial fibrillation: Secondary | ICD-10-CM

## 2021-10-19 DIAGNOSIS — I719 Aortic aneurysm of unspecified site, without rupture: Secondary | ICD-10-CM

## 2021-10-19 DIAGNOSIS — E7800 Pure hypercholesterolemia, unspecified: Secondary | ICD-10-CM

## 2021-10-19 NOTE — Telephone Encounter (Signed)
Called and made the patient aware that He may proceed with the [facility] Sleep Study. PIN # provided to the patient. Patient made aware that He will be contacted after the test has been read with the results and any recommendations. Patient verbalized understanding and thanked me for the call.

## 2021-10-19 NOTE — Telephone Encounter (Signed)
Staff message sent to Debria Garret ok to activate itamar.

## 2021-10-19 NOTE — Patient Instructions (Signed)

## 2021-10-20 ENCOUNTER — Telehealth: Payer: Self-pay

## 2021-10-20 NOTE — Telephone Encounter (Signed)
Left a voice message for the patient to give me a call back at the office. STOP BANG questions need to be asked for the [facility] sleep study

## 2021-10-26 ENCOUNTER — Encounter: Payer: Self-pay | Admitting: Internal Medicine

## 2021-10-26 ENCOUNTER — Ambulatory Visit (INDEPENDENT_AMBULATORY_CARE_PROVIDER_SITE_OTHER): Payer: No Typology Code available for payment source | Admitting: Internal Medicine

## 2021-10-26 VITALS — BP 110/72 | HR 71 | Temp 97.8°F | Ht 74.0 in | Wt 273.0 lb

## 2021-10-26 DIAGNOSIS — I480 Paroxysmal atrial fibrillation: Secondary | ICD-10-CM | POA: Diagnosis not present

## 2021-10-26 DIAGNOSIS — R0602 Shortness of breath: Secondary | ICD-10-CM

## 2021-10-26 MED ORDER — LEVALBUTEROL TARTRATE 45 MCG/ACT IN AERO: 2.0000 | INHALATION_SPRAY | Freq: Four times a day (QID) | RESPIRATORY_TRACT | 12 refills | Status: AC | PRN

## 2021-10-26 NOTE — Patient Instructions (Addendum)
Please schedule follow up scheduled with myself in 6 weeks.  If my schedule is not open yet, we will contact you with a reminder closer to that time. Please call [phone] if you haven't heard from [place] a month before.   Try taking the xopenex inhaler 2 puffs up to 4 times a day as needed for symptoms or shortness of breath. I agree with the sleep study already ordered.  I want you to try to walk and stay as active as you can. You can try taking the inhaler before activity to see if that helps you do more or go further.

## 2021-10-26 NOTE — Progress Notes (Signed)
Jose Snyder    [id number]    26-May-1940  Primary Care Physician:Tapp, Dannielle Burn, MD  Referring Physician: Zachery Dauer, MD [address] [place],  [place] [address] Reason for Consultation: shortness of breath Date of Consultation: 10/26/2021  Chief complaint:   Chief Complaint  Patient presents with   Consult    He is having increased shortness of breath with exertion since last couple months.      HPI: Jose Snyder is a 81 y.o. man who presents for new patient evaluation of shortness of breath from the [place].  Has had dyspnea for many years which has worsened over the last 18 months.  His symptoms of dyspnea are with exertion usually, such as walking to the mailbox.  He has started having the habit of holding his breath while he does his ADLs around the house.  He denies coughing or wheezing.  Denies recurrent pneumonia or bronchitis.   He sees Dr. Stanford Breed in cardiology for paroxysmal a fibrillation and is in NSR on amiodarone, and is on apixaban. Also has HFpEF and NICM with normalized LV function on GDMT. He is has ordered a sleep study. Takes torsemide and feels the fluid is kept off.   He has done cardiac rehab twice and that helped in the past.   Social history:  Occupation: was in [employer]. 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  Smoking history: quit 1960 1/2 ppd x 3 years.   Social History   Occupational History   Not on file  Tobacco Use   Smoking status: Never   Smokeless tobacco: Never   Tobacco comments:    06/25/2013 "smoked very light; years and years ago"  Vaping Use   Vaping Use: Never used  Substance and Sexual Activity   Alcohol use: Yes    Alcohol/week: 14.0 standard drinks of alcohol    Types: 7 Glasses of wine, 7 Shots of liquor per week   Drug use: No   Sexual activity: Not Currently    Relevant  family history:  Family History  Problem Relation Age of Onset   Healthy Mother    Healthy Father    Healthy Sister    Lung cancer Neg Hx    Lung disease Neg Hx     Past Medical History:  Diagnosis Date   Anxiety    Arthritis    R hand- OA   Atrial Fibrillation  06/25/2013   CAD (coronary artery disease), native coronary artery 06/28/2013   a. LHC (06/27/13):  Mid LAD 40%, mid D1 75%, mid CFX 50-60%, mid RCA 50%, EF 20%, LVEDP 24 mmHg.   CHF (congestive heart failure) (HCC)    Chronic combined systolic and diastolic heart failure ([place]) 06/26/2013   a.  Echo (06/25/13):  EF 20-25%, diff HK, restrictive physio, mild MR, mod to severe LAE, mild RVE, mildly reduced RVSF, mod to severe RAE   Chronic kidney disease    Dyslipidemia 07/15/2013   Dysrhythmia    a-fib   GERD (gastroesophageal reflux disease)    Hypertension    Incidental pulmonary nodule 06/22/2014   Seen on CT angiogram of chest   NICM (nonischemic cardiomyopathy) with EF 20-25% 06/26/2013   Obesity    Penetrating atherosclerotic ulcer of aorta ([place]) 10/22/2013   Small penetrating ulcer of descending thoracic aorta discovered on routine CTA   Prostate cancer (  [place])    watchful waiting, no treatment so far   S/P Maze operation for atrial fibrillation 10/29/2013   Complete bilateral atrial lesion set using cryothermy and bipolar radiofrequency ablation with clipping of LA appendage via right mini thoracotomy   Shortness of breath    seen by Stanford Breed, & low energy     Past Surgical History:  Procedure Laterality Date   CARDIAC CATHETERIZATION  06/2013   CARDIOVERSION N/A 06/30/2013   Procedure: CARDIOVERSION;  Surgeon: [name], MD;  Location: [place];  Service: Cardiovascular;  Laterality: N/A;   CARDIOVERSION N/A 07/31/2013   Procedure: CARDIOVERSION;  Surgeon: Thayer Headings, MD;  Location: [place];  Service: Cardiovascular;  Laterality: N/A;   CARDIOVERSION N/A 03/19/2018   Procedure: CARDIOVERSION;  Surgeon:  [name], MD;  Location: [facility] ENDOSCOPY;  Service: Cardiovascular;  Laterality: N/A;   CARDIOVERSION N/A 07/24/2018   Procedure: CARDIOVERSION;  Surgeon: [name], MD;  Location[facility] ENDOSCOPY;  Service: Cardiovascular;  Laterality: N/A;   CLIPPING OF ATRIAL APPENDAGE  10/29/2013   Procedure: CLIPPING OF ATRIAL APPENDAGE;  Surgeon: Rexene Alberts, MD;  Location: [place];  Service: Open Heart Surgery;;   INTRAOPERATIVE TRANSESOPHAGEAL ECHOCARDIOGRAM N/A 10/29/2013   Procedure: INTRAOPERATIVE TRANSESOPHAGEAL ECHOCARDIOGRAM;  Surgeon: Rexene Alberts, MD;  Location: [place];  Service: Open Heart Surgery;  Laterality: N/A;   LEFT AND RIGHT HEART CATHETERIZATION WITH CORONARY ANGIOGRAM N/A 06/27/2013   Procedure: LEFT AND RIGHT HEART CATHETERIZATION WITH CORONARY ANGIOGRAM;  Surgeon: Jettie Booze, MD;  Location: [facility] CATH LAB;  Service: Cardiovascular;  Laterality: N/A;   MEDIAL PARTIAL KNEE REPLACEMENT Left 1990's?   MINIMALLY INVASIVE MAZE PROCEDURE N/A 10/29/2013   Procedure: MINIMALLY INVASIVE MAZE PROCEDURE;  Surgeon: Rexene Alberts, MD;  Location: [place];  Service: Open Heart Surgery;  Laterality: N/A;   TEE WITHOUT CARDIOVERSION N/A 06/30/2013   Procedure: TRANSESOPHAGEAL ECHOCARDIOGRAM (TEE) ;  Surgeon: [name], MD;  Location: [place];  Service: Cardiovascular;  Laterality: N/A;   TRANSURETHRAL RESECTION OF PROSTATE  2010     Physical Exam: Blood pressure 110/72, pulse 71, temperature 97.8 F (36.6 C), height '6\' 2"'$  (1.88 m), weight 273 lb (123.8 kg), SpO2 97 %. Gen:      No acute distress ENT:  no nasal polyps, mucus membranes moist, bearded Lungs:    No increased respiratory effort, symmetric chest wall excursion, clear to auscultation bilaterally, no wheezes or crackles CV:         Regular rate and rhythm; no murmurs, rubs, or gallops.  No pedal edema Abd:      + bowel sounds; soft, non-tender; no distension MSK: no acute synovitis of DIP or PIP joints, no mechanics  hands.  Skin:      Warm and dry; no rashes Neuro: normal speech, no focal facial asymmetry Psych: alert and oriented x3, normal mood and affect   Data Reviewed/Medical Decision Making:  Independent interpretation of tests: Imaging:  Review of patient's CT Chest July 2023 at the [facility] images revealed mild centrilobular emphysema R>L, mild bibasilar atelectasis, trace right basilar pleural scarring without calcifications, possibly post-infectious. The patient's images have been independently reviewed by me.    PFTs: I have personally reviewed the patient's PFTs and PFTs done at the [place] in July 2023 show normal spirometry without restriction to ventilation. Dlco was reduced but does not appear corrected for hgb.  Normal flow volume loop. No significant response to bronchodilator.      Latest Ref  Rng & Units 10/08/2013   11:01 AM  PFT Results  FVC-Pre L 3.30   FVC-Predicted Pre % 65   FVC-Post L 3.39   FVC-Predicted Post % 67   Pre FEV1/FVC % % 83   Post FEV1/FCV % % 80   FEV1-Pre L 2.74   FEV1-Predicted Pre % 74   FEV1-Post L 2.72   DLCO uncorrected ml/min/mmHg 17.59   DLCO UNC% % 46   DLCO corrected ml/min/mmHg 17.59   DLCO COR %Predicted % 46   DLVA Predicted % 68   TLC L 5.56   TLC % Predicted % 70   RV % Predicted % 72     Labs:  Lab Results  Component Value Date   WBC 6.2 07/19/2021   HGB 10.2 (L) 07/19/2021   HCT 32.9 (L) 07/19/2021   MCV 83 07/19/2021   PLT 270 07/19/2021   Lab Results  Component Value Date   NA 142 08/04/2021   K 4.5 08/04/2021   CL 102 08/04/2021   CO2 17 (L) 08/04/2021     Immunization status:  Immunization History  Administered Date(s) Administered   Fluad Quad(high Dose 65+) 12/16/2018, 12/04/2019, 01/25/2021, 10/25/2021   Influenza Split 12/11/2012   Influenza, High Dose Seasonal PF 11/30/2016   Influenza, Seasonal, Injecte, Preservative Fre 12/07/2015   Influenza,trivalent, recombinat, inj, PF 12/21/2017   Influenza-Unspecified  11/28/2007, 11/27/2008, 11/27/2009, 10/29/2010, 10/29/2011, 12/02/2013, 02/10/2015, 12/06/2016   Janssen (J&J) SARS-COV-2 Vaccination 05/12/2019, 03/05/2020   Pneumococcal Conjugate-13 02/12/2014   Pneumococcal Polysaccharide-23 08/25/2021   Pneumococcal-Unspecified 04/15/2009   Td 08/25/2021   Tdap 02/07/2011   Zoster Recombinat (Shingrix) 07/01/2020, 09/01/2020     I reviewed prior external note(s) from cardiology, [place]  I reviewed the result(s) of the labs and imaging as noted above.   I have ordered medication  Assessment:  Shortness of breath Paroxysmal A. Fib, rate controlled Chronic HFpEF, compensated Possible OSA   Plan/Recommendations:  He has very remote mild smoking history with no evidence of obstruction on pfts. I doubt this is related to asthma or COPD. I suspect his symptoms are most likely related to obesity, hfpef, deconditioning.   No crackles on exam. I think his ct chest shows mild bibasilar atelectasis. I do not think he has ILD or pulmonary fibrosis.   We can do a Trial of SABA - xopanex due to history of a. Fib to see if this helps.   Agree with sleep study as ordered by cardiology.   We discussed disease management and progression at length today.   I spent 45 minutes in the care of this patient today including pre-charting, chart review, review of results, face-to-face care, coordination of care and communication with consultants etc.).   Return to Care: Return in about 6 weeks (around 12/07/2021).  Lenice Llamas, MD Pulmonary and [place]  CC: Tapp, Dannielle Burn, MD

## 2021-10-26 NOTE — Progress Notes (Signed)
The patient has been prescribed the inhaler xopenex. Inhaler technique was demonstrated to patient. The patient subsequently demonstrated correct technique.

## 2021-11-02 ENCOUNTER — Other Ambulatory Visit (HOSPITAL_COMMUNITY): Payer: Self-pay

## 2021-11-03 ENCOUNTER — Telehealth: Payer: Self-pay

## 2021-11-03 ENCOUNTER — Other Ambulatory Visit (HOSPITAL_COMMUNITY): Payer: Self-pay

## 2021-11-03 NOTE — Telephone Encounter (Signed)
Received fax from [place] that pt Xopenex is not on formulary. Per Dr. Shearon Stalls pt needs PA r/t having A-fid. Pharmacy could you please assist with this?   Routing to Dr. Shearon Stalls as well as Juluis Rainier

## 2021-11-04 NOTE — Telephone Encounter (Signed)
Pt is using [facility] to fill medications

## 2021-11-07 NOTE — Telephone Encounter (Signed)
Adrienne - we have a lot o patients at the [place] who need medications that are off formulary. Can you please call them and ask what they want [place] to do when they need a non=formulary medication? This is a problem that needs a solution.

## 2021-11-07 NOTE — Telephone Encounter (Signed)
Per [employer] website: All providers must follow [employer] ([facility]) Directive [id number], [facility] Formulary Management Process, which includes provisions for requesting non-formulary drugs. If you need to submit a non-formulary request for a medication, please contact the [facility] representative at the referring [facility] medical facility to obtain their non-formulary request form.  Patient was referred for [facility].   Please call Community Care at [phone], ext [phone] for next steps.  For our specialty medications (Ofev/Esbriet) we send the rx to [facility] and fax clinicals to them for review, but I am not confident about the process for non-specialty medications.  Routing to PA team for f/u  Knox Saliva, PharmD, MPH, BCPS, CPP Clinical Pharmacist (Rheumatology and Pulmonology)

## 2021-11-07 NOTE — Telephone Encounter (Signed)
There must be a way to get them a medically necessary medication even if it is not on formulary at the [place]. The [facility] does prior authorizations if necessary. How do we initiate one if needed?

## 2021-11-07 NOTE — Telephone Encounter (Signed)
We got a form from the [place]. They list ProAir and Striverdi are listed as alternative inhalers.

## 2021-11-08 ENCOUNTER — Telehealth: Payer: Self-pay | Admitting: Pharmacy Technician

## 2021-11-08 NOTE — Telephone Encounter (Signed)
Thanks. Yes albuterol not appropriate due to atrial fibrillation risk. Striverdi not appropriate because this is not the correct category of medication that can be used as rescue therapy which is what the patient requires.

## 2021-11-10 NOTE — Telephone Encounter (Signed)
Received Prior Auth request for Generic Xopenex HFA from [place]. They are requesting patient to try their preferred alternative(s)- Proair inhaler and Striverdi. Justification for use of non-formulary medication is required.  Faxed form back to the [facility]) [phone] with the information sent by the physician, that albuterol isn't appropriate due to the pt's AFIB and that the Striverdi isn't the appropriate class for the pt's need for a rescue inhaler.

## 2021-11-29 ENCOUNTER — Telehealth: Payer: Self-pay

## 2021-11-29 NOTE — Telephone Encounter (Signed)
I called the patient to speak with him about the [employer]. I LVM for him to return my call.

## 2021-12-07 ENCOUNTER — Encounter: Payer: Self-pay | Admitting: Internal Medicine

## 2021-12-07 ENCOUNTER — Ambulatory Visit (INDEPENDENT_AMBULATORY_CARE_PROVIDER_SITE_OTHER): Payer: No Typology Code available for payment source | Admitting: Internal Medicine

## 2021-12-07 VITALS — BP 130/60 | HR 61 | Ht 74.0 in | Wt 275.6 lb

## 2021-12-07 DIAGNOSIS — R0602 Shortness of breath: Secondary | ICD-10-CM

## 2021-12-07 NOTE — Progress Notes (Signed)
Jose Snyder    [id number]    December 26, 1940  Primary Care Physician:Tapp, Dannielle Burn, MD Date of Appointment: 12/07/2021 Established Patient Visit  Chief complaint:   Chief Complaint  Patient presents with   Shortness of Breath          HPI: Jose Snyder is a 81 y.o. man with history of shortness of breath with exertion who follows at the [place].   Interval Updates: Here after trial of SABA with xopanex. He does feel like this is helping a little bit.    He is undergoing sleep study per cardiology.   PFTs have show no obstructive lung disease.   Mobility also limited by knee pain on the right side. I have reviewed the patient's family social and past medical history and updated as appropriate.   Past Medical History:  Diagnosis Date   Anxiety    Arthritis    R hand- OA   Atrial Fibrillation  06/25/2013   CAD (coronary artery disease), native coronary artery 06/28/2013   a. LHC (06/27/13):  Mid LAD 40%, mid D1 75%, mid CFX 50-60%, mid RCA 50%, EF 20%, LVEDP 24 mmHg.   CHF (congestive heart failure) (HCC)    Chronic combined systolic and diastolic heart failure ([place]) 06/26/2013   a.  Echo (06/25/13):  EF 20-25%, diff HK, restrictive physio, mild MR, mod to severe LAE, mild RVE, mildly reduced RVSF, mod to severe RAE   Chronic kidney disease    Dyslipidemia 07/15/2013   Dysrhythmia    a-fib   GERD (gastroesophageal reflux disease)    Hypertension    Incidental pulmonary nodule 06/22/2014   Seen on CT angiogram of chest   NICM (nonischemic cardiomyopathy) with EF 20-25% 06/26/2013   Obesity    Penetrating atherosclerotic ulcer of aorta ([place]) 10/22/2013   Small penetrating ulcer of descending thoracic aorta discovered on routine CTA   Prostate cancer ([place])    watchful waiting, no treatment so far   S/P Maze operation for atrial fibrillation 10/29/2013   Complete bilateral atrial lesion set using cryothermy and bipolar radiofrequency ablation with clipping of LA appendage via  right mini thoracotomy   Shortness of breath    seen by Stanford Breed, & low energy     Past Surgical History:  Procedure Laterality Date   CARDIAC CATHETERIZATION  06/2013   CARDIOVERSION N/A 06/30/2013   Procedure: CARDIOVERSION;  Surgeon: [name], MD;  Location: [place];  Service: Cardiovascular;  Laterality: N/A;   CARDIOVERSION N/A 07/31/2013   Procedure: CARDIOVERSION;  Surgeon: Thayer Headings, MD;  Location: [place];  Service: Cardiovascular;  Laterality: N/A;   CARDIOVERSION N/A 03/19/2018   Procedure: CARDIOVERSION;  Surgeon: [name], MD;  Location: [place];  Service: Cardiovascular;  Laterality: N/A;   CARDIOVERSION N/A 07/24/2018   Procedure: CARDIOVERSION;  Surgeon: [name], MD;  Location: [facility] ENDOSCOPY;  Service: Cardiovascular;  Laterality: N/A;   CLIPPING OF ATRIAL APPENDAGE  10/29/2013   Procedure: CLIPPING OF ATRIAL APPENDAGE;  Surgeon: Rexene Alberts, MD;  Location: [place];  Service: Open Heart Surgery;;   INTRAOPERATIVE TRANSESOPHAGEAL ECHOCARDIOGRAM N/A 10/29/2013   Procedure: INTRAOPERATIVE TRANSESOPHAGEAL ECHOCARDIOGRAM;  Surgeon: Rexene Alberts, MD;  Location: [place];  Service: Open Heart Surgery;  Laterality: N/A;   LEFT AND RIGHT HEART CATHETERIZATION WITH CORONARY ANGIOGRAM N/A 06/27/2013   Procedure: LEFT AND RIGHT HEART CATHETERIZATION WITH CORONARY ANGIOGRAM;  Surgeon: Jettie Booze, MD;  Location: [facility] CATH LAB;  Service: Cardiovascular;  Laterality: N/A;   MEDIAL PARTIAL KNEE REPLACEMENT Left 1990's?   MINIMALLY INVASIVE MAZE PROCEDURE N/A 10/29/2013   Procedure: MINIMALLY INVASIVE MAZE PROCEDURE;  Surgeon: Rexene Alberts, MD;  Location: [place];  Service: Open Heart Surgery;  Laterality: N/A;   TEE WITHOUT CARDIOVERSION N/A 06/30/2013   Procedure: TRANSESOPHAGEAL ECHOCARDIOGRAM (TEE) ;  Surgeon: [name], MD;  Location: [facility] ENDOSCOPY;  Service: Cardiovascular;  Laterality: N/A;   TRANSURETHRAL RESECTION OF PROSTATE  2010    Family  History  Problem Relation Age of Onset   Healthy Mother    Healthy Father    Healthy Sister    Lung cancer Neg Hx    Lung disease Neg Hx     Social History   Occupational History   Not on file  Tobacco Use   Smoking status: Never   Smokeless tobacco: Never   Tobacco comments:    06/25/2013 "smoked very light; years and years ago"  Vaping Use   Vaping Use: Never used  Substance and Sexual Activity   Alcohol use: Yes    Alcohol/week: 14.0 standard drinks of alcohol    Types: 7 Glasses of wine, 7 Shots of liquor per week   Drug use: No   Sexual activity: Not Currently     Physical Exam: Blood pressure 130/60, pulse 61, height '6\' 2"'$  (1.88 m), weight 275 lb 9.6 oz (125 kg), SpO2 97 %.  Gen:      No acute distress, obese, ambulates with walker ENT:  no nasal polyps, mucus membranes moist Lungs:    No increased respiratory effort, symmetric chest wall excursion, clear to auscultation bilaterally, no wheezes or crackles CV:         Regular rate and rhythm; no murmurs, rubs, or gallops.  No pedal edema   Data Reviewed: Imaging: I have personally reviewed the chest xray June 2023 - no acute cardiopulmonary process.   PFTs:     Latest Ref Rng & Units 10/08/2013   11:01 AM  PFT Results  FVC-Pre L 3.30   FVC-Predicted Pre % 65   FVC-Post L 3.39   FVC-Predicted Post % 67   Pre FEV1/FVC % % 83   Post FEV1/FCV % % 80   FEV1-Pre L 2.74   FEV1-Predicted Pre % 74   FEV1-Post L 2.72   DLCO uncorrected ml/min/mmHg 17.59   DLCO UNC% % 46   DLCO corrected ml/min/mmHg 17.59   DLCO COR %Predicted % 46   DLVA Predicted % 68   TLC L 5.56   TLC % Predicted % 70   RV % Predicted % 72    I have personally reviewed the patient's PFTs and mild restriction to ventilation likely secondary to body habitus.   Labs: Lab Results  Component Value Date   WBC 6.2 07/19/2021   HGB 10.2 (L) 07/19/2021   HCT 32.9 (L) 07/19/2021   MCV 83 07/19/2021   PLT 270 07/19/2021   Lab Results   Component Value Date   NA 142 08/04/2021   K 4.5 08/04/2021   CL 102 08/04/2021   CO2 17 (L) 08/04/2021     Immunization status: Immunization History  Administered Date(s) Administered   Fluad Quad(high Dose 65+) 12/16/2018, 12/04/2019, 01/25/2021, 10/25/2021   Influenza Split 12/11/2012   Influenza, High Dose Seasonal PF 11/30/2016   Influenza, Seasonal, Injecte, Preservative Fre 12/07/2015   Influenza,trivalent, recombinat, inj, PF 12/21/2017   Influenza-Unspecified 11/28/2007, 11/27/2008, 11/27/2009, 10/29/2010, 10/29/2011, 12/02/2013, 02/10/2015, 12/06/2016   Alphonsa Overall (  J&J) SARS-COV-2 Vaccination 05/12/2019, 03/05/2020   Pneumococcal Conjugate-13 02/12/2014   Pneumococcal Polysaccharide-23 08/25/2021   Pneumococcal-Unspecified 04/15/2009   Td 08/25/2021   Td (Adult),5 Lf Tetanus Toxid, Preservative Free 08/25/2021   Tdap 02/07/2011   Zoster Recombinat (Shingrix) 07/01/2020, 09/01/2020    External Records Personally Reviewed: cardiology  Assessment:  Dyspnea on exertion, likely multifactorial Chronic HFpEF Probable OSA  Plan/Recommendations:  I am happy to see you back as needed. Please call us back I we can help.   I think your shortness of breath is a combination of heart disease, limited mobility, weight gain and probable sleep apnea.   You can continue the xopanex inhaler as needed.    Return to Care: Return if symptoms worsen or fail to improve.   Lenice Llamas, MD Pulmonary and [place]

## 2021-12-07 NOTE — Patient Instructions (Addendum)
I am happy to see you back as needed. Please call us back I we can help.   I think your shortness of breath is a combination of heart disease, limited mobility, weight gain and probable sleep apnea.   You can continue the xopanex inhaler as needed.

## 2021-12-16 ENCOUNTER — Ambulatory Visit (INDEPENDENT_AMBULATORY_CARE_PROVIDER_SITE_OTHER): Payer: Medicare Other | Admitting: Podiatry

## 2021-12-16 ENCOUNTER — Encounter: Payer: Self-pay | Admitting: Podiatry

## 2021-12-16 DIAGNOSIS — B351 Tinea unguium: Secondary | ICD-10-CM

## 2021-12-16 DIAGNOSIS — M79676 Pain in unspecified toe(s): Secondary | ICD-10-CM

## 2021-12-16 DIAGNOSIS — D689 Coagulation defect, unspecified: Secondary | ICD-10-CM | POA: Diagnosis not present

## 2021-12-16 DIAGNOSIS — N183 Chronic kidney disease, stage 3 unspecified: Secondary | ICD-10-CM

## 2021-12-16 NOTE — Progress Notes (Signed)
This patient returns to my office for at risk foot care.  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  This patient is unable to cut nails himself since the patient cannot reach his nails.These nails are painful walking and wearing shoes.  This patient presents for at risk foot care today.  General Appearance  Alert, conversant and in no acute stress.  Vascular  Dorsalis pedis and posterior tibial  pulses are palpable  bilaterally.  Capillary return is within normal limits  bilaterally. Temperature is within normal limits  bilaterally.  Neurologic  Senn-Weinstein monofilament wire test within normal limits  bilaterally. Muscle power within normal limits bilaterally.  Nails Thick disfigured discolored nails with subungual debris  from hallux to fifth toes bilaterally. No evidence of bacterial infection or drainage bilaterally.   Orthopedic  No limitations of motion  feet .  No crepitus or effusions noted.  No bony pathology or digital deformities noted.  Skin  normotropic skin with no porokeratosis noted bilaterally.  No signs of infections or ulcers noted.     Onychomycosis  Pain in right toes  Pain in left toes  Consent was obtained for treatment procedures.   Mechanical debridement of nails 1-5  bilaterally performed with a nail nipper.  Filed with dremel without incident.    Return office visit   3 months                   Told patient to return for periodic foot care and evaluation due to potential at risk complications.   Kalasia Crafton DPM  

## 2021-12-20 ENCOUNTER — Ambulatory Visit: Payer: Self-pay

## 2021-12-20 ENCOUNTER — Encounter: Payer: Self-pay | Admitting: Family Medicine

## 2021-12-20 ENCOUNTER — Ambulatory Visit (INDEPENDENT_AMBULATORY_CARE_PROVIDER_SITE_OTHER): Payer: Medicare Other | Admitting: Family Medicine

## 2021-12-20 VITALS — BP 118/60 | Ht 74.0 in | Wt 275.0 lb

## 2021-12-20 DIAGNOSIS — M1711 Unilateral primary osteoarthritis, right knee: Secondary | ICD-10-CM | POA: Diagnosis not present

## 2021-12-20 MED ORDER — TRIAMCINOLONE ACETONIDE 32 MG IX SRER
32.0000 mg | Freq: Once | INTRA_ARTICULAR | Status: AC
  Administered 2021-12-20: 32 mg via INTRA_ARTICULAR

## 2021-12-20 NOTE — Patient Instructions (Signed)
Good to see you Please use ice as needed  Please continue the exercises   Please send me a message in MyChart with any questions or updates.  Please see me back in 3 months.   --Dr. Dail Lerew  

## 2021-12-20 NOTE — Assessment & Plan Note (Signed)
Acute on chronic in nature.  Pain related to his underlying degenerative change. -Counseled on home exercise therapy and supportive care. -Zilretta injection today. -Could consider physical therapy.

## 2021-12-20 NOTE — Progress Notes (Signed)
Jose Snyder - 81 y.o. male MRN [id number]  Date of birth: March 02, 1940  SUBJECTIVE:  Including CC & ROS.  No chief complaint on file.   Jose Snyder is a 81 y.o. male that is presenting with acute worsening of his right knee pain.  Has done well with the previous injection.  No injury or inciting event.   Review of Systems See HPI   HISTORY: Past Medical, Surgical, Social, and Family History Reviewed & Updated per EMR.   Pertinent Historical Findings include:  Past Medical History:  Diagnosis Date   Anxiety    Arthritis    R hand- OA   Atrial Fibrillation  06/25/2013   CAD (coronary artery disease), native coronary artery 06/28/2013   a. LHC (06/27/13):  Mid LAD 40%, mid D1 75%, mid CFX 50-60%, mid RCA 50%, EF 20%, LVEDP 24 mmHg.   CHF (congestive heart failure) (HCC)    Chronic combined systolic and diastolic heart failure ([place]) 06/26/2013   a.  Echo (06/25/13):  EF 20-25%, diff HK, restrictive physio, mild MR, mod to severe LAE, mild RVE, mildly reduced RVSF, mod to severe RAE   Chronic kidney disease    Dyslipidemia 07/15/2013   Dysrhythmia    a-fib   GERD (gastroesophageal reflux disease)    Hypertension    Incidental pulmonary nodule 06/22/2014   Seen on CT angiogram of chest   NICM (nonischemic cardiomyopathy) with EF 20-25% 06/26/2013   Obesity    Penetrating atherosclerotic ulcer of aorta ([place]) 10/22/2013   Small penetrating ulcer of descending thoracic aorta discovered on routine CTA   Prostate cancer ([place])    watchful waiting, no treatment so far   S/P Maze operation for atrial fibrillation 10/29/2013   Complete bilateral atrial lesion set using cryothermy and bipolar radiofrequency ablation with clipping of LA appendage via right mini thoracotomy   Shortness of breath    seen by Stanford Breed, & low energy     Past Surgical History:  Procedure Laterality Date   CARDIAC CATHETERIZATION  06/2013   CARDIOVERSION N/A 06/30/2013   Procedure: CARDIOVERSION;  Surgeon: [name], MD;   Location: [place];  Service: Cardiovascular;  Laterality: N/A;   CARDIOVERSION N/A 07/31/2013   Procedure: CARDIOVERSION;  Surgeon: Thayer Headings, MD;  Location: [place];  Service: Cardiovascular;  Laterality: N/A;   CARDIOVERSION N/A 03/19/2018   Procedure: CARDIOVERSION;  Surgeon: [name], MD;  Location: [place];  Service: Cardiovascular;  Laterality: N/A;   CARDIOVERSION N/A 07/24/2018   Procedure: CARDIOVERSION;  Surgeon: [name], MD;  Location: [facility] ENDOSCOPY;  Service: Cardiovascular;  Laterality: N/A;   CLIPPING OF ATRIAL APPENDAGE  10/29/2013   Procedure: CLIPPING OF ATRIAL APPENDAGE;  Surgeon: Rexene Alberts, MD;  Location: [place];  Service: Open Heart Surgery;;   INTRAOPERATIVE TRANSESOPHAGEAL ECHOCARDIOGRAM N/A 10/29/2013   Procedure: INTRAOPERATIVE TRANSESOPHAGEAL ECHOCARDIOGRAM;  Surgeon: Rexene Alberts, MD;  Location: [place];  Service: Open Heart Surgery;  Laterality: N/A;   LEFT AND RIGHT HEART CATHETERIZATION WITH CORONARY ANGIOGRAM N/A 06/27/2013   Procedure: LEFT AND RIGHT HEART CATHETERIZATION WITH CORONARY ANGIOGRAM;  Surgeon: Jettie Booze, MD;  Location: [facility] CATH LAB;  Service: Cardiovascular;  Laterality: N/A;   MEDIAL PARTIAL KNEE REPLACEMENT Left 1990's?   MINIMALLY INVASIVE MAZE PROCEDURE N/A 10/29/2013   Procedure: MINIMALLY INVASIVE MAZE PROCEDURE;  Surgeon: Rexene Alberts, MD;  Location: [place];  Service: Open Heart Surgery;  Laterality: N/A;   TEE WITHOUT CARDIOVERSION N/A 06/30/2013   Procedure:  TRANSESOPHAGEAL ECHOCARDIOGRAM (TEE) ;  Surgeon: [name], MD;  Location: [facility] ENDOSCOPY;  Service: Cardiovascular;  Laterality: N/A;   TRANSURETHRAL RESECTION OF PROSTATE  2010     PHYSICAL EXAM:  VS: BP 118/60 (BP Location: Left Arm, Patient Position: Sitting)   Ht '6\' 2"'$  (1.88 m)   Wt 275 lb (124.7 kg)   BMI 35.31 kg/m  Physical Exam Gen: NAD, alert, cooperative with exam, well-appearing MSK:  Neurovascularly intact      Aspiration/Injection Procedure Note Oscar Wellbrock 1940/08/08  Procedure: Injection Indications: right knee pain  Procedure Details Consent: Risks of procedure as well as the alternatives and risks of each were explained to the (patient/caregiver).  Consent for procedure obtained. Time Out: Verified patient identification, verified procedure, site/side was marked, verified correct patient position, special equipment/implants available, medications/allergies/relevent history reviewed, required imaging and test results available.  Performed.  The area was cleaned with iodine and alcohol swabs.    The right knee superior lateral suprapatellar pouch was injected using 3 cc of 1% lidocaine on a 25-gauge 1-1/2 inch needle.   The syringe was switched and a mixture containing 5 cc's of 32 mg Zilretta and 4 cc's of 0.25% bupivacaine was injected.  Ultrasound was used. Images were obtained in long views showing the injection.    A sterile dressing was applied.  Patient did tolerate procedure well.       ASSESSMENT & PLAN:   OA (osteoarthritis) of knee Acute on chronic in nature.  Pain related to his underlying degenerative change. -Counseled on home exercise therapy and supportive care. -Zilretta injection today. -Could consider physical therapy.

## 2021-12-22 ENCOUNTER — Telehealth: Payer: Self-pay

## 2021-12-22 NOTE — Telephone Encounter (Signed)
I called the patient to speak with him about the [employer] device. I LVM for patient to return my call.

## 2021-12-22 NOTE — Telephone Encounter (Signed)
Pt stated using Xopenex PRN in [place] on 12/07/21. Nothing further needed at this time.

## 2021-12-23 ENCOUNTER — Other Ambulatory Visit: Payer: Self-pay | Admitting: Cardiology

## 2021-12-23 DIAGNOSIS — I480 Paroxysmal atrial fibrillation: Secondary | ICD-10-CM

## 2022-01-10 ENCOUNTER — Telehealth: Payer: Self-pay

## 2022-01-10 NOTE — Telephone Encounter (Signed)
[employer] [place], [place] form completed and faxed to [phone], [place] phone number [phone]

## 2022-03-01 ENCOUNTER — Telehealth: Payer: Self-pay

## 2022-03-01 NOTE — Telephone Encounter (Signed)
Patient returned my call stating that he think he want to return the device because he has been trying to change his habits with his sleeping and it seems to be working. He states that he has an appointment coming up with Dr. Stanford Breed and that if he hasn't decided to use the device that he will return it at his visit.

## 2022-03-01 NOTE — Telephone Encounter (Signed)
I LVM FOR PATIENT TO RETURN MY CALL CONCERNING HIS [facility] DEVICE

## 2022-03-22 ENCOUNTER — Ambulatory Visit (INDEPENDENT_AMBULATORY_CARE_PROVIDER_SITE_OTHER): Payer: Medicare Other | Admitting: Podiatry

## 2022-03-22 ENCOUNTER — Encounter: Payer: Self-pay | Admitting: Podiatry

## 2022-03-22 DIAGNOSIS — M79676 Pain in unspecified toe(s): Secondary | ICD-10-CM

## 2022-03-22 DIAGNOSIS — B351 Tinea unguium: Secondary | ICD-10-CM

## 2022-03-22 DIAGNOSIS — N183 Chronic kidney disease, stage 3 unspecified: Secondary | ICD-10-CM

## 2022-03-22 NOTE — Progress Notes (Signed)
This patient returns to my office for at risk foot care.  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  This patient is unable to cut nails himself since the patient cannot reach his nails.These nails are painful walking and wearing shoes.  This patient presents for at risk foot care today.  General Appearance  Alert, conversant and in no acute stress.  Vascular  Dorsalis pedis and posterior tibial  pulses are palpable  bilaterally.  Capillary return is within normal limits  bilaterally. Temperature is within normal limits  bilaterally.  Neurologic  Senn-Weinstein monofilament wire test within normal limits  bilaterally. Muscle power within normal limits bilaterally.  Nails Thick disfigured discolored nails with subungual debris  from hallux to fifth toes bilaterally. No evidence of bacterial infection or drainage bilaterally.   Orthopedic  No limitations of motion  feet .  No crepitus or effusions noted.  No bony pathology or digital deformities noted.  Skin  normotropic skin with no porokeratosis noted bilaterally.  No signs of infections or ulcers noted.     Onychomycosis  Pain in right toes  Pain in left toes  Consent was obtained for treatment procedures.   Mechanical debridement of nails 1-5  bilaterally performed with a nail nipper.  Filed with dremel without incident.    Return office visit   3 months                   Told patient to return for periodic foot care and evaluation due to potential at risk complications.   [name] DPM

## 2022-03-23 NOTE — Progress Notes (Signed)
HPI: FU atrial fibrillation. Echo 4/15 EF 20-25%. LHC demonstrated mod non-obs CAD. Most significant lesion was a mid D1 with 75% stenosis. DCM was felt to be out of proportion to CAD (NICM). Had DCCV on amiodarone but did not hold sinus. He was seen by Dr. Rayann Heman and referred to Dr. Roxy Manns for surgical maze. Carotid Dopplers August 2015 showed 1-39% bilateral stenosis. Patient underwent maze procedure with clipping of left atrial appendage on October 29, 2013. CTA April 2018 showed focal saccular outpouching of the distal descending thoracic aorta with no intramural hematoma or inflammatory changes.  Findings felt possibly early ulcer formation and not a penetrating ulcer per se.  Images were reviewed by Dr. Roxy Manns and no further follow-up recommended. Echocardiogram June 2023 showed normal LV function, moderate left atrial enlargement, mildly dilated ascending aorta at 39 mm.   Since last seen, he has dyspnea with moderate activities but not routine activities.  No orthopnea or PND.  Occasional mild pedal edema towards the end of the day.  He denies chest pain or syncope.  No bleeding or falls.  Current Outpatient Medications  Medication Sig Dispense Refill   amiodarone (PACERONE) 200 MG tablet TAKE ONE (1) TABLET BY MOUTH EVERY DAY 90 tablet 3   apixaban (ELIQUIS) 2.5 MG TABS tablet Take 1 tablet (2.5 mg total) by mouth 2 (two) times daily. 180 tablet 3   atorvastatin (LIPITOR) 80 MG tablet Take 1 tablet (80 mg total) by mouth daily with breakfast. 30 tablet 12   calcitRIOL (ROCALTROL) 0.25 MCG capsule TAKE ONE CAPSULE BY MOUTH DAILY (STOP TAKING CHOLECALCIFEROL)     chlorhexidine (PERIDEX) 0.12 % solution RINSE 1/2 OUNCE (15ML) BY MOUTH TWICE A DAY AS DIRECTED BY YOUR MEDICAL PROVIDER     dapagliflozin propanediol (FARXIGA) 10 MG TABS tablet Take 1 tablet (10 mg total) by mouth daily before breakfast. 90 tablet 3   docusate sodium (COLACE) 100 MG capsule Take 400 mg by mouth at bedtime as needed  for mild constipation.      empagliflozin (JARDIANCE) 25 MG TABS tablet TAKE ONE-HALF TABLET BY MOUTH EVERY MORNING FOR HEART FAIURE     ferrous sulfate 325 (65 FE) MG tablet Take 325 mg by mouth as directed. TAKE 1 TAB M/W/F     levalbuterol (XOPENEX HFA) 45 MCG/ACT inhaler Inhale 2 puffs into the lungs every 6 (six) hours as needed for wheezing. 1 each 12   lisinopril (PRINIVIL,ZESTRIL) 40 MG tablet Take 40 mg by mouth daily.     Melatonin 3 MG CAPS Take 3 mg by mouth at bedtime.     metoprolol succinate (TOPROL-XL) 200 MG 24 hr tablet Take 1 tablet (200 mg total) by mouth daily. Take with or immediately following a meal. 90 tablet 3   omeprazole (PRILOSEC) 20 MG capsule Take 20 mg by mouth daily.     temazepam (RESTORIL) 15 MG capsule Take 15 mg by mouth at bedtime as needed for sleep.     torsemide (DEMADEX) 20 MG tablet Take 2 tablets (40 mg total) by mouth daily. 180 tablet 3   amLODipine (NORVASC) 10 MG tablet Take 1 tablet (10 mg total) by mouth daily at 6 PM. 90 tablet 3   Current Facility-Administered Medications  Medication Dose Route Frequency Provider Last Rate Last Admin   sodium chloride flush (NS) 0.9 % injection 3 mL  3 mL Intravenous Q12H Lelon Perla, MD         Past Medical History:  Diagnosis Date  Anxiety    Arthritis    R hand- OA   Atrial Fibrillation  06/25/2013   CAD (coronary artery disease), native coronary artery 06/28/2013   a. LHC (06/27/13):  Mid LAD 40%, mid D1 75%, mid CFX 50-60%, mid RCA 50%, EF 20%, LVEDP 24 mmHg.   CHF (congestive heart failure) (HCC)    Chronic combined systolic and diastolic heart failure ([place]) 06/26/2013   a.  Echo (06/25/13):  EF 20-25%, diff HK, restrictive physio, mild MR, mod to severe LAE, mild RVE, mildly reduced RVSF, mod to severe RAE   Chronic kidney disease    Dyslipidemia 07/15/2013   Dysrhythmia    a-fib   GERD (gastroesophageal reflux disease)    Hypertension    Incidental pulmonary nodule 06/22/2014   Seen on CT  angiogram of chest   NICM (nonischemic cardiomyopathy) with EF 20-25% 06/26/2013   Obesity    Penetrating atherosclerotic ulcer of aorta ([place]) 10/22/2013   Small penetrating ulcer of descending thoracic aorta discovered on routine CTA   Prostate cancer ([place])    watchful waiting, no treatment so far   S/P Maze operation for atrial fibrillation 10/29/2013   Complete bilateral atrial lesion set using cryothermy and bipolar radiofrequency ablation with clipping of LA appendage via right mini thoracotomy   Shortness of breath    seen by Stanford Breed, & low energy     Past Surgical History:  Procedure Laterality Date   CARDIAC CATHETERIZATION  06/2013   CARDIOVERSION N/A 06/30/2013   Procedure: CARDIOVERSION;  Surgeon: [name], MD;  Location: [place];  Service: Cardiovascular;  Laterality: N/A;   CARDIOVERSION N/A 07/31/2013   Procedure: CARDIOVERSION;  Surgeon: Thayer Headings, MD;  Location: [place];  Service: Cardiovascular;  Laterality: N/A;   CARDIOVERSION N/A 03/19/2018   Procedure: CARDIOVERSION;  Surgeon: [name], MD;  Location: [place];  Service: Cardiovascular;  Laterality: N/A;   CARDIOVERSION N/A 07/24/2018   Procedure: CARDIOVERSION;  Surgeon: [name], MD;  Location: [facility] ENDOSCOPY;  Service: Cardiovascular;  Laterality: N/A;   CLIPPING OF ATRIAL APPENDAGE  10/29/2013   Procedure: CLIPPING OF ATRIAL APPENDAGE;  Surgeon: Rexene Alberts, MD;  Location: [place];  Service: Open Heart Surgery;;   INTRAOPERATIVE TRANSESOPHAGEAL ECHOCARDIOGRAM N/A 10/29/2013   Procedure: INTRAOPERATIVE TRANSESOPHAGEAL ECHOCARDIOGRAM;  Surgeon: Rexene Alberts, MD;  Location: [place];  Service: Open Heart Surgery;  Laterality: N/A;   LEFT AND RIGHT HEART CATHETERIZATION WITH CORONARY ANGIOGRAM N/A 06/27/2013   Procedure: LEFT AND RIGHT HEART CATHETERIZATION WITH CORONARY ANGIOGRAM;  Surgeon: Jettie Booze, MD;  Location: [facility] CATH LAB;  Service: Cardiovascular;  Laterality: N/A;   MEDIAL  PARTIAL KNEE REPLACEMENT Left 1990's?   MINIMALLY INVASIVE MAZE PROCEDURE N/A 10/29/2013   Procedure: MINIMALLY INVASIVE MAZE PROCEDURE;  Surgeon: Rexene Alberts, MD;  Location: [place];  Service: Open Heart Surgery;  Laterality: N/A;   TEE WITHOUT CARDIOVERSION N/A 06/30/2013   Procedure: TRANSESOPHAGEAL ECHOCARDIOGRAM (TEE) ;  Surgeon: [name], MD;  Location: [place];  Service: Cardiovascular;  Laterality: N/A;   TRANSURETHRAL RESECTION OF PROSTATE  2010    Social History   Socioeconomic History   Marital status: Widowed    Spouse name: Not on file   Number of children: Not on file   Years of education: Not on file   Highest education level: Not on file  Occupational History   Not on file  Tobacco Use   Smoking status: Never   Smokeless tobacco: Never  Tobacco comments:    06/25/2013 "smoked very light; years and years ago"  Vaping Use   Vaping Use: Never used  Substance and Sexual Activity   Alcohol use: Yes    Alcohol/week: 14.0 standard drinks of alcohol    Types: 7 Glasses of wine, 7 Shots of liquor per week   Drug use: No   Sexual activity: Not Currently  Other Topics Concern   Not on file  Social History Narrative   He lives in [place]. He is widowed. Wife passed of breast cancer last year. No children.    Social Determinants of Health   Financial Resource Strain: Not on file  Food Insecurity: Not on file  Transportation Needs: Not on file  Physical Activity: Not on file  Stress: Not on file  Social Connections: Not on file  Intimate Partner Violence: Not on file    Family History  Problem Relation Age of Onset   Healthy Mother    Healthy Father    Healthy Sister    Lung cancer Neg Hx    Lung disease Neg Hx     ROS: no fevers or chills, productive cough, hemoptysis, dysphasia, odynophagia, melena, hematochezia, dysuria, hematuria, rash, seizure activity, orthopnea, PND, pedal edema, claudication. Remaining systems are negative.  Physical  Exam: Well-developed well-nourished in no acute distress.  Skin is warm and dry.  HEENT is normal.  Neck is supple.  Chest is clear to auscultation with normal expansion.  Cardiovascular exam is regular rate and rhythm.  Abdominal exam nontender or distended. No masses palpated. Extremities show trace edema. neuro grossly intact  ECG-normal sinus rhythm, right axis deviation, low voltage.  Personally reviewed  A/P  1 paroxysmal atrial fibrillation-patient remains in sinus rhythm.  Continue amiodarone and apixaban.  Check TSH, liver functions, hemoglobin, renal function and chest x-ray.  2 chronic diastolic congestive heart failure-continue Lasix and Farxiga at present dose.  Check potassium and renal function.  3 history of nonischemic cardiomyopathy-LV function has normalized on most recent echocardiogram.  Continue ACE inhibitor and beta-blocker.  4 hypertension-blood pressure controlled.  Continue present medications and follow.  5 hyperlipidemia-continue statin.  6 coronary artery disease-patient denies chest pain.  Continue statin.  No aspirin given need for anticoagulation.  7 penetrating atherosclerotic aortic ulcer-continue statin.  He was seen previously by Dr. Roxy Manns and medical therapy recommended without follow-up imaging.  8 chronic stage III kidney disease-continue to follow renal function.  9 history of snoring-patient did not perform sleep study previously.  We will rearrange.  Darrell Bliesner present during exam  Kirk Ruths, MD

## 2022-04-05 ENCOUNTER — Ambulatory Visit
Admission: RE | Admit: 2022-04-05 | Discharge: 2022-04-05 | Disposition: A | Discharge status: Home / Self Care | Payer: Medicare Other | Source: Ambulatory Visit | Attending: Cardiology | Admitting: Cardiology

## 2022-04-05 ENCOUNTER — Encounter: Payer: Self-pay | Admitting: Cardiology

## 2022-04-05 ENCOUNTER — Ambulatory Visit (INDEPENDENT_AMBULATORY_CARE_PROVIDER_SITE_OTHER): Payer: Medicare Other | Admitting: Cardiology

## 2022-04-05 VITALS — BP 98/60 | HR 73 | Ht 74.0 in | Wt 269.0 lb

## 2022-04-05 DIAGNOSIS — E7800 Pure hypercholesterolemia, unspecified: Secondary | ICD-10-CM | POA: Insufficient documentation

## 2022-04-05 DIAGNOSIS — I5032 Chronic diastolic (congestive) heart failure: Secondary | ICD-10-CM | POA: Insufficient documentation

## 2022-04-05 DIAGNOSIS — R0683 Snoring: Secondary | ICD-10-CM | POA: Diagnosis not present

## 2022-04-05 DIAGNOSIS — J849 Interstitial pulmonary disease, unspecified: Secondary | ICD-10-CM | POA: Diagnosis not present

## 2022-04-05 DIAGNOSIS — I719 Aortic aneurysm of unspecified site, without rupture: Secondary | ICD-10-CM

## 2022-04-05 DIAGNOSIS — I2510 Atherosclerotic heart disease of native coronary artery without angina pectoris: Secondary | ICD-10-CM | POA: Insufficient documentation

## 2022-04-05 DIAGNOSIS — I771 Stricture of artery: Secondary | ICD-10-CM | POA: Diagnosis not present

## 2022-04-05 DIAGNOSIS — I517 Cardiomegaly: Secondary | ICD-10-CM | POA: Diagnosis not present

## 2022-04-05 DIAGNOSIS — I700 Atherosclerosis of aorta: Secondary | ICD-10-CM | POA: Diagnosis not present

## 2022-04-05 DIAGNOSIS — I480 Paroxysmal atrial fibrillation: Secondary | ICD-10-CM | POA: Diagnosis not present

## 2022-04-05 DIAGNOSIS — I10 Essential (primary) hypertension: Secondary | ICD-10-CM | POA: Insufficient documentation

## 2022-04-05 NOTE — Progress Notes (Signed)
Examination chaperoned by Maryan Rued on 04/05/22 @ [phone].

## 2022-04-05 NOTE — Patient Instructions (Addendum)
A chest x-ray takes a picture of the organs and structures inside the chest, including the heart, lungs, and blood vessels. This test can show several things, including, whether the heart is enlarges; whether fluid is building up in the lungs; and whether pacemaker / defibrillator leads are still in place. [place] 1ST FLOOR IMAGING  Follow-Up: At [facility], you and your health needs are our priority.  As part of our continuing mission to provide you with exceptional heart care, we have created designated Provider Care Teams.  These Care Teams include your primary Cardiologist (physician) and Advanced Practice Providers (APPs -  Physician Assistants and Nurse Practitioners) who all work together to provide you with the care you need, when you need it.  We recommend signing up for the patient portal called "MyChart".  Sign up information is provided on this After Visit Summary.  MyChart is used to connect with patients for Virtual Visits (Telemedicine).  Patients are able to view lab/test results, encounter notes, upcoming appointments, etc.  Non-urgent messages can be sent to your provider as well.   To learn more about what you can do with MyChart, go to [email or web address].    Your next appointment:   6 month(s)  Provider:   Kirk Ruths, MD

## 2022-04-06 LAB — CBC
Hematocrit: 41.4 % (ref 37.5–51.0)
Hemoglobin: 13.5 g/dL (ref 13.0–17.7)
MCH: 30.8 pg (ref 26.6–33.0)
MCHC: 32.6 g/dL (ref 31.5–35.7)
MCV: 94 fL (ref 79–97)
Platelets: 243 10*3/uL (ref 150–450)
RBC: 4.39 x10E6/uL (ref 4.14–5.80)
RDW: 13.4 % (ref 11.6–15.4)
WBC: 6.6 10*3/uL (ref 3.4–10.8)

## 2022-04-06 LAB — LIPID PANEL
Chol/HDL Ratio: 4.9 ratio (ref 0.0–5.0)
Cholesterol, Total: 141 mg/dL (ref 100–199)
HDL: 29 mg/dL — ABNORMAL LOW (ref >39)
LDL Chol Calc (NIH): 65 mg/dL (ref 0–99)
Triglycerides: 296 mg/dL — ABNORMAL HIGH (ref 0–149)
VLDL Cholesterol Cal: 47 mg/dL — ABNORMAL HIGH (ref 5–40)

## 2022-04-06 LAB — COMPREHENSIVE METABOLIC PANEL
ALT: 20 IU/L (ref 0–44)
AST: 26 IU/L (ref 0–40)
Albumin/Globulin Ratio: 1.6 (ref 1.2–2.2)
Albumin: 4.2 g/dL (ref 3.8–4.8)
Alkaline Phosphatase: 94 IU/L (ref 44–121)
BUN/Creatinine Ratio: 11 (ref 10–24)
BUN: 22 mg/dL (ref 8–27)
Bilirubin Total: 0.3 mg/dL (ref 0.0–1.2)
CO2: 22 mmol/L (ref 20–29)
Calcium: 9.3 mg/dL (ref 8.6–10.2)
Chloride: 100 mmol/L (ref 96–106)
Creatinine, Ser: 2.09 mg/dL — ABNORMAL HIGH (ref 0.76–1.27)
Globulin, Total: 2.6 g/dL (ref 1.5–4.5)
Glucose: 131 mg/dL — ABNORMAL HIGH (ref 70–99)
Potassium: 5.3 mmol/L — ABNORMAL HIGH (ref 3.5–5.2)
Sodium: 139 mmol/L (ref 134–144)
Total Protein: 6.8 g/dL (ref 6.0–8.5)
eGFR: 31 mL/min/{1.73_m2} — ABNORMAL LOW (ref >59)

## 2022-04-06 LAB — TSH: TSH: 2.86 u[IU]/mL (ref 0.450–4.500)

## 2022-04-10 ENCOUNTER — Encounter: Payer: Self-pay | Admitting: *Deleted

## 2022-04-25 ENCOUNTER — Encounter: Payer: Self-pay | Admitting: Family Medicine

## 2022-04-25 ENCOUNTER — Ambulatory Visit: Payer: Self-pay

## 2022-04-25 ENCOUNTER — Ambulatory Visit (INDEPENDENT_AMBULATORY_CARE_PROVIDER_SITE_OTHER): Payer: Medicare Other | Admitting: Family Medicine

## 2022-04-25 VITALS — BP 140/62 | Ht 74.0 in | Wt 265.0 lb

## 2022-04-25 DIAGNOSIS — M1711 Unilateral primary osteoarthritis, right knee: Secondary | ICD-10-CM

## 2022-04-25 MED ORDER — TRIAMCINOLONE ACETONIDE 32 MG IX SRER
32.0000 mg | Freq: Once | INTRA_ARTICULAR | Status: AC
  Administered 2022-04-25: 32 mg via INTRA_ARTICULAR

## 2022-04-25 NOTE — Patient Instructions (Signed)
Good to see you Please use ice as needed  Please continue the exercises   Please send me a message in MyChart with any questions or updates.  Please see me back in 3-4 months.   --Dr. Raeford Razor

## 2022-04-25 NOTE — Progress Notes (Signed)
Jose Snyder - 82 y.o. male MRN [id number]  Date of birth: [birth year]  SUBJECTIVE:  Including CC & ROS.  No chief complaint on file.   Jose Snyder is a 82 y.o. male that is presenting with acute on chronic right knee pain.  The pain started the past few weeks.  Has some swelling but was able to improve the pain over the past weekend.  No injury inciting event.   Review of Systems See HPI   HISTORY: Past Medical, Surgical, Social, and Family History Reviewed & Updated per EMR.   Pertinent Historical Findings include:  Past Medical History:  Diagnosis Date   Anxiety    Arthritis    R hand- OA   Atrial Fibrillation  06/25/2013   CAD (coronary artery disease), native coronary artery 06/28/2013   a. LHC (06/27/13):  Mid LAD 40%, mid D1 75%, mid CFX 50-60%, mid RCA 50%, EF 20%, LVEDP 24 mmHg.   CHF (congestive heart failure) (HCC)    Chronic combined systolic and diastolic heart failure ([place]) 06/26/2013   a.  Echo (06/25/13):  EF 20-25%, diff HK, restrictive physio, mild MR, mod to severe LAE, mild RVE, mildly reduced RVSF, mod to severe RAE   Chronic kidney disease    Dyslipidemia 07/15/2013   Dysrhythmia    a-fib   GERD (gastroesophageal reflux disease)    Hypertension    Incidental pulmonary nodule 06/22/2014   Seen on CT angiogram of chest   NICM (nonischemic cardiomyopathy) with EF 20-25% 06/26/2013   Obesity    Penetrating atherosclerotic ulcer of aorta ([place]) 10/22/2013   Small penetrating ulcer of descending thoracic aorta discovered on routine CTA   Prostate cancer ([place])    watchful waiting, no treatment so far   S/P Maze operation for atrial fibrillation 10/29/2013   Complete bilateral atrial lesion set using cryothermy and bipolar radiofrequency ablation with clipping of LA appendage via right mini thoracotomy   Shortness of breath    seen by Stanford Breed, & low energy     Past Surgical History:  Procedure Laterality Date   CARDIAC CATHETERIZATION  06/2013   CARDIOVERSION N/A 06/30/2013    Procedure: CARDIOVERSION;  Surgeon: [name], MD;  Location: [place];  Service: Cardiovascular;  Laterality: N/A;   CARDIOVERSION N/A 07/31/2013   Procedure: CARDIOVERSION;  Surgeon: Thayer Headings, MD;  Location: [place];  Service: Cardiovascular;  Laterality: N/A;   CARDIOVERSION N/A 03/19/2018   Procedure: CARDIOVERSION;  Surgeon: [name], MD;  Location: [place];  Service: Cardiovascular;  Laterality: N/A;   CARDIOVERSION N/A 07/24/2018   Procedure: CARDIOVERSION;  Surgeon: [name], MD;  Location[facility] ENDOSCOPY;  Service: Cardiovascular;  Laterality: N/A;   CLIPPING OF ATRIAL APPENDAGE  10/29/2013   Procedure: CLIPPING OF ATRIAL APPENDAGE;  Surgeon: Rexene Alberts, MD;  Location: [place];  Service: Open Heart Surgery;;   INTRAOPERATIVE TRANSESOPHAGEAL ECHOCARDIOGRAM N/A 10/29/2013   Procedure: INTRAOPERATIVE TRANSESOPHAGEAL ECHOCARDIOGRAM;  Surgeon: Rexene Alberts, MD;  Location: [place];  Service: Open Heart Surgery;  Laterality: N/A;   LEFT AND RIGHT HEART CATHETERIZATION WITH CORONARY ANGIOGRAM N/A 06/27/2013   Procedure: LEFT AND RIGHT HEART CATHETERIZATION WITH CORONARY ANGIOGRAM;  Surgeon: Jettie Booze, MD;  [facility] C[facility] LAB;  Service: Cardiovascular;  Laterality: N/A;   MEDIAL PARTIAL KNEE REPLACEMENT Left 1990's?   MINIMALLY INVASIVE MAZE PROCEDURE N/A 10/29/2013   Procedure: MINIMALLY INVASIVE MAZE PROCEDURE;  Surgeon: Rexene Alberts, MD;  Location: [place];  Service: Open Heart Surgery;  Laterality: N/A;   TEE WITHOUT CARDIOVERSION N/A 06/30/2013   Procedure: TRANSESOPHAGEAL ECHOCARDIOGRAM (TEE) ;  Surgeon: [name], MD;  Location: [facility] ENDOSCOPY;  Service: Cardiovascular;  Laterality: N/A;   TRANSURETHRAL RESECTION OF PROSTATE  2010     PHYSICAL EXAM:  VS: BP (!) 140/62   Ht '6\' 2"'$  (1.88 m)   Wt 265 lb (120.2 kg)   BMI 34.02 kg/m  Physical Exam Gen: NAD, alert, cooperative with exam, well-appearing MSK:  Neurovascularly intact      Aspiration/Injection Procedure Note Jose Snyder August 13, 1940  Procedure: Injection Indications: right knee pain  Procedure Details Consent: Risks of procedure as well as the alternatives and risks of each were explained to the (patient/caregiver).  Consent for procedure obtained. Time Out: Verified patient identification, verified procedure, site/side was marked, verified correct patient position, special equipment/implants available, medications/allergies/relevent history reviewed, required imaging and test results available.  Performed.  The area was cleaned with iodine and alcohol swabs.    The right knee superior lateral suprapatellar pouch was injected using 3 cc of 1% lidocaine on a 25-gauge 1-1/2 inch needle.   The syringe was switched and a mixture containing 5 cc's of 32 mg Zilretta and 4 cc's of 0.25% bupivacaine was injected.  Ultrasound was used. Images were obtained in long views showing the injection.    A sterile dressing was applied.  Patient did tolerate procedure well.       ASSESSMENT & PLAN:   OA (osteoarthritis) of knee Acute on chronic in nature.  He has exacerbation of the underlying degenerative changes.  No recent injury. -Counseled on home exercise therapy and supportive care. -Zilretta injection today. -He would like to hold off on the arthroplasty at this time.

## 2022-04-25 NOTE — Assessment & Plan Note (Signed)
Acute on chronic in nature.  He has exacerbation of the underlying degenerative changes.  No recent injury. -Counseled on home exercise therapy and supportive care. -Zilretta injection today. -He would like to hold off on the arthroplasty at this time.

## 2022-06-12 ENCOUNTER — Encounter: Payer: Self-pay | Admitting: *Deleted

## 2022-06-19 ENCOUNTER — Encounter: Payer: Self-pay | Admitting: Podiatry

## 2022-06-19 ENCOUNTER — Ambulatory Visit (INDEPENDENT_AMBULATORY_CARE_PROVIDER_SITE_OTHER): Payer: Medicare Other | Admitting: Podiatry

## 2022-06-19 DIAGNOSIS — B351 Tinea unguium: Secondary | ICD-10-CM | POA: Diagnosis not present

## 2022-06-19 DIAGNOSIS — M79676 Pain in unspecified toe(s): Secondary | ICD-10-CM | POA: Diagnosis not present

## 2022-06-19 DIAGNOSIS — N183 Chronic kidney disease, stage 3 unspecified: Secondary | ICD-10-CM

## 2022-06-19 NOTE — Progress Notes (Signed)
This patient returns to my office for at risk foot care.  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  This patient is unable to cut nails himself since the patient cannot reach his nails.These nails are painful walking and wearing shoes.  This patient presents for at risk foot care today.  General Appearance  Alert, conversant and in no acute stress.  Vascular  Dorsalis pedis and posterior tibial  pulses are palpable  bilaterally.  Capillary return is within normal limits  bilaterally. Temperature is within normal limits  bilaterally.  Neurologic  Senn-Weinstein monofilament wire test within normal limits  bilaterally. Muscle power within normal limits bilaterally.  Nails Thick disfigured discolored nails with subungual debris  from hallux to fifth toes bilaterally. No evidence of bacterial infection or drainage bilaterally.   Orthopedic  No limitations of motion  feet .  No crepitus or effusions noted.  No bony pathology or digital deformities noted.  Skin  normotropic skin with no porokeratosis noted bilaterally.  No signs of infections or ulcers noted.     Onychomycosis  Pain in right toes  Pain in left toes  Consent was obtained for treatment procedures.   Mechanical debridement of nails 1-5  bilaterally performed with a nail nipper.  Filed with dremel without incident. Nail third toe right foot is unattached .  Bandage applied.   Return office visit   3 months                   Told patient to return for periodic foot care and evaluation due to potential at risk complications.   Helane Gunther DPM

## 2022-06-23 ENCOUNTER — Ambulatory Visit: Payer: Medicare Other | Admitting: Podiatry

## 2022-09-18 ENCOUNTER — Ambulatory Visit (INDEPENDENT_AMBULATORY_CARE_PROVIDER_SITE_OTHER): Payer: Medicare Other | Admitting: Podiatry

## 2022-09-18 ENCOUNTER — Encounter: Payer: Self-pay | Admitting: Podiatry

## 2022-09-18 DIAGNOSIS — B351 Tinea unguium: Secondary | ICD-10-CM

## 2022-09-18 DIAGNOSIS — M79676 Pain in unspecified toe(s): Secondary | ICD-10-CM

## 2022-09-18 DIAGNOSIS — N183 Chronic kidney disease, stage 3 unspecified: Secondary | ICD-10-CM

## 2022-09-18 NOTE — Progress Notes (Signed)
This patient returns to my office for at risk foot care.  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  This patient is unable to cut nails himself since the patient cannot reach his nails.These nails are painful walking and wearing shoes.  This patient presents for at risk foot care today.  General Appearance  Alert, conversant and in no acute stress.  Vascular  Dorsalis pedis and posterior tibial  pulses are palpable  bilaterally.  Capillary return is within normal limits  bilaterally. Temperature is within normal limits  bilaterally.  Neurologic  Senn-Weinstein monofilament wire test within normal limits  bilaterally. Muscle power within normal limits bilaterally.  Nails Thick disfigured discolored nails with subungual debris  from hallux to fifth toes bilaterally. No evidence of bacterial infection or drainage bilaterally.  Third fourth right and third fourth left foot are unattached both feet.  Orthopedic  No limitations of motion  feet .  No crepitus or effusions noted.  No bony pathology or digital deformities noted.  Skin  normotropic skin with no porokeratosis noted bilaterally.  No signs of infections or ulcers noted.     Onychomycosis  Pain in right toes  Pain in left toes  Consent was obtained for treatment procedures.   Mechanical debridement of nails 1-5  bilaterally performed with a nail nipper.  Filed with dremel without incident. Third toe left foot bandaged. Told hin to peroxide his nails  B/L.   Return office visit   10 weeks                  Told patient to return for periodic foot care and evaluation due to potential at risk complications.   Helane Gunther DPM

## 2022-09-18 NOTE — Progress Notes (Addendum)
Jose Snyder D.[facility] [address] [place] [address] Phone: [phone]   Assessment and Plan:     1. Chronic pain of right knee 2. Primary osteoarthritis of right knee -Chronic with exacerbation, initial sports medicine visit - Consistent with flare of right knee osteoarthritis, primarily medial compartment - Patient has been receiving 4+ months of relief from Zilretta injections with last injection on 04/25/2022.  We discussed that since we are new office, we will need to submit prior authorization for Zilretta.  Patient verbalized understanding and wished to proceed with intra-articular CSI.  Tolerated well per note below.  Increased bleeding risk due to chronic anticoagulation on Eliquis - Start Tylenol 500 to 1000 mg tablets 2-3 times a day for day-to-day pain relief  -X-ray obtained in clinic.  My interpretation: No acute fracture or dislocation.  Interval worsening of osteoarthritis, particularly in medial compartment which has history of osteochondral lesion seen on MRI in 2022   Procedure: Knee Joint Injection Side: Right Indication: Flare of osteoarthritis  Risks explained and consent was given verbally. The site was cleaned with alcohol prep. A needle was introduced with an anterio-lateral approach. Injection given using 2mL of 1% lidocaine without epinephrine and 1mL of kenalog 40mg /ml. This was well tolerated and resulted in symptomatic relief.  Needle was removed, hemostasis achieved, and post injection instructions were explained.   Pt was advised to call or return to clinic if these symptoms worsen or fail to improve as anticipated.   Pertinent previous records reviewed include sports medicine note 04/25/2022, right knee MRI 06/19/2020, right knee x-ray 06/11/2020   Follow Up: 4 weeks for reevaluation.  Could perform Zilretta injection at that time   Subjective:   I, Jose Snyder, am serving as a Neurosurgeon for Doctor [name]  Chief Complaint: right knee pain   HPI:   09/19/2022 Patient is a 82 year old male complaining of right knee pain. Patient states he has been seeing schmidtz for shots doesn't remember the name. He would like a shot today it has been 4 months .   Relevant Historical Information: Atrial fibrillation with chronic anticoagulation on Eliquis, hypertension, CHF, CKD  Additional pertinent review of systems negative.   Current Outpatient Medications:    amiodarone (PACERONE) 200 MG tablet, TAKE ONE (1) TABLET BY MOUTH EVERY DAY, Disp: 90 tablet, Rfl: 3   apixaban (ELIQUIS) 2.5 MG TABS tablet, Take 1 tablet (2.5 mg total) by mouth 2 (two) times daily., Disp: 180 tablet, Rfl: 3   atorvastatin (LIPITOR) 80 MG tablet, Take 1 tablet (80 mg total) by mouth daily with breakfast., Disp: 30 tablet, Rfl: 12   calcitRIOL (ROCALTROL) 0.25 MCG capsule, TAKE ONE CAPSULE BY MOUTH DAILY (STOP TAKING CHOLECALCIFEROL), Disp: , Rfl:    docusate sodium (COLACE) 100 MG capsule, Take 400 mg by mouth at bedtime as needed for mild constipation. , Disp: , Rfl:    empagliflozin (JARDIANCE) 25 MG TABS tablet, TAKE ONE-HALF TABLET BY MOUTH EVERY MORNING FOR HEART FAIURE, Disp: , Rfl:    ferrous sulfate 325 (65 FE) MG tablet, Take 325 mg by mouth as directed. TAKE 1 TAB M/W/F, Disp: , Rfl:    levalbuterol (XOPENEX HFA) 45 MCG/ACT inhaler, Inhale 2 puffs into the lungs every 6 (six) hours as needed for wheezing., Disp: 1 each, Rfl: 12   lisinopril (PRINIVIL,ZESTRIL) 40 MG tablet, Take 40 mg by mouth daily., Disp: , Rfl:    Melatonin 3 MG CAPS, Take 3  mg by mouth at bedtime., Disp: , Rfl:    metoprolol succinate (TOPROL-XL) 200 MG 24 hr tablet, Take 100 mg by mouth daily. Take with or immediately following a meal., Disp: 90 tablet, Rfl: 3   torsemide (DEMADEX) 20 MG tablet, Take 2 tablets (40 mg total) by mouth daily., Disp: 180 tablet, Rfl: 3   amLODipine (NORVASC) 5 MG tablet, Take 1 tablet (5 mg total) by mouth  daily at 6 PM., Disp: , Rfl:   Current Facility-Administered Medications:    sodium chloride flush (NS) 0.9 % injection 3 mL, 3 mL, Intravenous, Q12H, Crenshaw, Madolyn Frieze, MD   Objective:     Vitals:   09/19/22 0756  BP: 108/70  Pulse: 66  SpO2: 98%  Weight: 258 lb (117 kg)  Height: 6\' 2"  (1.88 m)      Body mass index is 33.13 kg/m.    Physical Exam:    General:  awake, alert oriented, no acute distress nontoxic Skin: no suspicious lesions or rashes Neuro:sensation intact and strength 5/5 with no deficits, no atrophy, normal muscle tone Psych: No signs of anxiety, depression or other mood disorder  Right knee: Mild swelling No deformity Neg fluid wave, joint milking ROM Flex 100, Ext 0 TTP medial joint line, medial femoral condyle NTTP over the quad tendon,   lat fem condyle, patella, plica, patella tendon, tibial tuberostiy, fibular head, posterior fossa, pes anserine bursa, gerdy's tubercle,  lateral jt line Neg anterior and posterior drawer  crepitus present Gait normal    Electronically signed by:  Jose Snyder D.[facility] 8:32 AM 10/17/22

## 2022-09-19 ENCOUNTER — Ambulatory Visit (INDEPENDENT_AMBULATORY_CARE_PROVIDER_SITE_OTHER): Payer: Medicare Other

## 2022-09-19 ENCOUNTER — Ambulatory Visit: Payer: No Typology Code available for payment source | Admitting: Sports Medicine

## 2022-09-19 VITALS — BP 108/70 | HR 66 | Ht 74.0 in | Wt 258.0 lb

## 2022-09-19 DIAGNOSIS — M25561 Pain in right knee: Secondary | ICD-10-CM

## 2022-09-19 DIAGNOSIS — G8929 Other chronic pain: Secondary | ICD-10-CM | POA: Diagnosis not present

## 2022-09-19 DIAGNOSIS — M1711 Unilateral primary osteoarthritis, right knee: Secondary | ICD-10-CM

## 2022-09-19 DIAGNOSIS — M25461 Effusion, right knee: Secondary | ICD-10-CM | POA: Diagnosis not present

## 2022-09-19 NOTE — Patient Instructions (Signed)
4 week follow up if you are still having pain call us in 2 weeks Tylenol [phone] mg 2-3 times a day for pain relief  Zilretta approval was placed today

## 2022-09-21 NOTE — Progress Notes (Signed)
HPI: FU atrial fibrillation. Echo 4/15 EF 20-25%. LHC demonstrated mod non-obs CAD. Most significant lesion was a mid D1 with 75% stenosis. DCM was felt to be out of proportion to CAD (NICM). Had DCCV on amiodarone but did not hold sinus. He was seen by Dr. Johney Frame and referred to Dr. Cornelius Moras for surgical maze. Carotid Dopplers August 2015 showed 1-39% bilateral stenosis. Patient underwent maze procedure with clipping of left atrial appendage on October 29, 2013. CTA April 2018 showed focal saccular outpouching of the distal descending thoracic aorta with no intramural hematoma or inflammatory changes.  Findings felt possibly early ulcer formation and not a penetrating ulcer per se.  Images were reviewed by Dr. Cornelius Moras and no further follow-up recommended. Echocardiogram June 2023 showed normal LV function, moderate left atrial enlargement, mildly dilated ascending aorta at 39 mm.   Since last seen, he has dyspnea with more vigorous activities but not routine activities.  No orthopnea, PND, pedal edema, chest pain or syncope.  His amlodipine was decreased recently due to low blood pressure.  This is now improved by his report.  Current Outpatient Medications  Medication Sig Dispense Refill   amiodarone (PACERONE) 200 MG tablet TAKE ONE (1) TABLET BY MOUTH EVERY DAY 90 tablet 3   amLODipine (NORVASC) 5 MG tablet Take 1 tablet (5 mg total) by mouth daily at 6 PM.     apixaban (ELIQUIS) 2.5 MG TABS tablet Take 1 tablet (2.5 mg total) by mouth 2 (two) times daily. 180 tablet 3   atorvastatin (LIPITOR) 80 MG tablet Take 1 tablet (80 mg total) by mouth daily with breakfast. 30 tablet 12   calcitRIOL (ROCALTROL) 0.25 MCG capsule TAKE ONE CAPSULE BY MOUTH DAILY (STOP TAKING CHOLECALCIFEROL)     docusate sodium (COLACE) 100 MG capsule Take 400 mg by mouth at bedtime as needed for mild constipation.      empagliflozin (JARDIANCE) 25 MG TABS tablet TAKE ONE-HALF TABLET BY MOUTH EVERY MORNING FOR HEART FAIURE      ferrous sulfate 325 (65 FE) MG tablet Take 325 mg by mouth as directed. TAKE 1 TAB M/W/F     levalbuterol (XOPENEX HFA) 45 MCG/ACT inhaler Inhale 2 puffs into the lungs every 6 (six) hours as needed for wheezing. 1 each 12   lisinopril (PRINIVIL,ZESTRIL) 40 MG tablet Take 40 mg by mouth daily.     Melatonin 3 MG CAPS Take 3 mg by mouth at bedtime.     metoprolol succinate (TOPROL-XL) 200 MG 24 hr tablet Take 100 mg by mouth daily. Take with or immediately following a meal. 90 tablet 3   torsemide (DEMADEX) 20 MG tablet Take 2 tablets (40 mg total) by mouth daily. 180 tablet 3   Current Facility-Administered Medications  Medication Dose Route Frequency Provider Last Rate Last Admin   sodium chloride flush (NS) 0.9 % injection 3 mL  3 mL Intravenous Q12H Lewayne Bunting, MD         Past Medical History:  Diagnosis Date   Anxiety    Arthritis    R hand- OA   Atrial Fibrillation  06/25/2013   CAD (coronary artery disease), native coronary artery 06/28/2013   a. LHC (06/27/13):  Mid LAD 40%, mid D1 75%, mid CFX 50-60%, mid RCA 50%, EF 20%, LVEDP 24 mmHg.   CHF (congestive heart failure) (HCC)    Chronic combined systolic and diastolic heart failure (HCC) 06/26/2013   a.  Echo (06/25/13):  EF 20-25%, diff HK, restrictive physio,  mild MR, mod to severe LAE, mild RVE, mildly reduced RVSF, mod to severe RAE   Chronic kidney disease    Dyslipidemia 07/15/2013   Dysrhythmia    a-fib   GERD (gastroesophageal reflux disease)    Hypertension    Incidental pulmonary nodule 06/22/2014   Seen on CT angiogram of chest   NICM (nonischemic cardiomyopathy) with EF 20-25% 06/26/2013   Obesity    Penetrating atherosclerotic ulcer of aorta (HCC) 10/22/2013   Small penetrating ulcer of descending thoracic aorta discovered on routine CTA   Prostate cancer (HCC)    watchful waiting, no treatment so far   S/P Maze operation for atrial fibrillation 10/29/2013   Complete bilateral atrial lesion set using cryothermy  and bipolar radiofrequency ablation with clipping of LA appendage via right mini thoracotomy   Shortness of breath    seen by Jens Som, & low energy     Past Surgical History:  Procedure Laterality Date   CARDIAC CATHETERIZATION  06/2013   CARDIOVERSION N/A 06/30/2013   Procedure: CARDIOVERSION;  Surgeon: Quintella Reichert, MD;  Location: [facility] ENDOSCOPY;  Service: Cardiovascular;  Laterality: N/A;   CARDIOVERSION N/A 07/31/2013   Procedure: CARDIOVERSION;  Surgeon: Vesta Mixer, MD;  Location: [facility] ENDOSCOPY;  Service: Cardiovascular;  Laterality: N/A;   CARDIOVERSION N/A 03/19/2018   Procedure: CARDIOVERSION;  Surgeon: Quintella Reichert, MD;  Location: [facility] ENDOSCOPY;  Service: Cardiovascular;  Laterality: N/A;   CARDIOVERSION N/A 07/24/2018   Procedure: CARDIOVERSION;  Surgeon: Quintella Reichert, MD;  Location: [facility] ENDOSCOPY;  Service: Cardiovascular;  Laterality: N/A;   CLIPPING OF ATRIAL APPENDAGE  10/29/2013   Procedure: CLIPPING OF ATRIAL APPENDAGE;  Surgeon: Purcell Nails, MD;  Location: [facility] OR;  Service: Open Heart Surgery;;   INTRAOPERATIVE TRANSESOPHAGEAL ECHOCARDIOGRAM N/A 10/29/2013   Procedure: INTRAOPERATIVE TRANSESOPHAGEAL ECHOCARDIOGRAM;  Surgeon: Purcell Nails, MD;  Location: [facility] OR;  Service: Open Heart Surgery;  Laterality: N/A;   LEFT AND RIGHT HEART CATHETERIZATION WITH CORONARY ANGIOGRAM N/A 06/27/2013   Procedure: LEFT AND RIGHT HEART CATHETERIZATION WITH CORONARY ANGIOGRAM;  Surgeon: Corky Crafts, MD;  Location: [facility] CATH LAB;  Service: Cardiovascular;  Laterality: N/A;   MEDIAL PARTIAL KNEE REPLACEMENT Left 1990's?   MINIMALLY INVASIVE MAZE PROCEDURE N/A 10/29/2013   Procedure: MINIMALLY INVASIVE MAZE PROCEDURE;  Surgeon: Purcell Nails, MD;  Location: [facility] OR;  Service: Open Heart Surgery;  Laterality: N/A;   TEE WITHOUT CARDIOVERSION N/A 06/30/2013   Procedure: TRANSESOPHAGEAL ECHOCARDIOGRAM (TEE) ;  Surgeon: Quintella Reichert, MD;  Location: [facility] ENDOSCOPY;  Service: Cardiovascular;   Laterality: N/A;   TRANSURETHRAL RESECTION OF PROSTATE  2010    Social History   Socioeconomic History   Marital status: Widowed    Spouse name: Not on file   Number of children: Not on file   Years of education: Not on file   Highest education level: Not on file  Occupational History   Not on file  Tobacco Use   Smoking status: Never   Smokeless tobacco: Never   Tobacco comments:    06/25/2013 "smoked very light; years and years ago"  Vaping Use   Vaping status: Never Used  Substance and Sexual Activity   Alcohol use: Yes    Alcohol/week: 14.0 standard drinks of alcohol    Types: 7 Glasses of wine, 7 Shots of liquor per week   Drug use: No   Sexual activity: Not Currently  Other Topics Concern   Not on file  Social History Narrative  He lives in [place]. He is widowed. Wife passed of breast cancer last year. No children.    Social Determinants of Health   Financial Resource Strain: Not on file  Food Insecurity: Not on file  Transportation Needs: Not on file  Physical Activity: Not on file  Stress: Not on file  Social Connections: Not on file  Intimate Partner Violence: Not on file    Family History  Problem Relation Age of Onset   Healthy Mother    Healthy Father    Healthy Sister    Lung cancer Neg Hx    Lung disease Neg Hx     ROS: Knee arthralgias but no fevers or chills, productive cough, hemoptysis, dysphasia, odynophagia, melena, hematochezia, dysuria, hematuria, rash, seizure activity, orthopnea, PND, pedal edema, claudication. Remaining systems are negative.  Physical Exam: Well-developed well-nourished in no acute distress.  Skin is warm and dry.  HEENT is normal.  Neck is supple.  Chest is clear to auscultation with normal expansion.  Cardiovascular exam is regular rate and rhythm.  Abdominal exam nontender or distended. No masses palpated. Extremities show no edema. neuro grossly intact  EKG Interpretation Date/Time:  Wednesday October 04 2022 08:40:29 EDT Ventricular Rate:  67 PR Interval:  192 QRS Duration:  94 QT Interval:  416 QTC Calculation: 439 R Axis:   129  Text Interpretation: Normal sinus rhythm Right axis deviation Low voltage QRS Cannot rule out Anterior infarct , age undetermined When compared with ECG of 24-Jul-2018 10:11, PR interval has decreased Questionable change in QRS axis Confirmed by Olga Millers ([id number]) on 10/04/2022 8:55:06 AM    A/P  1 paroxysmal atrial fibrillation-patient remains in sinus rhythm.  Continue amiodarone and apixaban.    2 chronic diastolic congestive heart failure-will continue torsemide and Jardiance at present dose.   3 nonischemic cardiomyopathy-patient does have a history of reduced LV function but improved on most recent echocardiogram.  Will continue present medications including beta-blocker and ACE inhibitor.  4 hypertension-blood pressure is controlled today.  His amlodipine was decreased recently due to hypotension.  This has now improved.  If his blood pressure continues to run low can discontinue amlodipine altogether.  5 hyperlipidemia-continue statin.    6 coronary artery disease-continue statin.  He is not on aspirin given need for apixaban.  7 penetrating atherosclerotic ulcer-continue statin.  Seen previously by Dr. Cornelius Moras and medical therapy recommended without follow-up imaging.  8 chronic stage IIIb kidney disease  Will have most recent CBC, renal function, liver functions, TSH, lipids forwarded to [place] from the [place].  Olga Millers, MD

## 2022-09-26 ENCOUNTER — Telehealth: Payer: Self-pay | Admitting: *Deleted

## 2022-09-26 DIAGNOSIS — I10 Essential (primary) hypertension: Secondary | ICD-10-CM

## 2022-09-26 NOTE — Telephone Encounter (Signed)
Spoke with patient, he reports for the last month he has been getting low blood pressure readings in the mornings. He does report he has lost a little weight and he does get a little dizzy when he stands from sitting. He went to the doctor at the [facility] this morning and his bp was 90/70.  Medications confirmed. He takes amlodipine,eliquis, jardaince, and metoprolol in the am and the rest of his medications in the pm. He is wondering if he can cut one of the tablets in 1/2 to see if that will help his bp. Aware dr Jens Som is not in the office today but will forward for him to review.

## 2022-09-27 MED ORDER — AMLODIPINE BESYLATE 5 MG PO TABS: 5.0000 mg | ORAL_TABLET | Freq: Every day | ORAL | Status: AC

## 2022-09-27 NOTE — Telephone Encounter (Signed)
Spoke with pt, Aware of dr Jacalyn Lefevre recommendations. He will let us know when he needs a refill.

## 2022-10-04 ENCOUNTER — Ambulatory Visit: Payer: Medicare Other | Attending: Cardiology | Admitting: Cardiology

## 2022-10-04 ENCOUNTER — Encounter: Payer: Self-pay | Admitting: Cardiology

## 2022-10-04 VITALS — BP 110/58 | HR 67 | Ht 74.0 in | Wt 256.8 lb

## 2022-10-04 DIAGNOSIS — I5032 Chronic diastolic (congestive) heart failure: Secondary | ICD-10-CM | POA: Diagnosis not present

## 2022-10-04 DIAGNOSIS — I2510 Atherosclerotic heart disease of native coronary artery without angina pectoris: Secondary | ICD-10-CM

## 2022-10-04 DIAGNOSIS — I480 Paroxysmal atrial fibrillation: Secondary | ICD-10-CM | POA: Diagnosis not present

## 2022-10-04 DIAGNOSIS — I10 Essential (primary) hypertension: Secondary | ICD-10-CM

## 2022-10-04 DIAGNOSIS — E7800 Pure hypercholesterolemia, unspecified: Secondary | ICD-10-CM | POA: Diagnosis not present

## 2022-10-04 DIAGNOSIS — I719 Aortic aneurysm of unspecified site, without rupture: Secondary | ICD-10-CM

## 2022-10-04 NOTE — Patient Instructions (Signed)

## 2022-10-13 NOTE — Progress Notes (Unsigned)
    Jose Snyder D.[name] [address] [place] [address] Phone: [phone]   Assessment and Plan:     There are no diagnoses linked to this encounter.  ***   Pertinent previous records reviewed include ***   Follow Up: ***     Subjective:   I, Jose Snyder, am serving as a Neurosurgeon for Doctor [name]   Chief Complaint: right knee pain    HPI:    09/19/2022 Patient is a 82 year old male complaining of right knee pain. Patient states he has been seeing schmidtz for shots doesn't remember the name. He would like a shot today it has been 4 months .   10/17/2022 Patient states    Relevant Historical Information: Atrial fibrillation with chronic anticoagulation on Eliquis, hypertension, CHF, CKD  Additional pertinent review of systems negative.   Current Outpatient Medications:    amiodarone (PACERONE) 200 MG tablet, TAKE ONE (1) TABLET BY MOUTH EVERY DAY, Disp: 90 tablet, Rfl: 3   amLODipine (NORVASC) 5 MG tablet, Take 1 tablet (5 mg total) by mouth daily at 6 PM., Disp: , Rfl:    apixaban (ELIQUIS) 2.5 MG TABS tablet, Take 1 tablet (2.5 mg total) by mouth 2 (two) times daily., Disp: 180 tablet, Rfl: 3   atorvastatin (LIPITOR) 80 MG tablet, Take 1 tablet (80 mg total) by mouth daily with breakfast., Disp: 30 tablet, Rfl: 12   calcitRIOL (ROCALTROL) 0.25 MCG capsule, TAKE ONE CAPSULE BY MOUTH DAILY (STOP TAKING CHOLECALCIFEROL), Disp: , Rfl:    docusate sodium (COLACE) 100 MG capsule, Take 400 mg by mouth at bedtime as needed for mild constipation. , Disp: , Rfl:    empagliflozin (JARDIANCE) 25 MG TABS tablet, TAKE ONE-HALF TABLET BY MOUTH EVERY MORNING FOR HEART FAIURE, Disp: , Rfl:    ferrous sulfate 325 (65 FE) MG tablet, Take 325 mg by mouth as directed. TAKE 1 TAB M/W/F, Disp: , Rfl:    levalbuterol (XOPENEX HFA) 45 MCG/ACT inhaler, Inhale 2 puffs into the lungs every 6 (six) hours as needed for wheezing., Disp: 1 each,  Rfl: 12   lisinopril (PRINIVIL,ZESTRIL) 40 MG tablet, Take 40 mg by mouth daily., Disp: , Rfl:    Melatonin 3 MG CAPS, Take 3 mg by mouth at bedtime., Disp: , Rfl:    metoprolol succinate (TOPROL-XL) 200 MG 24 hr tablet, Take 100 mg by mouth daily. Take with or immediately following a meal., Disp: 90 tablet, Rfl: 3   torsemide (DEMADEX) 20 MG tablet, Take 2 tablets (40 mg total) by mouth daily., Disp: 180 tablet, Rfl: 3  Current Facility-Administered Medications:    sodium chloride flush (NS) 0.9 % injection 3 mL, 3 mL, Intravenous, Q12H, Crenshaw, Madolyn Frieze, MD   Objective:     There were no vitals filed for this visit.    There is no height or weight on file to calculate BMI.    Physical Exam:    ***   Electronically signed by:  Jose Snyder [name] 7:08 AM 10/13/22

## 2022-10-17 ENCOUNTER — Ambulatory Visit (INDEPENDENT_AMBULATORY_CARE_PROVIDER_SITE_OTHER): Payer: Medicare Other | Admitting: Sports Medicine

## 2022-10-17 VITALS — BP 130/60 | HR 68 | Ht 74.0 in | Wt 254.0 lb

## 2022-10-17 DIAGNOSIS — M25561 Pain in right knee: Secondary | ICD-10-CM | POA: Diagnosis not present

## 2022-10-17 DIAGNOSIS — M1711 Unilateral primary osteoarthritis, right knee: Secondary | ICD-10-CM | POA: Diagnosis not present

## 2022-10-17 DIAGNOSIS — G8929 Other chronic pain: Secondary | ICD-10-CM

## 2022-10-17 MED ORDER — TRIAMCINOLONE ACETONIDE 32 MG IX SRER
32.0000 mg | Freq: Once | INTRA_ARTICULAR | Status: AC
  Administered 2022-10-17: 32 mg via INTRA_ARTICULAR

## 2022-10-17 NOTE — Patient Instructions (Signed)
Good to see you  Zilretta injection today We hope for 3 month relief you want a repeat after the months call and ask for a repeat zilretta injection

## 2022-11-29 ENCOUNTER — Encounter: Payer: Self-pay | Admitting: Podiatry

## 2022-11-29 ENCOUNTER — Ambulatory Visit (INDEPENDENT_AMBULATORY_CARE_PROVIDER_SITE_OTHER): Payer: Medicare Other | Admitting: Podiatry

## 2022-11-29 DIAGNOSIS — M79676 Pain in unspecified toe(s): Secondary | ICD-10-CM | POA: Diagnosis not present

## 2022-11-29 DIAGNOSIS — D689 Coagulation defect, unspecified: Secondary | ICD-10-CM | POA: Diagnosis not present

## 2022-11-29 DIAGNOSIS — B351 Tinea unguium: Secondary | ICD-10-CM

## 2022-11-29 DIAGNOSIS — N183 Chronic kidney disease, stage 3 unspecified: Secondary | ICD-10-CM

## 2022-11-29 NOTE — Progress Notes (Signed)
This patient returns to my office for at risk foot care.  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  This patient is unable to cut nails himself since the patient cannot reach his nails.These nails are painful walking and wearing shoes.  This patient presents for at risk foot care today.  General Appearance  Alert, conversant and in no acute stress.  Vascular  Dorsalis pedis and posterior tibial  pulses are palpable  bilaterally.  Capillary return is within normal limits  bilaterally. Temperature is within normal limits  bilaterally.  Neurologic  Senn-Weinstein monofilament wire test within normal limits  bilaterally. Muscle power within normal limits bilaterally.  Nails Thick disfigured discolored nails with subungual debris  from hallux to fifth toes bilaterally. No evidence of bacterial infection or drainage bilaterally.  Great toenail left hallux is unattached.  Orthopedic  No limitations of motion  feet .  No crepitus or effusions noted.  No bony pathology or digital deformities noted.  Skin  normotropic skin with no porokeratosis noted bilaterally.  No signs of infections or ulcers noted.     Onychomycosis  Pain in right toes  Pain in left toes  Consent was obtained for treatment procedures.   Mechanical debridement of nails 1-5  bilaterally performed with a nail nipper.  Filed with dremel without incident. Hallux toenail removed and bandaged with DSD. Told hin to peroxide his nails  B/L.   Return office visit   10 weeks                  Told patient to return for periodic foot care and evaluation due to potential at risk complications.   Helane Gunther DPM

## 2023-01-03 ENCOUNTER — Other Ambulatory Visit: Payer: Self-pay | Admitting: Cardiology

## 2023-01-03 DIAGNOSIS — I480 Paroxysmal atrial fibrillation: Secondary | ICD-10-CM

## 2023-02-07 ENCOUNTER — Ambulatory Visit: Payer: Medicare Other | Admitting: Podiatry

## 2023-02-09 ENCOUNTER — Encounter: Payer: Self-pay | Admitting: Podiatry

## 2023-02-09 ENCOUNTER — Ambulatory Visit (INDEPENDENT_AMBULATORY_CARE_PROVIDER_SITE_OTHER): Payer: Medicare Other | Admitting: Podiatry

## 2023-02-09 DIAGNOSIS — N183 Chronic kidney disease, stage 3 unspecified: Secondary | ICD-10-CM

## 2023-02-09 DIAGNOSIS — D509 Iron deficiency anemia, unspecified: Secondary | ICD-10-CM | POA: Insufficient documentation

## 2023-02-09 DIAGNOSIS — R251 Tremor, unspecified: Secondary | ICD-10-CM | POA: Insufficient documentation

## 2023-02-09 DIAGNOSIS — D485 Neoplasm of uncertain behavior of skin: Secondary | ICD-10-CM | POA: Insufficient documentation

## 2023-02-09 DIAGNOSIS — Z4802 Encounter for removal of sutures: Secondary | ICD-10-CM | POA: Insufficient documentation

## 2023-02-09 DIAGNOSIS — M79676 Pain in unspecified toe(s): Secondary | ICD-10-CM

## 2023-02-09 DIAGNOSIS — L0292 Furuncle, unspecified: Secondary | ICD-10-CM | POA: Insufficient documentation

## 2023-02-09 DIAGNOSIS — D2362 Other benign neoplasm of skin of left upper limb, including shoulder: Secondary | ICD-10-CM | POA: Insufficient documentation

## 2023-02-09 DIAGNOSIS — G250 Essential tremor: Secondary | ICD-10-CM | POA: Insufficient documentation

## 2023-02-09 DIAGNOSIS — B351 Tinea unguium: Secondary | ICD-10-CM

## 2023-02-09 DIAGNOSIS — L570 Actinic keratosis: Secondary | ICD-10-CM | POA: Insufficient documentation

## 2023-02-09 NOTE — Progress Notes (Signed)
This patient returns to my office for at risk foot care.  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  This patient is unable to cut nails himself since the patient cannot reach his nails.These nails are painful walking and wearing shoes.  This patient presents for at risk foot care today.  General Appearance  Alert, conversant and in no acute stress.  Vascular  Dorsalis pedis and posterior tibial  pulses are palpable  bilaterally.  Capillary return is within normal limits  bilaterally. Temperature is within normal limits  bilaterally.  Neurologic  Senn-Weinstein monofilament wire test within normal limits  bilaterally. Muscle power within normal limits bilaterally.  Nails Thick disfigured discolored nails with subungual debris  from hallux to fifth toes bilaterally. No evidence of bacterial infection or drainage bilaterally.  Great toenail left hallux is unattached.  Orthopedic  No limitations of motion  feet .  No crepitus or effusions noted.  No bony pathology or digital deformities noted.  Skin  normotropic skin with no porokeratosis noted bilaterally.  No signs of infections or ulcers noted.     Onychomycosis  Pain in right toes  Pain in left toes  Consent was obtained for treatment procedures.   Mechanical debridement of nails 1-5  bilaterally performed with a nail nipper.  Filed with dremel without incident. Hallux toenail removed and bandaged with DSD. Told hin to peroxide his nails  B/L.   Return office visit   10 weeks                  Told patient to return for periodic foot care and evaluation due to potential at risk complications.   Helane Gunther DPM

## 2023-03-02 NOTE — Progress Notes (Deleted)
    Ben Jackson D.[facility] [address] [place] [address] Phone: [phone]   Assessment and Plan:     There are no diagnoses linked to this encounter.  ***   Pertinent previous records reviewed include ***    Follow Up: ***     Subjective:   I, Briella Hobday, am serving as a neurosurgeon for Doctor Morene Mace  Chief Complaint: left knee pain   HPI:   03/05/2023 Patient is a 83 year old male with left knee pain. Patient states  Relevant Historical Information: ***  Additional pertinent review of systems negative.   Current Outpatient Medications:    amiodarone  (PACERONE ) 200 MG tablet, TAKE ONE (1) TABLET BY MOUTH EVERY DAY, Disp: 90 tablet, Rfl: 3   amLODipine  (NORVASC ) 5 MG tablet, Take 1 tablet (5 mg total) by mouth daily at 6 PM., Disp: , Rfl:    apixaban  (ELIQUIS ) 2.5 MG TABS tablet, Take 1 tablet (2.5 mg total) by mouth 2 (two) times daily., Disp: 180 tablet, Rfl: 3   atorvastatin  (LIPITOR) 80 MG tablet, Take 1 tablet (80 mg total) by mouth daily with breakfast., Disp: 30 tablet, Rfl: 12   calcitRIOL (ROCALTROL) 0.25 MCG capsule, TAKE ONE CAPSULE BY MOUTH DAILY (STOP TAKING CHOLECALCIFEROL), Disp: , Rfl:    clonazePAM (KLONOPIN) 0.5 MG tablet, Take 0.5 tablets by mouth 2 (two) times daily., Disp: , Rfl:    docusate sodium  (COLACE) 100 MG capsule, Take 400 mg by mouth at bedtime as needed for mild constipation. , Disp: , Rfl:    empagliflozin (JARDIANCE) 25 MG TABS tablet, TAKE ONE-HALF TABLET BY MOUTH EVERY MORNING FOR HEART FAIURE, Disp: , Rfl:    ferrous sulfate 325 (65 FE) MG tablet, Take 325 mg by mouth as directed. TAKE 1 TAB M/W/F, Disp: , Rfl:    levalbuterol  (XOPENEX  HFA) 45 MCG/ACT inhaler, Inhale 2 puffs into the lungs every 6 (six) hours as needed for wheezing., Disp: 1 each, Rfl: 12   lisinopril  (PRINIVIL ,ZESTRIL ) 40 MG tablet, Take 40 mg by mouth daily., Disp: , Rfl:    Melatonin 3 MG CAPS, Take 3 mg by mouth  at bedtime., Disp: , Rfl:    metoprolol  succinate (TOPROL -XL) 200 MG 24 hr tablet, Take 100 mg by mouth daily. Take with or immediately following a meal., Disp: 90 tablet, Rfl: 3   torsemide  (DEMADEX ) 20 MG tablet, Take 2 tablets (40 mg total) by mouth daily., Disp: 180 tablet, Rfl: 3  Current Facility-Administered Medications:    sodium chloride  flush (NS) 0.9 % injection 3 mL, 3 mL, Intravenous, Q12H, Crenshaw, Redell RAMAN, MD   Objective:     There were no vitals filed for this visit.    There is no height or weight on file to calculate BMI.    Physical Exam:    ***   Electronically signed by:  Odis Mace D[facility] 3:43 PM 03/02/23

## 2023-03-05 ENCOUNTER — Ambulatory Visit: Payer: No Typology Code available for payment source | Admitting: Sports Medicine

## 2023-03-07 NOTE — Progress Notes (Signed)
 Ben Aydeen Blume D.[facility] [address] [place] [address] Phone: [phone]   Assessment and Plan:     1. Chronic pain of left knee 2. Status post left partial knee replacement 3. Other secondary osteoarthritis of left knee  -Chronic with exacerbation, initial sports medicine visit - Several months of left knee pain likely related to arthritic changes secondary to partial medial knee replacement occurring 15+ years ago - We discussed the higher risks of intra-articular injections with partial knee replacement (literature showing around 2%).  Patient understand risks and wished to proceed with intra-articular CSI.  Tolerated well per note below.  Elevated risk of bleeding with patient on chronic anticoagulation with Eliquis  - May use Tylenol  for day-to-day pain relief - X-ray obtained in clinic.  My interpretation: No acute fracture or dislocation.  Status post medial knee replacement with hardware visualized along medial distal femur.  Degenerative changes along medial tibial plateau.  Lateral compartment and patellofemoral compartment degenerative changes  Procedure: Knee Joint Injection Side: Left Indication: Flare of osteoarthritis  Risks explained and consent was given verbally. The site was cleaned with alcohol prep. A needle was introduced with an anterio-lateral approach. Injection given using 2mL of 1% lidocaine  without epinephrine  and 1mL of kenalog  40mg /ml. This was well tolerated and resulted in symptomatic relief.  Needle was removed, hemostasis achieved, and post injection instructions were explained.   Pt was advised to call or return to clinic if these symptoms worsen or fail to improve as anticipated.   Pertinent previous records reviewed include none  Follow Up: As needed if no improvement or worsening of symptoms.  Could consider repeat Zilretta  for right knee versus Zilretta  versus genicular arterial embolization for left  knee   Subjective:   I, Moenique Parris, am serving as a neurosurgeon for Doctor Morene Mace  Chief Complaint: left knee pain   HPI:   03/08/2023 Patient is a 83 year old male with left knee pain. Patient states left knee hx of partial replacement in the 80s. He has anterior knee pain that he is sure is bone on bone. He thinks he would like a CSI. A couple of months ago he had a pop that was sore and has since recovered. Acetaminophen  for the pain   Relevant Historical Information: Atrial fibrillation with chronic anticoagulation on Eliquis , hypertension, CHF, CKD  Additional pertinent review of systems negative.   Current Outpatient Medications:    amiodarone  (PACERONE ) 200 MG tablet, TAKE ONE (1) TABLET BY MOUTH EVERY DAY, Disp: 90 tablet, Rfl: 3   apixaban  (ELIQUIS ) 2.5 MG TABS tablet, Take 1 tablet (2.5 mg total) by mouth 2 (two) times daily., Disp: 180 tablet, Rfl: 3   atorvastatin  (LIPITOR) 80 MG tablet, Take 1 tablet (80 mg total) by mouth daily with breakfast., Disp: 30 tablet, Rfl: 12   calcitRIOL (ROCALTROL) 0.25 MCG capsule, TAKE ONE CAPSULE BY MOUTH DAILY (STOP TAKING CHOLECALCIFEROL), Disp: , Rfl:    clonazePAM (KLONOPIN) 0.5 MG tablet, Take 0.5 tablets by mouth 2 (two) times daily., Disp: , Rfl:    docusate sodium  (COLACE) 100 MG capsule, Take 400 mg by mouth at bedtime as needed for mild constipation. , Disp: , Rfl:    empagliflozin (JARDIANCE) 25 MG TABS tablet, TAKE ONE-HALF TABLET BY MOUTH EVERY MORNING FOR HEART FAIURE, Disp: , Rfl:    ferrous sulfate 325 (65 FE) MG tablet, Take 325 mg by mouth as directed. TAKE 1 TAB M/W/F, Disp: , Rfl:  levalbuterol  (XOPENEX  HFA) 45 MCG/ACT inhaler, Inhale 2 puffs into the lungs every 6 (six) hours as needed for wheezing., Disp: 1 each, Rfl: 12   lisinopril  (PRINIVIL ,ZESTRIL ) 40 MG tablet, Take 40 mg by mouth daily., Disp: , Rfl:    Melatonin 3 MG CAPS, Take 3 mg by mouth at bedtime., Disp: , Rfl:    metoprolol  succinate (TOPROL -XL)  200 MG 24 hr tablet, Take 100 mg by mouth daily. Take with or immediately following a meal., Disp: 90 tablet, Rfl: 3   torsemide  (DEMADEX ) 20 MG tablet, Take 2 tablets (40 mg total) by mouth daily., Disp: 180 tablet, Rfl: 3   amLODipine  (NORVASC ) 5 MG tablet, Take 1 tablet (5 mg total) by mouth daily at 6 PM., Disp: , Rfl:   Current Facility-Administered Medications:    sodium chloride  flush (NS) 0.9 % injection 3 mL, 3 mL, Intravenous, Q12H, Crenshaw, Redell RAMAN, MD   Objective:     Vitals:   03/08/23 0932  BP: 132/80  Pulse: 80  SpO2: 98%  Weight: 254 lb (115.2 kg)  Height: 6' 2 (1.88 m)      Body mass index is 32.61 kg/m.    Physical Exam:    General:  awake, alert oriented, no acute distress nontoxic Skin: no suspicious lesions or rashes Neuro:sensation intact and strength 5/5 with no deficits, no atrophy, normal muscle tone Psych: No signs of anxiety, depression or other mood disorder  Left knee: Moderate swelling No deformity Positive fluid wave, joint milking ROM Flex 100, Ext 5 TTP medial femoral condyle, medial patella, medial joint line NTTP over the quad tendon,   lat fem condyle,  patella tendon, tibial tuberostiy, fibular head, posterior fossa, pes anserine bursa, gerdy's tubercle,   lateral jt line Neg anterior and posterior drawer Neg lachman Neg sag sign Negative varus stress Negative valgus stress Negative McMurray    Gait antalgic, slow steps   Electronically signed by:  Odis Mace [facility] 10:07 AM 03/08/23

## 2023-03-08 ENCOUNTER — Ambulatory Visit (INDEPENDENT_AMBULATORY_CARE_PROVIDER_SITE_OTHER): Payer: Medicare Other

## 2023-03-08 ENCOUNTER — Ambulatory Visit (INDEPENDENT_AMBULATORY_CARE_PROVIDER_SITE_OTHER): Payer: Medicare Other | Admitting: Sports Medicine

## 2023-03-08 VITALS — BP 132/80 | HR 80 | Ht 74.0 in | Wt 254.0 lb

## 2023-03-08 DIAGNOSIS — Z96652 Presence of left artificial knee joint: Secondary | ICD-10-CM | POA: Diagnosis not present

## 2023-03-08 DIAGNOSIS — G8929 Other chronic pain: Secondary | ICD-10-CM

## 2023-03-08 DIAGNOSIS — M25562 Pain in left knee: Secondary | ICD-10-CM

## 2023-03-08 DIAGNOSIS — M175 Other unilateral secondary osteoarthritis of knee: Secondary | ICD-10-CM | POA: Diagnosis not present

## 2023-03-19 ENCOUNTER — Encounter: Payer: Self-pay | Admitting: Sports Medicine

## 2023-04-02 ENCOUNTER — Ambulatory Visit (HOSPITAL_BASED_OUTPATIENT_CLINIC_OR_DEPARTMENT_OTHER): Payer: No Typology Code available for payment source | Admitting: Family Medicine

## 2023-04-30 ENCOUNTER — Telehealth: Payer: Self-pay | Admitting: Sports Medicine

## 2023-04-30 NOTE — Telephone Encounter (Signed)
 noted

## 2023-04-30 NOTE — Telephone Encounter (Signed)
 Patient called requesting an appointment for knees.  He said that he would like an injection in both. Please advise on possible Zilretta?

## 2023-05-04 ENCOUNTER — Telehealth: Payer: Self-pay

## 2023-05-04 NOTE — Telephone Encounter (Signed)
**   Patient scheduled for 3/11/25[name] authorized for Bilateral knee Patient responsibility for J3304/Zilretta and CPT code 21308 will be 20% The remaining covered at 80% by the payer at the contracted rate Deductible $257 has met $219.14 Deductible must be met before coverage applies OOP does not apply to these services

## 2023-05-07 NOTE — Telephone Encounter (Signed)
 Noted.

## 2023-05-07 NOTE — Progress Notes (Unsigned)
    Aleen Sells D.[name] [address] [place] [address] Phone: [phone]   Assessment and Plan:     There are no diagnoses linked to this encounter.  ***   Pertinent previous records reviewed include ***    Follow Up: ***     Subjective:   I, Dominic Mahaney, am serving as a Neurosurgeon for Doctor [name]   Chief Complaint: left knee pain    HPI:    03/08/2023 Patient is a 83 year old male with left knee pain. Patient states left knee hx of partial replacement in the 80s. He has anterior knee pain that he is sure is bone on bone. He thinks he would like a CSI. A couple of months ago he had a pop that was sore and has since recovered. Acetaminophen for the pain   05/08/2023 Patient states   Relevant Historical Information: Atrial fibrillation with chronic anticoagulation on Eliquis, hypertension, CHF, CKD  Additional pertinent review of systems negative.   Current Outpatient Medications:    amiodarone (PACERONE) 200 MG tablet, TAKE ONE (1) TABLET BY MOUTH EVERY DAY, Disp: 90 tablet, Rfl: 3   amLODipine (NORVASC) 5 MG tablet, Take 1 tablet (5 mg total) by mouth daily at 6 PM., Disp: , Rfl:    apixaban (ELIQUIS) 2.5 MG TABS tablet, Take 1 tablet (2.5 mg total) by mouth 2 (two) times daily., Disp: 180 tablet, Rfl: 3   atorvastatin (LIPITOR) 80 MG tablet, Take 1 tablet (80 mg total) by mouth daily with breakfast., Disp: 30 tablet, Rfl: 12   calcitRIOL (ROCALTROL) 0.25 MCG capsule, TAKE ONE CAPSULE BY MOUTH DAILY (STOP TAKING CHOLECALCIFEROL), Disp: , Rfl:    clonazePAM (KLONOPIN) 0.5 MG tablet, Take 0.5 tablets by mouth 2 (two) times daily., Disp: , Rfl:    docusate sodium (COLACE) 100 MG capsule, Take 400 mg by mouth at bedtime as needed for mild constipation. , Disp: , Rfl:    empagliflozin (JARDIANCE) 25 MG TABS tablet, TAKE ONE-HALF TABLET BY MOUTH EVERY MORNING FOR HEART FAIURE, Disp: , Rfl:    ferrous sulfate 325 (65 FE) MG  tablet, Take 325 mg by mouth as directed. TAKE 1 TAB M/W/F, Disp: , Rfl:    levalbuterol (XOPENEX HFA) 45 MCG/ACT inhaler, Inhale 2 puffs into the lungs every 6 (six) hours as needed for wheezing., Disp: 1 each, Rfl: 12   lisinopril (PRINIVIL,ZESTRIL) 40 MG tablet, Take 40 mg by mouth daily., Disp: , Rfl:    Melatonin 3 MG CAPS, Take 3 mg by mouth at bedtime., Disp: , Rfl:    metoprolol succinate (TOPROL-XL) 200 MG 24 hr tablet, Take 100 mg by mouth daily. Take with or immediately following a meal., Disp: 90 tablet, Rfl: 3   torsemide (DEMADEX) 20 MG tablet, Take 2 tablets (40 mg total) by mouth daily., Disp: 180 tablet, Rfl: 3  Current Facility-Administered Medications:    sodium chloride flush (NS) 0.9 % injection 3 mL, 3 mL, Intravenous, Q12H, Crenshaw, Madolyn Frieze, MD   Objective:     There were no vitals filed for this visit.    There is no height or weight on file to calculate BMI.    Physical Exam:    ***   Electronically signed by:  Aleen Sells [name] 3:44 PM 05/07/23

## 2023-05-08 ENCOUNTER — Ambulatory Visit: Admitting: Sports Medicine

## 2023-05-08 VITALS — BP 150/80 | HR 95 | Ht 74.0 in | Wt 255.0 lb

## 2023-05-08 DIAGNOSIS — Z96652 Presence of left artificial knee joint: Secondary | ICD-10-CM

## 2023-05-08 DIAGNOSIS — G8929 Other chronic pain: Secondary | ICD-10-CM | POA: Diagnosis not present

## 2023-05-08 DIAGNOSIS — M1711 Unilateral primary osteoarthritis, right knee: Secondary | ICD-10-CM

## 2023-05-08 DIAGNOSIS — M175 Other unilateral secondary osteoarthritis of knee: Secondary | ICD-10-CM | POA: Diagnosis not present

## 2023-05-08 DIAGNOSIS — M25561 Pain in right knee: Secondary | ICD-10-CM

## 2023-05-08 DIAGNOSIS — M25562 Pain in left knee: Secondary | ICD-10-CM

## 2023-05-08 MED ORDER — TRIAMCINOLONE ACETONIDE 32 MG IX SRER
32.0000 mg | Freq: Once | INTRA_ARTICULAR | Status: AC
  Administered 2023-05-08: 32 mg via INTRA_ARTICULAR

## 2023-05-08 NOTE — Patient Instructions (Addendum)
 Zilretta injections today bilateral knees. Tylenol for day to day pain relief. Follow up in 3 to 4 months.

## 2023-05-11 ENCOUNTER — Encounter: Payer: Self-pay | Admitting: Podiatry

## 2023-05-11 ENCOUNTER — Ambulatory Visit (INDEPENDENT_AMBULATORY_CARE_PROVIDER_SITE_OTHER): Payer: Medicare Other | Admitting: Podiatry

## 2023-05-11 DIAGNOSIS — M79676 Pain in unspecified toe(s): Secondary | ICD-10-CM | POA: Diagnosis not present

## 2023-05-11 DIAGNOSIS — D689 Coagulation defect, unspecified: Secondary | ICD-10-CM

## 2023-05-11 DIAGNOSIS — N183 Chronic kidney disease, stage 3 unspecified: Secondary | ICD-10-CM | POA: Diagnosis not present

## 2023-05-11 DIAGNOSIS — B351 Tinea unguium: Secondary | ICD-10-CM | POA: Diagnosis not present

## 2023-05-11 NOTE — Progress Notes (Signed)
 This patient returns to my office for at risk foot care.  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  This patient is unable to cut nails himself since the patient cannot reach his nails.These nails are painful walking and wearing shoes.  This patient presents for at risk foot care today.  General Appearance  Alert, conversant and in no acute stress.  Vascular  Dorsalis pedis and posterior tibial  pulses are palpable  bilaterally.  Capillary return is within normal limits  bilaterally. Temperature is within normal limits  bilaterally.  Neurologic  Senn-Weinstein monofilament wire test within normal limits  bilaterally. Muscle power within normal limits bilaterally.  Nails Thick disfigured discolored nails with subungual debris  from hallux to fifth toes bilaterally. No evidence of bacterial infection or drainage bilaterally.  Great toenail left hallux is unattached.  Orthopedic  No limitations of motion  feet .  No crepitus or effusions noted.  No bony pathology or digital deformities noted.  Skin  normotropic skin with no porokeratosis noted bilaterally.  No signs of infections or ulcers noted.     Onychomycosis  Pain in right toes  Pain in left toes  Consent was obtained for treatment procedures.   Mechanical debridement of nails 1-5  bilaterally performed with a nail nipper.  Filed with dremel without incident. Fourth toenail right is unattached.  DSD applied.   Return office visit   10 weeks                  Told patient to return for periodic foot care and evaluation due to potential at risk complications.   Helane Gunther DPM

## 2023-05-29 ENCOUNTER — Ambulatory Visit: Attending: Physician Assistant | Admitting: Physician Assistant

## 2023-05-29 ENCOUNTER — Encounter: Payer: Self-pay | Admitting: Physician Assistant

## 2023-05-29 VITALS — BP 122/70 | HR 66 | Ht 74.0 in | Wt 254.0 lb

## 2023-05-29 DIAGNOSIS — I10 Essential (primary) hypertension: Secondary | ICD-10-CM | POA: Insufficient documentation

## 2023-05-29 DIAGNOSIS — I428 Other cardiomyopathies: Secondary | ICD-10-CM | POA: Insufficient documentation

## 2023-05-29 DIAGNOSIS — I480 Paroxysmal atrial fibrillation: Secondary | ICD-10-CM | POA: Insufficient documentation

## 2023-05-29 DIAGNOSIS — H539 Unspecified visual disturbance: Secondary | ICD-10-CM | POA: Diagnosis not present

## 2023-05-29 NOTE — Patient Instructions (Addendum)
 Medication Instructions:  NO CHANGES *If you need a refill on your cardiac medications before your next appointment, please call your pharmacy*  Lab Work: NO LABS If you have labs (blood work) drawn today and your tests are completely normal, you will receive your results only by: MyChart Message (if you have MyChart) OR A paper copy in the mail If you have any lab test that is abnormal or we need to change your treatment, we will call you to review the results.  Testing/Procedures:[facility] Your physician has requested that you have an echocardiogram. Echocardiography is a painless test that uses sound waves to create images of your heart. It provides your doctor with information about the size and shape of your heart and how well your heart's chambers and valves are working. This procedure takes approximately one hour. There are no restrictions for this procedure. Please do NOT wear cologne, perfume, aftershave, or lotions (deodorant is allowed). Please arrive 15 minutes prior to your appointment time.  Please note: We ask at that you not bring children with you during ultrasound (echo/ vascular) testing. Due to room size and safety concerns, children are not allowed in the ultrasound rooms during exams. Our front office staff cannot provide observation of children in our lobby area while testing is being conducted. An adult accompanying a patient to their appointment will only be allowed in the ultrasound room at the discretion of the ultrasound technician under special circumstances. We apologize for any inconvenience.   [facility] Your physician has requested that you have a carotid duplex. This test is an ultrasound of the carotid arteries in your neck. It looks at blood flow through these arteries that supply the brain with blood. Allow one hour for this exam. There are no restrictions or special instructions.   Follow-Up: At [facility], you and your  health needs are our priority.  As part of our continuing mission to provide you with exceptional heart care, our providers are all part of one team.  This team includes your primary Cardiologist (physician) and Advanced Practice Providers or APPs (Physician Assistants and Nurse Practitioners) who all work together to provide you with the care you need, when you need it.  Your next appointment:   4-6 month(s)  Provider:   Olga Millers, MD  Other Instructions   1st Floor: - Lobby - Registration  - Pharmacy  - Lab - Cafe  2nd Floor: - PV Lab - Diagnostic Testing (echo, CT, nuclear med)  3rd Floor: - Vacant  4th Floor: - TCTS (cardiothoracic surgery) - AFib Clinic - Structural Heart Clinic - Vascular Surgery  - Vascular Ultrasound  5th Floor: - [facility] (general and EP) - Clinical Pharmacy for coumadin, hypertension, lipid, weight-loss medications, and med management appointments    Valet parking services will be available as well.

## 2023-05-29 NOTE — Progress Notes (Unsigned)
 Cardiology Office Note:  .   Date:  05/31/2023  ID:  Jose Snyder, DOB [birth year], MRN [id number] PCP: Greta Doom, MD  [facility] Providers Cardiologist:  Olga Millers, MD     History of Present Illness: .   Jose Snyder is a 83 y.o. male with PMH of atrial fibrillation, CAD, chronic diastolic heart failure, nonischemic cardiomyopathy with improved EF, hypertension, hyperlipidemia, and CKD stage III.  Echocardiogram in April 2015 showed EF of 20 to 25%.  Left heart cath demonstrated moderate nonobstructive CAD, 75% mid D1 lesion.  Dilated cardiomyopathy was felt to be out of proportion to the degree of underlying CAD, therefore he was diagnosed with nonischemic cardiomyopathy.  He had DCCV amiodarone however did not hold sinus rhythm.  He was seen by Dr. Johney Frame and referred to Dr. Cornelius Moras for surgical maze.  Carotid Doppler in August 2015 showed 1 to 39% bilateral stenosis.  He underwent Maze procedure with clipping of the left atrial appendage on 10/29/2013.  CTA in April 2018 showed focal saccular outpouching of the distal descending thoracic aorta with no intramural hematoma or inflammatory changes.  Finding was felt to be consistent with possible early ulcer formation and not a penetrating ulcer.  See image was reviewed by Dr. Cornelius Moras and no further follow-up was recommended.  Echocardiogram in June 2023 showed normal EF 60 to 65%, moderate LAE, LA mildly dilated ascending aorta measuring at 39 mm.  He was last seen by Dr. Jens Som in August 2024 at which time he was holding sinus rhythm.  He was on amiodarone and Eliquis.   Patient presents today for follow-up.  He has been compliant with his Eliquis.  He denies any recent chest pain or shortness of breath.  He has no lower extremity edema.  About 1.5 weeks ago, he was looking out of his window when he saw 2 blue dots at the corner of his right vision.  He turned around and rubbed his eye and that the blue dots went away.  He has not had any  recurrent vision changes since then.  He denies any visual cough.  He has no slurring of speech or weakness.  He presented to [facility] for his vision change says he has a history of cataract.  He mentioned to his ophthalmologist regarding that episode of vision change that only last a few seconds, they told him he might had a mini stroke and told him to seek urgent medical attention.  He did not go to the emergency room.  He says for the past 1.5 weeks, symptom has now recurred.  EKG today was initially interpreted as junctional rhythm, however on closer look, the underlying artifact is likely coming from his essential tremors.  I suspect the patient is currently in sinus rhythm.  I reviewed his case and EKG with DOD Dr. Peter Swaziland.  We recommended echocardiogram and carotid ultrasound.  I did not recommend a heart monitor since he has a history of A-fib and has been compliant with Eliquis.  I did not order an MRI of the brain since the incidence happened 1.5 weeks ago.  He has been instructed to seek urgent medical attention if the symptom recurs again.  Otherwise, he can follow-up with Dr. Jens Som in 4 to 6 months.  ROS:   He denies chest pain, palpitations, dyspnea, pnd, orthopnea, n, v, dizziness, syncope, edema, weight gain, or early satiety. All other systems reviewed and are otherwise negative except as noted above.  Studies Reviewed: Marland Kitchen   EKG Interpretation Date/Time:  Tuesday May 29 2023 13:36:47 EDT Ventricular Rate:  65 PR Interval:    QRS Duration:  92 QT Interval:  420 QTC Calculation: 436 R Axis:   133  Text Interpretation: Likely sinus rhythm, although P wave is not very obvious due to artifact No significant ST-T wave changes Confirmed by Azalee Course [phone]) on 05/31/2023 10:05:43 PM    Cardiac Studies & Procedures   ______________________________________________________________________________________________     ECHOCARDIOGRAM  ECHOCARDIOGRAM COMPLETE  08/02/2021  Narrative ECHOCARDIOGRAM REPORT    Patient Name:   Jose Snyder Date of Exam: 08/02/2021 Medical Rec #:  [id number]  Height:       74.0 in Accession #:    [id number] Weight:       272.0 lb Date of Birth:  1940-04-13  BSA:          2.477 m Patient Age:    81 years   BP:           104/62 mmHg Patient Gender: M          HR:           63 bpm. Exam Location:  [place]  Procedure: 2D Echo, Cardiac Doppler, Color Doppler and Strain Analysis  Indications:    Dyspnea  History:        Patient has prior history of Echocardiogram examinations, most recent 12/10/2017. CAD, Arrythmias:Atrial Fibrillation, Signs/Symptoms:Shortness of Breath; Risk Factors:Dyslipidemia and Hypertension. S/P Maze procedure.  Sonographer:    Roosvelt Maser RDCS Referring Phys: [id number] BRIAN S CRENSHAW  IMPRESSIONS   1. Left ventricular ejection fraction, by estimation, is 60 to 65%. The left ventricle has normal function. The left ventricle has no regional wall motion abnormalities. Left ventricular diastolic parameters were normal. 2. Right ventricular systolic function is normal. The right ventricular size is normal. 3. Left atrial size was moderately dilated. 4. The mitral valve is normal in structure. No evidence of mitral valve regurgitation. No evidence of mitral stenosis. 5. The aortic valve is normal in structure. Aortic valve regurgitation is not visualized. No aortic stenosis is present. 6. Aneurysm of the ascending aorta, measuring 39 mm. 7. The inferior vena cava is normal in size with greater than 50% respiratory variability, suggesting right atrial pressure of 3 mmHg.  FINDINGS Left Ventricle: Left ventricular ejection fraction, by estimation, is 60 to 65%. The left ventricle has normal function. The left ventricle has no regional wall motion abnormalities. The left ventricular internal cavity size was normal in size. There is no left ventricular hypertrophy. Left ventricular diastolic parameters  were normal.  Right Ventricle: The right ventricular size is normal. No increase in right ventricular wall thickness. Right ventricular systolic function is normal.  Left Atrium: Left atrial size was moderately dilated.  Right Atrium: Right atrial size was normal in size.  Pericardium: There is no evidence of pericardial effusion.  Mitral Valve: The mitral valve is normal in structure. No evidence of mitral valve regurgitation. No evidence of mitral valve stenosis.  Tricuspid Valve: The tricuspid valve is normal in structure. Tricuspid valve regurgitation is not demonstrated. No evidence of tricuspid stenosis.  Aortic Valve: The aortic valve is normal in structure. Aortic valve regurgitation is not visualized. No aortic stenosis is present. Aortic valve mean gradient measures 5.0 mmHg. Aortic valve peak gradient measures 8.8 mmHg. Aortic valve area, by VTI measures 2.58 cm.  Pulmonic Valve: The pulmonic valve was normal in structure. Pulmonic valve regurgitation is not visualized. No  evidence of pulmonic stenosis.  Aorta: The aortic root is normal in size and structure. There is an aneurysm involving the ascending aorta measuring 39 mm.  Venous: The inferior vena cava is normal in size with greater than 50% respiratory variability, suggesting right atrial pressure of 3 mmHg.  IAS/Shunts: No atrial level shunt detected by color flow Doppler.   LEFT VENTRICLE PLAX 2D LVIDd:         4.50 cm   Diastology LVIDs:         3.00 cm   LV e' medial:    3.48 cm/s LV PW:         1.10 cm   LV E/e' medial:  34.8 LV IVS:        1.20 cm   LV e' lateral:   6.96 cm/s LVOT diam:     2.30 cm   LV E/e' lateral: 17.4 LV SV:         78 LV SV Index:   31 LVOT Area:     4.15 cm   RIGHT VENTRICLE RV Basal diam:  4.70 cm RV Mid diam:    3.50 cm RV S prime:     13.30 cm/s TAPSE (M-mode): 2.3 cm  LEFT ATRIUM           Index        RIGHT ATRIUM           Index LA diam:      5.00 cm 2.02 cm/m   RA  Area:     21.40 cm LA Vol (A4C): 73.3 ml 29.59 ml/m  RA Volume:   58.40 ml  23.58 ml/m AORTIC VALVE AV Area (Vmax):    2.66 cm AV Area (Vmean):   2.32 cm AV Area (VTI):     2.58 cm AV Vmax:           148.00 cm/s AV Vmean:          106.000 cm/s AV VTI:            0.301 m AV Peak Grad:      8.8 mmHg AV Mean Grad:      5.0 mmHg LVOT Vmax:         94.60 cm/s LVOT Vmean:        59.100 cm/s LVOT VTI:          0.187 m LVOT/AV VTI ratio: 0.62  AORTA Ao Root diam: 3.90 cm Ao Asc diam:  3.70 cm  MITRAL VALVE MV Area (PHT): 3.53 cm     SHUNTS MV Decel Time: 215 msec     Systemic VTI:  0.19 m MV E velocity: 121.00 cm/s  Systemic Diam: 2.30 cm MV A velocity: 48.40 cm/s MV E/A ratio:  2.50  Belva Crome MD Electronically signed by Belva Crome MD Signature Date/Time: 08/02/2021/1:22:17 PM    Final          ______________________________________________________________________________________________      Risk Assessment/Calculations:    CHA2DS2-VASc Score = 4  ] This indicates a 4.8% annual risk of stroke. The patient's score is based upon: CHF History: 1 HTN History: 1 Diabetes History: 0 Stroke History: 0 Vascular Disease History: 0 Age Score: 2 Gender Score: 0           Physical Exam:   VS:  BP 122/70 (BP Location: Left Arm, Cuff Size: Normal)   Pulse 66   Ht 6\' 2"  (1.88 m)   Wt 254 lb (115.2 kg)   SpO2 92%  BMI 32.61 kg/m    Wt Readings from Last 3 Encounters:  05/29/23 254 lb (115.2 kg)  05/08/23 255 lb (115.7 kg)  03/08/23 254 lb (115.2 kg)    GEN: Well nourished, well developed in no acute distress NECK: No JVD; No carotid bruits CARDIAC: RRR, no murmurs, rubs, gallops RESPIRATORY:  Clear to auscultation without rales, wheezing or rhonchi  ABDOMEN: Soft, non-tender, non-distended EXTREMITIES:  No edema; No deformity   ASSESSMENT AND PLAN: .    Transient Visual Disturbance Approximately 1.5 weeks ago, he experienced a transient  visual disturbance with two blue dots in the corner of his right eye, lasting about 30 seconds and resolving after rubbing his eye. No recurrence of symptoms, dizziness, chest pain, shortness of breath, slurred speech, or weakness. Initially concerning for a possible transient ischemic attack (TIA), but given the transient nature and lack of recurrence, a TIA is unlikely. Full neurological examination revealed no focal deficits.  - Order transthoracic echocardiogram - Order carotid ultrasound  Chronic Diastolic Heart Failure Chronic diastolic heart failure with non-ischemic cardiomyopathy and improved ejection fraction. Last echocardiogram in 2023 showed normalized pumping function. Currently asymptomatic with no recent episodes of chest pain or shortness of breath. - Repeat echocardiogram to assess cardiac function  Atrial Fibrillation Atrial fibrillation, managed with Eliquis. He is compliant with medication regimen. No recent episodes of palpitations or arrhythmias reported. Decision made not to perform a heart monitor due to compliance with Eliquis and lack of recent symptoms. - Continue Eliquis as prescribed  Hypertension Hypertension, well-controlled with no recent episodes of elevated blood pressure reported.        Dispo: Follow-up with Dr. Jens Som in 4 to 64-month.  Signed, Azalee Course, PA

## 2023-05-29 NOTE — Progress Notes (Unsigned)
 EKG

## 2023-06-21 ENCOUNTER — Ambulatory Visit (HOSPITAL_BASED_OUTPATIENT_CLINIC_OR_DEPARTMENT_OTHER)
Admission: RE | Admit: 2023-06-21 | Discharge: 2023-06-21 | Disposition: A | Discharge status: Home / Self Care | Source: Ambulatory Visit | Attending: Physician Assistant | Admitting: Physician Assistant

## 2023-06-21 ENCOUNTER — Ambulatory Visit (HOSPITAL_BASED_OUTPATIENT_CLINIC_OR_DEPARTMENT_OTHER)
Admission: RE | Admit: 2023-06-21 | Discharge: 2023-06-21 | Discharge status: Home / Self Care | Source: Ambulatory Visit | Attending: Physician Assistant

## 2023-06-21 DIAGNOSIS — I428 Other cardiomyopathies: Secondary | ICD-10-CM | POA: Insufficient documentation

## 2023-06-21 DIAGNOSIS — H539 Unspecified visual disturbance: Secondary | ICD-10-CM | POA: Insufficient documentation

## 2023-06-25 LAB — ECHOCARDIOGRAM COMPLETE
AR max vel: 2.01 cm2
AV Area VTI: 2.01 cm2
AV Area mean vel: 1.99 cm2
AV Mean grad: 3 mmHg
AV Peak grad: 4.8 mmHg
Ao pk vel: 1.1 m/s
Area-P 1/2: 3.6 cm2
Calc EF: 66.8 %
S' Lateral: 2.6 cm
Single Plane A2C EF: 66.8 %
Single Plane A4C EF: 63.3 %

## 2023-06-26 NOTE — Progress Notes (Signed)
 Mild carotid artery disease noted.

## 2023-07-19 ENCOUNTER — Encounter: Payer: Self-pay | Admitting: Podiatry

## 2023-07-19 ENCOUNTER — Ambulatory Visit (INDEPENDENT_AMBULATORY_CARE_PROVIDER_SITE_OTHER): Admitting: Podiatry

## 2023-07-19 DIAGNOSIS — D689 Coagulation defect, unspecified: Secondary | ICD-10-CM

## 2023-07-19 DIAGNOSIS — B351 Tinea unguium: Secondary | ICD-10-CM

## 2023-07-19 DIAGNOSIS — M79676 Pain in unspecified toe(s): Secondary | ICD-10-CM | POA: Diagnosis not present

## 2023-07-19 NOTE — Progress Notes (Signed)
 This patient returns to my office for at risk foot care.  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  This patient is unable to cut nails himself since the patient cannot reach his nails.These nails are painful walking and wearing shoes.  This patient presents for at risk foot care today.  General Appearance  Alert, conversant and in no acute stress.  Vascular  Dorsalis pedis and posterior tibial  pulses are palpable  bilaterally.  Capillary return is within normal limits  bilaterally. Temperature is within normal limits  bilaterally.  Neurologic  Senn-Weinstein monofilament wire test within normal limits  bilaterally. Muscle power within normal limits bilaterally.  Nails Thick disfigured discolored nails with subungual debris  from hallux to fifth toes bilaterally. No evidence of bacterial infection or drainage bilaterally.  Great toenail left hallux is unattached.  Orthopedic  No limitations of motion  feet .  No crepitus or effusions noted.  No bony pathology or digital deformities noted.  Skin  normotropic skin with no porokeratosis noted bilaterally.  No signs of infections or ulcers noted.     Onychomycosis  Pain in right toes  Pain in left toes  Consent was obtained for treatment procedures.   Mechanical debridement of nails 1-5  bilaterally performed with a nail nipper.  Filed with dremel without incident  Fifth toenail right is unattached.  DSD applied.   Return office visit   10 weeks                  Told patient to return for periodic foot care and evaluation due to potential at risk complications.   Ruffin Cotton DPM

## 2023-08-07 NOTE — Progress Notes (Unsigned)
    Ben Jackson D.[facility] [address] [place] [address] Phone: [phone]   Assessment and Plan:     There are no diagnoses linked to this encounter.  ***   Pertinent previous records reviewed include ***    Follow Up: ***     Subjective:   I, Christan Ciccarelli, am serving as a Neurosurgeon for Doctor Ulysees Gander  Chief Complaint: left knee pain    HPI:    03/08/2023 Patient is a 83 year old male with left knee pain. Patient states left knee hx of partial replacement in the 80s. He has anterior knee pain that he is sure is bone on bone. He thinks he would like a CSI. A couple of months ago he had a pop that was sore and has since recovered. Acetaminophen  for the pain    05/08/2023 Patient states pain is 9/10. Walking slow today due to the pain.  08/08/2023 Patient states   Relevant Historical Information: Atrial fibrillation with chronic anticoagulation on Eliquis , hypertension, CHF, CKD    Additional pertinent review of systems negative.   Current Outpatient Medications:    amiodarone  (PACERONE ) 200 MG tablet, TAKE ONE (1) TABLET BY MOUTH EVERY DAY, Disp: 90 tablet, Rfl: 3   amLODipine  (NORVASC ) 5 MG tablet, Take 1 tablet (5 mg total) by mouth daily at 6 PM., Disp: , Rfl:    apixaban  (ELIQUIS ) 2.5 MG TABS tablet, Take 1 tablet (2.5 mg total) by mouth 2 (two) times daily., Disp: 180 tablet, Rfl: 3   atorvastatin  (LIPITOR) 80 MG tablet, Take 1 tablet (80 mg total) by mouth daily with breakfast., Disp: 30 tablet, Rfl: 12   calcitRIOL (ROCALTROL) 0.25 MCG capsule, TAKE ONE CAPSULE BY MOUTH DAILY (STOP TAKING CHOLECALCIFEROL), Disp: , Rfl:    clonazePAM (KLONOPIN) 0.5 MG tablet, Take 0.5 tablets by mouth 2 (two) times daily., Disp: , Rfl:    docusate sodium  (COLACE) 100 MG capsule, Take 400 mg by mouth at bedtime as needed for mild constipation. , Disp: , Rfl:    empagliflozin (JARDIANCE) 25 MG TABS tablet, TAKE ONE-HALF TABLET BY  MOUTH EVERY MORNING FOR HEART FAIURE, Disp: , Rfl:    ferrous sulfate 325 (65 FE) MG tablet, Take 325 mg by mouth as directed. TAKE 1 TAB M/W/F, Disp: , Rfl:    levalbuterol  (XOPENEX  HFA) 45 MCG/ACT inhaler, Inhale 2 puffs into the lungs every 6 (six) hours as needed for wheezing., Disp: 1 each, Rfl: 12   lisinopril  (PRINIVIL ,ZESTRIL ) 40 MG tablet, Take 40 mg by mouth daily., Disp: , Rfl:    Melatonin 3 MG CAPS, Take 3 mg by mouth at bedtime., Disp: , Rfl:    metoprolol  succinate (TOPROL -XL) 200 MG 24 hr tablet, Take 100 mg by mouth daily. Take with or immediately following a meal., Disp: 90 tablet, Rfl: 3   torsemide  (DEMADEX ) 20 MG tablet, Take 2 tablets (40 mg total) by mouth daily., Disp: 180 tablet, Rfl: 3  Current Facility-Administered Medications:    sodium chloride  flush (NS) 0.9 % injection 3 mL, 3 mL, Intravenous, Q12H, Crenshaw, Deannie Fabian, MD   Objective:     There were no vitals filed for this visit.    There is no height or weight on file to calculate BMI.    Physical Exam:    ***   Electronically signed by:  Marshall Skeeter D.[facility] 7:51 AM 08/07/23

## 2023-08-08 ENCOUNTER — Ambulatory Visit: Admitting: Sports Medicine

## 2023-08-08 VITALS — BP 132/80 | HR 89 | Ht 74.0 in | Wt 254.0 lb

## 2023-08-08 DIAGNOSIS — M159 Polyosteoarthritis, unspecified: Secondary | ICD-10-CM

## 2023-08-08 DIAGNOSIS — Z96652 Presence of left artificial knee joint: Secondary | ICD-10-CM

## 2023-08-08 DIAGNOSIS — M25562 Pain in left knee: Secondary | ICD-10-CM | POA: Diagnosis not present

## 2023-08-08 DIAGNOSIS — M175 Other unilateral secondary osteoarthritis of knee: Secondary | ICD-10-CM | POA: Diagnosis not present

## 2023-08-08 DIAGNOSIS — M1711 Unilateral primary osteoarthritis, right knee: Secondary | ICD-10-CM | POA: Diagnosis not present

## 2023-08-08 DIAGNOSIS — G8929 Other chronic pain: Secondary | ICD-10-CM

## 2023-08-08 DIAGNOSIS — M25561 Pain in right knee: Secondary | ICD-10-CM | POA: Diagnosis not present

## 2023-08-08 MED ORDER — TRIAMCINOLONE ACETONIDE 32 MG IX SRER
32.0000 mg | Freq: Once | INTRA_ARTICULAR | Status: AC
  Administered 2023-08-08: 32 mg via INTRA_ARTICULAR

## 2023-08-08 NOTE — Patient Instructions (Signed)
 Tylenol  [phone] mg 2-3 times a day for pain relief  Topical Voltaren gel over areas of pain  Recommend warm hand baths or heated gloves for hand pain  3 month follow up for repeat zilretta 

## 2023-09-27 ENCOUNTER — Ambulatory Visit: Admitting: Podiatry

## 2023-09-28 NOTE — Progress Notes (Signed)
 HPI: FU atrial fibrillation. Echo 4/15 EF 20-25%. LHC demonstrated mod non-obs CAD. Most significant lesion was a mid D1 with 75% stenosis. DCM was felt to be out of proportion to CAD (NICM). Had DCCV on amiodarone  but did not hold sinus. He was seen by Dr. Kelsie and referred to Dr. Dusty for surgical maze. Patient underwent maze procedure with clipping of left atrial appendage on October 29, 2013. CTA April 2018 showed focal saccular outpouching of the distal descending thoracic aorta with no intramural hematoma or inflammatory changes.  Findings felt possibly early ulcer formation and not a penetrating ulcer per se.  Images were reviewed by Dr. Dusty and no further follow-up recommended. Echocardiogram April 2025 showed normal LV function, grade 2 diastolic dysfunction, mildly dilated ascending aorta at 40 mm.  Carotid Dopplers April 2025 showed less than 50% bilaterally.  Since last seen, he does have some dyspnea on exertion unchanged.  No orthopnea, PND, pedal edema, chest pain or syncope.  No bleeding.  Current Outpatient Medications  Medication Sig Dispense Refill   amiodarone  (PACERONE ) 200 MG tablet TAKE ONE (1) TABLET BY MOUTH EVERY DAY 90 tablet 3   amLODipine  (NORVASC ) 5 MG tablet Take 1 tablet (5 mg total) by mouth daily at 6 PM.     apixaban  (ELIQUIS ) 2.5 MG TABS tablet Take 1 tablet (2.5 mg total) by mouth 2 (two) times daily. 180 tablet 3   atorvastatin  (LIPITOR) 80 MG tablet Take 1 tablet (80 mg total) by mouth daily with breakfast. 30 tablet 12   calcitRIOL (ROCALTROL) 0.25 MCG capsule TAKE ONE CAPSULE BY MOUTH DAILY (STOP TAKING CHOLECALCIFEROL)     clonazePAM (KLONOPIN) 0.5 MG tablet Take 0.5 tablets by mouth 2 (two) times daily.     docusate sodium  (COLACE) 100 MG capsule Take 400 mg by mouth at bedtime as needed for mild constipation.      empagliflozin (JARDIANCE) 25 MG TABS tablet TAKE ONE-HALF TABLET BY MOUTH EVERY MORNING FOR HEART FAIURE     ferrous sulfate 325 (65 FE) MG  tablet Take 325 mg by mouth as directed. TAKE 1 TAB M/W/F     levalbuterol  (XOPENEX  HFA) 45 MCG/ACT inhaler Inhale 2 puffs into the lungs every 6 (six) hours as needed for wheezing. 1 each 12   lisinopril  (PRINIVIL ,ZESTRIL ) 40 MG tablet Take 40 mg by mouth daily.     Melatonin 3 MG CAPS Take 3 mg by mouth at bedtime.     metoprolol  succinate (TOPROL -XL) 200 MG 24 hr tablet Take 100 mg by mouth daily. Take with or immediately following a meal. 90 tablet 3   torsemide  (DEMADEX ) 20 MG tablet Take 2 tablets (40 mg total) by mouth daily. 180 tablet 3   Current Facility-Administered Medications  Medication Dose Route Frequency Provider Last Rate Last Admin   sodium chloride  flush (NS) 0.9 % injection 3 mL  3 mL Intravenous Q12H Pietro Redell RAMAN, MD         Past Medical History:  Diagnosis Date   Anxiety    Arthritis    R hand- OA   Atrial Fibrillation  06/25/2013   CAD (coronary artery disease), native coronary artery 06/28/2013   a. LHC (06/27/13):  Mid LAD 40%, mid D1 75%, mid CFX 50-60%, mid RCA 50%, EF 20%, LVEDP 24 mmHg.   CHF (congestive heart failure) (HCC)    Chronic combined systolic and diastolic heart failure (HCC) 06/26/2013   a.  Echo (06/25/13):  EF 20-25%, diff HK, restrictive physio, mild  MR, mod to severe LAE, mild RVE, mildly reduced RVSF, mod to severe RAE   Chronic kidney disease    Dyslipidemia 07/15/2013   Dysrhythmia    a-fib   GERD (gastroesophageal reflux disease)    Hypertension    Incidental pulmonary nodule 06/22/2014   Seen on CT angiogram of chest   NICM (nonischemic cardiomyopathy) with EF 20-25% 06/26/2013   Obesity    Penetrating atherosclerotic ulcer of aorta (HCC) 10/22/2013   Small penetrating ulcer of descending thoracic aorta discovered on routine CTA   Prostate cancer (HCC)    watchful waiting, no treatment so far   S/P Maze operation for atrial fibrillation 10/29/2013   Complete bilateral atrial lesion set using cryothermy and bipolar radiofrequency  ablation with clipping of LA appendage via right mini thoracotomy   Shortness of breath    seen by Pietro, & low energy     Past Surgical History:  Procedure Laterality Date   CARDIAC CATHETERIZATION  06/2013   CARDIOVERSION N/A 06/30/2013   Procedure: CARDIOVERSION;  Surgeon: Wilbert JONELLE Bihari, MD;  Location: [facility] ENDOSCOPY;  Service: Cardiovascular;  Laterality: N/A;   CARDIOVERSION N/A 07/31/2013   Procedure: CARDIOVERSION;  Surgeon: Aleene JINNY Passe, MD;  Location: [facility] ENDOSCOPY;  Service: Cardiovascular;  Laterality: N/A;   CARDIOVERSION N/A 03/19/2018   Procedure: CARDIOVERSION;  Surgeon: Bihari Wilbert JONELLE, MD;  Location: [facility] ENDOSCOPY;  Service: Cardiovascular;  Laterality: N/A;   CARDIOVERSION N/A 07/24/2018   Procedure: CARDIOVERSION;  Surgeon: Bihari Wilbert JONELLE, MD;  Location: [facility] ENDOSCOPY;  Service: Cardiovascular;  Laterality: N/A;   CLIPPING OF ATRIAL APPENDAGE  10/29/2013   Procedure: CLIPPING OF ATRIAL APPENDAGE;  Surgeon: Sudie VEAR Laine, MD;  Location: [facility] OR;  Service: Open Heart Surgery;;   INTRAOPERATIVE TRANSESOPHAGEAL ECHOCARDIOGRAM N/A 10/29/2013   Procedure: INTRAOPERATIVE TRANSESOPHAGEAL ECHOCARDIOGRAM;  Surgeon: Sudie VEAR Laine, MD;  Location: [facility] OR;  Service: Open Heart Surgery;  Laterality: N/A;   LEFT AND RIGHT HEART CATHETERIZATION WITH CORONARY ANGIOGRAM N/A 06/27/2013   Procedure: LEFT AND RIGHT HEART CATHETERIZATION WITH CORONARY ANGIOGRAM;  Surgeon: Candyce GORMAN Reek, MD;  Location: [facility] CATH LAB;  Service: Cardiovascular;  Laterality: N/A;   MEDIAL PARTIAL KNEE REPLACEMENT Left 1990's?   MINIMALLY INVASIVE MAZE PROCEDURE N/A 10/29/2013   Procedure: MINIMALLY INVASIVE MAZE PROCEDURE;  Surgeon: Sudie VEAR Laine, MD;  Location: [facility] OR;  Service: Open Heart Surgery;  Laterality: N/A;   TEE WITHOUT CARDIOVERSION N/A 06/30/2013   Procedure: TRANSESOPHAGEAL ECHOCARDIOGRAM (TEE) ;  Surgeon: Wilbert JONELLE Bihari, MD;  Location: [facility] ENDOSCOPY;  Service: Cardiovascular;  Laterality: N/A;    TRANSURETHRAL RESECTION OF PROSTATE  2010    Social History   Socioeconomic History   Marital status: Widowed    Spouse name: Not on file   Number of children: Not on file   Years of education: Not on file   Highest education level: Not on file  Occupational History   Not on file  Tobacco Use   Smoking status: Never   Smokeless tobacco: Never   Tobacco comments:    06/25/2013 smoked very light; years and years ago  Vaping Use   Vaping status: Never Used  Substance and Sexual Activity   Alcohol use: Yes    Alcohol/week: 14.0 standard drinks of alcohol    Types: 7 Glasses of wine, 7 Shots of liquor per week   Drug use: No   Sexual activity: Not Currently  Other Topics Concern   Not on file  Social History Narrative   He  lives in [place]. He is widowed. Wife passed of breast cancer last year. No children.    Social Drivers of Corporate investment banker Strain: Not on file  Food Insecurity: Not on file  Transportation Needs: Not on file  Physical Activity: Not on file  Stress: Not on file  Social Connections: Not on file  Intimate Partner Violence: Not on file    Family History  Problem Relation Age of Onset   Healthy Mother    Healthy Father    Healthy Sister    Lung cancer Neg Hx    Lung disease Neg Hx     ROS: no fevers or chills, productive cough, hemoptysis, dysphasia, odynophagia, melena, hematochezia, dysuria, hematuria, rash, seizure activity, orthopnea, PND, pedal edema, claudication. Remaining systems are negative.  Physical Exam: Well-developed well-nourished in no acute distress.  Skin is warm and dry.  HEENT is normal.  Neck is supple.  Chest is clear to auscultation with normal expansion.  Cardiovascular exam is regular rate and rhythm.  Abdominal exam nontender or distended. No masses palpated. Extremities show no edema. neuro grossly intact  A/P  1 paroxysmal atrial fibrillation-patient is in sinus rhythm today.  Will continue  amiodarone  and apixaban .  Will obtain most recent hemoglobin, renal function, TSH, liver functions and chest x-ray from the [facility].  2 history of nonischemic cardiomyopathy-most recent echocardiogram showed normalization of LV function reduction likely secondary to tachycardia.  Continue ACE inhibitor and beta-blocker.  3 chronic diastolic congestive heart failure-continue Demadex  and Jardiance at present dose.  Will obtain most recent BMET from the [facility].  4 hypertension-patient's blood pressure is controlled.  Continue present medical regimen.  5 hyperlipidemia-continue statin.  Will obtain most recent lipids and liver from the [facility].  6 coronary artery disease-continue statin.  He denies chest pain.  7 chronic stage IIIb kidney disease  8 history of penetrating atherosclerotic ulcer-continue statin.  Previously reviewed by cardiothoracic surgery and medical therapy recommended without need for follow-up imaging.  Redell Shallow, MD

## 2023-10-05 ENCOUNTER — Ambulatory Visit: Admitting: Podiatry

## 2023-10-05 ENCOUNTER — Encounter: Payer: Self-pay | Admitting: Podiatry

## 2023-10-05 DIAGNOSIS — M79676 Pain in unspecified toe(s): Secondary | ICD-10-CM | POA: Diagnosis not present

## 2023-10-05 DIAGNOSIS — B351 Tinea unguium: Secondary | ICD-10-CM

## 2023-10-05 DIAGNOSIS — D689 Coagulation defect, unspecified: Secondary | ICD-10-CM | POA: Diagnosis not present

## 2023-10-05 DIAGNOSIS — L0231 Cutaneous abscess of buttock: Secondary | ICD-10-CM | POA: Insufficient documentation

## 2023-10-05 DIAGNOSIS — N183 Chronic kidney disease, stage 3 unspecified: Secondary | ICD-10-CM | POA: Diagnosis not present

## 2023-10-05 DIAGNOSIS — Z48810 Encounter for surgical aftercare following surgery on the sense organs: Secondary | ICD-10-CM | POA: Insufficient documentation

## 2023-10-05 DIAGNOSIS — H2513 Age-related nuclear cataract, bilateral: Secondary | ICD-10-CM | POA: Insufficient documentation

## 2023-10-05 NOTE — Progress Notes (Signed)
 This patient returns to my office for at risk foot care.  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  This patient is unable to cut nails himself since the patient cannot reach his nails.These nails are painful walking and wearing shoes.  This patient presents for at risk foot care today.  General Appearance  Alert, conversant and in no acute stress.  Vascular  Dorsalis pedis and posterior tibial  pulses are palpable  bilaterally.  Capillary return is within normal limits  bilaterally. Temperature is within normal limits  bilaterally.  Neurologic  Senn-Weinstein monofilament wire test within normal limits  bilaterally. Muscle power within normal limits bilaterally.  Nails Thick disfigured discolored nails with subungual debris  from hallux to fifth toes bilaterally. No evidence of bacterial infection or drainage bilaterally. Fourth toenail left foot is unattached.  Second toe left has self avulsed.  Orthopedic  No limitations of motion  feet .  No crepitus or effusions noted.  No bony pathology or digital deformities noted.  Skin  normotropic skin with no porokeratosis noted bilaterally.  No signs of infections or ulcers noted.     Onychomycosis  Pain in right toes  Pain in left toes  Consent was obtained for treatment procedures.   Mechanical debridement of nails 1-5  bilaterally performed with a nail nipper.  Filed with dremel without incident  Fourth toe left foot is bandaged.   Return office visit   10 weeks                  Told patient to return for periodic foot care and evaluation due to potential at risk complications.   Cordella Bold DPM

## 2023-10-10 ENCOUNTER — Encounter: Payer: Self-pay | Admitting: Cardiology

## 2023-10-10 ENCOUNTER — Ambulatory Visit: Attending: Cardiology | Admitting: Cardiology

## 2023-10-10 VITALS — BP 140/60 | HR 85 | Ht 74.0 in | Wt 248.0 lb

## 2023-10-10 DIAGNOSIS — E7800 Pure hypercholesterolemia, unspecified: Secondary | ICD-10-CM

## 2023-10-10 DIAGNOSIS — I5032 Chronic diastolic (congestive) heart failure: Secondary | ICD-10-CM | POA: Insufficient documentation

## 2023-10-10 DIAGNOSIS — I428 Other cardiomyopathies: Secondary | ICD-10-CM | POA: Diagnosis present

## 2023-10-10 DIAGNOSIS — I10 Essential (primary) hypertension: Secondary | ICD-10-CM

## 2023-10-10 DIAGNOSIS — I719 Aortic aneurysm of unspecified site, without rupture: Secondary | ICD-10-CM | POA: Diagnosis present

## 2023-10-10 DIAGNOSIS — I2510 Atherosclerotic heart disease of native coronary artery without angina pectoris: Secondary | ICD-10-CM | POA: Diagnosis present

## 2023-10-10 DIAGNOSIS — I480 Paroxysmal atrial fibrillation: Secondary | ICD-10-CM | POA: Diagnosis not present

## 2023-10-10 NOTE — Patient Instructions (Signed)

## 2023-11-08 NOTE — Progress Notes (Unsigned)
 Jose Snyder [facility] [address] [place] [address] Phone: [phone]   Assessment and Plan:     1. Chronic pain of left knee (Primary) 2. Other secondary osteoarthritis of left knee 3. Status post left partial knee replacement 4. Primary osteoarthritis of right knee 5. Chronic pain of right knee -Chronic with exacerbation, subsequent visit - Consistent with recurrent flare of bilateral osteoarthritis - Patient has received significant relief with Zilretta  injections lasting nearly 3 months.  Elected for bilateral Zilretta  injection today.  Tolerated well per note below.  Patient had increased bleeding risk due to chronic anticoagulation on Eliquis .  Patient has partial knee replacement and left knee and is aware of higher risks associated with intra-articular injections status post partial knee replacement. - Recommend continuing Tylenol  for day-to-day pain relief - Do not recommend NSAIDs due to chronic anticoagulation on Eliquis  -Since Zilretta  injections were offered nearly 2 months time, we will order prior authorization for HA injections that could be used in between Zilretta  injections for prolonged pain relief. - Patient requested refill of old prescription hydrocodone .  Advised that I do not recommend hydrocodone  due to addictive effects, drowsiness, dizziness, increasing risk of falls.  Patient still requesting medication for severe breakthrough pain, so we agreed to use tramadol  50 mg as needed for severe breakthrough pain.  Prescription sent to pharmacy  Procedure: Knee Joint Injection Side: Bilateral Indication: Flare of osteoarthritis   Risks explained and consent was given verbally. The site was cleaned with alcohol prep. A needle was introduced with an anterio-lateral approach. Injection given using Zilretta  32 mg.  This was well tolerated and resulted in symptomatic relief.  Needle was removed, hemostasis achieved, and post  injection instructions were explained.  Procedure was repeated on contralateral side.  Pt was advised to call or return to clinic if these symptoms worsen or fail to improve as anticipated.     Pertinent previous records reviewed include none   Follow Up: 3 months for reevaluation.  Could consider repeat  Zilretta  injections to bilateral knees   Subjective:   I, Jose Snyder, am serving as a Neurosurgeon for Doctor Morene Mace  Chief Complaint: left knee pain    HPI:    03/08/2023 Patient is a 83 year old male with left knee pain. Patient states left knee hx of partial replacement in the 80s. He has anterior knee pain that he is sure is bone on bone. He thinks he would like a CSI. A couple of months ago he had a pop that was sore and has since recovered. Acetaminophen  for the pain    05/08/2023 Patient states pain is 9/10. Walking slow today due to the pain.   08/08/2023 Patient states he was doing well up until 2 weeks ago and he got sore and tight   11/09/2023 Patient states he is really ready for zilretta  this time they lasted  2 months   Relevant Historical Information: Atrial fibrillation with chronic anticoagulation on Eliquis , hypertension, CHF, CKD  Additional pertinent review of systems negative.   Current Outpatient Medications:    amiodarone  (PACERONE ) 200 MG tablet, TAKE ONE (1) TABLET BY MOUTH EVERY DAY, Disp: 90 tablet, Rfl: 3   amLODipine  (NORVASC ) 5 MG tablet, Take 1 tablet (5 mg total) by mouth daily at 6 PM., Disp: , Rfl:    apixaban  (ELIQUIS ) 2.5 MG TABS tablet, Take 1 tablet (2.5 mg total) by mouth 2 (two) times daily., Disp: 180 tablet, Rfl: 3  atorvastatin  (LIPITOR) 80 MG tablet, Take 1 tablet (80 mg total) by mouth daily with breakfast., Disp: 30 tablet, Rfl: 12   calcitRIOL (ROCALTROL) 0.25 MCG capsule, TAKE ONE CAPSULE BY MOUTH DAILY (STOP TAKING CHOLECALCIFEROL), Disp: , Rfl:    clonazePAM (KLONOPIN) 0.5 MG tablet, Take 0.5 tablets by mouth 2 (two) times  daily., Disp: , Rfl:    docusate sodium  (COLACE) 100 MG capsule, Take 400 mg by mouth at bedtime as needed for mild constipation. , Disp: , Rfl:    empagliflozin (JARDIANCE) 25 MG TABS tablet, TAKE ONE-HALF TABLET BY MOUTH EVERY MORNING FOR HEART FAIURE, Disp: , Rfl:    ferrous sulfate 325 (65 FE) MG tablet, Take 325 mg by mouth as directed. TAKE 1 TAB M/W/F, Disp: , Rfl:    levalbuterol  (XOPENEX  HFA) 45 MCG/ACT inhaler, Inhale 2 puffs into the lungs every 6 (six) hours as needed for wheezing., Disp: 1 each, Rfl: 12   lisinopril  (PRINIVIL ,ZESTRIL ) 40 MG tablet, Take 40 mg by mouth daily., Disp: , Rfl:    Melatonin 3 MG CAPS, Take 3 mg by mouth at bedtime., Disp: , Rfl:    metoprolol  succinate (TOPROL -XL) 200 MG 24 hr tablet, Take 100 mg by mouth daily. Take with or immediately following a meal., Disp: 90 tablet, Rfl: 3   nortriptyline (PAMELOR) 10 MG capsule, Take 20 mg by mouth., Disp: , Rfl:    torsemide  (DEMADEX ) 20 MG tablet, Take 2 tablets (40 mg total) by mouth daily., Disp: 180 tablet, Rfl: 3  Current Facility-Administered Medications:    sodium chloride  flush (NS) 0.9 % injection 3 mL, 3 mL, Intravenous, Q12H, Pietro Redell RAMAN, MD   Objective:     Vitals:   11/09/23 0832  Weight: 248 lb (112.5 kg)  Height: 6' 2 (1.88 m)      Body mass index is 31.84 kg/m.    Physical Exam:    General:  awake, alert oriented, no acute distress nontoxic Skin: no suspicious lesions or rashes Neuro:sensation intact and strength 5/5 with no deficits, no atrophy, normal muscle tone Psych: No signs of anxiety, depression or other mood disorder   Bilateral knee: Moderate swelling Well-healed surgical incision scar on left knee No deformity Positive fluid wave, joint milking ROM Flex 100, Ext 5 TTP medial femoral condyle, medial patella, medial joint line NTTP over the quad tendon,   lat fem condyle,  patella tendon, tibial tuberostiy, fibular head, posterior fossa, pes anserine bursa, gerdy's  tubercle,   lateral jt line Neg anterior and posterior drawer Neg lachman Neg sag sign Negative varus stress Negative valgus stress   Gait antalgic, slow steps.  Using 1 pronged cane for support    Electronically signed by:  Odis Mace Snyder [facility] 8:38 AM 11/09/23

## 2023-11-09 ENCOUNTER — Telehealth: Payer: Self-pay | Admitting: Sports Medicine

## 2023-11-09 ENCOUNTER — Ambulatory Visit: Admitting: Sports Medicine

## 2023-11-09 VITALS — HR 98 | Ht 74.0 in | Wt 248.0 lb

## 2023-11-09 DIAGNOSIS — M170 Bilateral primary osteoarthritis of knee: Secondary | ICD-10-CM

## 2023-11-09 DIAGNOSIS — M25561 Pain in right knee: Secondary | ICD-10-CM | POA: Diagnosis not present

## 2023-11-09 DIAGNOSIS — M1711 Unilateral primary osteoarthritis, right knee: Secondary | ICD-10-CM

## 2023-11-09 DIAGNOSIS — M25562 Pain in left knee: Secondary | ICD-10-CM

## 2023-11-09 DIAGNOSIS — Z96652 Presence of left artificial knee joint: Secondary | ICD-10-CM | POA: Diagnosis not present

## 2023-11-09 DIAGNOSIS — G8929 Other chronic pain: Secondary | ICD-10-CM

## 2023-11-09 DIAGNOSIS — M175 Other unilateral secondary osteoarthritis of knee: Secondary | ICD-10-CM

## 2023-11-09 MED ORDER — TRAMADOL HCL 50 MG PO TABS: 50.0000 mg | ORAL_TABLET | Freq: Three times a day (TID) | ORAL | 0 refills | Status: AC | PRN

## 2023-11-09 NOTE — Patient Instructions (Addendum)
 Bilat gel approval   Tylenol  [phone] mg 2-3 times a day for pain relief   Follow up sooner for gel shots   May use tramadol  50 mg as needed daily for breakthrough pain   3 month follow up for repeat zilretta 

## 2023-11-09 NOTE — Telephone Encounter (Signed)
 Ran benefits for bilateral Monovisc Case [id number]

## 2023-11-09 NOTE — Telephone Encounter (Signed)
 Bilat HA or gel

## 2023-11-12 MED ORDER — TRIAMCINOLONE ACETONIDE 32 MG IX SRER
32.0000 mg | Freq: Once | INTRA_ARTICULAR | Status: AC
  Administered 2023-11-12: 32 mg via INTRA_ARTICULAR

## 2023-11-12 NOTE — Telephone Encounter (Signed)
 Can you please schedule patient when medication is stocked thank you   Monovisc authorized for bilateral knee MEDICARE PRE CERT NOT REQUIRED  Coinsurance 80% Cop[place] and OOP does not apply Deductible $257 hs met $257 Reference # [id number]  [facility] affair  Not covered under medical plan  Reference # [id number]

## 2023-11-12 NOTE — Addendum Note (Signed)
 Addended by: MAYBELL ALSTON R on: 11/12/2023 10:33 AM   Modules accepted: Orders

## 2023-11-13 NOTE — Telephone Encounter (Signed)
 Patient will let us  know if and when he would like to do these.

## 2023-12-17 ENCOUNTER — Telehealth: Payer: Self-pay | Admitting: Sports Medicine

## 2023-12-17 NOTE — Telephone Encounter (Signed)
 Patient called and stated that he saw an article and saw on news 2 last night that [place] has started using low dose radiation for arthritic treatment. He would like to talk to someone about that. He would like someone to call and discuss if that is something suitable for him. He would be able to speak on the phone tomorrow but not today. He can also message on mychart but would like a phone call. Please advise.

## 2023-12-27 ENCOUNTER — Encounter: Payer: Self-pay | Admitting: Podiatry

## 2023-12-27 ENCOUNTER — Ambulatory Visit (INDEPENDENT_AMBULATORY_CARE_PROVIDER_SITE_OTHER): Admitting: Podiatry

## 2023-12-27 DIAGNOSIS — N183 Chronic kidney disease, stage 3 unspecified: Secondary | ICD-10-CM | POA: Diagnosis not present

## 2023-12-27 DIAGNOSIS — D689 Coagulation defect, unspecified: Secondary | ICD-10-CM | POA: Diagnosis not present

## 2023-12-27 DIAGNOSIS — M79676 Pain in unspecified toe(s): Secondary | ICD-10-CM | POA: Diagnosis not present

## 2023-12-27 DIAGNOSIS — B351 Tinea unguium: Secondary | ICD-10-CM | POA: Diagnosis not present

## 2023-12-27 NOTE — Patient Instructions (Signed)
 This patient returns to my office for at risk foot care.  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  This patient is unable to cut nails himself since the patient cannot reach his nails.These nails are painful walking and wearing shoes.  This patient presents for at risk foot care today.  General Appearance  Alert, conversant and in no acute stress.  Vascular  Dorsalis pedis and posterior tibial  pulses are palpable  bilaterally.  Capillary return is within normal limits  bilaterally. Temperature is within normal limits  bilaterally.  Neurologic  Senn-Weinstein monofilament wire test within normal limits  bilaterally. Muscle power within normal limits bilaterally.  Nails Thick disfigured discolored nails with subungual debris  from hallux to fifth toes bilaterally. No evidence of bacterial infection or drainage bilaterally.   Orthopedic  No limitations of motion  feet .  No crepitus or effusions noted.  No bony pathology or digital deformities noted.  Skin  normotropic skin with no porokeratosis noted bilaterally.  No signs of infections or ulcers noted.     Onychomycosis  Pain in right toes  Pain in left toes  Consent was obtained for treatment procedures.   Mechanical debridement of nails 1-5  bilaterally performed with a nail nipper.  Filed with dremel without incident   Proximal nail great toe left foot has necrotic tissue at proximal nail.  DSD applied after cauterization.   Return office visit   10 weeks                  Told patient to return for periodic foot care and evaluation due to potential at risk complications.   Cordella Bold DPM

## 2023-12-27 NOTE — Progress Notes (Signed)
 This patient returns to my office for at risk foot care.  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  This patient is unable to cut nails himself since the patient cannot reach his nails.These nails are painful walking and wearing shoes.  This patient presents for at risk foot care today.  General Appearance  Alert, conversant and in no acute stress.  Vascular  Dorsalis pedis and posterior tibial  pulses are palpable  bilaterally.  Capillary return is within normal limits  bilaterally. Temperature is within normal limits  bilaterally.  Neurologic  Senn-Weinstein monofilament wire test within normal limits  bilaterally. Muscle power within normal limits bilaterally.  Nails Thick disfigured discolored nails with subungual debris  from hallux to fifth toes bilaterally. No evidence of bacterial infection or drainage bilaterally.   Orthopedic  No limitations of motion  feet .  No crepitus or effusions noted.  No bony pathology or digital deformities noted.  Skin  normotropic skin with no porokeratosis noted bilaterally.  No signs of infections or ulcers noted.     Onychomycosis  Pain in right toes  Pain in left toes  Consent was obtained for treatment procedures.   Mechanical debridement of nails 1-5  bilaterally performed with a nail nipper.  Filed with dremel without incident  Proximal nail left hallux was resected and site was cauterized.  DSD applied.   Return office visit   10 weeks                  Told patient to return for periodic foot care and evaluation due to potential at risk complications.   Cordella Bold DPM

## 2024-01-10 ENCOUNTER — Other Ambulatory Visit: Payer: Self-pay | Admitting: Cardiology

## 2024-01-10 DIAGNOSIS — I480 Paroxysmal atrial fibrillation: Secondary | ICD-10-CM

## 2024-01-16 ENCOUNTER — Telehealth: Payer: Self-pay | Admitting: Sports Medicine

## 2024-01-16 NOTE — Telephone Encounter (Signed)
 Patient called to get scheduled for injections in both knees that he got last time. Please advise when patient is able to get scheduled. Please advise.

## 2024-01-18 NOTE — Telephone Encounter (Signed)
 Scheduled

## 2024-01-21 NOTE — Telephone Encounter (Signed)
 Patient will be fine for his scheduled appointment, but re-ran benefits for updated numbers

## 2024-02-01 NOTE — Telephone Encounter (Signed)
 Patient scheduled for 02/12/2024  Zilretta  authorized for bilateral knee NO PRE CERT REQUIRED Patient responsible for 20% coinsurance Deductible $257 has met $257 OOP does not apply Reference # website 01/21/2024

## 2024-02-01 NOTE — Telephone Encounter (Signed)
 Noted

## 2024-02-11 NOTE — Progress Notes (Unsigned)
 Jose Jackson D.[facility] [address] [place] [address] Phone: [phone]   Assessment and Plan:     1. Chronic pain of left knee (Primary) 2. Other secondary osteoarthritis of left knee 3. Primary osteoarthritis of right knee 4. Chronic pain of right knee 5.  Partial left knee replacement - Chronic with exacerbation, subsequent visit - Consistent with recurrent flare of bilateral knee osteoarthritis - Patient has been receiving 1 to 3 months relief from Zilretta  injections.  Most recent Zilretta  injection on 11/09/2023.  Patient elected for repeat bilateral Zilretta  injection today.  Tolerated well per note below.  Patient increased bleeding risk due to chronic anticoagulation on Eliquis .  Patient has partial knee replacement in left knee and is aware of higher risks associated with intra-articular injections status post partial knee replacement - Use Tylenol  500 to 1000 mg tablets 2-3 times a day for day-to-day pain relief - Use tramadol  50 mg every 8 hours as needed for severe breakthrough pain.  Patient educated on potential drowsiness with medication use - Do not recommend NSAIDs due to chronic anticoagulation on Eliquis   Procedure: Knee Joint Injection Side: Bilateral Indication: Care of osteoarthritis  Risks explained and consent was given verbally. The site was cleaned with alcohol prep. A needle was introduced with an anterio-lateral approach. Injection given using Zilretta  32 mg.  This was well tolerated.  Needle was removed, hemostasis achieved, and post injection instructions were explained.  Procedure was repeated on contralateral side.  Pt was advised to call or return to clinic if these symptoms worsen or fail to improve as anticipated.     Pertinent previous records reviewed include none   Follow Up: 3 months for reevaluation.  Could repeat Zilretta  injections.  If pain returns before, could obtain prior authorization for HA  injections   Subjective:   I, Jose Snyder, am serving as a neurosurgeon for Doctor Morene Mace  Chief Complaint: left knee pain    HPI:    03/08/2023 Patient is a 83 year old male with left knee pain. Patient states left knee hx of partial replacement in the 80s. He has anterior knee pain that he is sure is bone on bone. He thinks he would like a CSI. A couple of months ago he had a pop that was sore and has since recovered. Acetaminophen  for the pain    05/08/2023 Patient states pain is 9/10. Walking slow today due to the pain.   08/08/2023 Patient states he was doing well up until 2 weeks ago and he got sore and tight    11/09/2023 Patient states he is really ready for zilretta  this time they lasted  2 months   02/12/2024 Patient states zilretta  bilat. 4-6 weeks of relief , would like to discuss pain medication    Relevant Historical Information: Atrial fibrillation with chronic anticoagulation on Eliquis , hypertension, CHF, CKD  Additional pertinent review of systems negative.  Current Medications[1]   Objective:     Vitals:   02/12/24 0927  Pulse: 87  SpO2: 97%  Weight: 248 lb (112.5 kg)  Height: 6' 2 (1.88 m)      Body mass index is 31.84 kg/m.    Physical Exam:    General:  awake, alert oriented, no acute distress nontoxic Skin: no suspicious lesions or rashes Neuro:sensation intact and strength 5/5 with no deficits, no atrophy, normal muscle tone Psych: No signs of anxiety, depression or other mood disorder   Bilateral knee: Moderate swelling Well-healed  surgical incision scar on left knee No deformity Positive fluid wave, joint milking ROM Flex 100, Ext 5 TTP medial femoral condyle, medial patella, medial joint line NTTP over the quad tendon,   lat fem condyle,  patella tendon, tibial tuberostiy, fibular head, posterior fossa, pes anserine bursa, gerdy's tubercle,   lateral jt line Neg anterior and posterior drawer Neg lachman Neg sag sign Negative  varus stress Negative valgus stress   Gait antalgic, slow steps.  Using 1 pronged cane for support    Electronically signed by:  Odis Mace D.[facility] 9:35 AM 02/12/2024     [1]  Current Outpatient Medications:    amiodarone  (PACERONE ) 200 MG tablet, TAKE ONE (1) TABLET BY MOUTH EVERY DAY, Disp: 90 tablet, Rfl: 2   amLODipine  (NORVASC ) 5 MG tablet, Take 1 tablet (5 mg total) by mouth daily at 6 PM., Disp: , Rfl:    apixaban  (ELIQUIS ) 2.5 MG TABS tablet, Take 1 tablet (2.5 mg total) by mouth 2 (two) times daily., Disp: 180 tablet, Rfl: 3   atorvastatin  (LIPITOR) 80 MG tablet, Take 1 tablet (80 mg total) by mouth daily with breakfast., Disp: 30 tablet, Rfl: 12   calcitRIOL (ROCALTROL) 0.25 MCG capsule, TAKE ONE CAPSULE BY MOUTH DAILY (STOP TAKING CHOLECALCIFEROL), Disp: , Rfl:    clonazePAM (KLONOPIN) 0.5 MG tablet, Take 0.5 tablets by mouth 2 (two) times daily., Disp: , Rfl:    docusate sodium  (COLACE) 100 MG capsule, Take 400 mg by mouth at bedtime as needed for mild constipation. , Disp: , Rfl:    empagliflozin (JARDIANCE) 25 MG TABS tablet, TAKE ONE-HALF TABLET BY MOUTH EVERY MORNING FOR HEART FAIURE, Disp: , Rfl:    ferrous sulfate 325 (65 FE) MG tablet, Take 325 mg by mouth as directed. TAKE 1 TAB M/W/F, Disp: , Rfl:    levalbuterol  (XOPENEX  HFA) 45 MCG/ACT inhaler, Inhale 2 puffs into the lungs every 6 (six) hours as needed for wheezing., Disp: 1 each, Rfl: 12   lisinopril  (PRINIVIL ,ZESTRIL ) 40 MG tablet, Take 40 mg by mouth daily., Disp: , Rfl:    Melatonin 3 MG CAPS, Take 3 mg by mouth at bedtime., Disp: , Rfl:    metoprolol  succinate (TOPROL -XL) 200 MG 24 hr tablet, Take 100 mg by mouth daily. Take with or immediately following a meal., Disp: 90 tablet, Rfl: 3   nortriptyline (PAMELOR) 10 MG capsule, Take 20 mg by mouth., Disp: , Rfl:    torsemide  (DEMADEX ) 20 MG tablet, Take 2 tablets (40 mg total) by mouth daily., Disp: 180 tablet, Rfl: 3  Current  Facility-Administered Medications:    sodium chloride  flush (NS) 0.9 % injection 3 mL, 3 mL, Intravenous, Q12H, Crenshaw, Redell RAMAN, MD

## 2024-02-12 ENCOUNTER — Ambulatory Visit: Admitting: Sports Medicine

## 2024-02-12 ENCOUNTER — Telehealth: Payer: Self-pay | Admitting: Sports Medicine

## 2024-02-12 VITALS — HR 87 | Ht 74.0 in | Wt 248.0 lb

## 2024-02-12 DIAGNOSIS — M175 Other unilateral secondary osteoarthritis of knee: Secondary | ICD-10-CM

## 2024-02-12 DIAGNOSIS — Z96652 Presence of left artificial knee joint: Secondary | ICD-10-CM

## 2024-02-12 DIAGNOSIS — G8929 Other chronic pain: Secondary | ICD-10-CM

## 2024-02-12 DIAGNOSIS — M1711 Unilateral primary osteoarthritis, right knee: Secondary | ICD-10-CM

## 2024-02-12 DIAGNOSIS — M25562 Pain in left knee: Secondary | ICD-10-CM | POA: Diagnosis not present

## 2024-02-12 MED ORDER — TRAMADOL HCL 50 MG PO TABS: 50.0000 mg | ORAL_TABLET | Freq: Three times a day (TID) | ORAL | 0 refills | Status: AC | PRN

## 2024-02-12 MED ORDER — TRIAMCINOLONE ACETONIDE 32 MG IX SRER
32.0000 mg | Freq: Once | INTRA_ARTICULAR | Status: AC
  Administered 2024-02-12: 10:00:00 32 mg via INTRA_ARTICULAR

## 2024-02-12 NOTE — Telephone Encounter (Signed)
 Bilat HA and zilretta  2026

## 2024-02-29 ENCOUNTER — Telehealth: Payer: Self-pay

## 2024-02-29 NOTE — Telephone Encounter (Signed)
 Zilretta  benefits ran for bilateral knee case ID [id number]  [employer] benefits ran for bilateral knee case ID [id number]  [employer] an be had anytime  Zilretta  must wait until 05/13/24 or after   Patient is scheduled for 05/13/24

## 2024-03-06 ENCOUNTER — Ambulatory Visit: Admitting: Podiatry

## 2024-03-07 NOTE — Telephone Encounter (Signed)
 Scheduled 05/13/24  Zilretta  authorized for bilateral knee NO PRE CERT REQUIRED Patient responsible for 20% coinsurance Deductible $283 has met $0 OOP does not apply Deductible must be met before coverage applies  Reference # website 02/29/24  [employer] authorized for bilateral knee MEDICARE PRE CERT NOT REQUIRED  Coinsurance 80% [place] and OOP does not apply Deductible $283 hs met $0 Deductible must be met before coverage applies Reference # [id number]  [facility] affair  Not covered under medical plan  Reference # [id number]

## 2024-03-10 NOTE — Telephone Encounter (Signed)
 noted

## 2024-03-14 NOTE — Telephone Encounter (Signed)
 Documented in another encounter.

## 2024-05-13 ENCOUNTER — Ambulatory Visit: Admitting: Sports Medicine
